# Patient Record
Sex: Female | Born: 1965
Health system: Southern US, Community
[De-identification: ages and names within clinical notes are randomized; demographics above are authoritative.]

## PROBLEM LIST (undated history)

## (undated) DIAGNOSIS — F32A Depression, unspecified: Secondary | ICD-10-CM

## (undated) DIAGNOSIS — E669 Obesity, unspecified: Secondary | ICD-10-CM

## (undated) DIAGNOSIS — I509 Heart failure, unspecified: Secondary | ICD-10-CM

## (undated) DIAGNOSIS — F988 Other specified behavioral and emotional disorders with onset usually occurring in childhood and adolescence: Secondary | ICD-10-CM

## (undated) DIAGNOSIS — R778 Other specified abnormalities of plasma proteins: Secondary | ICD-10-CM

## (undated) DIAGNOSIS — I10 Essential (primary) hypertension: Secondary | ICD-10-CM

## (undated) DIAGNOSIS — J40 Bronchitis, not specified as acute or chronic: Secondary | ICD-10-CM

## (undated) DIAGNOSIS — E119 Type 2 diabetes mellitus without complications: Secondary | ICD-10-CM

## (undated) DIAGNOSIS — R7989 Other specified abnormal findings of blood chemistry: Secondary | ICD-10-CM

## (undated) DIAGNOSIS — F329 Major depressive disorder, single episode, unspecified: Secondary | ICD-10-CM

## (undated) HISTORY — DX: Depression, unspecified: F32.A

## (undated) HISTORY — DX: Other specified behavioral and emotional disorders with onset usually occurring in childhood and adolescence: F98.8

## (undated) HISTORY — DX: Major depressive disorder, single episode, unspecified: F32.9

## (undated) HISTORY — DX: Essential (primary) hypertension: I10

---

## 1997-12-23 ENCOUNTER — Other Ambulatory Visit: Admission: RE | Admit: 1997-12-23 | Discharge: 1997-12-23 | Payer: Self-pay | Admitting: Gynecology

## 1998-02-16 ENCOUNTER — Encounter: Admission: RE | Admit: 1998-02-16 | Discharge: 1998-05-17 | Payer: Self-pay | Admitting: Gynecology

## 1998-05-17 ENCOUNTER — Encounter: Admission: RE | Admit: 1998-05-17 | Discharge: 1998-08-15 | Payer: Self-pay | Admitting: Gynecology

## 1998-05-24 ENCOUNTER — Ambulatory Visit (HOSPITAL_COMMUNITY): Admission: RE | Admit: 1998-05-24 | Discharge: 1998-05-24 | Payer: Self-pay | Admitting: Obstetrics and Gynecology

## 1998-07-11 ENCOUNTER — Inpatient Hospital Stay (HOSPITAL_COMMUNITY): Admission: AD | Admit: 1998-07-11 | Discharge: 1998-07-14 | Payer: Self-pay | Admitting: Gynecology

## 1998-07-14 ENCOUNTER — Encounter (HOSPITAL_COMMUNITY): Admission: RE | Admit: 1998-07-14 | Discharge: 1998-10-12 | Payer: Self-pay | Admitting: *Deleted

## 1998-08-23 ENCOUNTER — Other Ambulatory Visit: Admission: RE | Admit: 1998-08-23 | Discharge: 1998-08-23 | Payer: Self-pay | Admitting: Obstetrics and Gynecology

## 1998-10-13 ENCOUNTER — Encounter (HOSPITAL_COMMUNITY): Admission: RE | Admit: 1998-10-13 | Discharge: 1999-01-11 | Payer: Self-pay | Admitting: *Deleted

## 2000-08-15 ENCOUNTER — Other Ambulatory Visit: Admission: RE | Admit: 2000-08-15 | Discharge: 2000-08-15 | Payer: Self-pay | Admitting: *Deleted

## 2001-03-18 ENCOUNTER — Encounter: Admission: RE | Admit: 2001-03-18 | Discharge: 2001-06-16 | Payer: Self-pay | Admitting: Gynecology

## 2001-06-27 ENCOUNTER — Inpatient Hospital Stay (HOSPITAL_COMMUNITY): Admission: AD | Admit: 2001-06-27 | Discharge: 2001-06-30 | Payer: Self-pay | Admitting: *Deleted

## 2001-06-27 ENCOUNTER — Encounter (INDEPENDENT_AMBULATORY_CARE_PROVIDER_SITE_OTHER): Payer: Self-pay | Admitting: Specialist

## 2001-10-01 ENCOUNTER — Other Ambulatory Visit: Admission: RE | Admit: 2001-10-01 | Discharge: 2001-10-01 | Payer: Self-pay | Admitting: Gynecology

## 2006-05-24 ENCOUNTER — Other Ambulatory Visit: Admission: RE | Admit: 2006-05-24 | Discharge: 2006-05-24 | Payer: Self-pay | Admitting: Gynecology

## 2007-06-13 ENCOUNTER — Other Ambulatory Visit: Admission: RE | Admit: 2007-06-13 | Discharge: 2007-06-13 | Payer: Self-pay | Admitting: Gynecology

## 2009-01-09 ENCOUNTER — Emergency Department (HOSPITAL_COMMUNITY): Admission: EM | Admit: 2009-01-09 | Discharge: 2009-01-09 | Payer: Self-pay | Admitting: Emergency Medicine

## 2009-01-09 IMAGING — CR DG ANKLE COMPLETE 3+V*R*
3 series · 3 of 3 positions shown · non-contrast
Comparison: None available.

CLINICAL DATA: Fall, pain.

RIGHT ANKLE - COMPLETE 3+ VIEW

[t ankle joint ap right]
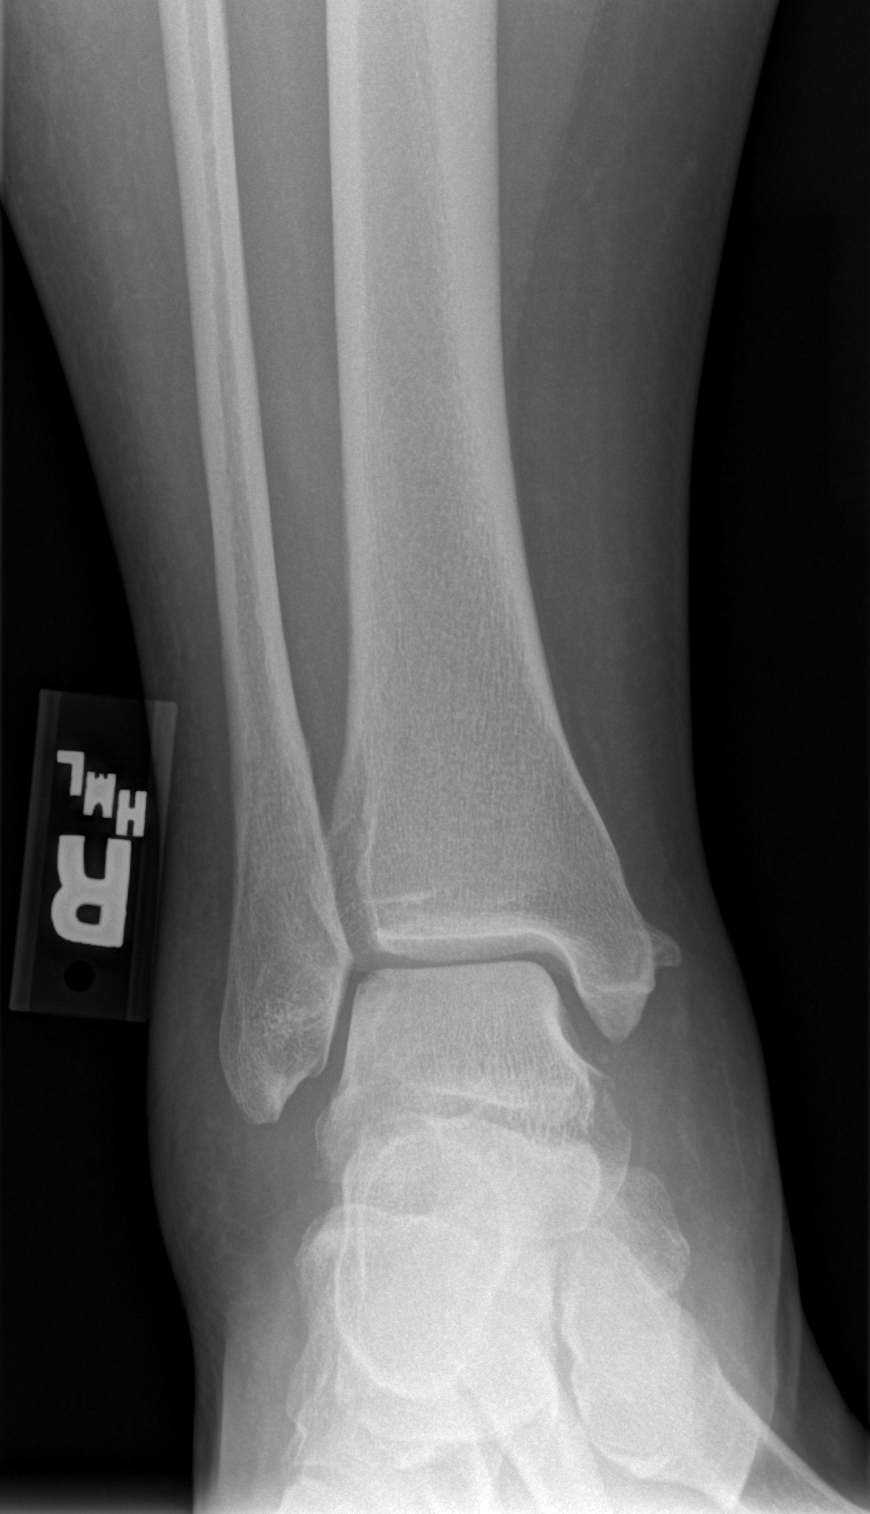

[t ankle joint oblique right]
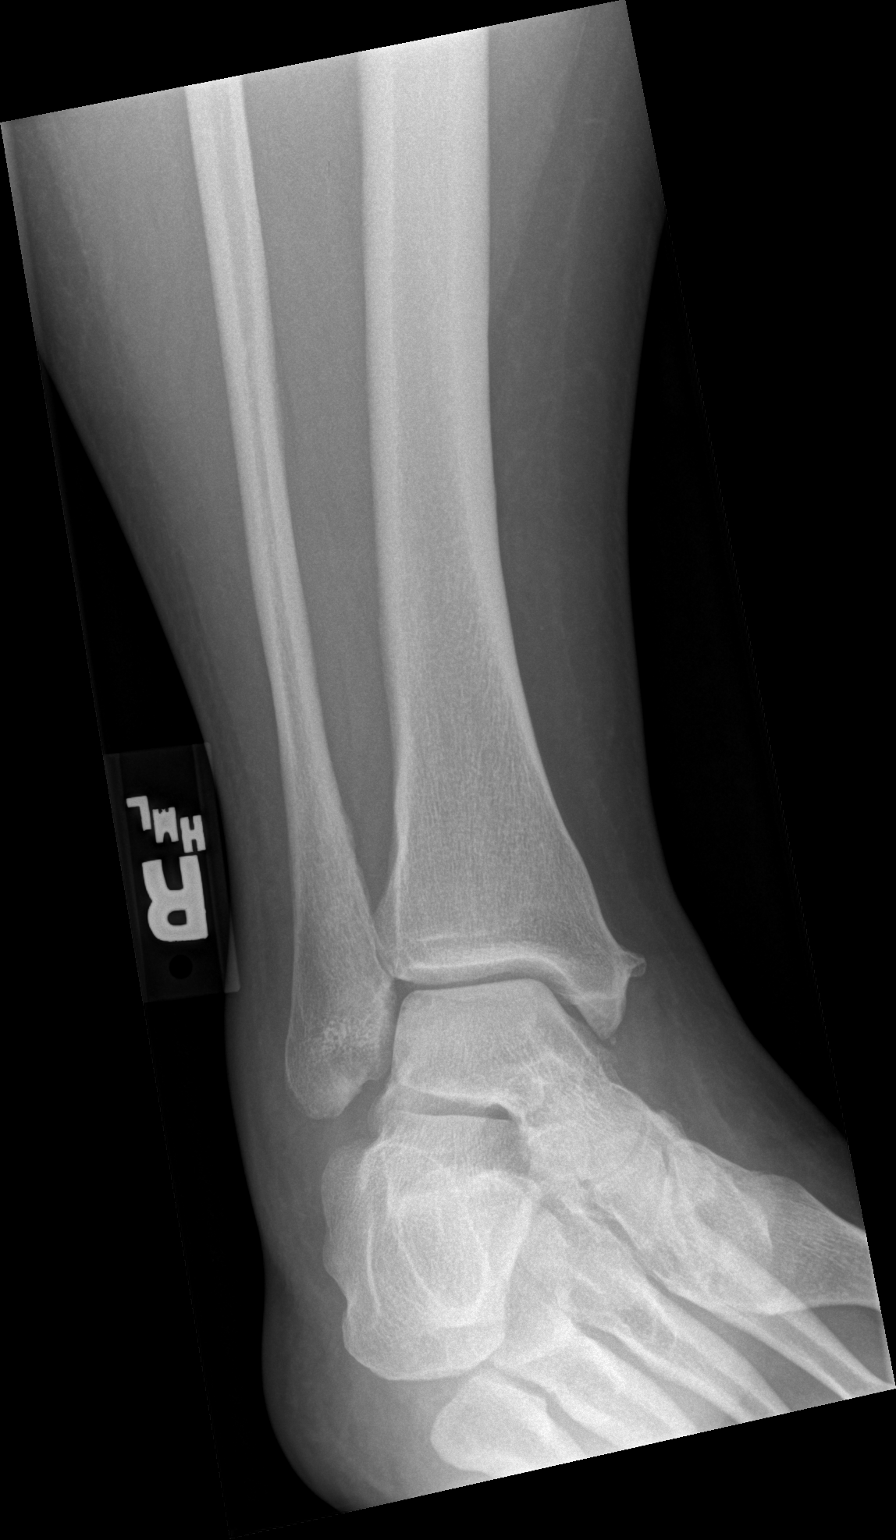

[t ankle joint lat right]
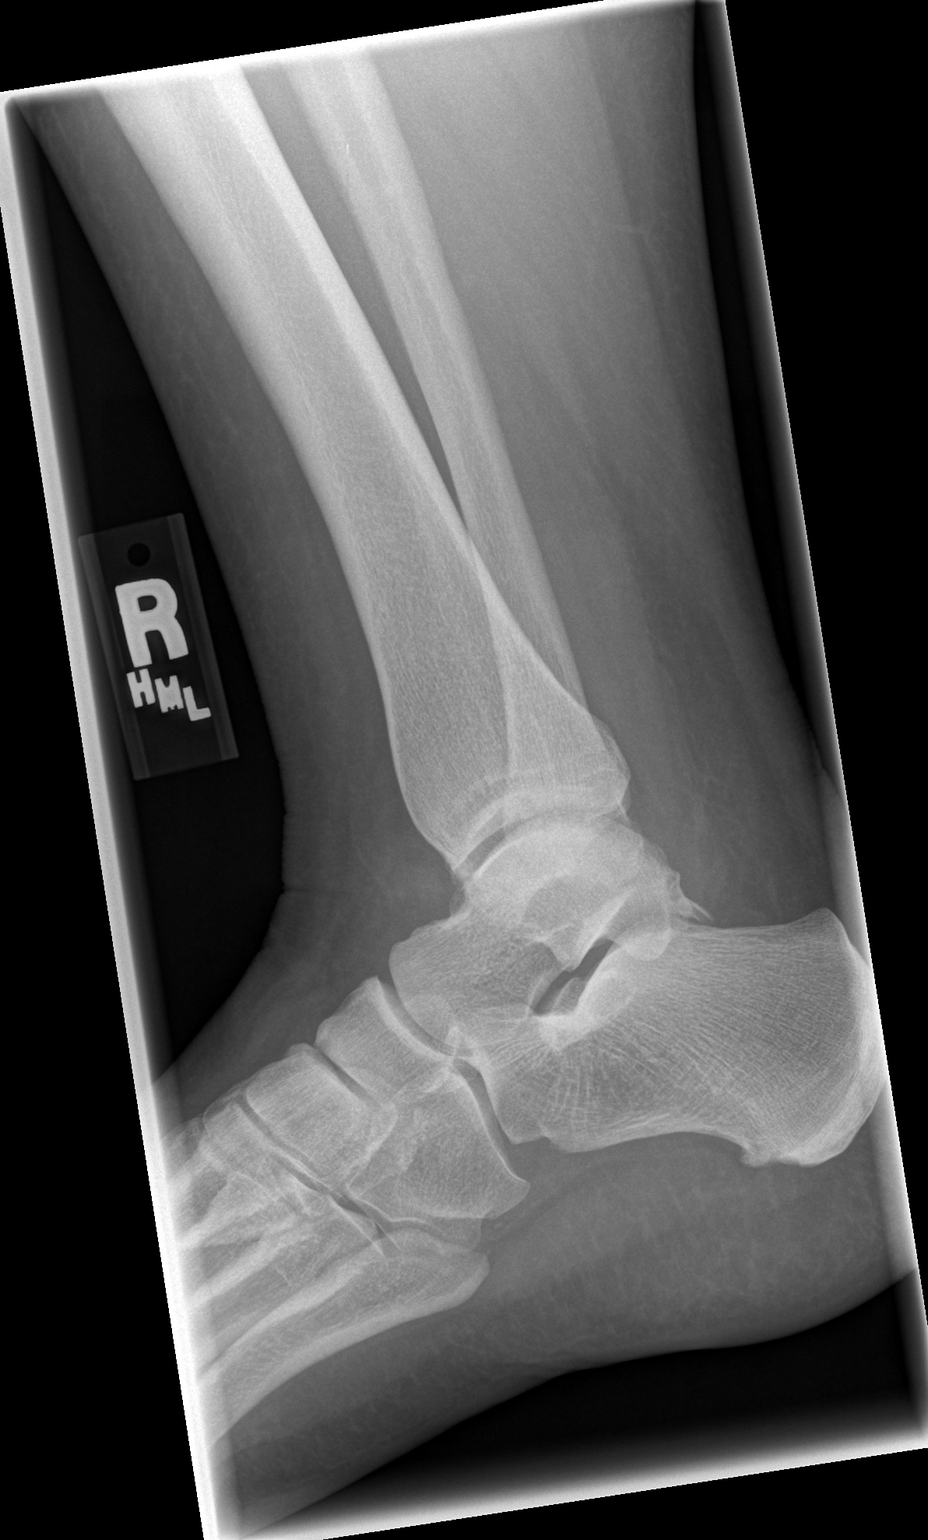

[3 of 3 positions shown; findings below may reference images not displayed]

FINDINGS: There is a focal lucency in the lateral talar dome
consistent with an osteochondral lesion, likely chronic.  Small
exostosis off the medial tibial metaphysis is noted.  No fracture
or dislocation.
IMPRESSION: 1.  Negative for fracture.
2.  Osteochondral lesion in the lateral talar dome is likely
chronic.

## 2009-01-09 IMAGING — CR DG KNEE COMPLETE 4+V*L*
4 series · 4 of 4 positions shown · non-contrast
Comparison: None available.

CLINICAL DATA: Fall, pain.

LEFT KNEE - COMPLETE 4+ VIEW

[t knee ap left]
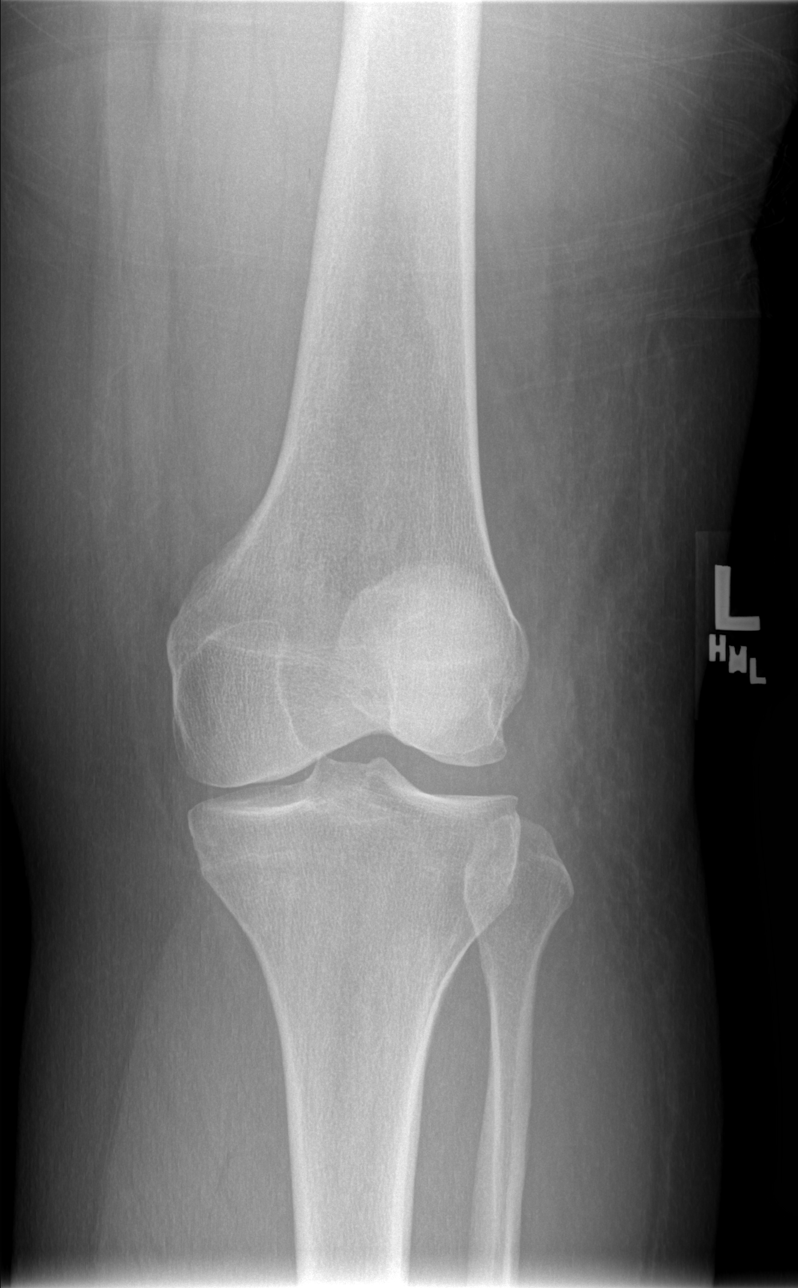

[t knee oblique left (1 of 2)]
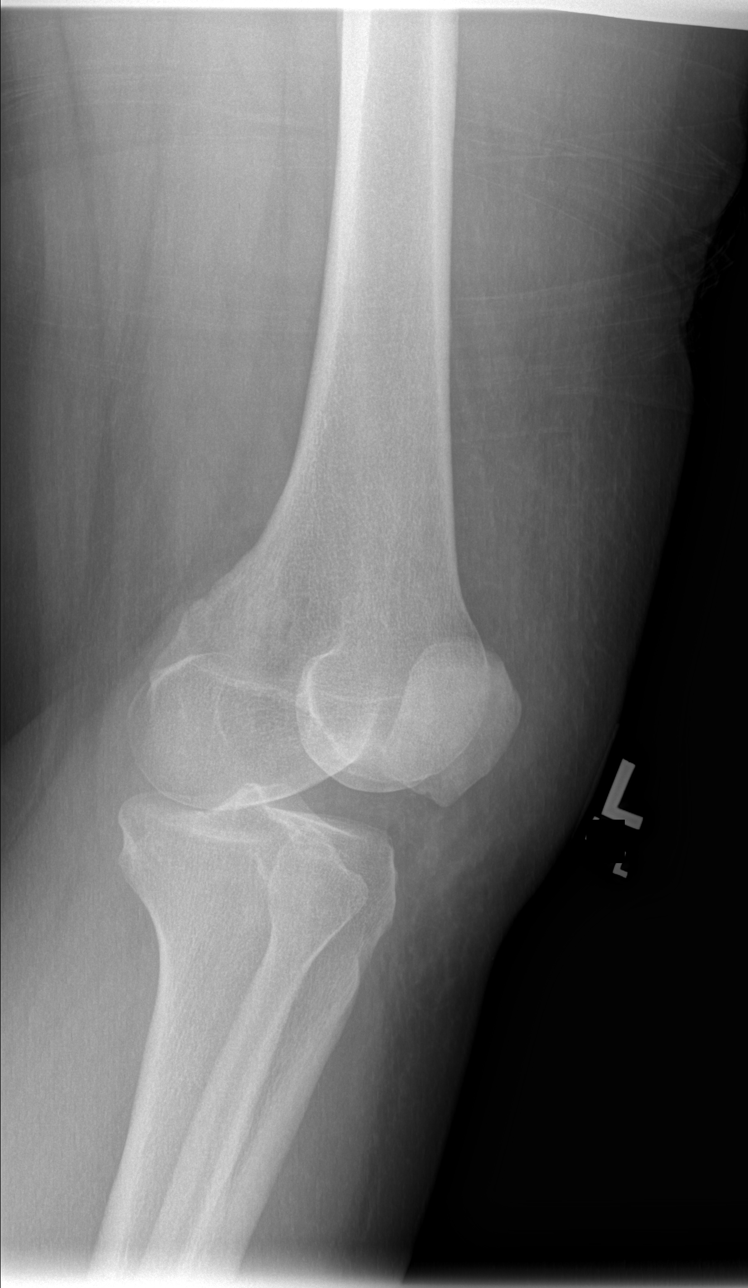

[t knee oblique left (2 of 2)]
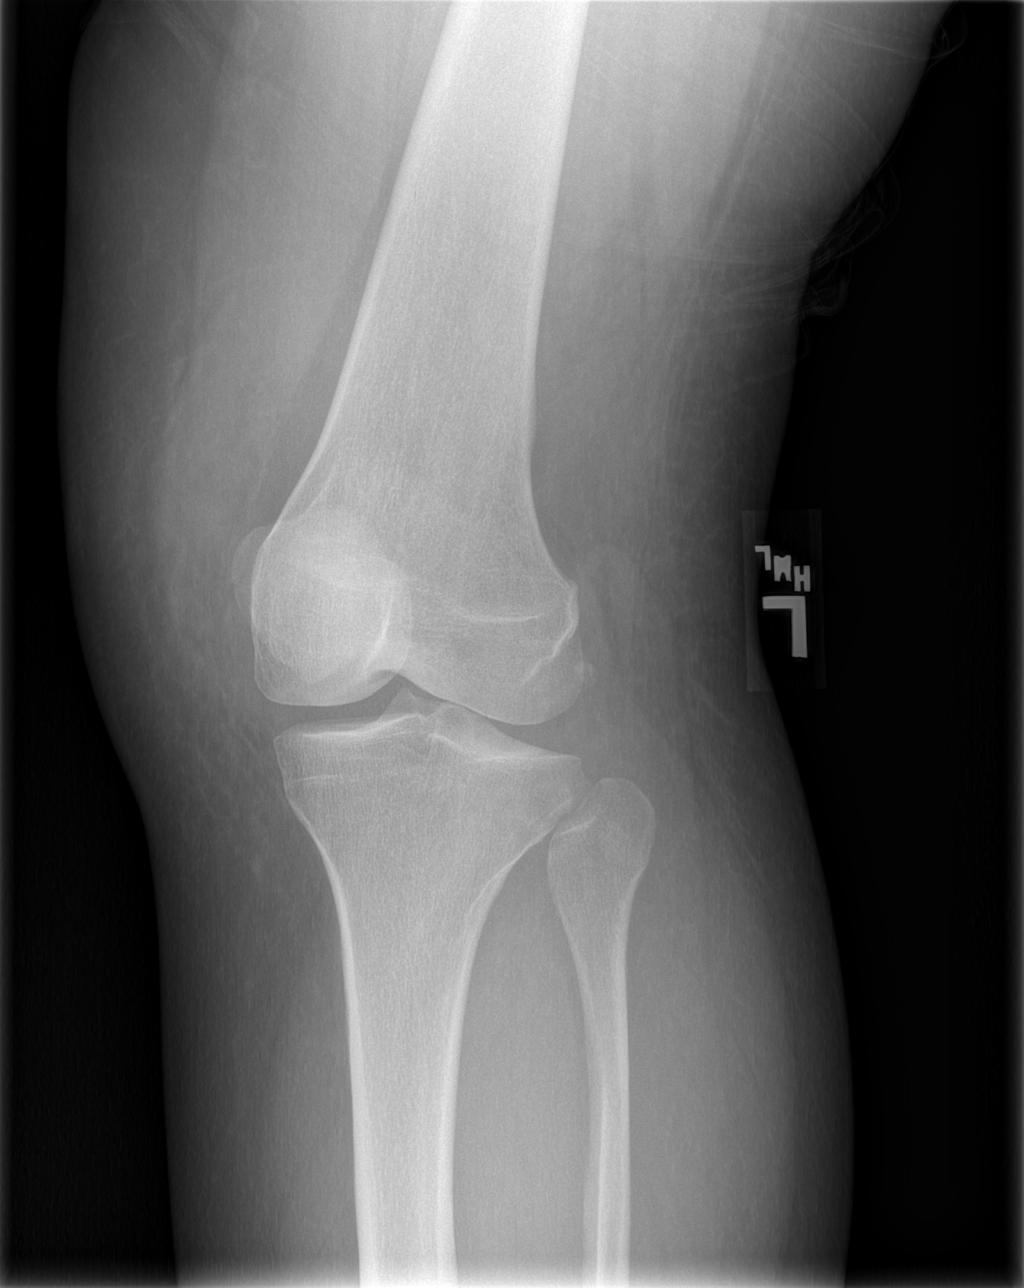

[t knee lat left]
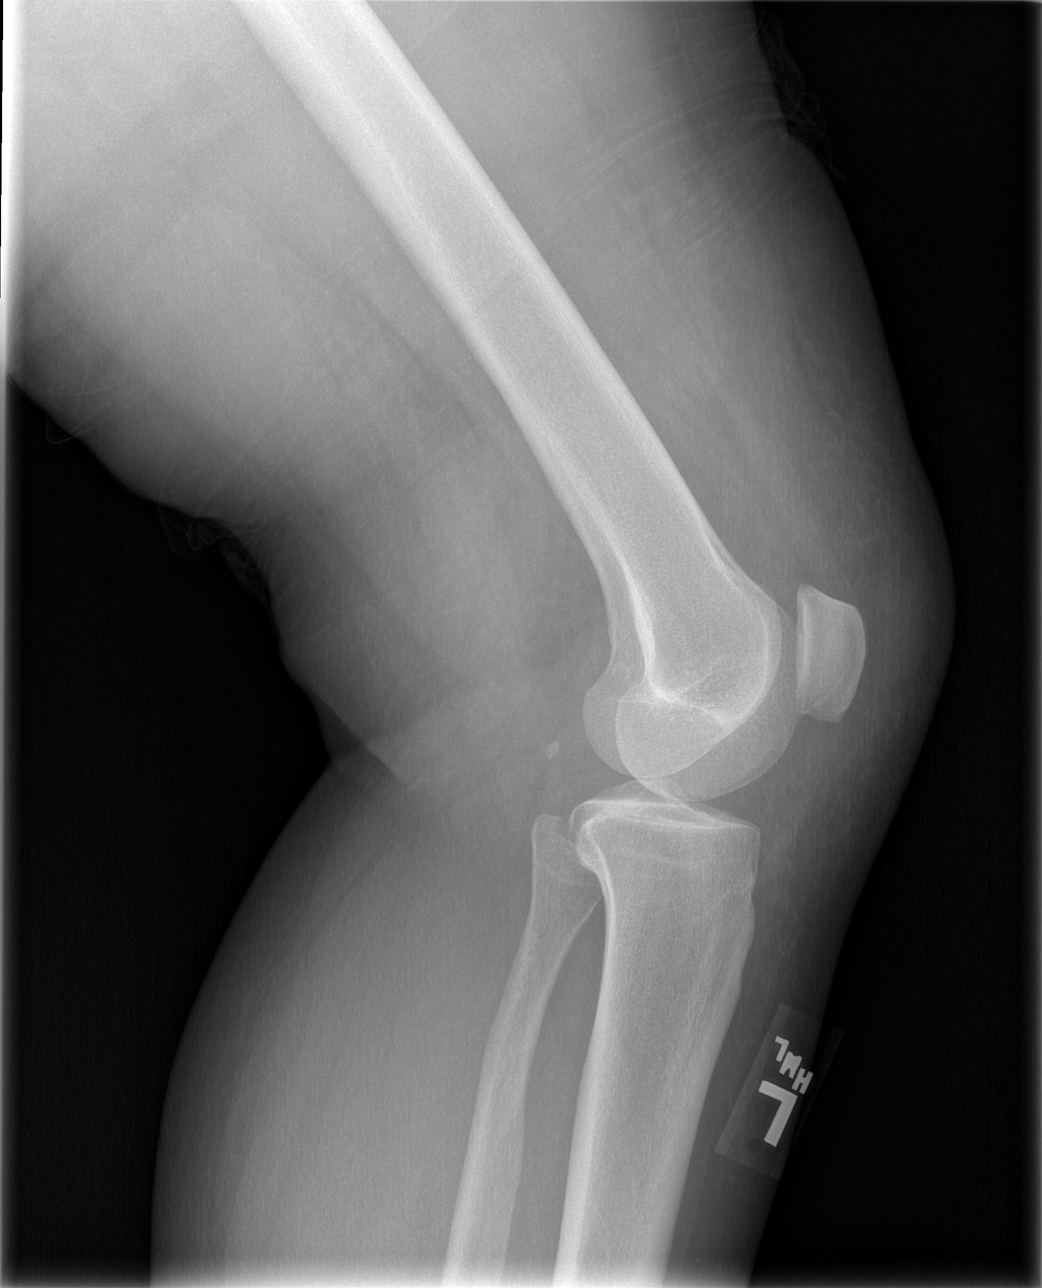

[4 of 4 positions shown; findings below may reference images not displayed]

FINDINGS: There is no fracture, dislocation or joint effusion.
There is some narrowing of the joint space of the medial
compartment compatible with degenerative change.
IMPRESSION: No acute finding.

## 2009-01-09 IMAGING — CT CT HEAD W/O CM
1 of 2 series · 16 of 30 positions shown, 20 images · non-contrast
Comparison: None available.

CLINICAL DATA: Fall, injury

CT HEAD WITHOUT CONTRAST
TECHNIQUE: Contiguous axial images were obtained from the base of
the skull through the vertex without contrast.

[Series 3: recon 2: brain · axial · 0.47mm/px · z∈[+137,+276]mm · 16 of 56 slices shown, 20 images]
[im 3/56  brain]
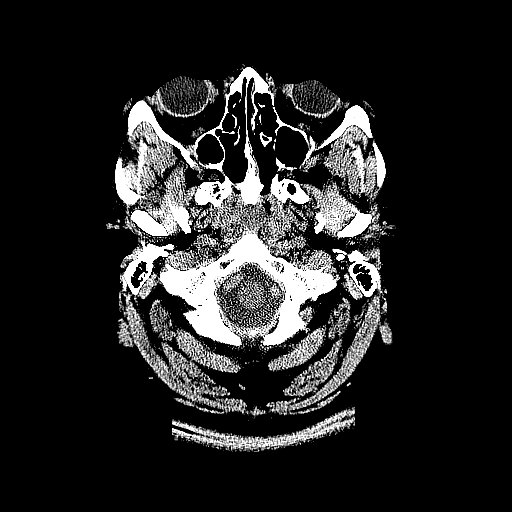
[im 3/56  bone]
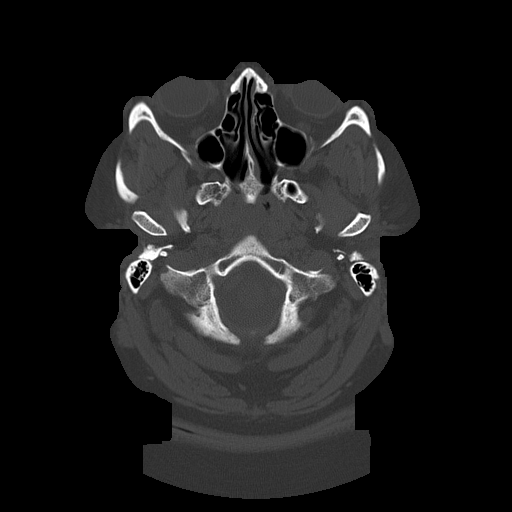
[im 6/56  brain]
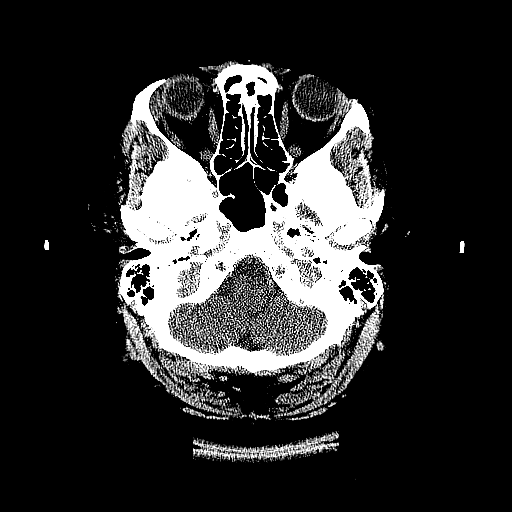
[im 9/56  brain]
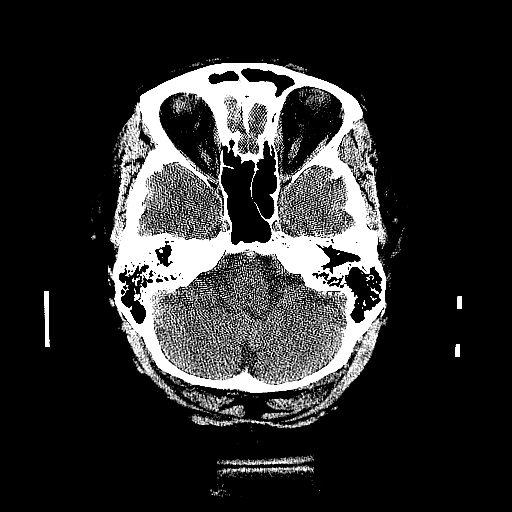
[im 12/56  brain]
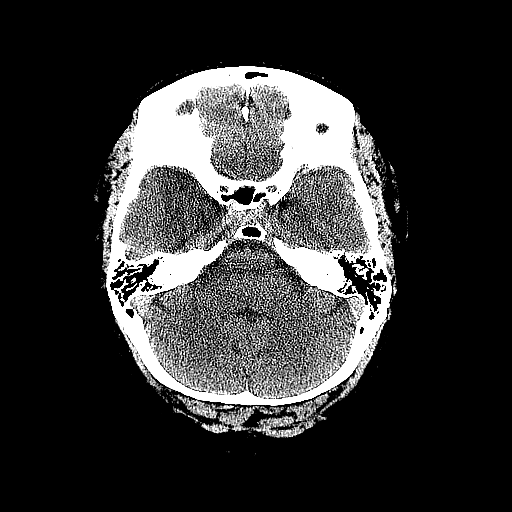
[im 18/56  brain]
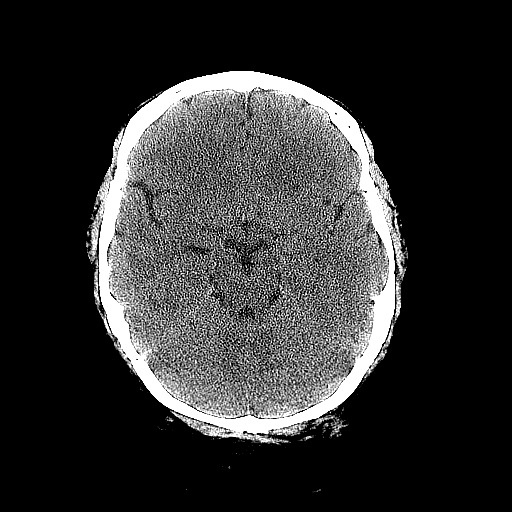
[im 18/56  bone]
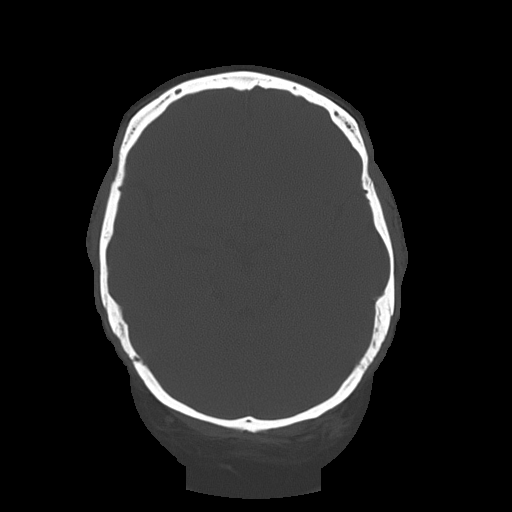
[im 21/56  brain]
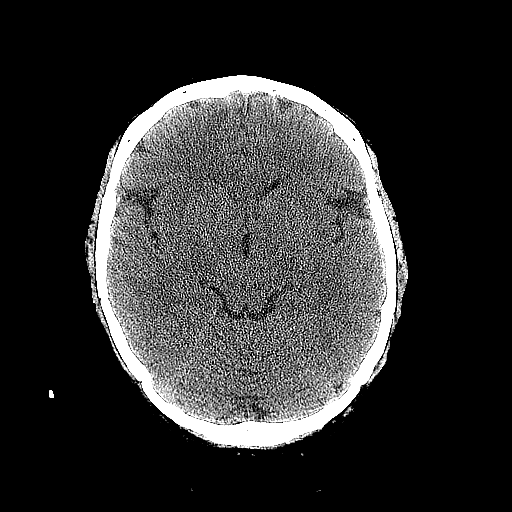
[im 24/56  brain]
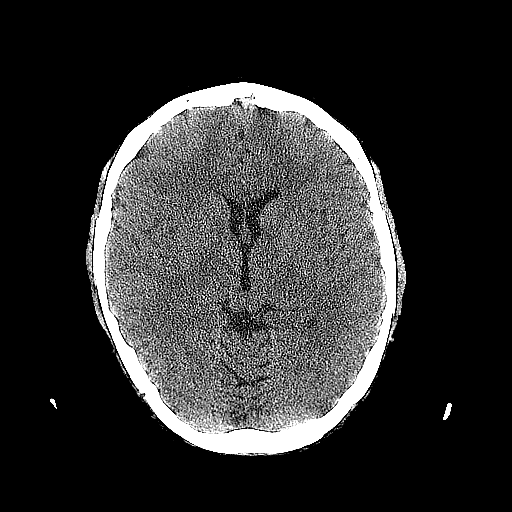
[im 27/56  brain]
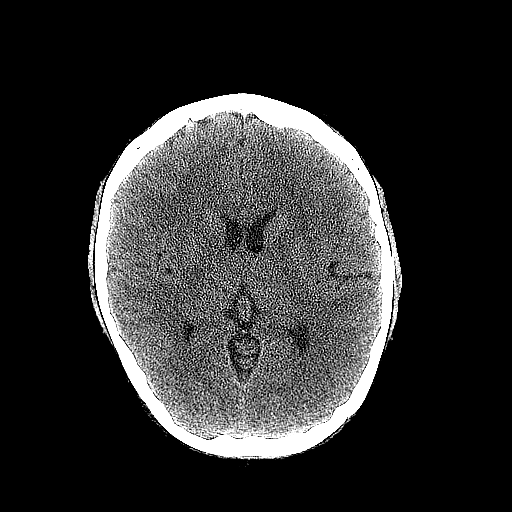
[im 29/56  brain]
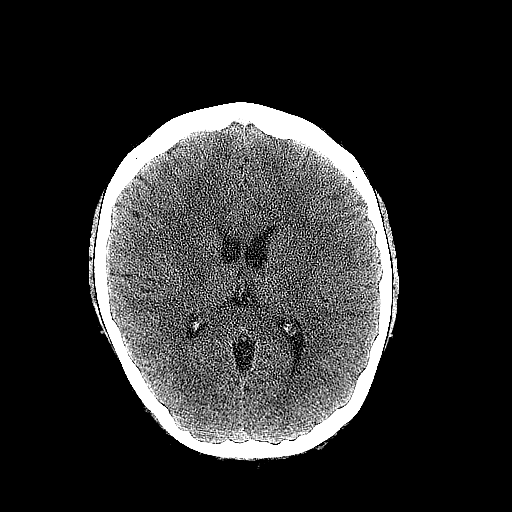
[im 29/56  bone]
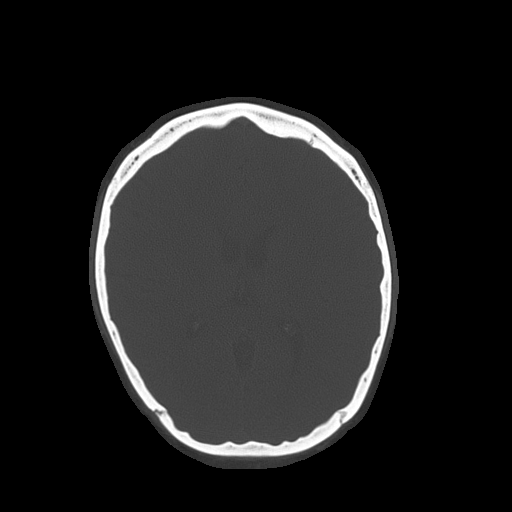
[im 32/56  brain]
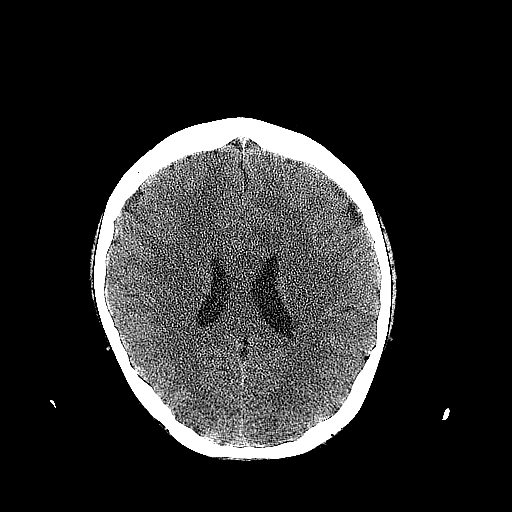
[im 35/56  brain]
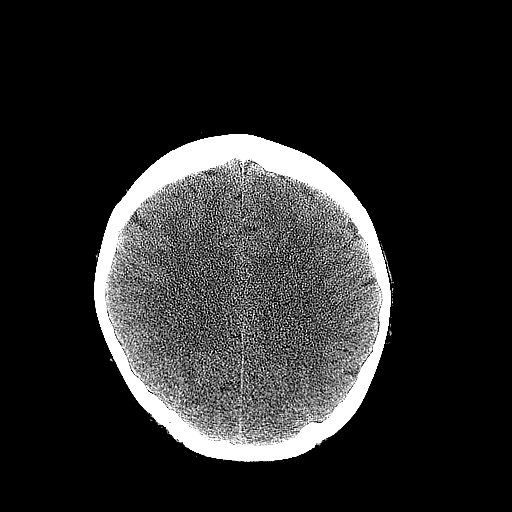
[im 38/56  brain]
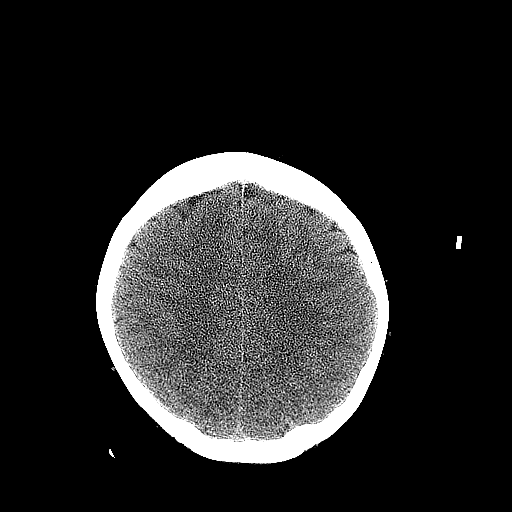
[im 44/56  brain]
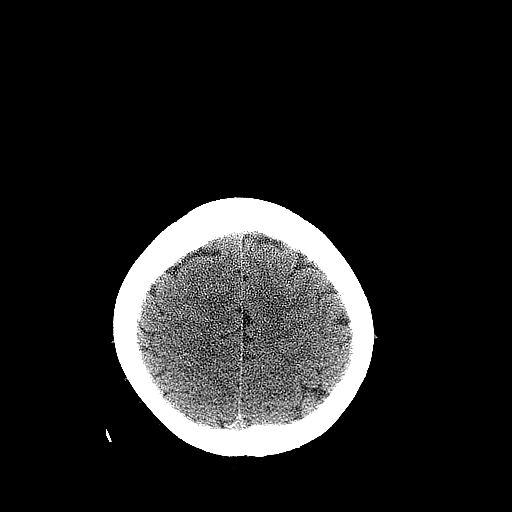
[im 44/56  bone]
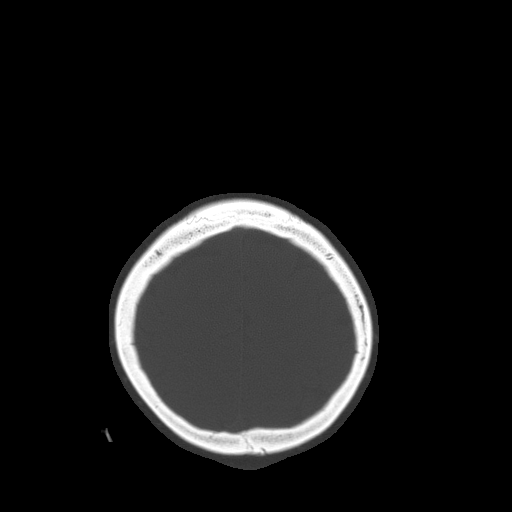
[im 47/56  brain]
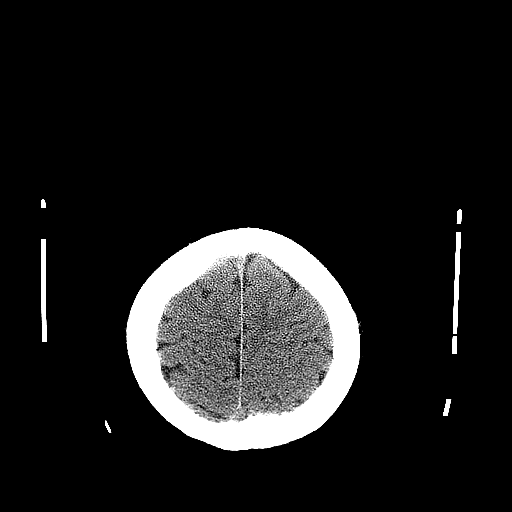
[im 50/56  brain]
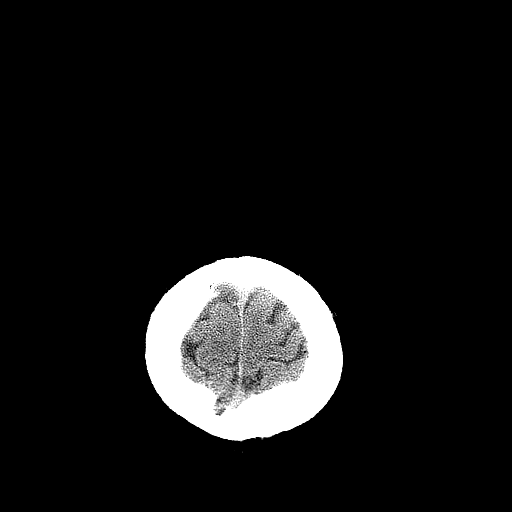
[im 53/56  brain]
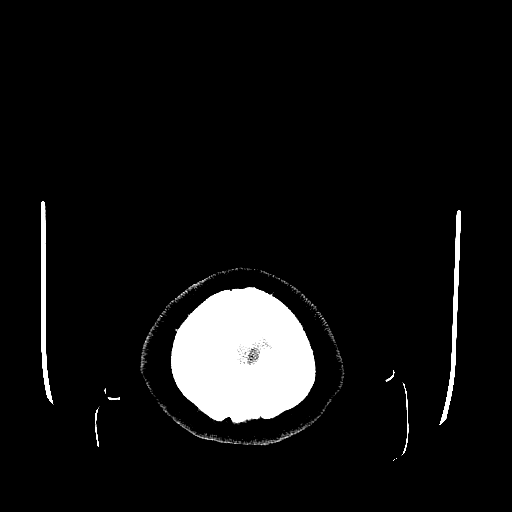

[16 of 30 positions shown; findings below may reference images not displayed]

FINDINGS: There is no evidence of acute intracranial abnormality
including acute infarction, hemorrhage, mass lesion, mass effect,
midline shift or abnormal extra-axial fluid collection.  No
hydrocephalus.  Imaged paranasal sinuses and mastoid air cells are
clear.
IMPRESSION: No acute finding.

## 2010-03-19 ENCOUNTER — Emergency Department (HOSPITAL_COMMUNITY)
Admission: EM | Admit: 2010-03-19 | Discharge: 2010-03-19 | Payer: Self-pay | Source: Home / Self Care | Admitting: Emergency Medicine

## 2010-03-19 IMAGING — CT CT ABD-PELV W/O CM
2 of 4 series · 17 of 46 positions shown, 19 images · non-contrast
Comparison: None.

CLINICAL DATA: Acute left flank pain, hematuria

CT ABDOMEN AND PELVIS WITHOUT CONTRAST
TECHNIQUE: Multidetector CT imaging of the abdomen and pelvis was
performed following the standard protocol without intravenous
contrast.

[Series 2: a/p w/o 5.0 b31f st · axial · non-contrast · 0.95mm/px · z∈[-510,-74]mm · 14 of 95 slices shown, 16 images]
[im 4/95  soft-tissue]
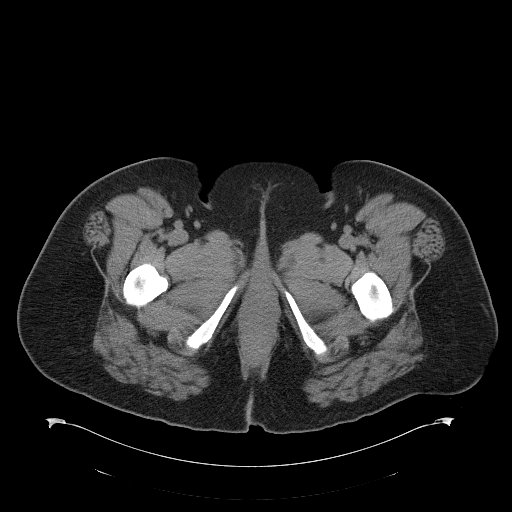
[im 4/95  bone]
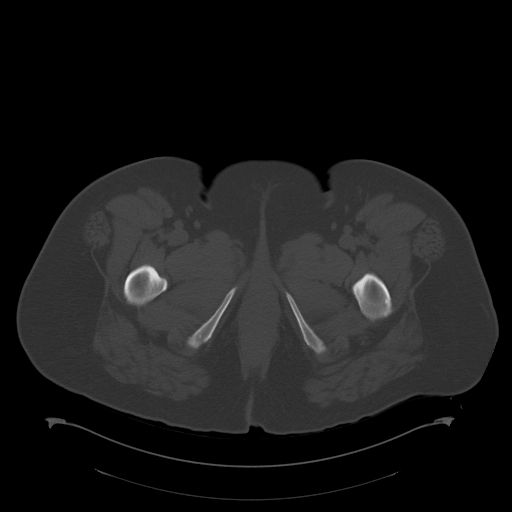
[im 12/95  soft-tissue]
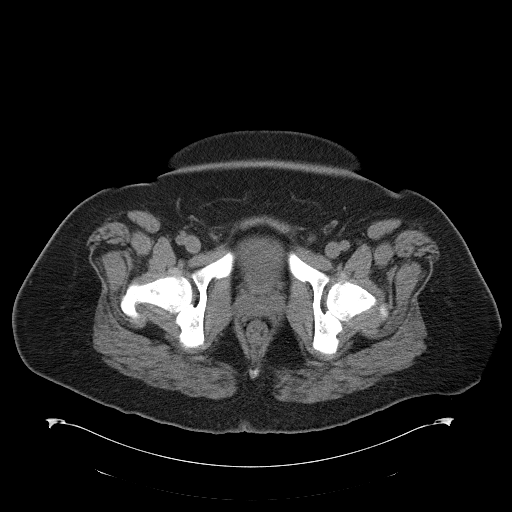
[im 20/95  soft-tissue]
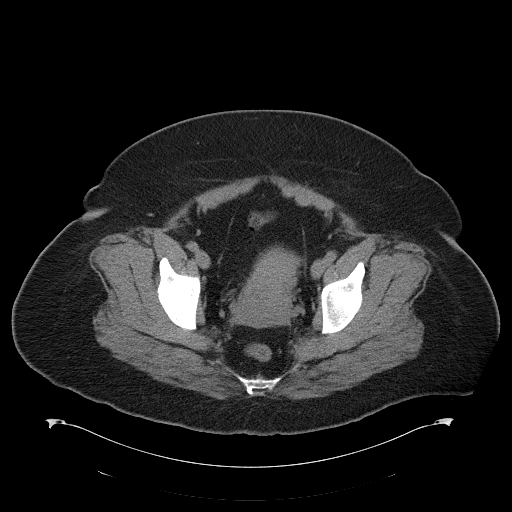
[im 24/95  soft-tissue]
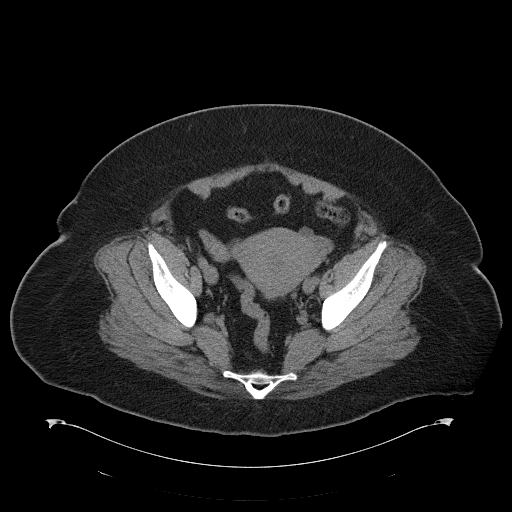
[im 32/95  soft-tissue]
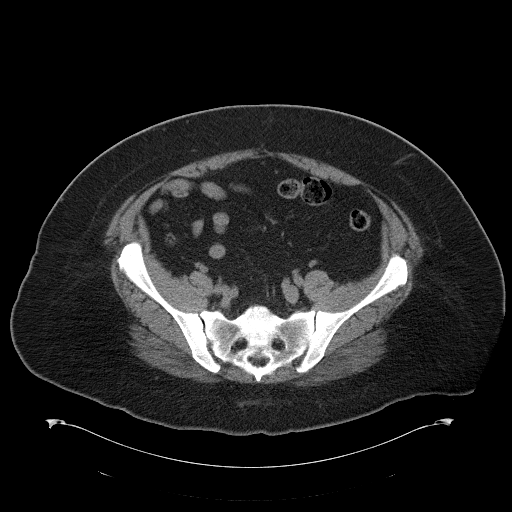
[im 40/95  soft-tissue]
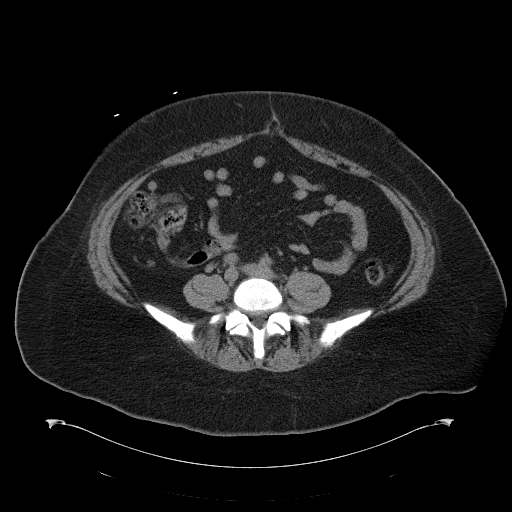
[im 44/95  soft-tissue]
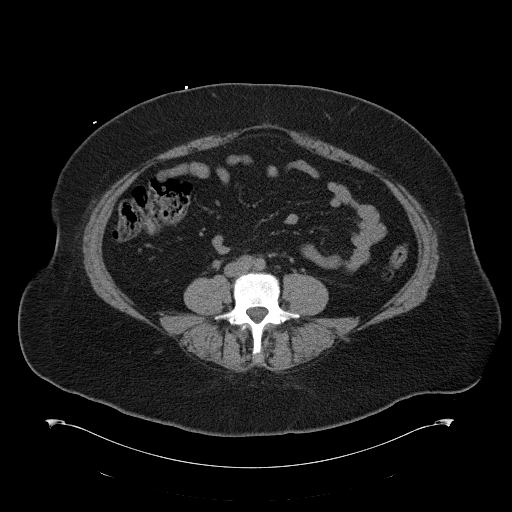
[im 51/95  soft-tissue]
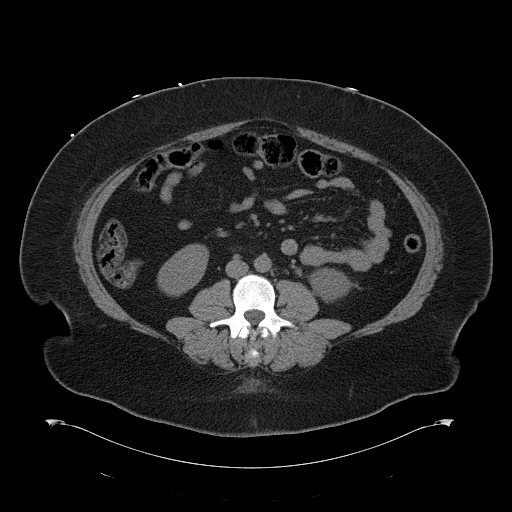
[im 55/95  soft-tissue]
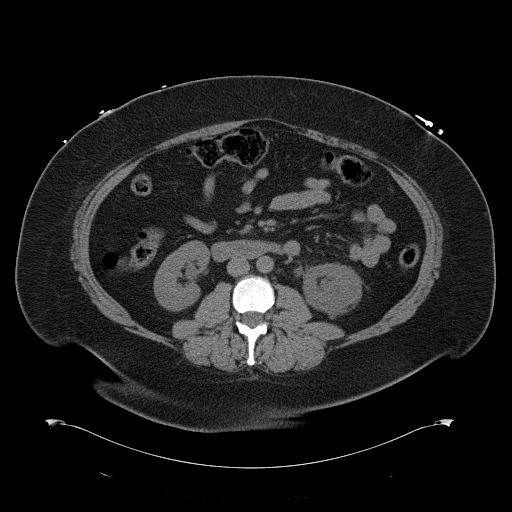
[im 55/95  bone]
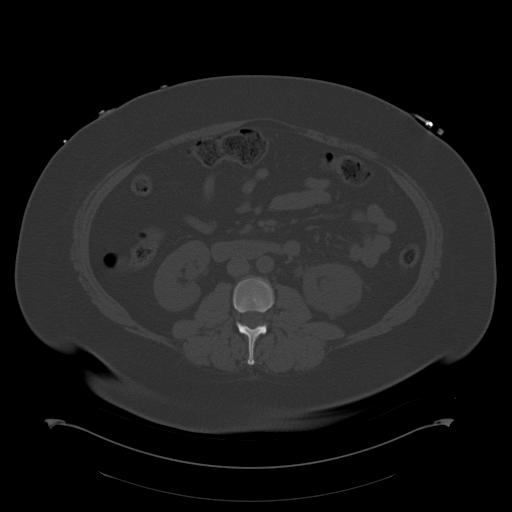
[im 63/95  soft-tissue]
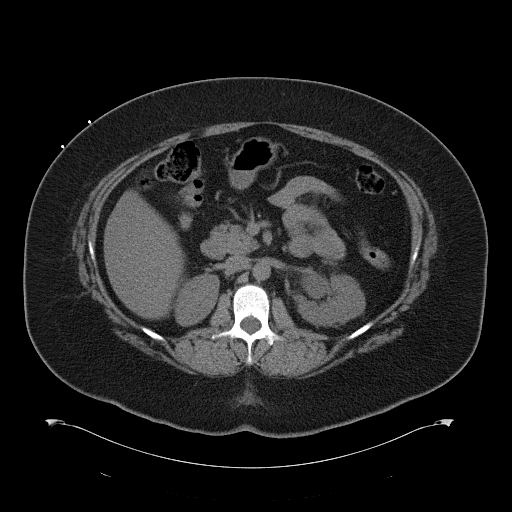
[im 71/95  soft-tissue]
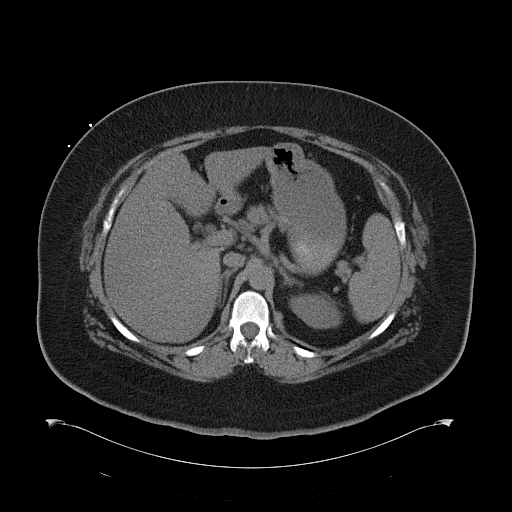
[im 75/95  soft-tissue]
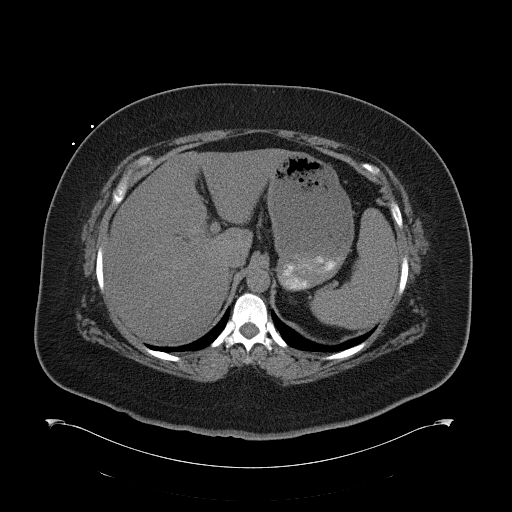
[im 83/95  soft-tissue]
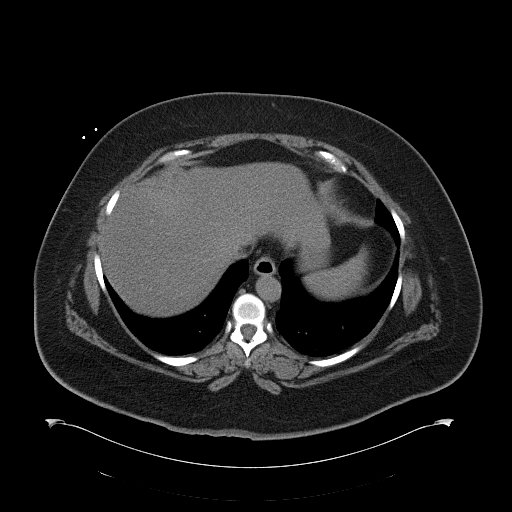
[im 91/95  soft-tissue]
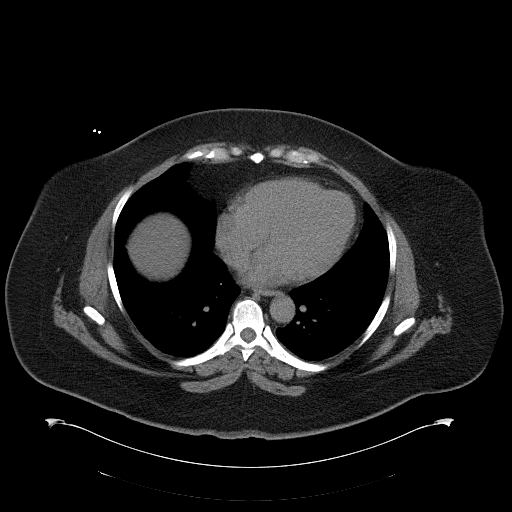

[Series 602: cor · coronal · 0.95mm/px · 3 of 97 slices shown]
[im 33/97  soft-tissue]
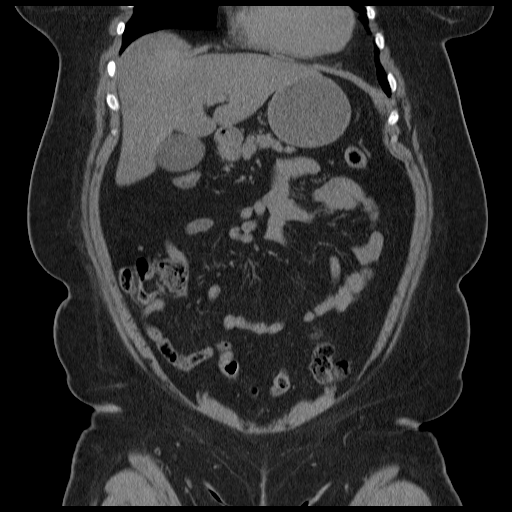
[im 43/97  soft-tissue]
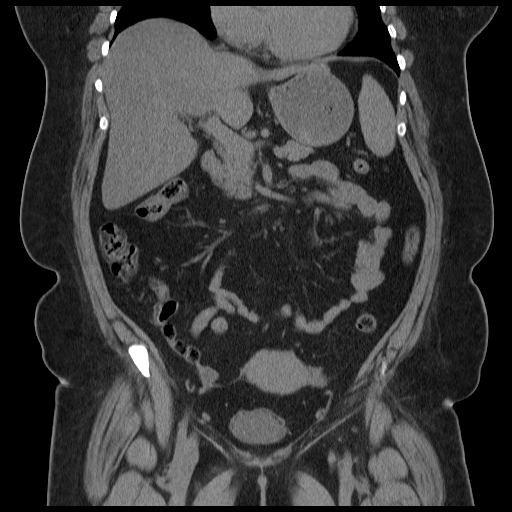
[im 54/97  soft-tissue]
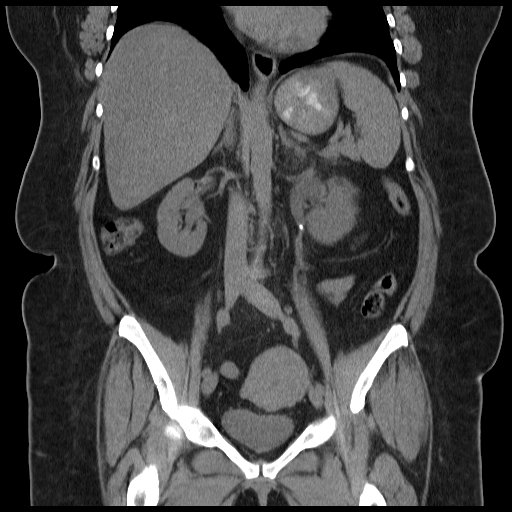

[17 of 46 positions shown; findings below may reference images not displayed]

FINDINGS: The lung bases are clear.  The liver is somewhat
inhomogeneous and low in attenuation consistent with fatty
infiltration.  No focal abnormality is seen.  No calcified
gallstones are noted.  The pancreas is normal in size and the
pancreatic duct is not dilated.  The adrenal glands and spleen are
unremarkable.  The stomach is moderately fluid distended and
unremarkable.  No definite renal calculi are seen.  However, there
is a moderate left hydronephrosis present caused by a 4 mm proximal
left ureteral calculus at the left U P junction.  The more distal
ureters are normal in caliber.

The urinary bladder is unremarkable.  The uterus is normal in size.
Small ovarian follicles are present.  No significant free fluid is
seen within the pelvis.  No bony abnormality is seen.
IMPRESSION: 1.  Moderate left hydronephrosis caused by a 4 mm proximal left
ureteral calculus at the left U P junction.  No other renal calculi
are seen.
2.  Fatty infiltration of the liver.

## 2010-06-27 LAB — COMPREHENSIVE METABOLIC PANEL
ALT: 32 U/L (ref 0–35)
Albumin: 3.8 g/dL (ref 3.5–5.2)
Alkaline Phosphatase: 79 U/L (ref 39–117)
BUN: 13 mg/dL (ref 6–23)
Chloride: 93 mEq/L — ABNORMAL LOW (ref 96–112)
Potassium: 3.3 mEq/L — ABNORMAL LOW (ref 3.5–5.1)
Sodium: 127 mEq/L — ABNORMAL LOW (ref 135–145)
Total Bilirubin: 0.6 mg/dL (ref 0.3–1.2)

## 2010-06-27 LAB — URINE MICROSCOPIC-ADD ON

## 2010-06-27 LAB — URINE CULTURE

## 2010-06-27 LAB — POCT PREGNANCY, URINE: Preg Test, Ur: NEGATIVE

## 2010-06-27 LAB — CBC
HCT: 42.9 % (ref 36.0–46.0)
MCV: 91.1 fL (ref 78.0–100.0)
Platelets: 193 10*3/uL (ref 150–400)
RBC: 4.71 MIL/uL (ref 3.87–5.11)
WBC: 8.6 10*3/uL (ref 4.0–10.5)

## 2010-06-27 LAB — DIFFERENTIAL
Basophils Absolute: 0 10*3/uL (ref 0.0–0.1)
Basophils Relative: 0 % (ref 0–1)
Eosinophils Absolute: 0 10*3/uL (ref 0.0–0.7)
Eosinophils Relative: 0 % (ref 0–5)
Monocytes Absolute: 0.3 10*3/uL (ref 0.1–1.0)
Monocytes Relative: 3 % (ref 3–12)
Neutro Abs: 7.4 10*3/uL (ref 1.7–7.7)

## 2010-06-27 LAB — URINALYSIS, ROUTINE W REFLEX MICROSCOPIC
Glucose, UA: NEGATIVE mg/dL
Specific Gravity, Urine: 1.018 (ref 1.005–1.030)

## 2010-09-01 NOTE — Discharge Summary (Signed)
[facility]  Patient:    Connie Shepard, Connie Shepard Visit Number: [id number] MRN: [id number]          Service Type: OBS Location: 910A [id number] [id number] Attending Physician:  Wetzel Bjornstad Dictated by:   Antony Contras, [facility] Admit Date:  06/27/2001 Discharge Date: 06/30/2001                             Discharge Summary  DISCHARGE DIAGNOSES:          1. Thirty-nine week week intrauterine pregnancy.                               2. A1 diabetes.                               3. Failure to descend.  PROCEDURE PERFORMED:          Primary low cervical transverse cesarean                               section.  HISTORY OF PRESENT ILLNESS:   The patient is a 45 year old, Gravida 2, Para 1-0-0-1, with an LMP of September 27, 2000, [facility] July 04, 2001.  Prenatal risk factors include a history of depression and history of gestational diabetes.  PRENATAL LABS:                Blood type O positive, antibody screen negative. RPR, HBIG, and HIV nonreactive.  Rubella equivocal.  HOSPITAL COURSE AND TREATMENT:                    The patient was admitted on June 27, 2001 for induction of labor secondary to history of gestational diabetes.  Due to failure to progress, primary low cervical transverse cesarean section was performed by Dr. Penni Homans assisted by Dr. Henderson Cloud under epidural anesthesia. The patient was delivered of an Apgar 8 and 9, 8 pound 13 ounces female.  Three vessel cord.  Normal appearing uterus, tubes and ovaries.  Baby noted to be in ROT position on delivery.  Postpartum course was normal.  The patient remained afebrile.  She had no difficulty voiding.  She was able to be discharged in satisfactory condition on her third postoperative day.  LABORATORY DATA:              Hemoglobin 9.9.  DISPOSITION:                  Follow up in six weeks.  Continue prenatal vitamins and iron.  Motrin and Tylox for pain. Dictated by:   Antony Contras, [facility] Attending Physician:   Wetzel Bjornstad DD:  07/16/01 TD:  07/17/01 Job: [id number]

## 2010-09-01 NOTE — Op Note (Signed)
[facility]  Patient:    Connie Shepard, Connie Shepard Visit Number: [id number] MRN: [id number]          Service Type: Attending:  Katy Fitch, M.D. Dictated by:   Katy Fitch, M.D. Proc. Date: 06/27/01                             Operative Report  PREOPERATIVE DIAGNOSES:       1. A 39-week intrauterine pregnancy.                               2. A1 diabetes.                               3. Failure to descend.  POSTOPERATIVE DIAGNOSES:      1. A 39-week intrauterine pregnancy.                               2. A1 diabetes.                               3. Failure to descend.  PROCEDURE:                    Primary low transverse cesarean section.  SURGEON:                      Katy Fitch, M.D.  ASSISTANT:                    Guy Sandifer. Ar[place]ta [place], M.D.  ANESTHESIA:                   Epidural.  ANESTHESIOLOGIST:             Ellison Hughs., M.D.  ESTIMATED BLOOD LOSS:         800.  URINE OUTPUT:                 100 clear.  FLUIDS:                       900 crystalloid.  COMPLICATIONS:                None.  SPECIMENS:                    Cord blood.  FINDINGS:                     Living 8 lb 13 oz female with Apgars of 8 and 9 with three-vessel cord.  Normal-appearing uterus, tubes and ovaries.  The baby was noted to be in an ROT position on delivery.  DESCRIPTION OF PROCEDURE:     The patient was taken to the operating room after consents were discussed with the patient, discussing her failure to descend.  The patient was rebolused in her epidural and noted to be adequate. The patient was prepped and draped in the normal sterile fashion.  A Pfannenstiel incision was made and carried down to the underlying fascia, which was excised and extended transversely using curved Mayo scissors.  The superior fascial border was then grasped with Kocher clamps and the underlying rectus muscles dissected  off both sharply and bluntly.  This was  done similarly in the inferior fascial border.  The rectus muscles were then separated in the midline manually and the abdominal peritoneum was entered bluntly.  A bladder blade was then placed into the pelvis and the vesicouterine peritoneum identified and entered sharply with Metzenbaum scissors and extended transversely with Metzenbaum scissors.  A bladder flap was then created digitally and the bladder blade was reintroduced into the newly created bladder flap.  A low transverse uterine incision was made with scalpel and then extended manually.  Clear fluid was noted.  The infants head was then delivered.  The mouth and nose were suctioned, then the rest of the body was delivered.  The infants cord was clamped and cut and the infant was handed to the waiting NICU attendants.  Cord blood was obtained and the placenta was incised from the uterus.  The uterus was exteriorized and cleared of all clots and debris.  A running 1-0 chromic interlocking stitch was used to close the lower uterine segment.  A second layer was used to make the incision hemostatic.  The posterior cul-de-sac was then irrigated and cleared of all clots and debris.  The uterus was returned to the abdominal cavity and irrigation of the pelvis was then once again noted.  There was one area on the left side of the uterine incision which was bleeding.  A figure-of-eight suture was placed in and noted to be hemostatic after suturing.  The muscle and peritoneum were closed in three interrupted stitches of #1 chromic.  The fascia was then closed using 0 Vicryl in a running continuous stitch.  The subcutaneous tissue was irrigated and several layers were cauterized with the Bovie.  The skin was then closed using staples.  The patient was taken to the recovery room in stable condition. Dictated by:   Katy Fitch, M.D. Attending:  Katy Fitch, M.D. DD:  06/27/01 TD:  06/28/01 Job: [id number]

## 2010-09-01 NOTE — H&P (Signed)
[facility]  Patient:    Connie Shepard, Connie Shepard Visit Number: [id number] MRN: [id number]          Service Type: OBS Location: [facility] Attending Physician:  Tonye Royalty Dictated by:   Katy Fitch, M.D. Adm. Date:  06/27/01                           History and Physical  CHIEF COMPLAINT:              Intrauterine pregnancy at 39 weeks, with gestational diabetes.  HISTORY OF PRESENT ILLNESS:   The patient is a 45 year old G2 P1, at 26 weeks, who will be admitted for induction secondary to gestational diabetes and favorable cervix.  The patient has been diet controlled throughout the pregnancy, has had antenatal testing with reactive stress test and normal amniotic fluid.  The patient has had well controlled sugars, and her cervix noted check in the office on June 24, 2001 was 2 cm, 80% effaced, and -2. The patient has also had depression, which has complicated her pregnancy, for which she has taken Zoloft 50 mg.  PAST MEDICAL HISTORY:         Depression.  PAST SURGICAL HISTORY:        None.  MEDICATIONS:                  1. Zoloft 50 mg one p.o. q.d.                               2. Prenatal vitamins.  ALLERGIES:                    No known drug allergies.  SOCIAL HISTORY:               Without any tobacco, alcohol, or drugs.  FAMILY HISTORY:               Without any mental retardation or epithelial cancers.  PHYSICAL EXAMINATION:  VITAL SIGNS:                  Blood pressure was 114/74.  HEENT:                        Throat clear.  LUNGS:                        Clear to auscultation bilaterally.  HEART:                        Regular rate and rhythm.  ABDOMEN:                      Gravid, nontender.  Estimated fetal weight 7 pounds 12 ounces.  PELVIC:                       Cervix 2, 80%, -2.  ASSESSMENT/PLAN:              This is a 45 year old gravida 2 para 1, at 39 weeks, with A1 diabetes and favorable cervix.  The  patient is group B streptococci positive.  Will admit for Pitocin induction and start on penicillin 5 million unit bolus, then 2.5 IV q.4h.  The patient will be admitted on June 27, 2001 for delivery. Dictated by:  Katy Fitch, M.D. Attending Physician:  Tonye Royalty DD:  06/24/01 TD:  06/24/01 Job: [id number]

## 2015-12-02 ENCOUNTER — Ambulatory Visit (INDEPENDENT_AMBULATORY_CARE_PROVIDER_SITE_OTHER): Payer: BC Managed Care – PPO | Admitting: Physician Assistant

## 2015-12-02 ENCOUNTER — Encounter: Payer: Self-pay | Admitting: Physician Assistant

## 2015-12-02 VITALS — BP 190/120 | HR 78 | Temp 98.6°F | Ht 63.75 in | Wt 270.4 lb

## 2015-12-02 DIAGNOSIS — I10 Essential (primary) hypertension: Secondary | ICD-10-CM | POA: Diagnosis not present

## 2015-12-02 DIAGNOSIS — G4700 Insomnia, unspecified: Secondary | ICD-10-CM | POA: Diagnosis not present

## 2015-12-02 DIAGNOSIS — F32A Depression, unspecified: Secondary | ICD-10-CM | POA: Insufficient documentation

## 2015-12-02 DIAGNOSIS — F900 Attention-deficit hyperactivity disorder, predominantly inattentive type: Secondary | ICD-10-CM | POA: Insufficient documentation

## 2015-12-02 DIAGNOSIS — F5101 Primary insomnia: Secondary | ICD-10-CM | POA: Insufficient documentation

## 2015-12-02 DIAGNOSIS — F329 Major depressive disorder, single episode, unspecified: Secondary | ICD-10-CM

## 2015-12-02 DIAGNOSIS — Z6841 Body Mass Index (BMI) 40.0 and over, adult: Secondary | ICD-10-CM | POA: Insufficient documentation

## 2015-12-02 DIAGNOSIS — IMO0001 Reserved for inherently not codable concepts without codable children: Secondary | ICD-10-CM

## 2015-12-02 MED ORDER — BUPROPION HCL ER (XL) 150 MG PO TB24: ORAL_TABLET | ORAL | 6 refills | Status: DC

## 2015-12-02 MED ORDER — LOSARTAN POTASSIUM-HCTZ 100-25 MG PO TABS: 1.0000 | ORAL_TABLET | Freq: Every day | ORAL | 12 refills | Status: DC

## 2015-12-02 MED ORDER — METOPROLOL TARTRATE 50 MG PO TABS: 50.0000 mg | ORAL_TABLET | Freq: Two times a day (BID) | ORAL | 6 refills | Status: DC

## 2015-12-02 MED ORDER — PAROXETINE HCL 20 MG PO TABS: 20.0000 mg | ORAL_TABLET | Freq: Every day | ORAL | 6 refills | Status: DC

## 2015-12-02 NOTE — Patient Instructions (Signed)
Melatonin 5-10mg  Hypertension Hypertension, commonly called high blood pressure, is when the force of blood pumping through your arteries is too strong. Your arteries are the blood vessels that carry blood from your heart throughout your body. A blood pressure reading consists of a higher number over a lower number, such as 110/72. The higher number (systolic) is the pressure inside your arteries when your heart pumps. The lower number (diastolic) is the pressure inside your arteries when your heart relaxes. Ideally you want your blood pressure below 120/80. Hypertension forces your heart to work harder to pump blood. Your arteries may become narrow or stiff. Having untreated or uncontrolled hypertension can cause heart attack, stroke, kidney disease, and other problems. RISK FACTORS Some risk factors for high blood pressure are controllable. Others are not.  Risk factors you cannot control include:   Race. You may be at higher risk if you are African American.  Age. Risk increases with age.  Gender. Men are at higher risk than women before age 49 years. After age 49, women are at higher risk than men. Risk factors you can control include:  Not getting enough exercise or physical activity.  Being overweight.  Getting too much fat, sugar, calories, or salt in your diet.  Drinking too much alcohol. SIGNS AND SYMPTOMS Hypertension does not usually cause signs or symptoms. Extremely high blood pressure (hypertensive crisis) may cause headache, anxiety, shortness of breath, and nosebleed. DIAGNOSIS To check if you have hypertension, your health care provider will measure your blood pressure while you are seated, with your arm held at the level of your heart. It should be measured at least twice using the same arm. Certain conditions can cause a difference in blood pressure between your right and left arms. A blood pressure reading that is higher than normal on one occasion does not mean that you  need treatment. If it is not clear whether you have high blood pressure, you may be asked to return on a different day to have your blood pressure checked again. Or, you may be asked to monitor your blood pressure at home for 1 or more weeks. TREATMENT Treating high blood pressure includes making lifestyle changes and possibly taking medicine. Living a healthy lifestyle can help lower high blood pressure. You may need to change some of your habits. Lifestyle changes may include:  Following the DASH diet. This diet is high in fruits, vegetables, and whole grains. It is low in salt, red meat, and added sugars.  Keep your sodium intake below 2,300 mg per day.  Getting at least 30-45 minutes of aerobic exercise at least 4 times per week.  Losing weight if necessary.  Not smoking.  Limiting alcoholic beverages.  Learning ways to reduce stress. Your health care provider may prescribe medicine if lifestyle changes are not enough to get your blood pressure under control, and if one of the following is true:  You are 3518-49 years of age and your systolic blood pressure is above 140.  You are 49 years of age or older, and your systolic blood pressure is above 150.  Your diastolic blood pressure is above 90.  You have diabetes, and your systolic blood pressure is over 140 or your diastolic blood pressure is over 90.  You have kidney disease and your blood pressure is above 140/90.  You have heart disease and your blood pressure is above 140/90. Your personal target blood pressure may vary depending on your medical conditions, your age, and other factors. HOME  CARE INSTRUCTIONS  Have your blood pressure rechecked as directed by your health care provider.   Take medicines only as directed by your health care provider. Follow the directions carefully. Blood pressure medicines must be taken as prescribed. The medicine does not work as well when you skip doses. Skipping doses also puts you at  risk for problems.  Do not smoke.   Monitor your blood pressure at home as directed by your health care provider. SEEK MEDICAL CARE IF:   You think you are having a reaction to medicines taken.  You have recurrent headaches or feel dizzy.  You have swelling in your ankles.  You have trouble with your vision. SEEK IMMEDIATE MEDICAL CARE IF:  You develop a severe headache or confusion.  You have unusual weakness, numbness, or feel faint.  You have severe chest or abdominal pain.  You vomit repeatedly.  You have trouble breathing. MAKE SURE YOU:   Understand these instructions.  Will watch your condition.  Will get help right away if you are not doing well or get worse.   This information is not intended to replace advice given to you by your health care provider. Make sure you discuss any questions you have with your health care provider.   Document Released: 04/02/2005 Document Revised: 08/17/2014 Document Reviewed: 01/23/2013 Elsevier Interactive Patient Education [employer]2016 Elsevier Inc.

## 2015-12-02 NOTE — Progress Notes (Signed)
BP (!) 190/120   Pulse 78   Temp 98.6 F (37 C) (Oral)   Ht 5' 3.75" (1.619 m)   Wt 270 lb 6.4 oz (122.7 kg)   BMI 46.78 kg/m    Subjective:    Patient ID: Connie Shepard, female    DOB: 11/26/1965, 50 y.o.   MRN: [id number]  HPI: Connie Shepard is a 50 y.o. female presenting on 12/02/2015 for Medication Refill; Fatigue (Wants to sleep all of the time ); Excessive Sweating (States she is hot all the time ); and No motivation   HPI   Relevant past medical, surgical, family and social history reviewed and updated as indicated. Interim medical history since our last visit reviewed. Allergies and medications reviewed and updated.  Review of Systems  HENT: Negative.   Respiratory: Negative.   Cardiovascular: Negative.   Psychiatric/Behavioral: Positive for decreased concentration, dysphoric mood and sleep disturbance. Negative for agitation, behavioral problems, confusion, hallucinations, self-injury and suicidal ideas. The patient is nervous/anxious. The patient is not hyperactive.   All other systems reviewed and are negative.   Per HPI unless specifically indicated above     Medication List       Accurate as of 12/02/15  5:25 PM. Always use your most recent med list.          amphetamine-dextroamphetamine 20 MG 24 hr capsule Commonly known as:  ADDERALL XR   buPROPion 150 MG 24 hr tablet Commonly known as:  WELLBUTRIN XL 1 tablet QAM, 7 days, then 2 tab QAM   losartan-hydrochlorothiazide 100-25 MG tablet Commonly known as:  HYZAAR Take 1 tablet by mouth daily.   metoprolol 50 MG tablet Commonly known as:  LOPRESSOR Take 1 tablet (50 mg total) by mouth 2 (two) times daily.   PARoxetine 20 MG tablet Commonly known as:  PAXIL Take 1 tablet (20 mg total) by mouth daily.          Objective:    BP (!) 190/120   Pulse 78   Temp 98.6 F (37 C) (Oral)   Ht 5' 3.75" (1.619 m)   Wt 270 lb 6.4 oz (122.7 kg)   BMI 46.78 kg/m   Wt Readings from Last 3  Encounters:  12/02/15 270 lb 6.4 oz (122.7 kg)    Physical Exam  Constitutional: She is oriented to person, place, and time. She appears well-developed. She appears distressed.  HENT:  Head: Normocephalic and atraumatic.  Eyes: Conjunctivae and EOM are normal. Pupils are equal, round, and reactive to light.  Neck: Normal range of motion. Neck supple.  Cardiovascular: Normal rate, regular rhythm, normal heart sounds and intact distal pulses.   Pulmonary/Chest: Effort normal and breath sounds normal.  Abdominal: Soft. Bowel sounds are normal.  Neurological: She is alert and oriented to person, place, and time. She has normal reflexes.  Skin: Skin is warm and dry. No rash noted.  Psychiatric: Her speech is normal and behavior is normal. Judgment and thought content normal. Her mood appears anxious. Cognition and memory are normal.  Tearful when talking about her job loss.  Was fired from job at [employer] of many years.  More of a personal matter and court upheld that the county system had to give her an equal job, as the dismissal was not appropriate.        Assessment & Plan:   1. Essential hypertension Low salt, decrease caffeine, medication change, hold adderall (has not had for 1 month) - metoprolol (LOPRESSOR) 50 MG tablet; Take  1 tablet (50 mg total) by mouth 2 (two) times daily.  Dispense: 60 tablet; Refill: 6 - losartan-hydrochlorothiazide (HYZAAR) 100-25 MG tablet; Take 1 tablet by mouth daily.  Dispense: 30 tablet; Refill: 12 - CMP14+EGFR; Standing - CBC with Differential; Standing - TSH; Standing  2. Depression Medication change and seek counseling at [place] - buPROPion (WELLBUTRIN XL) 150 MG 24 hr tablet; 1 tablet QAM, 7 days, then 2 tab QAM  Dispense: 60 tablet; Refill: 6 - PARoxetine (PAXIL) 20 MG tablet; Take 1 tablet (20 mg total) by mouth daily.  Dispense: 30 tablet; Refill: 6  3. Insomnia Melatonin 5-10mg at bed  4. Body mass index  45.0-49.9, adult (HCC) Diet for now - Lipid panel; Standing - TSH; Standing  5. Attention deficit hyperactivity disorder (ADHD), predominantly inattentive type Hold until BP is controlled - amphetamine-dextroamphetamine (ADDERALL XR) 20 MG 24 hr capsule;   6. Well adult In list for future lab of vitamin D at her lab visit next month. - VITAMIN D 25 Hydroxy (Vit-D Deficiency, Fractures); Standing    Follow up plan: Return in about 3 weeks (around 12/23/2015).  Counseling provided for all of the vaccine components Orders Placed This Encounter  Procedures  . CMP14+EGFR  . CBC with Differential  . Lipid panel  . TSH  . VITAMIN D 25 Hydroxy (Vit-D Deficiency, Fractures)    Terald Sleeper PA-C [facility] [address]  [place], [place] [address] [phone]   12/02/2015, 5:25 PM

## 2015-12-26 ENCOUNTER — Ambulatory Visit: Payer: BC Managed Care – PPO | Admitting: Physician Assistant

## 2015-12-27 ENCOUNTER — Encounter: Payer: Self-pay | Admitting: Physician Assistant

## 2016-02-11 ENCOUNTER — Telehealth: Payer: Self-pay | Admitting: Physician Assistant

## 2016-02-11 ENCOUNTER — Other Ambulatory Visit: Payer: Self-pay | Admitting: *Deleted

## 2016-02-11 MED ORDER — MEDROXYPROGESTERONE ACETATE 10 MG PO TABS: 10.0000 mg | ORAL_TABLET | Freq: Every day | ORAL | 0 refills | Status: DC

## 2016-02-11 NOTE — Telephone Encounter (Signed)
Received phone call from patient stating that she has not had a period in over 1 year.  Patient states that she started having heavy bleeding last night 02/10/2016 that is bleeding through super tampons every hour.  Per Prudy FeelerAngel Jones take 10mg  of provera if bleeding does not calm down she will need to go to the hospital.  Patient verbalized understanding.

## 2016-02-23 ENCOUNTER — Ambulatory Visit: Payer: BC Managed Care – PPO | Admitting: Women's Health

## 2016-03-16 ENCOUNTER — Ambulatory Visit: Payer: BC Managed Care – PPO | Admitting: Women's Health

## 2016-05-23 ENCOUNTER — Ambulatory Visit (INDEPENDENT_AMBULATORY_CARE_PROVIDER_SITE_OTHER): Payer: BC Managed Care – PPO | Admitting: Physician Assistant

## 2016-05-23 ENCOUNTER — Encounter: Payer: Self-pay | Admitting: Physician Assistant

## 2016-05-23 ENCOUNTER — Encounter (INDEPENDENT_AMBULATORY_CARE_PROVIDER_SITE_OTHER): Payer: Self-pay

## 2016-05-23 VITALS — BP 188/112 | HR 70 | Temp 97.6°F | Ht 63.75 in | Wt 272.4 lb

## 2016-05-23 DIAGNOSIS — I10 Essential (primary) hypertension: Secondary | ICD-10-CM | POA: Diagnosis not present

## 2016-05-23 DIAGNOSIS — L219 Seborrheic dermatitis, unspecified: Secondary | ICD-10-CM | POA: Insufficient documentation

## 2016-05-23 MED ORDER — CLOBETASOL PROPIONATE 0.05 % EX SOLN: 1.0000 "application " | Freq: Two times a day (BID) | CUTANEOUS | 6 refills | Status: DC

## 2016-05-23 MED ORDER — HYDRALAZINE HCL 25 MG PO TABS: 25.0000 mg | ORAL_TABLET | Freq: Three times a day (TID) | ORAL | 0 refills | Status: DC

## 2016-05-23 NOTE — Patient Instructions (Addendum)
In a few days you may receive a survey in the mail or online from [employer] regarding your visit with us today. Please take a moment to fill this out. Your feedback is very important to our whole office. It can help us better understand your needs as well as improve your experience and satisfaction. Thank you for taking your time to complete it. We care about you.  Prudy FeelerAngel Laquentin Loudermilk, PA-C   Seborrheic Dermatitis, Adult Seborrheic dermatitis is a skin disease that causes red, scaly patches. It usually occurs on the scalp, and it is often called dandruff. The patches may appear on other parts of the body. Skin patches tend to appear where there are many oil glands in the skin. Areas of the body that are commonly affected include:  Scalp.  Skin folds of the body.  Ears.  Eyebrows.  Neck.  Face.  Armpits.  The bearded area of men's faces. The condition may come and go for no known reason, and it is often long-lasting (chronic). What are the causes? The cause of this condition is not known. What increases the risk? This condition is more likely to develop in people who:  Have certain conditions, such as:  HIV (human immunodeficiency virus).  AIDS (acquired immunodeficiency syndrome).  Parkinson disease.  Mood disorders, such as depression.  Are 7040-50 years old. What are the signs or symptoms? Symptoms of this condition include:  Thick scales on the scalp.  Redness on the face or in the armpits.  Skin that is flaky. The flakes may be white or yellow.  Skin that seems oily or dry but is not helped with moisturizers.  Itching or burning in the affected areas. How is this diagnosed? This condition is diagnosed with a medical history and physical exam. A sample of your skin may be tested (skin biopsy). You may need to see a skin specialist (dermatologist). How is this treated? There is no cure for this condition, but treatment can help to manage the symptoms. You may get  treatment to remove scales, lower the risk of skin infection, and reduce swelling or itching. Treatment may include:  Creams that reduce swelling and irritation (steroids).  Creams that reduce skin yeast.  Medicated shampoo, soaps, moisturizing creams, or ointments.  Medicated moisturizing creams or ointments. Follow these instructions at home:  Apply over-the-counter and prescription medicines only as told by your health care provider.  Use any medicated shampoo, soaps, skin creams, or ointments only as told by your health care provider.  Keep all follow-up visits as told by your health care provider. This is important. Contact a health care provider if:  Your symptoms do not improve with treatment.  Your symptoms get worse.  You have new symptoms. This information is not intended to replace advice given to you by your health care provider. Make sure you discuss any questions you have with your health care provider. Document Released: 04/02/2005 Document Revised: 10/21/2015 Document Reviewed: 07/21/2015 Elsevier Interactive Patient Education  2017 [employer]

## 2016-05-25 NOTE — Progress Notes (Signed)
BP (!) 188/112   Pulse 70   Temp 97.6 F (36.4 C) (Oral)   Ht 5' 3.75" (1.619 m)   Wt 272 lb 6.4 oz (123.6 kg)   BMI 47.12 kg/m    Subjective:    Patient ID: Connie Shepard, female    DOB: 24-Apr-1965, 51 y.o.   MRN: [id number]  HPI: Connie Shepard is a 51 y.o. female presenting on 05/23/2016 for Rash (on head )  Rash on head for several weeks, has been tight and tender.  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   Some known seborrhea on face and nape of neck.  This patient comes in for periodic recheck on medications and conditions. All medications are reviewed today. There are no reports of any problems with the medications. All of the medical conditions are reviewed and updated.  Lab work is reviewed and will be ordered as medically necessary.   Relevant past medical, surgical, family and social history reviewed and updated as indicated. Allergies and medications reviewed and updated.  Past Medical History:  Diagnosis Date  . ADD (attention deficit disorder)   . Depression   . Hypertension     History reviewed. No pertinent surgical history.  Review of Systems  Constitutional: Negative.  Negative for activity change, fatigue and fever.  HENT: Negative.   Eyes: Negative.   Respiratory: Negative.  Negative for cough.   Cardiovascular: Negative.  Negative for chest pain.  Gastrointestinal: Negative.  Negative for abdominal pain.  Endocrine: Negative.   Genitourinary: Negative.  Negative for dysuria.  Musculoskeletal: Negative.   Skin: Negative.   Neurological: Negative.     Allergies as of 05/23/2016   No Known Allergies     Medication List       Accurate as of 05/23/16 11:59 PM. Always use your most recent med list.          amphetamine-dextroamphetamine 20 MG 24 hr capsule Commonly known as:  ADDERALL XR   buPROPion 150 MG 24 hr tablet Commonly known as:  WELLBUTRIN XL 1 tablet QAM, 7 days, then 2 tab QAM   clobetasol 0.05 %  external solution Commonly known as:  TEMOVATE Apply 1 application topically 2 (two) times daily.   hydrALAZINE 25 MG tablet Commonly known as:  APRESOLINE Take 1 tablet (25 mg total) by mouth 3 (three) times daily.   losartan-hydrochlorothiazide 100-25 MG tablet Commonly known as:  HYZAAR Take 1 tablet by mouth daily.   medroxyPROGESTERone 10 MG tablet Commonly known as:  PROVERA Take 1 tablet (10 mg total) by mouth daily.   metoprolol 50 MG tablet Commonly known as:  LOPRESSOR Take 1 tablet (50 mg total) by mouth 2 (two) times daily.   PARoxetine 20 MG tablet Commonly known as:  PAXIL Take 1 tablet (20 mg total) by mouth daily.          Objective:    BP (!) 188/112   Pulse 70   Temp 97.6 F (36.4 C) (Oral)   Ht 5' 3.75" (1.619 m)   Wt 272 lb 6.4 oz (123.6 kg)   BMI 47.12 kg/m   No Known Allergies  Physical Exam  Constitutional: She is oriented to person, place, and time. She appears well-developed and well-nourished.  HENT:  Head: Normocephalic and atraumatic.  Eyes: Conjunctivae and EOM are normal. Pupils are equal, round, and reactive to light.  Cardiovascular: Normal rate, regular rhythm, normal heart sounds and intact distal pulses.   Pulmonary/Chest: Effort normal  and breath sounds normal.  Abdominal: Soft. Bowel sounds are normal.  Neurological: She is alert and oriented to person, place, and time. She has normal reflexes.  Skin: Skin is warm and dry. Rash noted. Rash is macular. There is erythema.     Psychiatric: She has a normal mood and affect. Her behavior is normal. Judgment and thought content normal.  Nursing note and vitals reviewed.       Assessment & Plan:   1. Essential hypertension - hydrALAZINE (APRESOLINE) 25 MG tablet; Take 1 tablet (25 mg total) by mouth 3 (three) times daily.  Dispense: 90 tablet; Refill: 0  2. Seborrheic dermatitis of scalp - clobetasol (TEMOVATE) 0.05 % external solution; Apply 1 application topically 2 (two)  times daily.  Dispense: 100 mL; Refill: 6   Continue all other maintenance medications as listed above.  Follow up plan: Return in about 4 weeks (around 06/20/2016) for recheck BP.  Educational handout given for seborrheic dermatitis  Remus LofflerAngel S. Kaylub Detienne PA-C [facility] [address]  [place], [place] [id number] [phone]   05/25/2016, 5:00 PM

## 2016-07-31 ENCOUNTER — Ambulatory Visit: Payer: BC Managed Care – PPO | Admitting: Physician Assistant

## 2016-08-07 ENCOUNTER — Other Ambulatory Visit: Payer: Self-pay | Admitting: Physician Assistant

## 2016-08-07 DIAGNOSIS — F32A Depression, unspecified: Secondary | ICD-10-CM

## 2016-08-07 DIAGNOSIS — F329 Major depressive disorder, single episode, unspecified: Secondary | ICD-10-CM

## 2016-08-07 DIAGNOSIS — I10 Essential (primary) hypertension: Secondary | ICD-10-CM

## 2016-09-12 ENCOUNTER — Encounter: Payer: Self-pay | Admitting: Physician Assistant

## 2016-09-12 ENCOUNTER — Ambulatory Visit (INDEPENDENT_AMBULATORY_CARE_PROVIDER_SITE_OTHER): Payer: BC Managed Care – PPO | Admitting: Physician Assistant

## 2016-09-12 DIAGNOSIS — F332 Major depressive disorder, recurrent severe without psychotic features: Secondary | ICD-10-CM | POA: Diagnosis not present

## 2016-09-12 DIAGNOSIS — I10 Essential (primary) hypertension: Secondary | ICD-10-CM

## 2016-09-12 DIAGNOSIS — F900 Attention-deficit hyperactivity disorder, predominantly inattentive type: Secondary | ICD-10-CM | POA: Diagnosis not present

## 2016-09-12 MED ORDER — AMPHETAMINE-DEXTROAMPHET ER 20 MG PO CP24: 20.0000 mg | ORAL_CAPSULE | Freq: Every day | ORAL | 0 refills | Status: DC

## 2016-09-12 MED ORDER — BUPROPION HCL ER (XL) 150 MG PO TB24: 300.0000 mg | ORAL_TABLET | Freq: Every day | ORAL | 11 refills | Status: DC

## 2016-09-12 MED ORDER — PAROXETINE HCL 40 MG PO TABS: 20.0000 mg | ORAL_TABLET | Freq: Every day | ORAL | 2 refills | Status: DC

## 2016-09-12 MED ORDER — LOSARTAN POTASSIUM-HCTZ 100-25 MG PO TABS: 1.0000 | ORAL_TABLET | Freq: Every day | ORAL | 12 refills | Status: DC

## 2016-09-12 MED ORDER — PAROXETINE HCL 40 MG PO TABS: 40.0000 mg | ORAL_TABLET | Freq: Every day | ORAL | 5 refills | Status: DC

## 2016-09-12 MED ORDER — METOPROLOL TARTRATE 50 MG PO TABS: 50.0000 mg | ORAL_TABLET | Freq: Two times a day (BID) | ORAL | 11 refills | Status: DC

## 2016-09-12 MED ORDER — HYDRALAZINE HCL 50 MG PO TABS: 50.0000 mg | ORAL_TABLET | Freq: Three times a day (TID) | ORAL | 5 refills | Status: DC

## 2016-09-12 MED ORDER — HYDRALAZINE HCL 25 MG PO TABS: 25.0000 mg | ORAL_TABLET | Freq: Three times a day (TID) | ORAL | 5 refills | Status: DC

## 2016-09-12 NOTE — Patient Instructions (Signed)
DASH Eating Plan DASH stands for "Dietary Approaches to Stop Hypertension." The DASH eating plan is a healthy eating plan that has been shown to reduce high blood pressure (hypertension). It may also reduce your risk for type 2 diabetes, heart disease, and stroke. The DASH eating plan may also help with weight loss. What are tips for following this plan? General guidelines  Avoid eating more than 2,300 mg (milligrams) of salt (sodium) a day. If you have hypertension, you may need to reduce your sodium intake to 1,500 mg a day.  Limit alcohol intake to no more than 1 drink a day for nonpregnant women and 2 drinks a day for men. One drink equals 12 oz of beer, 5 oz of wine, or 1 oz of hard liquor.  Work with your health care provider to maintain a healthy body weight or to lose weight. Ask what an ideal weight is for you.  Get at least 30 minutes of exercise that causes your heart to beat faster (aerobic exercise) most days of the week. Activities may include walking, swimming, or biking.  Work with your health care provider or diet and nutrition specialist (dietitian) to adjust your eating plan to your individual calorie needs. Reading food labels  Check food labels for the amount of sodium per serving. Choose foods with less than 5 percent of the Daily Value of sodium. Generally, foods with less than 300 mg of sodium per serving fit into this eating plan.  To find whole grains, look for the word "whole" as the first word in the ingredient list. Shopping  Buy products labeled as "low-sodium" or "no salt added."  Buy fresh foods. Avoid canned foods and premade or frozen meals. Cooking  Avoid adding salt when cooking. Use salt-free seasonings or herbs instead of table salt or sea salt. Check with your health care provider or pharmacist before using salt substitutes.  Do not fry foods. Cook foods using healthy methods such as baking, boiling, grilling, and broiling instead.  Cook with  heart-healthy oils, such as olive, canola, soybean, or sunflower oil. Meal planning   Eat a balanced diet that includes: ? 5 or more servings of fruits and vegetables each day. At each meal, try to fill half of your plate with fruits and vegetables. ? Up to 6-8 servings of whole grains each day. ? Less than 6 oz of lean meat, poultry, or fish each day. A 3-oz serving of meat is about the same size as a deck of cards. One egg equals 1 oz. ? 2 servings of low-fat dairy each day. ? A serving of nuts, seeds, or beans 5 times each week. ? Heart-healthy fats. Healthy fats called Omega-3 fatty acids are found in foods such as flaxseeds and coldwater fish, like sardines, salmon, and mackerel.  Limit how much you eat of the following: ? Canned or prepackaged foods. ? Food that is high in trans fat, such as fried foods. ? Food that is high in saturated fat, such as fatty meat. ? Sweets, desserts, sugary drinks, and other foods with added sugar. ? Full-fat dairy products.  Do not salt foods before eating.  Try to eat at least 2 vegetarian meals each week.  Eat more home-cooked food and less restaurant, buffet, and fast food.  When eating at a restaurant, ask that your food be prepared with less salt or no salt, if possible. What foods are recommended? The items listed may not be a complete list. Talk with your dietitian about what   dietary choices are best for you. Grains Whole-grain or whole-wheat bread. Whole-grain or whole-wheat pasta. Brown rice. Oatmeal. Quinoa. Bulgur. Whole-grain and low-sodium cereals. Pita bread. Low-fat, low-sodium crackers. Whole-wheat flour tortillas. Vegetables Fresh or frozen vegetables (raw, steamed, roasted, or grilled). Low-sodium or reduced-sodium tomato and vegetable juice. Low-sodium or reduced-sodium tomato sauce and tomato paste. Low-sodium or reduced-sodium canned vegetables. Fruits All fresh, dried, or frozen fruit. Canned fruit in natural juice (without  added sugar). Meat and other protein foods Skinless chicken or turkey. Ground chicken or turkey. Pork with fat trimmed off. Fish and seafood. Egg whites. Dried beans, peas, or lentils. Unsalted nuts, nut butters, and seeds. Unsalted canned beans. Lean cuts of beef with fat trimmed off. Low-sodium, lean deli meat. Dairy Low-fat (1%) or fat-free (skim) milk. Fat-free, low-fat, or reduced-fat cheeses. Nonfat, low-sodium ricotta or cottage cheese. Low-fat or nonfat yogurt. Low-fat, low-sodium cheese. Fats and oils Soft margarine without trans fats. Vegetable oil. Low-fat, reduced-fat, or light mayonnaise and salad dressings (reduced-sodium). Canola, safflower, olive, soybean, and sunflower oils. Avocado. Seasoning and other foods Herbs. Spices. Seasoning mixes without salt. Unsalted popcorn and pretzels. Fat-free sweets. What foods are not recommended? The items listed may not be a complete list. Talk with your dietitian about what dietary choices are best for you. Grains Baked goods made with fat, such as croissants, muffins, or some breads. Dry pasta or rice meal packs. Vegetables Creamed or fried vegetables. Vegetables in a cheese sauce. Regular canned vegetables (not low-sodium or reduced-sodium). Regular canned tomato sauce and paste (not low-sodium or reduced-sodium). Regular tomato and vegetable juice (not low-sodium or reduced-sodium). Pickles. Olives. Fruits Canned fruit in a light or heavy syrup. Fried fruit. Fruit in cream or butter sauce. Meat and other protein foods Fatty cuts of meat. Ribs. Fried meat. Bacon. Sausage. Bologna and other processed lunch meats. Salami. Fatback. Hotdogs. Bratwurst. Salted nuts and seeds. Canned beans with added salt. Canned or smoked fish. Whole eggs or egg yolks. Chicken or turkey with skin. Dairy Whole or 2% milk, cream, and half-and-half. Whole or full-fat cream cheese. Whole-fat or sweetened yogurt. Full-fat cheese. Nondairy creamers. Whipped toppings.  Processed cheese and cheese spreads. Fats and oils Butter. Stick margarine. Lard. Shortening. Ghee. Bacon fat. Tropical oils, such as coconut, palm kernel, or palm oil. Seasoning and other foods Salted popcorn and pretzels. Onion salt, garlic salt, seasoned salt, table salt, and sea salt. Worcestershire sauce. Tartar sauce. Barbecue sauce. Teriyaki sauce. Soy sauce, including reduced-sodium. Steak sauce. Canned and packaged gravies. Fish sauce. Oyster sauce. Cocktail sauce. Horseradish that you find on the shelf. Ketchup. Mustard. Meat flavorings and tenderizers. Bouillon cubes. Hot sauce and Tabasco sauce. Premade or packaged marinades. Premade or packaged taco seasonings. Relishes. Regular salad dressings. Where to find more information:  National Heart, Lung, and Blood Institute: www.nhlbi.nih.gov  American Heart Association: www.heart.org Summary  The DASH eating plan is a healthy eating plan that has been shown to reduce high blood pressure (hypertension). It may also reduce your risk for type 2 diabetes, heart disease, and stroke.  With the DASH eating plan, you should limit salt (sodium) intake to 2,300 mg a day. If you have hypertension, you may need to reduce your sodium intake to 1,500 mg a day.  When on the DASH eating plan, aim to eat more fresh fruits and vegetables, whole grains, lean proteins, low-fat dairy, and heart-healthy fats.  Work with your health care provider or diet and nutrition specialist (dietitian) to adjust your eating plan to your individual   calorie needs. This information is not intended to replace advice given to you by your health care provider. Make sure you discuss any questions you have with your health care provider. Document Released: 03/22/2011 Document Revised: 03/26/2016 Document Reviewed: 03/26/2016 Elsevier Interactive Patient Education  2017 Elsevier Inc.  

## 2016-09-13 NOTE — Progress Notes (Signed)
BP (!) 172/101   Pulse 66   Temp 97.4 F (36.3 C) (Oral)   Ht 5' 3.75" (1.619 m)   Wt 276 lb 6.4 oz (125.4 kg)   BMI 47.82 kg/m    Subjective:    Patient ID: Connie Shepard, female    DOB: [birth year], 51 y.o.   MRN: [id number]  HPI: Connie CapronDenise Meloni is a 51 y.o. female presenting on 09/12/2016 for No chief complaint on file.  This patient comes in for periodic recheck on medications and conditions including hypertension, severe depression episode, ADHD. She has not been taking her vyvanse but would like to get back on it.  She had seen better BP readings and is up for increasing the hydralazine.   All medications are reviewed today. There are no reports of any problems with the medications. All of the medical conditions are reviewed and updated.  Lab work is reviewed and will be ordered as medically necessary. There are no new problems reported with today's visit.   Relevant past medical, surgical, family and social history reviewed and updated as indicated. Allergies and medications reviewed and updated.  Past Medical History:  Diagnosis Date  . ADD (attention deficit disorder)   . Depression   . Hypertension     History reviewed. No pertinent surgical history.  Review of Systems  Constitutional: Negative.  Negative for activity change, fatigue and fever.  HENT: Negative.   Eyes: Negative.   Respiratory: Negative.  Negative for cough.   Cardiovascular: Negative.  Negative for chest pain.  Gastrointestinal: Negative.  Negative for abdominal pain.  Endocrine: Negative.   Genitourinary: Negative.  Negative for dysuria.  Musculoskeletal: Negative.   Skin: Negative.   Neurological: Negative.     Allergies as of 09/12/2016   No Known Allergies     Medication List       Accurate as of 09/12/16 11:59 PM. Always use your most recent med list.          amphetamine-dextroamphetamine 20 MG 24 hr capsule Commonly known as:  ADDERALL XR Take 1 capsule (20 mg total) by mouth  daily.   amphetamine-dextroamphetamine 20 MG 24 hr capsule Commonly known as:  ADDERALL XR Take 1 capsule (20 mg total) by mouth daily.   amphetamine-dextroamphetamine 20 MG 24 hr capsule Commonly known as:  ADDERALL XR Take 1 capsule (20 mg total) by mouth daily.   buPROPion 150 MG 24 hr tablet Commonly known as:  WELLBUTRIN XL Take 2 tablets (300 mg total) by mouth daily.   hydrALAZINE 50 MG tablet Commonly known as:  APRESOLINE Take 1 tablet (50 mg total) by mouth 3 (three) times daily.   losartan-hydrochlorothiazide 100-25 MG tablet Commonly known as:  HYZAAR Take 1 tablet by mouth daily.   metoprolol tartrate 50 MG tablet Commonly known as:  LOPRESSOR Take 1 tablet (50 mg total) by mouth 2 (two) times daily.   PARoxetine 40 MG tablet Commonly known as:  PAXIL Take 1 tablet (40 mg total) by mouth daily.          Objective:    BP (!) 172/101   Pulse 66   Temp 97.4 F (36.3 C) (Oral)   Ht 5' 3.75" (1.619 m)   Wt 276 lb 6.4 oz (125.4 kg)   BMI 47.82 kg/m   No Known Allergies  Physical Exam  Constitutional: She is oriented to person, place, and time. She appears well-developed and well-nourished.  HENT:  Head: Normocephalic and atraumatic.  Right Ear: Tympanic membrane, external  ear and ear canal normal.  Left Ear: Tympanic membrane, external ear and ear canal normal.  Nose: Nose normal. No rhinorrhea.  Mouth/Throat: Oropharynx is clear and moist and mucous membranes are normal. No oropharyngeal exudate or posterior oropharyngeal erythema.  Eyes: Conjunctivae and EOM are normal. Pupils are equal, round, and reactive to light.  Neck: Normal range of motion. Neck supple.  Cardiovascular: Normal rate, regular rhythm, normal heart sounds and intact distal pulses.   Pulmonary/Chest: Effort normal and breath sounds normal.  Abdominal: Soft. Bowel sounds are normal.  Neurological: She is alert and oriented to person, place, and time. She has normal reflexes.    Skin: Skin is warm and dry. No rash noted.  Psychiatric: She has a normal mood and affect. Her behavior is normal. Judgment and thought content normal.  Nursing note and vitals reviewed.       Assessment & Plan:   1. Severe episode of recurrent major depressive disorder, without psychotic features (HCC) - buPROPion (WELLBUTRIN XL) 150 MG 24 hr tablet; Take 2 tablets (300 mg total) by mouth daily.  Dispense: 60 tablet; Refill: 11 - PARoxetine (PAXIL) 40 MG tablet; Take 1 tablet (40 mg total) by mouth daily.  Dispense: 30 tablet; Refill: 5  2. Essential hypertension - metoprolol tartrate (LOPRESSOR) 50 MG tablet; Take 1 tablet (50 mg total) by mouth 2 (two) times daily.  Dispense: 60 tablet; Refill: 11 - losartan-hydrochlorothiazide (HYZAAR) 100-25 MG tablet; Take 1 tablet by mouth daily.  Dispense: 30 tablet; Refill: 12 - hydrALAZINE (APRESOLINE) 50 MG tablet; Take 1 tablet (50 mg total) by mouth 3 (three) times daily.  Dispense: 90 tablet; Refill: 5  3. Attention deficit hyperactivity disorder (ADHD), predominantly inattentive type - amphetamine-dextroamphetamine (ADDERALL XR) 20 MG 24 hr capsule; Take 1 capsule (20 mg total) by mouth daily.  Dispense: 30 capsule; Refill: 0 - amphetamine-dextroamphetamine (ADDERALL XR) 20 MG 24 hr capsule; Take 1 capsule (20 mg total) by mouth daily.  Dispense: 30 capsule; Refill: 0 - amphetamine-dextroamphetamine (ADDERALL XR) 20 MG 24 hr capsule; Take 1 capsule (20 mg total) by mouth daily.  Dispense: 30 capsule; Refill: 0   Current Outpatient Prescriptions:  .  buPROPion (WELLBUTRIN XL) 150 MG 24 hr tablet, Take 2 tablets (300 mg total) by mouth daily., Disp: 60 tablet, Rfl: 11 .  hydrALAZINE (APRESOLINE) 50 MG tablet, Take 1 tablet (50 mg total) by mouth 3 (three) times daily., Disp: 90 tablet, Rfl: 5 .  losartan-hydrochlorothiazide (HYZAAR) 100-25 MG tablet, Take 1 tablet by mouth daily., Disp: 30 tablet, Rfl: 12 .  metoprolol tartrate (LOPRESSOR)  50 MG tablet, Take 1 tablet (50 mg total) by mouth 2 (two) times daily., Disp: 60 tablet, Rfl: 11 .  PARoxetine (PAXIL) 40 MG tablet, Take 1 tablet (40 mg total) by mouth daily., Disp: 30 tablet, Rfl: 5 .  amphetamine-dextroamphetamine (ADDERALL XR) 20 MG 24 hr capsule, Take 1 capsule (20 mg total) by mouth daily., Disp: 30 capsule, Rfl: 0 .  amphetamine-dextroamphetamine (ADDERALL XR) 20 MG 24 hr capsule, Take 1 capsule (20 mg total) by mouth daily., Disp: 30 capsule, Rfl: 0 .  amphetamine-dextroamphetamine (ADDERALL XR) 20 MG 24 hr capsule, Take 1 capsule (20 mg total) by mouth daily., Disp: 30 capsule, Rfl: 0  Continue all other maintenance medications as listed above.  Follow up plan: Return in about 3 months (around 12/13/2016) for recheck.  Educational handout given for DASH diet  Remus Loffler PA-C [facility] [address]  [place], [place] [address] [phone]   09/13/2016, 11:22 PM

## 2018-04-04 ENCOUNTER — Observation Stay (HOSPITAL_COMMUNITY)
Admission: EM | Admit: 2018-04-04 | Discharge: 2018-04-05 | Disposition: A | Discharge status: Home / Self Care | Payer: Medicaid Other | Attending: Internal Medicine | Admitting: Internal Medicine

## 2018-04-04 ENCOUNTER — Encounter (HOSPITAL_COMMUNITY): Payer: Self-pay | Admitting: Emergency Medicine

## 2018-04-04 ENCOUNTER — Emergency Department (HOSPITAL_COMMUNITY): Payer: Medicaid Other

## 2018-04-04 ENCOUNTER — Observation Stay (HOSPITAL_BASED_OUTPATIENT_CLINIC_OR_DEPARTMENT_OTHER): Payer: Medicaid Other

## 2018-04-04 DIAGNOSIS — R7989 Other specified abnormal findings of blood chemistry: Secondary | ICD-10-CM | POA: Diagnosis present

## 2018-04-04 DIAGNOSIS — E669 Obesity, unspecified: Secondary | ICD-10-CM | POA: Diagnosis present

## 2018-04-04 DIAGNOSIS — I248 Other forms of acute ischemic heart disease: Secondary | ICD-10-CM

## 2018-04-04 DIAGNOSIS — R778 Other specified abnormalities of plasma proteins: Secondary | ICD-10-CM | POA: Diagnosis present

## 2018-04-04 DIAGNOSIS — Z79899 Other long term (current) drug therapy: Secondary | ICD-10-CM | POA: Insufficient documentation

## 2018-04-04 DIAGNOSIS — R531 Weakness: Secondary | ICD-10-CM | POA: Diagnosis present

## 2018-04-04 DIAGNOSIS — I160 Hypertensive urgency: Secondary | ICD-10-CM

## 2018-04-04 DIAGNOSIS — I130 Hypertensive heart and chronic kidney disease with heart failure and stage 1 through stage 4 chronic kidney disease, or unspecified chronic kidney disease: Secondary | ICD-10-CM | POA: Diagnosis not present

## 2018-04-04 DIAGNOSIS — I10 Essential (primary) hypertension: Secondary | ICD-10-CM | POA: Diagnosis present

## 2018-04-04 DIAGNOSIS — N289 Disorder of kidney and ureter, unspecified: Secondary | ICD-10-CM

## 2018-04-04 DIAGNOSIS — I509 Heart failure, unspecified: Secondary | ICD-10-CM | POA: Diagnosis not present

## 2018-04-04 DIAGNOSIS — N189 Chronic kidney disease, unspecified: Secondary | ICD-10-CM | POA: Insufficient documentation

## 2018-04-04 DIAGNOSIS — J40 Bronchitis, not specified as acute or chronic: Secondary | ICD-10-CM | POA: Diagnosis present

## 2018-04-04 DIAGNOSIS — I169 Hypertensive crisis, unspecified: Secondary | ICD-10-CM | POA: Diagnosis not present

## 2018-04-04 HISTORY — DX: Other specified abnormalities of plasma proteins: R77.8

## 2018-04-04 HISTORY — DX: Obesity, unspecified: E66.9

## 2018-04-04 HISTORY — DX: Other specified abnormal findings of blood chemistry: R79.89

## 2018-04-04 HISTORY — DX: Bronchitis, not specified as acute or chronic: J40

## 2018-04-04 HISTORY — DX: Heart failure, unspecified: I50.9

## 2018-04-04 LAB — RAPID URINE DRUG SCREEN, HOSP PERFORMED
Amphetamines: NOT DETECTED
Barbiturates: NOT DETECTED
Benzodiazepines: NOT DETECTED
Cocaine: NOT DETECTED
OPIATES: NOT DETECTED
Tetrahydrocannabinol: NOT DETECTED

## 2018-04-04 LAB — CBC WITH DIFFERENTIAL/PLATELET
Abs Immature Granulocytes: 0.03 10*3/uL (ref 0.00–0.07)
BASOS ABS: 0.1 10*3/uL (ref 0.0–0.1)
Basophils Relative: 1 %
EOS PCT: 1 %
Eosinophils Absolute: 0.1 10*3/uL (ref 0.0–0.5)
HCT: 49.2 % — ABNORMAL HIGH (ref 36.0–46.0)
Hemoglobin: 15.8 g/dL — ABNORMAL HIGH (ref 12.0–15.0)
Immature Granulocytes: 0 %
Lymphocytes Relative: 17 %
Lymphs Abs: 1.5 10*3/uL (ref 0.7–4.0)
MCH: 32.1 pg (ref 26.0–34.0)
MCHC: 32.1 g/dL (ref 30.0–36.0)
MCV: 100 fL (ref 80.0–100.0)
Monocytes Absolute: 0.6 10*3/uL (ref 0.1–1.0)
Monocytes Relative: 6 %
NRBC: 0 % (ref 0.0–0.2)
Neutro Abs: 6.8 10*3/uL (ref 1.7–7.7)
Neutrophils Relative %: 75 %
Platelets: 229 10*3/uL (ref 150–400)
RBC: 4.92 MIL/uL (ref 3.87–5.11)
RDW: 15.8 % — ABNORMAL HIGH (ref 11.5–15.5)
WBC: 9.2 10*3/uL (ref 4.0–10.5)

## 2018-04-04 LAB — BASIC METABOLIC PANEL
Anion gap: 12 (ref 5–15)
BUN: 17 mg/dL (ref 6–20)
CO2: 27 mmol/L (ref 22–32)
CREATININE: 1.3 mg/dL — AB (ref 0.44–1.00)
Calcium: 9.3 mg/dL (ref 8.9–10.3)
Chloride: 101 mmol/L (ref 98–111)
GFR calc Af Amer: 55 mL/min — ABNORMAL LOW (ref >60)
GFR calc non Af Amer: 48 mL/min — ABNORMAL LOW (ref >60)
Glucose, Bld: 166 mg/dL — ABNORMAL HIGH (ref 70–99)
Potassium: 4.1 mmol/L (ref 3.5–5.1)
SODIUM: 140 mmol/L (ref 135–145)

## 2018-04-04 LAB — URINALYSIS, ROUTINE W REFLEX MICROSCOPIC
Bilirubin Urine: NEGATIVE
GLUCOSE, UA: NEGATIVE mg/dL
HGB URINE DIPSTICK: NEGATIVE
Ketones, ur: NEGATIVE mg/dL
Leukocytes, UA: NEGATIVE
Nitrite: NEGATIVE
Protein, ur: NEGATIVE mg/dL
Specific Gravity, Urine: 1.004 — ABNORMAL LOW (ref 1.005–1.030)
pH: 7 (ref 5.0–8.0)

## 2018-04-04 LAB — I-STAT TROPONIN, ED: Troponin i, poc: 0.11 ng/mL (ref 0.00–0.08)

## 2018-04-04 LAB — ECHOCARDIOGRAM COMPLETE
Height: 63.5 in
Weight: 4667.2 oz

## 2018-04-04 LAB — BRAIN NATRIURETIC PEPTIDE: B Natriuretic Peptide: 979.4 pg/mL — ABNORMAL HIGH (ref 0.0–100.0)

## 2018-04-04 LAB — TROPONIN I
Troponin I: 0.23 ng/mL (ref <0.03)
Troponin I: 0.24 ng/mL (ref <0.03)
Troponin I: 0.22 ng/mL (ref <0.03)
Troponin I: 0.21 ng/mL (ref <0.03)

## 2018-04-04 LAB — HIV ANTIBODY (ROUTINE TESTING W REFLEX): HIV SCREEN 4TH GENERATION: NONREACTIVE

## 2018-04-04 IMAGING — CR DG CHEST 2V
2 series · 2 of 2 positions shown · non-contrast
Comparison: None.

CLINICAL DATA: Shortness of breath and weakness

EXAM:
CHEST - 2 VIEW

[chest pa]
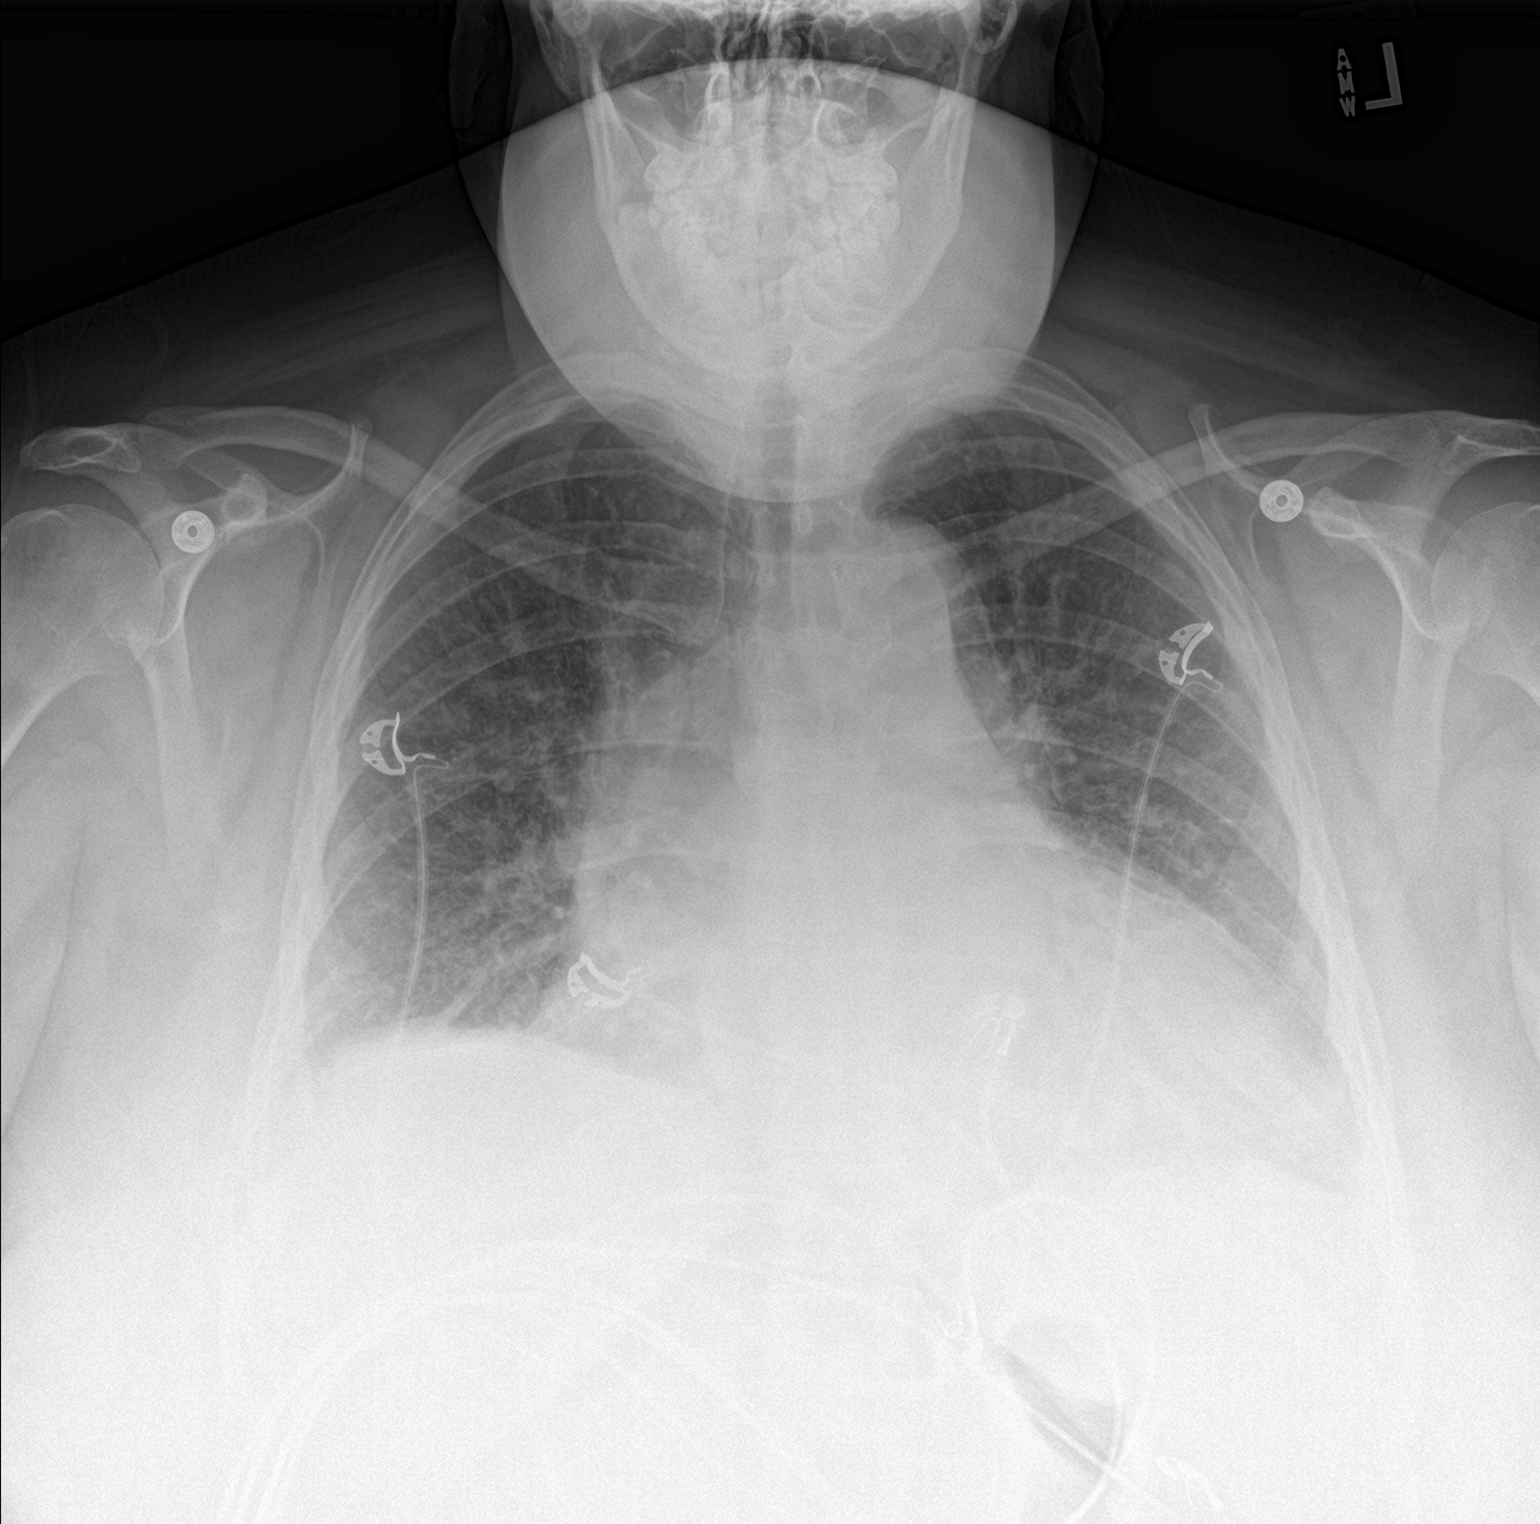

[chest lat]
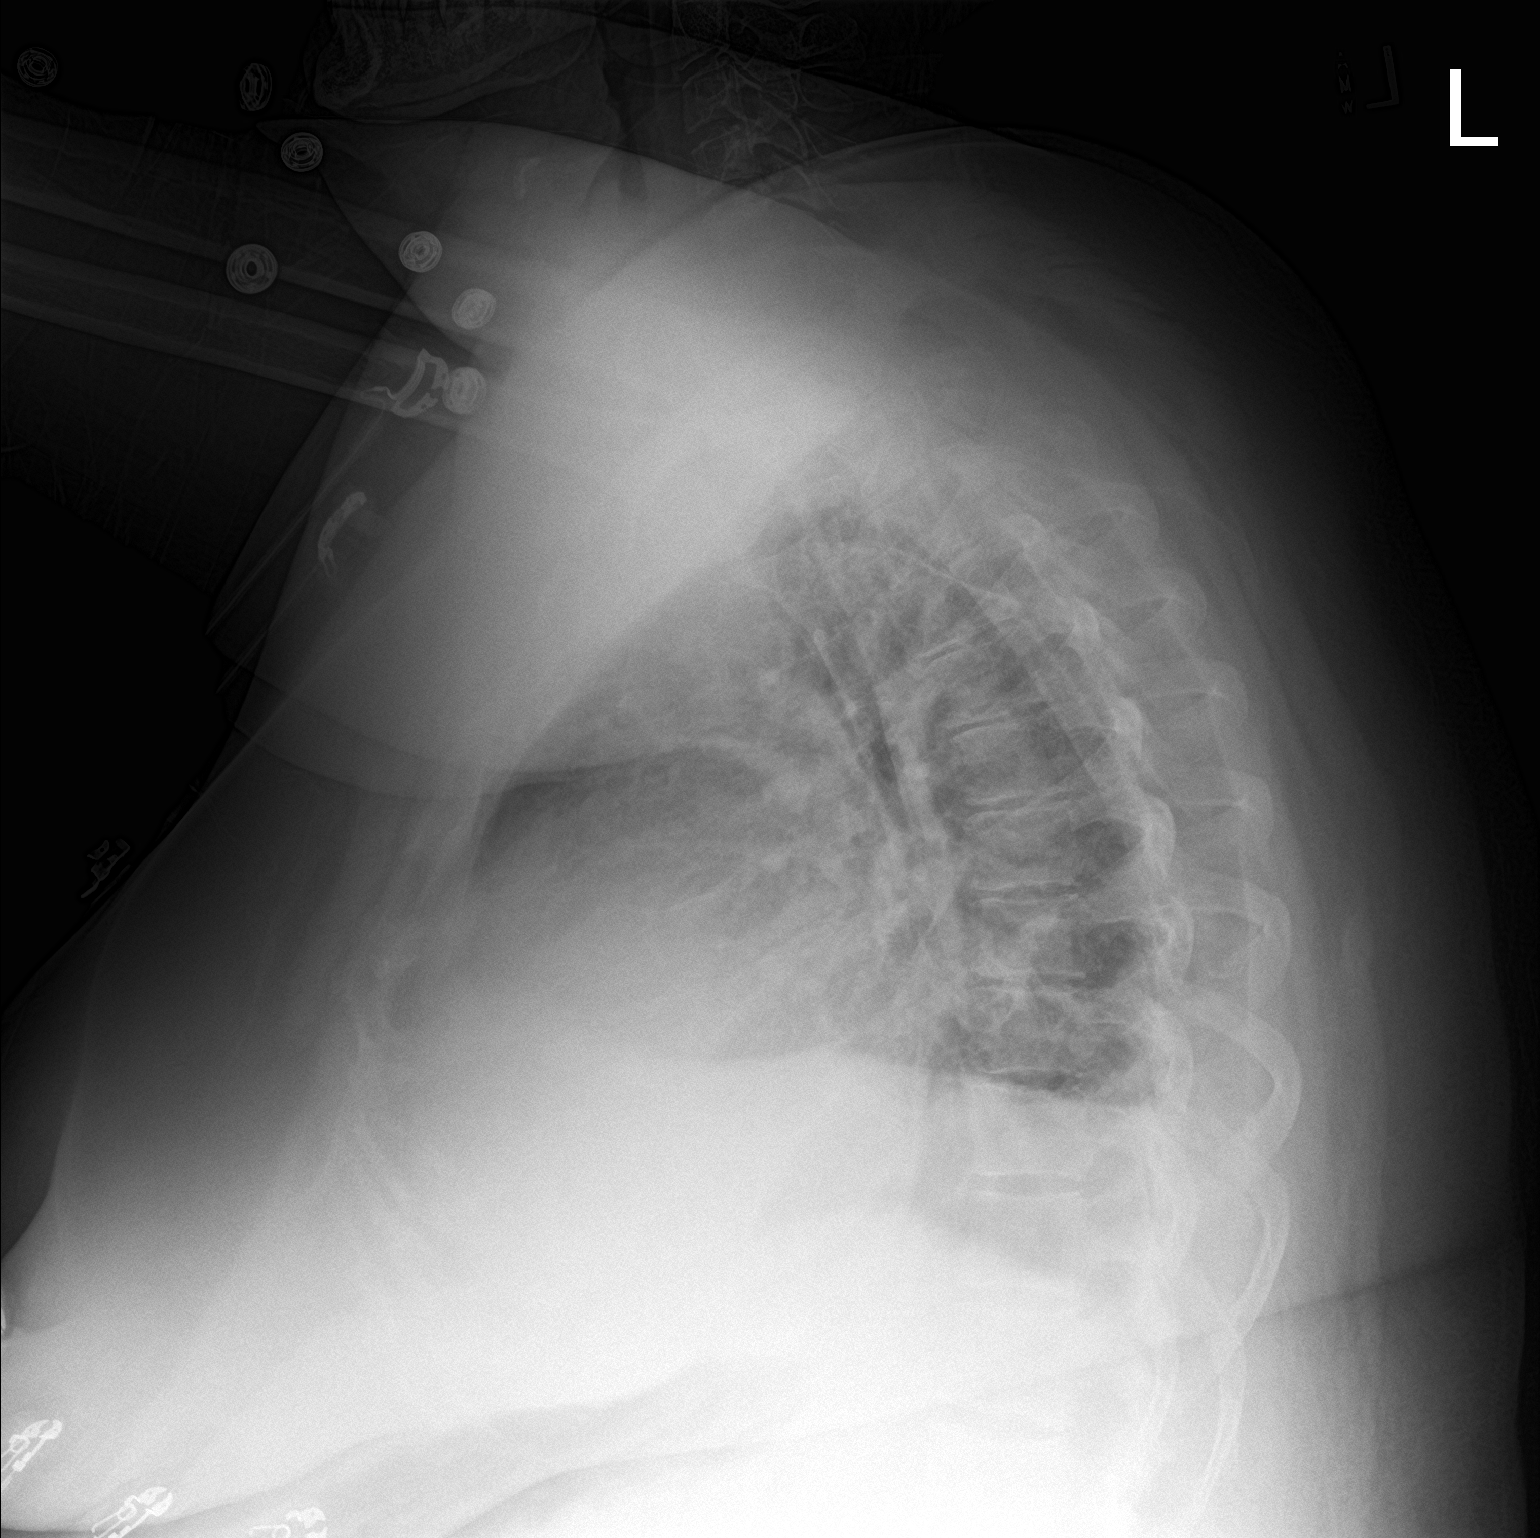

[2 of 2 positions shown; findings below may reference images not displayed]

FINDINGS: The lungs are well inflated. There is moderate cardiomegaly.

There is no focal airspace consolidation or overt pulmonary edema.
There is no pleural effusion or pneumothorax.
IMPRESSION: Moderate cardiomegaly without overt pulmonary edema.

## 2018-04-04 MED ORDER — ALBUTEROL SULFATE (2.5 MG/3ML) 0.083% IN NEBU
2.5000 mg | INHALATION_SOLUTION | RESPIRATORY_TRACT | Status: DC
  Administered 2018-04-04: 2.5 mg via RESPIRATORY_TRACT
  Filled 2018-04-04: qty 3

## 2018-04-04 MED ORDER — FUROSEMIDE 10 MG/ML IJ SOLN
40.0000 mg | Freq: Two times a day (BID) | INTRAMUSCULAR | Status: DC
  Administered 2018-04-04 – 2018-04-05 (×2): 40 mg via INTRAVENOUS
  Filled 2018-04-04 (×2): qty 4

## 2018-04-04 MED ORDER — METOPROLOL TARTRATE 50 MG PO TABS
50.0000 mg | ORAL_TABLET | Freq: Two times a day (BID) | ORAL | Status: DC
  Administered 2018-04-04: 50 mg via ORAL
  Filled 2018-04-04: qty 1

## 2018-04-04 MED ORDER — HYDRALAZINE HCL 50 MG PO TABS
50.0000 mg | ORAL_TABLET | Freq: Three times a day (TID) | ORAL | Status: DC
  Administered 2018-04-04 – 2018-04-05 (×4): 50 mg via ORAL
  Filled 2018-04-04 (×4): qty 1

## 2018-04-04 MED ORDER — SODIUM CHLORIDE 0.9% FLUSH: 3.0000 mL | INTRAVENOUS | Status: DC | PRN

## 2018-04-04 MED ORDER — SPIRONOLACTONE 12.5 MG HALF TABLET
12.5000 mg | ORAL_TABLET | Freq: Every day | ORAL | Status: DC
  Administered 2018-04-04 – 2018-04-05 (×2): 12.5 mg via ORAL
  Filled 2018-04-04 (×2): qty 1

## 2018-04-04 MED ORDER — BENZONATATE 100 MG PO CAPS: 100.0000 mg | ORAL_CAPSULE | Freq: Two times a day (BID) | ORAL | Status: DC | PRN

## 2018-04-04 MED ORDER — ASPIRIN 81 MG PO CHEW
324.0000 mg | CHEWABLE_TABLET | Freq: Once | ORAL | Status: AC
  Administered 2018-04-04: 324 mg via ORAL
  Filled 2018-04-04: qty 4

## 2018-04-04 MED ORDER — METHYLPREDNISOLONE SODIUM SUCC 125 MG IJ SOLR
60.0000 mg | Freq: Two times a day (BID) | INTRAMUSCULAR | Status: AC
  Administered 2018-04-04 – 2018-04-05 (×3): 60 mg via INTRAVENOUS
  Filled 2018-04-04 (×3): qty 2

## 2018-04-04 MED ORDER — FUROSEMIDE 10 MG/ML IJ SOLN
40.0000 mg | Freq: Once | INTRAMUSCULAR | Status: AC
  Administered 2018-04-04: 40 mg via INTRAVENOUS
  Filled 2018-04-04: qty 4

## 2018-04-04 MED ORDER — LABETALOL HCL 5 MG/ML IV SOLN
10.0000 mg | Freq: Once | INTRAVENOUS | Status: AC
  Administered 2018-04-04: 10 mg via INTRAVENOUS
  Filled 2018-04-04: qty 4

## 2018-04-04 MED ORDER — LOSARTAN POTASSIUM 50 MG PO TABS
100.0000 mg | ORAL_TABLET | Freq: Every day | ORAL | Status: DC
  Administered 2018-04-04 – 2018-04-05 (×2): 100 mg via ORAL
  Filled 2018-04-04 (×2): qty 2

## 2018-04-04 MED ORDER — ACETAMINOPHEN 325 MG PO TABS: 650.0000 mg | ORAL_TABLET | ORAL | Status: DC | PRN

## 2018-04-04 MED ORDER — ONDANSETRON HCL 4 MG/2ML IJ SOLN: 4.0000 mg | Freq: Four times a day (QID) | INTRAMUSCULAR | Status: DC | PRN

## 2018-04-04 MED ORDER — CARVEDILOL 6.25 MG PO TABS
6.2500 mg | ORAL_TABLET | Freq: Two times a day (BID) | ORAL | Status: DC
  Administered 2018-04-04 – 2018-04-05 (×2): 6.25 mg via ORAL
  Filled 2018-04-04 (×2): qty 1

## 2018-04-04 MED ORDER — HYDRALAZINE HCL 20 MG/ML IJ SOLN
10.0000 mg | INTRAMUSCULAR | Status: DC | PRN
  Administered 2018-04-04: 10 mg via INTRAVENOUS
  Filled 2018-04-04: qty 1

## 2018-04-04 MED ORDER — ENOXAPARIN SODIUM 40 MG/0.4ML ~~LOC~~ SOLN
40.0000 mg | SUBCUTANEOUS | Status: DC
  Filled 2018-04-04 (×3): qty 0.4

## 2018-04-04 MED ORDER — SODIUM CHLORIDE 0.9% FLUSH
3.0000 mL | Freq: Two times a day (BID) | INTRAVENOUS | Status: DC
  Administered 2018-04-04 – 2018-04-05 (×2): 3 mL via INTRAVENOUS

## 2018-04-04 MED ORDER — NITROGLYCERIN 0.4 MG SL SUBL: 0.4000 mg | SUBLINGUAL_TABLET | SUBLINGUAL | Status: DC | PRN

## 2018-04-04 MED ORDER — SODIUM CHLORIDE 0.9 % IV SOLN: 250.0000 mL | INTRAVENOUS | Status: DC | PRN

## 2018-04-04 NOTE — Care Management Note (Addendum)
Case Management Note  Patient Details  Name: Connie Shepard MRN: [id number] Date of Birth: [birth year]  Subjective/Objective:                    Action/Plan:  Independent patient from home. States that her insurance will be in effect Jan 1. [facility]. [facility] prior to 5. If this is not completed, patient will need a MATCH letter for DC.  Patient verified she has PCP.  Expected Discharge Date:                  Expected Discharge Plan:  Home/Self Care  In-House Referral:     Discharge planning Services  CM Consult, MATCH Program  Post Acute Care Choice:    Choice offered to:     DME Arranged:    DME Agency:     HH Arranged:    HH Agency:     Status of Service:  Completed, signed off  If discussed at [employer] Length of Stay Meetings, dates discussed:    Additional Comments:  Lawerance SabalDebbie Nahmir Zeidman, RN 04/04/2018, 10:57 AM

## 2018-04-04 NOTE — Progress Notes (Signed)
  Echocardiogram 2D Echocardiogram has been performed.  Delcie RochENNINGTON, Martina Brodbeck 04/04/2018, 9:22 AM

## 2018-04-04 NOTE — ED Notes (Signed)
Patient requesting to have her pants cut off stating they are "cutting into me, I cant breathe." this RN offered to help patient take her pants off but patient insisted they be cut.

## 2018-04-04 NOTE — ED Notes (Signed)
Dr. opyd at bedside.  

## 2018-04-04 NOTE — ED Triage Notes (Addendum)
Pt arrives via [facility] for c/o sob and htn since the Monday before thanksgiving but symptoms worsened this week. Pt has been out of her bp meds and lasix x1.5 years due to insurance issues. resp labored at rest. bp 186/160 with ems

## 2018-04-04 NOTE — ED Provider Notes (Signed)
TIME SEEN: 4:18 AM  CHIEF COMPLAINT: Shortness of breath  HPI: Patient is a 52 year old female with history of hypertension, obesity who presents to the emergency department with feeling very weak like she has no energy, feeling short of breath with nonproductive cough for the past week and a half.  No chest pain or chest discomfort.  Reports she has been out of her blood pressure medication and Lasix for the past year and a half due to insurance issues.  She is not sure what her blood pressure or heart rate normally runs.  States she thinks she has bronchitis.  Has had some lower extremity swelling.  Denies history of CHF.  States shortness of breath is worse with exertion and lying flat.  No known history of PE or DVT.  Patient is a very poor historian.  ROS: See HPI Constitutional: no fever  Eyes: no drainage  ENT: no runny nose   Cardiovascular:  no chest pain  Resp: SOB  GI: no vomiting GU: no dysuria Integumentary: no rash  Allergy: no hives  Musculoskeletal: no leg swelling  Neurological: no slurred speech ROS otherwise negative  PAST MEDICAL HISTORY/PAST SURGICAL HISTORY:  Past Medical History:  Diagnosis Date  . ADD (attention deficit disorder)   . Depression   . Hypertension     MEDICATIONS:  Prior to Admission medications   Medication Sig Start Date End Date Taking? Authorizing Provider  amphetamine-dextroamphetamine (ADDERALL XR) 20 MG 24 hr capsule Take 1 capsule (20 mg total) by mouth daily. 09/12/16   Remus LofflerJones, Angel S, PA-C  amphetamine-dextroamphetamine (ADDERALL XR) 20 MG 24 hr capsule Take 1 capsule (20 mg total) by mouth daily. 09/12/16   Remus LofflerJones, Angel S, PA-C  amphetamine-dextroamphetamine (ADDERALL XR) 20 MG 24 hr capsule Take 1 capsule (20 mg total) by mouth daily. 09/12/16   Remus LofflerJones, Angel S, PA-C  buPROPion (WELLBUTRIN XL) 150 MG 24 hr tablet Take 2 tablets (300 mg total) by mouth daily. 09/12/16   Remus LofflerJones, Angel S, PA-C  hydrALAZINE (APRESOLINE) 50 MG tablet Take 1  tablet (50 mg total) by mouth 3 (three) times daily. 09/12/16   Remus LofflerJones, Angel S, PA-C  losartan-hydrochlorothiazide (HYZAAR) 100-25 MG tablet Take 1 tablet by mouth daily. 09/12/16   Remus LofflerJones, Angel S, PA-C  metoprolol tartrate (LOPRESSOR) 50 MG tablet Take 1 tablet (50 mg total) by mouth 2 (two) times daily. 09/12/16   Remus LofflerJones, Angel S, PA-C  PARoxetine (PAXIL) 40 MG tablet Take 1 tablet (40 mg total) by mouth daily. 09/12/16   Remus LofflerJones, Angel S, PA-C    ALLERGIES:  No Known Allergies  SOCIAL HISTORY:  Social History   Tobacco Use  . Smoking status: Never Smoker  . Smokeless tobacco: Never Used  Substance Use Topics  . Alcohol use: No    FAMILY HISTORY: Family History  Problem Relation Age of Onset  . Kidney Stones Mother   . Hypertension Mother   . Heart disease Father     EXAM: BP (!) 181/156   Pulse (!) 118   Temp 97.6 F (36.4 C) (Oral)   Resp (!) 22   SpO2 97%  CONSTITUTIONAL: Alert and oriented and responds appropriately to questions.  Obese HEAD: Normocephalic EYES: Conjunctivae clear, pupils appear equal, EOMI ENT: normal nose; moist mucous membranes NECK: Supple, no meningismus, no nuchal rigidity, no LAD  CARD: Regular and tachycardic; S1 and S2 appreciated; no murmurs, no clicks, no rubs, no gallops RESP: Appears short of breath and is speaking short sentences.  Mildly tachypneic.  Sitting upright in the bed.  Sats in the low 90s on room air.  Diminished aeration at bases bilaterally with bibasilar crackles.  No rhonchi or wheezing. ABD/GI: Normal bowel sounds; non-distended; soft, non-tender, no rebound, no guarding, no peritoneal signs, no hepatosplenomegaly BACK:  The back appears normal and is non-tender to palpation, there is no CVA tenderness EXT: Normal ROM in all joints; non-tender to palpation; nonpitting edema in bilateral lower extremities in her feet and ankles; normal capillary refill; no cyanosis, no calf tenderness or swelling    SKIN: Normal color for age  and race; warm; no rash NEURO: Moves all extremities equally PSYCH: The patient's mood and manner are appropriate. Grooming and personal hygiene are appropriate.  MEDICAL DECISION MAKING: Patient here with likely hypertensive urgency/emergency.  She appears slightly volume overloaded on exam.  Will give aspirin, IV labetalol.  Will obtain cardiac labs, chest x-ray.  She feels like she has bronchitis but has no fever or productive cough.  I feel she will likely need admission to the hospital.  EKG shows no ischemic abnormality.  ED PROGRESS: Patient's labs show mild elevation of her creatinine and troponin which is again likely from her blood pressure being out of control.  Her chest x-ray shows cardiomegaly without overt edema but clinically she appears volume overloaded.  Blood pressure improving with IV labetalol.  Will give second dose as well as aspirin and IV Lasix.  Will admit to medicine.  5:42 AM Discussed patient's case with hospitalist, Dr. Antionette Charpyd.  I have recommended admission and patient (and family if present) agree with this plan. Admitting physician will place admission orders.   I reviewed all nursing notes, vitals, pertinent previous records, EKGs, lab and urine results, imaging (as available).     EKG Interpretation  Date/Time:  Friday April 04 2018 04:12:09 EST Ventricular Rate:  115 PR Interval:    QRS Duration: 84 QT Interval:  334 QTC Calculation: 462 R Axis:   26 Text Interpretation:  Sinus tachycardia Low voltage, precordial leads Borderline T wave abnormalities No old tracing to compare Confirmed by Ward, Baxter HireKristen [phone]([id number]) on 04/04/2018 4:43:50 AM         CRITICAL CARE Performed by: Baxter HireKristen Ward   Total critical care time: 65 minutes  Critical care time was exclusive of separately billable procedures and treating other patients.  Critical care was necessary to treat or prevent imminent or life-threatening deterioration.  Critical care was time spent  personally by me on the following activities: development of treatment plan with patient and/or surrogate as well as nursing, discussions with consultants, evaluation of patient's response to treatment, examination of patient, obtaining history from patient or surrogate, ordering and performing treatments and interventions, ordering and review of laboratory studies, ordering and review of radiographic studies, pulse oximetry and re-evaluation of patient's condition.    Ward, Layla MawKristen N, DO 04/04/18 [phone]

## 2018-04-04 NOTE — Consult Note (Signed)
Cardiology Consultation:   Patient ID: Connie Shepard MRN: [id number]; DOB: Sep 23, 1965  Admit date: 04/04/2018 Date of Consult: 04/04/2018  Primary Care Provider: Remus LofflerJones, Angel S, PA-C Primary Cardiologist: No primary care provider on file.  Primary Electrophysiologist:  None    Patient Profile:   Connie Shepard is a 52 y.o. female with a hx of uncontrolled hypertension who is being seen today for the evaluation of congestive heart failure at the request of Dr. Benjamine MolaVann.  History of Present Illness:   Connie Shepard is a 52 year old woman with no past cardiac history.  She does have a longstanding history of hypertension and has been off of her medicine for quite some time.  She has had limited resources and lost her insurance.  She now presents with about 2 weeks of worsening fatigue, weakness, and shortness of breath.  Over 3 to 4 days she admits to orthopnea and PND as well as lower extremity edema.  She has had no chest pain or pressure.  She received a dose of IV Lasix after arrival here and reports good urine output.  She also reports dramatic improvement in her symptoms over the course of the day.  Currently she denies dyspnea, chest pain, heart palpitations, lightheadedness, or any recent episodes of presyncope.  An echocardiogram performed earlier today shows severe LV dysfunction with an LVEF of 30%.  Past Medical History:  Diagnosis Date  . Acute heart failure (HCC)   . ADD (attention deficit disorder)   . Bronchitis   . Depression   . Elevated troponin   . Hypertension   . Obesity     History reviewed. No pertinent surgical history.   Home Medications:  Prior to Admission medications   Medication Sig Start Date End Date Taking? Authorizing Provider  amphetamine-dextroamphetamine (ADDERALL XR) 20 MG 24 hr capsule Take 1 capsule (20 mg total) by mouth daily. Patient not taking: Reported on 04/04/2018 09/12/16   Remus LofflerJones, Angel S, PA-C  amphetamine-dextroamphetamine (ADDERALL XR) 20  MG 24 hr capsule Take 1 capsule (20 mg total) by mouth daily. Patient not taking: Reported on 04/04/2018 09/12/16   Remus LofflerJones, Angel S, PA-C  amphetamine-dextroamphetamine (ADDERALL XR) 20 MG 24 hr capsule Take 1 capsule (20 mg total) by mouth daily. Patient not taking: Reported on 04/04/2018 09/12/16   Remus LofflerJones, Angel S, PA-C  buPROPion (WELLBUTRIN XL) 150 MG 24 hr tablet Take 2 tablets (300 mg total) by mouth daily. Patient not taking: Reported on 04/04/2018 09/12/16   Remus LofflerJones, Angel S, PA-C  hydrALAZINE (APRESOLINE) 50 MG tablet Take 1 tablet (50 mg total) by mouth 3 (three) times daily. Patient not taking: Reported on 04/04/2018 09/12/16   Remus LofflerJones, Angel S, PA-C  losartan-hydrochlorothiazide (HYZAAR) 100-25 MG tablet Take 1 tablet by mouth daily. Patient not taking: Reported on 04/04/2018 09/12/16   Remus LofflerJones, Angel S, PA-C    Inpatient Medications: Scheduled Meds: . albuterol  2.5 mg Nebulization Q4H  . enoxaparin (LOVENOX) injection  40 mg Subcutaneous Q24H  . furosemide  40 mg Intravenous BID  . hydrALAZINE  50 mg Oral TID  . losartan  100 mg Oral Daily  . methylPREDNISolone (SOLU-MEDROL) injection  60 mg Intravenous Q12H  . metoprolol tartrate  50 mg Oral BID  . sodium chloride flush  3 mL Intravenous Q12H   Continuous Infusions: . sodium chloride     PRN Meds: sodium chloride, acetaminophen, benzonatate, hydrALAZINE, nitroGLYCERIN, ondansetron (ZOFRAN) IV, sodium chloride flush  Allergies:   No Known Allergies  Social History:   Social History  Socioeconomic History  . Marital status: Married    Spouse name: Not on file  . Number of children: Not on file  . Years of education: Not on file  . Highest education level: Not on file  Occupational History  . Not on file  Social Needs  . Financial resource strain: Not on file  . Food insecurity:    Worry: Not on file    Inability: Not on file  . Transportation needs:    Medical: Not on file    Non-medical: Not on file  Tobacco Use    . Smoking status: Never Smoker  . Smokeless tobacco: Never Used  Substance and Sexual Activity  . Alcohol use: No  . Drug use: No  . Sexual activity: Not on file  Lifestyle  . Physical activity:    Days per week: Not on file    Minutes per session: Not on file  . Stress: Not on file  Relationships  . Social connections:    Talks on phone: Not on file    Gets together: Not on file    Attends religious service: Not on file    Active member of club or organization: Not on file    Attends meetings of clubs or organizations: Not on file    Relationship status: Not on file  . Intimate partner violence:    Fear of current or ex partner: Not on file    Emotionally abused: Not on file    Physically abused: Not on file    Forced sexual activity: Not on file  Other Topics Concern  . Not on file  Social History Narrative  . Not on file    Family History:    Family History  Problem Relation Age of Onset  . Kidney Stones Mother   . Hypertension Mother   . Heart disease Father      ROS:  Please see the history of present illness.  All other ROS reviewed and negative.     Physical Exam/Data:   Vitals:   04/04/18 0939 04/04/18 1214 04/04/18 1429 04/04/18 1550  BP: (!) 160/104 (!) 182/113  (!) 171/115  Pulse:  90  94  Resp:  18  18  Temp:  97.6 F (36.4 C)  97.9 F (36.6 C)  TempSrc:  Oral  Oral  SpO2:  98% 98% 95%  Weight:      Height:       No intake or output data in the 24 hours ending 04/04/18 1853 Filed Weights   04/04/18 0707  Weight: 132.3 kg   Body mass index is 50.86 kg/m.  General:  Well nourished, well developed, pleasant obese woman in no acute distress HEENT: normal Lymph: no adenopathy Neck: no JVD Endocrine:  No thryomegaly Vascular: No carotid bruits; FA pulses 2+ bilaterally  Cardiac:  normal S1, S2; RRR; no murmur  Lungs:  clear to auscultation bilaterally, no wheezing, rhonchi or rales  Abd: soft, nontender, no hepatomegaly  Ext: 1+  bilateral pretibial edema Musculoskeletal:  No deformities, BUE and BLE strength normal and equal Skin: warm and dry  Neuro:  CNs 2-12 intact, no focal abnormalities noted Psych:  Normal affect   EKG:  The EKG was personally reviewed and demonstrates: Sinus tachycardia, low voltage, cannot rule out age-indeterminate anteroseptal infarct.  Telemetry:  Telemetry was personally reviewed and demonstrates: Normal sinus rhythm without significant arrhythmia  Relevant CV Studies: 2D echocardiogram: Study Conclusions  - Left ventricle: The cavity size was normal. Wall thickness  was   increased in a pattern of mild LVH. Systolic function was   moderately to severely reduced. The estimated ejection fraction   was in the range of 30% to 35%. Diffuse hypokinesis. Features are   consistent with a pseudonormal left ventricular filling pattern,   with concomitant abnormal relaxation and increased filling   pressure (grade 2 diastolic dysfunction). Doppler parameters are   consistent with high ventricular filling pressure. - Mitral valve: Calcified annulus. - Left atrium: The atrium was mildly dilated. - Pericardium, extracardiac: A small pericardial effusion was   identified.  Impressions:  - Moderate to severe global reduction in LV systolic functon; mild   LVH; moderate diastolic dysfunction; mild LAE; small pericardial   effusion.  Laboratory Data:  Chemistry Recent Labs  Lab 04/04/18 0423  NA 140  K 4.1  CL 101  CO2 27  GLUCOSE 166*  BUN 17  CREATININE 1.30*  CALCIUM 9.3  GFRNONAA 48*  GFRAA 55*  ANIONGAP 12    No results for input(s): PROT, ALBUMIN, AST, ALT, ALKPHOS, BILITOT in the last 168 hours. Hematology Recent Labs  Lab 04/04/18 0423  WBC 9.2  RBC 4.92  HGB 15.8*  HCT 49.2*  MCV 100.0  MCH 32.1  MCHC 32.1  RDW 15.8*  PLT 229   Cardiac Enzymes Recent Labs  Lab 04/04/18 0431 04/04/18 0712 04/04/18 1248  TROPONINI 0.23* 0.24* 0.22*    Recent Labs    Lab 04/04/18 0425  TROPIPOC 0.11*    BNP Recent Labs  Lab 04/04/18 0423  BNP 979.4*    DDimer No results for input(s): DDIMER in the last 168 hours.  Radiology/Studies:  Dg Chest 2 View  Result Date: 04/04/2018 CLINICAL DATA:  Shortness of breath and weakness EXAM: CHEST - 2 VIEW COMPARISON:  None. FINDINGS: The lungs are well inflated. There is moderate cardiomegaly. There is no focal airspace consolidation or overt pulmonary edema. There is no pleural effusion or pneumothorax. IMPRESSION: Moderate cardiomegaly without overt pulmonary edema. Electronically Signed   By: Deatra RobinsonKevin  Herman M.D.   On: 04/04/2018 05:22    Assessment and Plan:   52 year old woman with longstanding uncontrolled hypertension presenting with acute systolic heart failure, newly diagnosed cardiomyopathy with severe LV dysfunction LVEF 30 to 35%.  Lengthy discussion about the potential etiologies of systolic heart failure.  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  She understands the need to control her blood pressure and be compliant with her medications.  She is had good initial improvement with IV furosemide.  She is noted to have a low level flat troponin trend likely consistent with demand ischemia from congestive heart failure.  I talked to her about proceeding with a right and left heart catheterization on Monday to better evaluate the etiology of her cardiomyopathy and perform hemodynamic assessment.  I reviewed the risks, indications, and alternatives with the patient.  She understands that if significant obstructive disease is found she may be a candidate for PCI.  She is not sure she wants to proceed with this as an inpatient.  She is going to think it over tonight and let our team know in the morning.  If she does not, she could come back electively as an outpatient as long as she continues  to improve with medical therapy.  She is currently treated with losartan, hydralazine, metoprolol tartrate.  I am going to change her beta-blocker to  carvedilol.  Also will add Spironolactone.  Agree with continuation of IV furosemide.  We will follow with you.   For questions or updates, please contact [facility] Please consult [email or web address] for contact info under     Signed, Tonny Bollman, MD  04/04/2018 6:53 PM

## 2018-04-04 NOTE — H&P (Signed)
History and Physical    Connie Shepard VWU:[id number] DOB: September 22, 1965 DOA: 04/04/2018  PCP: Connie LofflerJones, Connie S, PA-C   Patient coming from: Home   Chief Complaint: SOB, high BP   HPI: Connie Shepard is a 52 y.o. female with medical history significant for hypertension and depression, now presenting to the emergency department for evaluation of shortness of breath, leg swelling, and fatigue.  Patient reports that she is going without her medications for more than a year now due to financial constraints, began to develop some shortness of breath and fatigue approximately 1 month ago, and then worsened significantly over the past 2 days.  She denies any chest pain, fevers, chills, or significant cough.  She reports that her blood pressure has been very high.  She denies headache, change in vision or hearing, or focal numbness or weakness.  ED Course: Upon arrival to the ED, patient is found to be afebrile, saturating adequately on room air, tachypneic with a rate of 30, tachycardic in the 110s, and hypertensive with blood pressure as high as 200/140.  EKG features a sinus tachycardia with rate 115 and chest x-ray is notable for moderate cardiomegaly with no definite pulmonary edema noted.  Chemistry panel features a creatinine of 1.30 with no prior labs in the last several years.  CBC features a mild polycythemia.  Troponin is slightly elevated at 0.11 and BNP is elevated to 979.  Patient was given 324 mg of aspirin, 10 mg IV labetalol, and 40 mg IV Lasix in the ED.  Blood pressure improved with labetalol and patient will be observed on the telemetry unit for ongoing evaluation and management of severe hypertension with acute CHF.  Review of Systems:  All other systems reviewed and apart from HPI, are negative.  Past Medical History:  Diagnosis Date  . ADD (attention deficit disorder)   . Depression   . Hypertension     History reviewed. No pertinent surgical history.   reports that she has never  smoked. She has never used smokeless tobacco. She reports that she does not drink alcohol or use drugs.  No Known Allergies  Family History  Problem Relation Age of Onset  . Kidney Stones Mother   . Hypertension Mother   . Heart disease Father      Prior to Admission medications   Medication Sig Start Date End Date Taking? Authorizing Provider  amphetamine-dextroamphetamine (ADDERALL XR) 20 MG 24 hr capsule Take 1 capsule (20 mg total) by mouth daily. Patient not taking: Reported on 04/04/2018 09/12/16   Connie LofflerJones, Connie S, PA-C  amphetamine-dextroamphetamine (ADDERALL XR) 20 MG 24 hr capsule Take 1 capsule (20 mg total) by mouth daily. Patient not taking: Reported on 04/04/2018 09/12/16   Connie LofflerJones, Connie S, PA-C  amphetamine-dextroamphetamine (ADDERALL XR) 20 MG 24 hr capsule Take 1 capsule (20 mg total) by mouth daily. Patient not taking: Reported on 04/04/2018 09/12/16   Connie LofflerJones, Connie S, PA-C  buPROPion (WELLBUTRIN XL) 150 MG 24 hr tablet Take 2 tablets (300 mg total) by mouth daily. Patient not taking: Reported on 04/04/2018 09/12/16   Connie LofflerJones, Connie S, PA-C  hydrALAZINE (APRESOLINE) 50 MG tablet Take 1 tablet (50 mg total) by mouth 3 (three) times daily. Patient not taking: Reported on 04/04/2018 09/12/16   Connie LofflerJones, Connie S, PA-C  losartan-hydrochlorothiazide (HYZAAR) 100-25 MG tablet Take 1 tablet by mouth daily. Patient not taking: Reported on 04/04/2018 09/12/16   Connie LofflerJones, Connie S, PA-C    Physical Exam: Vitals:   04/04/18 0430 04/04/18 0500  04/04/18 0515 04/04/18 0530  BP: (!) 200/139 (!) 175/135 (!) 163/132 (!) 168/135  Pulse: (!) 118 94 91   Resp: (!) 21 (!) 25 (!) 29   Temp:      TempSrc:      SpO2: 93% 96% 96%     Constitutional: Tachypneic, speaking short sentences, no pallor, no diaphoresis  Eyes: PERTLA, lids and conjunctivae normal ENMT: Mucous membranes are moist. Posterior pharynx clear of any exudate or lesions.   Neck: normal, supple, no masses, no thyromegaly Respiratory:  Mild tachypnea and dyspnea with speech. Rales bilaterally. No accessory muscle use.  Cardiovascular: S1 & S2 heard, regular rate and rhythm. Pretibial pitting edema bilaterally. Abdomen: No distension, no tenderness, soft. Bowel sounds active.  Musculoskeletal: no clubbing / cyanosis. No joint deformity upper and lower extremities.    Skin: no significant rashes, lesions, ulcers. Warm, dry, well-perfused. Neurologic: No facial asymmetry. Sensation intact. Moving all extremities.  Psychiatric: Alert and oriented x 3. Labile emotions.    Labs on Admission: I have personally reviewed following labs and imaging studies  CBC: Recent Labs  Lab 04/04/18 0423  WBC 9.2  NEUTROABS 6.8  HGB 15.8*  HCT 49.2*  MCV 100.0  PLT 229   Basic Metabolic Panel: Recent Labs  Lab 04/04/18 0423  NA 140  K 4.1  CL 101  CO2 27  GLUCOSE 166*  BUN 17  CREATININE 1.30*  CALCIUM 9.3   GFR: CrCl cannot be calculated (Unknown ideal weight.). Liver Function Tests: No results for input(Shepard): AST, ALT, ALKPHOS, BILITOT, PROT, ALBUMIN in the last 168 hours. No results for input(Shepard): LIPASE, AMYLASE in the last 168 hours. No results for input(Shepard): AMMONIA in the last 168 hours. Coagulation Profile: No results for input(Shepard): INR, PROTIME in the last 168 hours. Cardiac Enzymes: Recent Labs  Lab 04/04/18 0431  TROPONINI 0.23*   BNP (last 3 results) No results for input(Shepard): PROBNP in the last 8760 hours. HbA1C: No results for input(Shepard): HGBA1C in the last 72 hours. CBG: No results for input(Shepard): GLUCAP in the last 168 hours. Lipid Profile: No results for input(Shepard): CHOL, HDL, LDLCALC, TRIG, CHOLHDL, LDLDIRECT in the last 72 hours. Thyroid Function Tests: No results for input(Shepard): TSH, T4TOTAL, FREET4, T3FREE, THYROIDAB in the last 72 hours. Anemia Panel: No results for input(Shepard): VITAMINB12, FOLATE, FERRITIN, TIBC, IRON, RETICCTPCT in the last 72 hours. Urine analysis:    Component Value Date/Time    COLORURINE YELLOW 03/19/2010 1239   APPEARANCEUR TURBID (A) 03/19/2010 1239   LABSPEC 1.018 03/19/2010 1239   PHURINE 8.5 (H) 03/19/2010 1239   GLUCOSEU NEGATIVE 03/19/2010 1239   HGBUR LARGE (A) 03/19/2010 1239   BILIRUBINUR NEGATIVE 03/19/2010 1239   KETONESUR NEGATIVE 03/19/2010 1239   PROTEINUR NEGATIVE 03/19/2010 1239   UROBILINOGEN 0.2 03/19/2010 1239   NITRITE NEGATIVE 03/19/2010 1239   LEUKOCYTESUR NEGATIVE 03/19/2010 1239   Sepsis Labs: @LABRCNTIP (procalcitonin:4,lacticidven:4) )No results found for this or any previous visit (from the past 240 hour(Shepard)).   Radiological Exams on Admission: Dg Chest 2 View  Result Date: 04/04/2018 CLINICAL DATA:  Shortness of breath and weakness EXAM: CHEST - 2 VIEW COMPARISON:  None. FINDINGS: The lungs are well inflated. There is moderate cardiomegaly. There is no focal airspace consolidation or overt pulmonary edema. There is no pleural effusion or pneumothorax. IMPRESSION: Moderate cardiomegaly without overt pulmonary edema. Electronically Signed   By: Deatra RobinsonKevin  Herman M.D.   On: 04/04/2018 05:22    EKG: Independently reviewed. Sinus tachycardia (rate 115).  Assessment/Plan  1. Acute CHF  - Presents with SOB, bilateral leg swelling, and fatigue  - Found to have peripheral edema, BP 979, CXR with CM, rales on exam  - Possibly secondary to severe HTN  - She is being treated with Lasix 40 mg IV in ED  - Continue diuresis with Lasix 40 mg IV q12h, follow daily wt and I/O'Shepard, check echocardiogram    2. Hypertensive urgency  - BP as high as 200/140 in ED, improved with IV labetalol  - Anticipate further improvement with diuresis  - No headache or neuro deficits, no chest pain, mild renal insufficiency noted will check UA for protein - Resume her hydralazine, continue diuresis, use hydralazine IVP'Shepard or NTG as-needed, check UDS   3. Mild renal insufficiency  - SCr is 1.30 on admission; was normal on most recent prior labs from 2011  -  Possibly acute and secondary to severe HTN or hypervolemia  - Treat BP and hypervolemia as above, follow daily chem panel during diuresis    4. Elevated troponin  - POC troponin is 0.11 in ED without chest pain or acute ischemic features on EKG  - Likely reflects demand ischemia in setting of severe HTN and respiratory distress  - She was given ASA 324 mg in ED  - Continue cardiac monitoring, trend troponin, control BP, check echocardiogram    DVT prophylaxis: Lovenox  Code Status: Full  Family Communication: Discussed with patient  Consults called: None Admission status: Observation    Briscoe Deutscher, MD [facility] Pager [phone]  If 7PM-7AM, please contact night-coverage [email or web address] Password [facility]  04/04/2018, 5:48 AM

## 2018-04-04 NOTE — ED Notes (Signed)
ED Provider at bedside. 

## 2018-04-04 NOTE — Progress Notes (Addendum)
Patient admitted after midnight with respiratory distress related to acute heart failure in setting of hypertensive crisis and possible acute on chronic bronchitis. Patient has not had meds for over a year.  Provided with lasix and labatelol x2 and hydralazine TID. BP remains difficult to control. Chart review indicates past meds include losartan, HCTZ, metoprolol and hydralazine. Will add BB and losartan for now being mindful of renal function.    Have added scheduled nebs and solumedrol.  Not hypoxic, no chest pain, no fever. Reports feeling better this am  PE Gen: obese awake alert in no acute distres CV RRR no mgr trace-1+LE edema Resp: mild to moderate increased work of breathing. Using abdominal accessory muscles, frequent tight high pitched cough, difficulty completing sentence.  Abd: obese soft +BS throughout    Connie BraunKaren m. Black, NP  Patient was seen, examined,treatment plan was discussed with the Advance Practice Provider.  I have personally reviewed the clinical findings, labs, EKG, imaging studies and management of this patient in detail. I have also reviewed the orders written for this patient which were under my direction. I agree with the documentation, as recorded by the Advance Practice Provider.   Echo with EF of 30% with diffuse hypokinesis and increased BP.  Also appears to be having URI type symptoms.  Will also treat this.     Connie ArtJessica U Amoura Ransier, DO

## 2018-04-04 NOTE — Progress Notes (Addendum)
[facility] provided with [facility], verbalized understanding of how to use.

## 2018-04-05 DIAGNOSIS — I5021 Acute systolic (congestive) heart failure: Secondary | ICD-10-CM

## 2018-04-05 DIAGNOSIS — I169 Hypertensive crisis, unspecified: Secondary | ICD-10-CM

## 2018-04-05 DIAGNOSIS — E669 Obesity, unspecified: Secondary | ICD-10-CM

## 2018-04-05 DIAGNOSIS — N189 Chronic kidney disease, unspecified: Secondary | ICD-10-CM | POA: Diagnosis not present

## 2018-04-05 DIAGNOSIS — I130 Hypertensive heart and chronic kidney disease with heart failure and stage 1 through stage 4 chronic kidney disease, or unspecified chronic kidney disease: Secondary | ICD-10-CM | POA: Diagnosis not present

## 2018-04-05 DIAGNOSIS — I509 Heart failure, unspecified: Secondary | ICD-10-CM | POA: Diagnosis not present

## 2018-04-05 LAB — BASIC METABOLIC PANEL
Anion gap: 13 (ref 5–15)
BUN: 17 mg/dL (ref 6–20)
CO2: 26 mmol/L (ref 22–32)
CREATININE: 1.29 mg/dL — AB (ref 0.44–1.00)
Calcium: 9.2 mg/dL (ref 8.9–10.3)
Chloride: 102 mmol/L (ref 98–111)
GFR calc Af Amer: 56 mL/min — ABNORMAL LOW (ref >60)
GFR calc non Af Amer: 48 mL/min — ABNORMAL LOW (ref >60)
Glucose, Bld: 197 mg/dL — ABNORMAL HIGH (ref 70–99)
Potassium: 3.7 mmol/L (ref 3.5–5.1)
Sodium: 141 mmol/L (ref 135–145)

## 2018-04-05 MED ORDER — FUROSEMIDE 40 MG PO TABS: 40.0000 mg | ORAL_TABLET | Freq: Every day | ORAL | 0 refills | Status: DC

## 2018-04-05 MED ORDER — SPIRONOLACTONE 25 MG PO TABS: 12.5000 mg | ORAL_TABLET | Freq: Every day | ORAL | 0 refills | Status: DC

## 2018-04-05 MED ORDER — HYDRALAZINE HCL 50 MG PO TABS: 50.0000 mg | ORAL_TABLET | Freq: Three times a day (TID) | ORAL | 0 refills | Status: DC

## 2018-04-05 MED ORDER — CARVEDILOL 6.25 MG PO TABS: 6.2500 mg | ORAL_TABLET | Freq: Two times a day (BID) | ORAL | 0 refills | Status: DC

## 2018-04-05 MED ORDER — BENZONATATE 100 MG PO CAPS: 100.0000 mg | ORAL_CAPSULE | Freq: Two times a day (BID) | ORAL | 0 refills | Status: DC | PRN

## 2018-04-05 MED ORDER — LOSARTAN POTASSIUM 100 MG PO TABS: 100.0000 mg | ORAL_TABLET | Freq: Every day | ORAL | 0 refills | Status: DC

## 2018-04-05 NOTE — Discharge Summary (Signed)
Physician Discharge Summary  Connie Shepard ZOX:[id number] DOB: [birth year] DOA: 04/04/2018  PCP: Remus Loffler, PA-C  Admit date: 04/04/2018 Discharge date: 04/05/2018  Time spent: 45 minutes  Recommendations for Outpatient Follow-up:  1. Cardiology 4-6 weeks for schedule of cath 2. PCP 1 week for evaluation of BP control and DM control. Recommend bmet monitor renal function   Discharge Diagnoses:  Principal Problem:   Hypertensive crisis Active Problems:   Acute CHF (congestive heart failure) (HCC)   Mild renal insufficiency   Bronchitis   Hypertension   Elevated troponin   Obesity   Discharge Condition: stable  Diet recommendation: heart healthy  Filed Weights   04/04/18 0707 04/05/18 0645  Weight: 132.3 kg 129.5 kg    History of present illness:  Patient admitted 12/20 with respiratory distress related to acute heart failure in setting of hypertensive crisis and possible acute on chronic bronchitis. Patient has not had meds for over a year.  Provided with lasix IV and labatelol x2 and hydralazine TID. BP remained elevated. Chart review indicates past meds include losartan, HCTZ, metoprolol and hydralazine.   Hospital Course:   1. Acute CHF  - Presented with SOB, bilateral leg swelling, and fatigue  - Found to have peripheral edema, BNP 979, CXR with CM, rales on exam  - likely related to ongoing uncontrolled BP in setting of non-compliance  - Provided with IV Lasix and diuresed well. Echo reveals ef 30%, mild LVH, diffuse hypokinesis and grade 2 DD.   -evaluated by cards who recommend r and L cath in 4-6 weeks.   2. Hypertensive urgency  - BP as high as 200/140 in ED. Continued to be difficult to control.  - No headache or neuro deficits, no chest pain. - will discharge with po lasix, BB hydralazine and losartan as well as aldactone  3. Mild renal insufficiency  - SCr is 1.30 on admission and 1.29 at discharge.   - Possibly acute and secondary to severe HTN  or hypervolemia  - OP follow up as home meds include lasix and ARB   4. Elevated troponin  - POC troponin is 0.11 in ED without chest pain or acute ischemic features on EKG . Subsequent trops elevated 0.22-0.24. Likely reflects demand ischemia in setting of severe HTN and respiratory distress.  She was given ASA 324 mg in ED. No events on tele. See #1   Procedures: Echo revealing Moderate to severe global reduction in LV systolic functon; mild   LVH; moderate diastolic dysfunction; mild LAE; small pericardial  effusion.  Consultations:  [facility] cards  Discharge Exam: Vitals:   04/05/18 0800 04/05/18 1015  BP: (!) 164/107 (!) 144/87  Pulse: 94   Resp: 19   Temp:    SpO2: 100%     General: obese in no acute distress Cardiovascular: rrr no MGR no LE edema Respiratory: mild increased work of breathing with conversation. BS distant but clear  Discharge Instructions   Discharge Instructions    (HEART FAILURE PATIENTS) Call MD:  Anytime you have any of the following symptoms: 1) 3 pound weight gain in 24 hours or 5 pounds in 1 week 2) shortness of breath, with or without a dry hacking cough 3) swelling in the hands, feet or stomach 4) if you have to sleep on extra pillows at night in order to breathe.   Complete by:  As directed    (HEART FAILURE PATIENTS) Call MD:  Anytime you have any of the following symptoms: 1) 3 pound weight  gain in 24 hours or 5 pounds in 1 week 2) shortness of breath, with or without a dry hacking cough 3) swelling in the hands, feet or stomach 4) if you have to sleep on extra pillows at night in order to breathe.   Complete by:  As directed    Diet - low sodium heart healthy   Complete by:  As directed    Discharge instructions   Complete by:  As directed    Can purchase BP cuff to monitor BP at home Follow up with cardiology in 4-6 weeks to determine if further work up needed in regards to cath Follow up with PCP for antidepressant Outpatient sleep  study   Increase activity slowly   Complete by:  As directed      Allergies as of 04/05/2018   No Known Allergies     Medication List    STOP taking these medications   amphetamine-dextroamphetamine 20 MG 24 hr capsule Commonly known as:  ADDERALL XR   buPROPion 150 MG 24 hr tablet Commonly known as:  WELLBUTRIN XL   losartan-hydrochlorothiazide 100-25 MG tablet Commonly known as:  HYZAAR     TAKE these medications   benzonatate 100 MG capsule Commonly known as:  TESSALON Take 1 capsule (100 mg total) by mouth 2 (two) times daily as needed for cough.   carvedilol 6.25 MG tablet Commonly known as:  COREG Take 1 tablet (6.25 mg total) by mouth 2 (two) times daily with a meal.   furosemide 40 MG tablet Commonly known as:  LASIX Take 1 tablet (40 mg total) by mouth daily.   hydrALAZINE 50 MG tablet Commonly known as:  APRESOLINE Take 1 tablet (50 mg total) by mouth 3 (three) times daily.   losartan 100 MG tablet Commonly known as:  COZAAR Take 1 tablet (100 mg total) by mouth daily. Start taking on:  April 06, 2018   spironolactone 25 MG tablet Commonly known as:  ALDACTONE Take 0.5 tablets (12.5 mg total) by mouth daily. Start taking on:  April 06, 2018      No Known Allergies Follow-up Information    Remus LofflerJones, Angel S, PA-C Follow up in 1 week(s).   Specialties:  Physician Assistant, Family Medicine Why:  bmp and BP check Contact information: [address] [place] [address] [phone]        Tonny Bollmanooper, Michael, MD Follow up in 6 week(s).   Specialty:  Cardiology Contact information: [address] [place] [address] [phone]            The results of significant diagnostics from this hospitalization (including imaging, microbiology, ancillary and laboratory) are listed below for reference.    Significant Diagnostic Studies: Dg Chest 2 View  Result Date: 04/04/2018 CLINICAL DATA:  Shortness of breath and  weakness EXAM: CHEST - 2 VIEW COMPARISON:  None. FINDINGS: The lungs are well inflated. There is moderate cardiomegaly. There is no focal airspace consolidation or overt pulmonary edema. There is no pleural effusion or pneumothorax. IMPRESSION: Moderate cardiomegaly without overt pulmonary edema. Electronically Signed   By: Deatra RobinsonKevin  Herman M.D.   On: 04/04/2018 05:22    Microbiology: No results found for this or any previous visit (from the past 240 hour(s)).   Labs: Basic Metabolic Panel: Recent Labs  Lab 04/04/18 0423 04/05/18 0344  NA 140 141  K 4.1 3.7  CL 101 102  CO2 27 26  GLUCOSE 166* 197*  BUN 17 17  CREATININE 1.30*  1.29*  CALCIUM 9.3 9.2   Liver Function Tests: No results for input(s): AST, ALT, ALKPHOS, BILITOT, PROT, ALBUMIN in the last 168 hours. No results for input(s): LIPASE, AMYLASE in the last 168 hours. No results for input(s): AMMONIA in the last 168 hours. CBC: Recent Labs  Lab 04/04/18 0423  WBC 9.2  NEUTROABS 6.8  HGB 15.8*  HCT 49.2*  MCV 100.0  PLT 229   Cardiac Enzymes: Recent Labs  Lab 04/04/18 0431 04/04/18 0712 04/04/18 1248 04/04/18 1845  TROPONINI 0.23* 0.24* 0.22* 0.21*   BNP: BNP (last 3 results) Recent Labs    04/04/18 0423  BNP 979.4*    ProBNP (last 3 results) No results for input(s): PROBNP in the last 8760 hours.  CBG: No results for input(s): GLUCAP in the last 168 hours.     SignedGwenyth Bender:  Caliana Spires M NP [facility] 04/05/2018, 11:24 AM

## 2018-04-05 NOTE — Discharge Instructions (Signed)
DASH Eating Plan °DASH stands for "Dietary Approaches to Stop Hypertension." The DASH eating plan is a healthy eating plan that has been shown to reduce high blood pressure (hypertension). It may also reduce your risk for type 2 diabetes, heart disease, and stroke. The DASH eating plan may also help with weight loss. °What are tips for following this plan? ° °General guidelines °· Avoid eating more than 2,300 mg (milligrams) of salt (sodium) a day. If you have hypertension, you may need to reduce your sodium intake to 1,500 mg a day. °· Limit alcohol intake to no more than 1 drink a day for nonpregnant women and 2 drinks a day for men. One drink equals 12 oz of beer, 5 oz of wine, or 1½ oz of hard liquor. °· Work with your health care provider to maintain a healthy body weight or to lose weight. Ask what an ideal weight is for you. °· Get at least 30 minutes of exercise that causes your heart to beat faster (aerobic exercise) most days of the week. Activities may include walking, swimming, or biking. °· Work with your health care provider or diet and nutrition specialist (dietitian) to adjust your eating plan to your individual calorie needs. °Reading food labels ° °· Check food labels for the amount of sodium per serving. Choose foods with less than 5 percent of the Daily Value of sodium. Generally, foods with less than 300 mg of sodium per serving fit into this eating plan. °· To find whole grains, look for the word "whole" as the first word in the ingredient list. °Shopping °· Buy products labeled as "low-sodium" or "no salt added." °· Buy fresh foods. Avoid canned foods and premade or frozen meals. °Cooking °· Avoid adding salt when cooking. Use salt-free seasonings or herbs instead of table salt or sea salt. Check with your health care provider or pharmacist before using salt substitutes. °· Do not fry foods. Cook foods using healthy methods such as baking, boiling, grilling, and broiling instead. °· Cook with  heart-healthy oils, such as olive, canola, soybean, or sunflower oil. °Meal planning °· Eat a balanced diet that includes: °? 5 or more servings of fruits and vegetables each day. At each meal, try to fill half of your plate with fruits and vegetables. °? Up to 6-8 servings of whole grains each day. °? Less than 6 oz of lean meat, poultry, or fish each day. A 3-oz serving of meat is about the same size as a deck of cards. One egg equals 1 oz. °? 2 servings of low-fat dairy each day. °? A serving of nuts, seeds, or beans 5 times each week. °? Heart-healthy fats. Healthy fats called Omega-3 fatty acids are found in foods such as flaxseeds and coldwater fish, like sardines, salmon, and mackerel. °· Limit how much you eat of the following: °? Canned or prepackaged foods. °? Food that is high in trans fat, such as fried foods. °? Food that is high in saturated fat, such as fatty meat. °? Sweets, desserts, sugary drinks, and other foods with added sugar. °? Full-fat dairy products. °· Do not salt foods before eating. °· Try to eat at least 2 vegetarian meals each week. °· Eat more home-cooked food and less restaurant, buffet, and fast food. °· When eating at a restaurant, ask that your food be prepared with less salt or no salt, if possible. °What foods are recommended? °The items listed may not be a complete list. Talk with your dietitian about   what dietary choices are best for you. °Grains °Whole-grain or whole-wheat bread. Whole-grain or whole-wheat pasta. Brown rice. Oatmeal. Quinoa. Bulgur. Whole-grain and low-sodium cereals. Pita bread. Low-fat, low-sodium crackers. Whole-wheat flour tortillas. °Vegetables °Fresh or frozen vegetables (raw, steamed, roasted, or grilled). Low-sodium or reduced-sodium tomato and vegetable juice. Low-sodium or reduced-sodium tomato sauce and tomato paste. Low-sodium or reduced-sodium canned vegetables. °Fruits °All fresh, dried, or frozen fruit. Canned fruit in natural juice (without  added sugar). °Meat and other protein foods °Skinless chicken or turkey. Ground chicken or turkey. Pork with fat trimmed off. Fish and seafood. Egg whites. Dried beans, peas, or lentils. Unsalted nuts, nut butters, and seeds. Unsalted canned beans. Lean cuts of beef with fat trimmed off. Low-sodium, lean deli meat. °Dairy °Low-fat (1%) or fat-free (skim) milk. Fat-free, low-fat, or reduced-fat cheeses. Nonfat, low-sodium ricotta or cottage cheese. Low-fat or nonfat yogurt. Low-fat, low-sodium cheese. °Fats and oils °Soft margarine without trans fats. Vegetable oil. Low-fat, reduced-fat, or light mayonnaise and salad dressings (reduced-sodium). Canola, safflower, olive, soybean, and sunflower oils. Avocado. °Seasoning and other foods °Herbs. Spices. Seasoning mixes without salt. Unsalted popcorn and pretzels. Fat-free sweets. °What foods are not recommended? °The items listed may not be a complete list. Talk with your dietitian about what dietary choices are best for you. °Grains °Baked goods made with fat, such as croissants, muffins, or some breads. Dry pasta or rice meal packs. °Vegetables °Creamed or fried vegetables. Vegetables in a cheese sauce. Regular canned vegetables (not low-sodium or reduced-sodium). Regular canned tomato sauce and paste (not low-sodium or reduced-sodium). Regular tomato and vegetable juice (not low-sodium or reduced-sodium). Pickles. Olives. °Fruits °Canned fruit in a light or heavy syrup. Fried fruit. Fruit in cream or butter sauce. °Meat and other protein foods °Fatty cuts of meat. Ribs. Fried meat. Bacon. Sausage. Bologna and other processed lunch meats. Salami. Fatback. Hotdogs. Bratwurst. Salted nuts and seeds. Canned beans with added salt. Canned or smoked fish. Whole eggs or egg yolks. Chicken or turkey with skin. °Dairy °Whole or 2% milk, cream, and half-and-half. Whole or full-fat cream cheese. Whole-fat or sweetened yogurt. Full-fat cheese. Nondairy creamers. Whipped toppings.  Processed cheese and cheese spreads. °Fats and oils °Butter. Stick margarine. Lard. Shortening. Ghee. Bacon fat. Tropical oils, such as coconut, palm kernel, or palm oil. °Seasoning and other foods °Salted popcorn and pretzels. Onion salt, garlic salt, seasoned salt, table salt, and sea salt. Worcestershire sauce. Tartar sauce. Barbecue sauce. Teriyaki sauce. Soy sauce, including reduced-sodium. Steak sauce. Canned and packaged gravies. Fish sauce. Oyster sauce. Cocktail sauce. Horseradish that you find on the shelf. Ketchup. Mustard. Meat flavorings and tenderizers. Bouillon cubes. Hot sauce and Tabasco sauce. Premade or packaged marinades. Premade or packaged taco seasonings. Relishes. Regular salad dressings. °Where to find more information: °· National Heart, Lung, and Blood Institute: www.nhlbi.nih.gov °· American Heart Association: www.heart.org °Summary °· The DASH eating plan is a healthy eating plan that has been shown to reduce high blood pressure (hypertension). It may also reduce your risk for type 2 diabetes, heart disease, and stroke. °· With the DASH eating plan, you should limit salt (sodium) intake to 2,300 mg a day. If you have hypertension, you may need to reduce your sodium intake to 1,500 mg a day. °· When on the DASH eating plan, aim to eat more fresh fruits and vegetables, whole grains, lean proteins, low-fat dairy, and heart-healthy fats. °· Work with your health care provider or diet and nutrition specialist (dietitian) to adjust your eating plan to your   individual calorie needs. This information is not intended to replace advice given to you by your health care provider. Make sure you discuss any questions you have with your health care provider. Document Released: 03/22/2011 Document Revised: 03/26/2016 Document Reviewed: 03/26/2016 Elsevier Interactive Patient Education  2019 [employer]   How to Take Your Blood Pressure You can take your blood pressure at home with a machine.  You may need to check your blood pressure at home:  To check if you have high blood pressure (hypertension).  To check your blood pressure over time.  To make sure your blood pressure medicine is working. Supplies needed: You will need a blood pressure machine, or monitor. You can buy one at a drugstore or online. When choosing one:  Choose one with an arm cuff.  Choose one that wraps around your upper arm. Only one finger should fit between your arm and the cuff.  Do not choose one that measures your blood pressure from your wrist or finger. Your doctor can suggest a monitor. How to prepare Avoid these things for 30 minutes before checking your blood pressure:  Drinking caffeine.  Drinking alcohol.  Eating.  Smoking.  Exercising. Five minutes before checking your blood pressure:  Pee.  Sit in a dining chair. Avoid sitting in a soft couch or armchair.  Be quiet. Do not talk. How to take your blood pressure Follow the instructions that came with your machine. If you have a digital blood pressure monitor, these may be the instructions: 1. Sit up straight. 2. Place your feet on the floor. Do not cross your ankles or legs. 3. Rest your left arm at the level of your heart. You may rest it on a table, desk, or chair. 4. Pull up your shirt sleeve. 5. Wrap the blood pressure cuff around the upper part of your left arm. The cuff should be 1 inch (2.5 cm) above your elbow. It is best to wrap the cuff around bare skin. 6. Fit the cuff snugly around your arm. You should be able to place only one finger between the cuff and your arm. 7. Put the cord inside the groove of your elbow. 8. Press the power button. 9. Sit quietly while the cuff fills with air and loses air. 10. Write down the numbers on the screen. 11. Wait 2-3 minutes and then repeat steps 1-10. What do the numbers mean? Two numbers make up your blood pressure. The first number is called systolic pressure. The second is  called diastolic pressure. An example of a blood pressure reading is "120 over 80" (or 120/80). If you are an adult and do not have a medical condition, use this guide to find out if your blood pressure is normal: Normal  First number: below 120.  Second number: below 80. Elevated  First number: 120-129.  Second number: below 80. Hypertension stage 1  First number: 130-139.  Second number: 80-89. Hypertension stage 2  First number: 140 or above.  Second number: 90 or above. Your blood pressure is above normal even if only the top or bottom number is above normal. Follow these instructions at home:  Check your blood pressure as often as your doctor tells you to.  Take your monitor to your next doctor's appointment. Your doctor will: ? Make sure you are using it correctly. ? Make sure it is working right.  Make sure you understand what your blood pressure numbers should be.  Tell your doctor if your medicines are causing  side effects. Contact a doctor if:  Your blood pressure keeps being high. Get help right away if:  Your first blood pressure number is higher than 180.  Your second blood pressure number is higher than 120. This information is not intended to replace advice given to you by your health care provider. Make sure you discuss any questions you have with your health care provider. Document Released: 03/15/2008 Document Revised: 02/29/2016 Document Reviewed: 09/09/2015 Elsevier Interactive Patient Education  2019 [employer].   Hypertension Hypertension, commonly called high blood pressure, is when the force of blood pumping through the arteries is too strong. The arteries are the blood vessels that carry blood from the heart throughout the body. Hypertension forces the heart to work harder to pump blood and may cause arteries to become narrow or stiff. Having untreated or uncontrolled hypertension can cause heart attacks, strokes, kidney disease, and other  problems. A blood pressure reading consists of a higher number over a lower number. Ideally, your blood pressure should be below 120/80. The first ("top") number is called the systolic pressure. It is a measure of the pressure in your arteries as your heart beats. The second ("bottom") number is called the diastolic pressure. It is a measure of the pressure in your arteries as the heart relaxes. What are the causes? The cause of this condition is not known. What increases the risk? Some risk factors for high blood pressure are under your control. Others are not. Factors you can change  Smoking.  Having type 2 diabetes mellitus, high cholesterol, or both.  Not getting enough exercise or physical activity.  Being overweight.  Having too much fat, sugar, calories, or salt (sodium) in your diet.  Drinking too much alcohol. Factors that are difficult or impossible to change  Having chronic kidney disease.  Having a family history of high blood pressure.  Age. Risk increases with age.  Race. You may be at higher risk if you are African-American.  Gender. Men are at higher risk than women before age 85. After age 88, women are at higher risk than men.  Having obstructive sleep apnea.  Stress. What are the signs or symptoms? Extremely high blood pressure (hypertensive crisis) may cause:  Headache.  Anxiety.  Shortness of breath.  Nosebleed.  Nausea and vomiting.  Severe chest pain.  Jerky movements you cannot control (seizures). How is this diagnosed? This condition is diagnosed by measuring your blood pressure while you are seated, with your arm resting on a surface. The cuff of the blood pressure monitor will be placed directly against the skin of your upper arm at the level of your heart. It should be measured at least twice using the same arm. Certain conditions can cause a difference in blood pressure between your right and left arms. Certain factors can cause blood  pressure readings to be lower or higher than normal (elevated) for a short period of time:  When your blood pressure is higher when you are in a health care provider's office than when you are at home, this is called white coat hypertension. Most people with this condition do not need medicines.  When your blood pressure is higher at home than when you are in a health care provider's office, this is called masked hypertension. Most people with this condition may need medicines to control blood pressure. If you have a high blood pressure reading during one visit or you have normal blood pressure with other risk factors:  You may be  asked to return on a different day to have your blood pressure checked again.  You may be asked to monitor your blood pressure at home for 1 week or longer. If you are diagnosed with hypertension, you may have other blood or imaging tests to help your health care provider understand your overall risk for other conditions. How is this treated? This condition is treated by making healthy lifestyle changes, such as eating healthy foods, exercising more, and reducing your alcohol intake. Your health care provider may prescribe medicine if lifestyle changes are not enough to get your blood pressure under control, and if:  Your systolic blood pressure is above 130.  Your diastolic blood pressure is above 80. Your personal target blood pressure may vary depending on your medical conditions, your age, and other factors. Follow these instructions at home: Eating and drinking   Eat a diet that is high in fiber and potassium, and low in sodium, added sugar, and fat. An example eating plan is called the DASH (Dietary Approaches to Stop Hypertension) diet. To eat this way: ? Eat plenty of fresh fruits and vegetables. Try to fill half of your plate at each meal with fruits and vegetables. ? Eat whole grains, such as whole wheat pasta, brown rice, or whole grain bread. Fill about  one quarter of your plate with whole grains. ? Eat or drink low-fat dairy products, such as skim milk or low-fat yogurt. ? Avoid fatty cuts of meat, processed or cured meats, and poultry with skin. Fill about one quarter of your plate with lean proteins, such as fish, chicken without skin, beans, eggs, and tofu. ? Avoid premade and processed foods. These tend to be higher in sodium, added sugar, and fat.  Reduce your daily sodium intake. Most people with hypertension should eat less than 1,500 mg of sodium a day.  Limit alcohol intake to no more than 1 drink a day for nonpregnant women and 2 drinks a day for men. One drink equals 12 oz of beer, 5 oz of wine, or 1 oz of hard liquor. Lifestyle   Work with your health care provider to maintain a healthy body weight or to lose weight. Ask what an ideal weight is for you.  Get at least 30 minutes of exercise that causes your heart to beat faster (aerobic exercise) most days of the week. Activities may include walking, swimming, or biking.  Include exercise to strengthen your muscles (resistance exercise), such as pilates or lifting weights, as part of your weekly exercise routine. Try to do these types of exercises for 30 minutes at least 3 days a week.  Do not use any products that contain nicotine or tobacco, such as cigarettes and e-cigarettes. If you need help quitting, ask your health care provider.  Monitor your blood pressure at home as told by your health care provider.  Keep all follow-up visits as told by your health care provider. This is important. Medicines  Take over-the-counter and prescription medicines only as told by your health care provider. Follow directions carefully. Blood pressure medicines must be taken as prescribed.  Do not skip doses of blood pressure medicine. Doing this puts you at risk for problems and can make the medicine less effective.  Ask your health care provider about side effects or reactions to  medicines that you should watch for. Contact a health care provider if:  You think you are having a reaction to a medicine you are taking.  You have headaches that keep  coming back (recurring).  You feel dizzy.  You have swelling in your ankles.  You have trouble with your vision. Get help right away if:  You develop a severe headache or confusion.  You have unusual weakness or numbness.  You feel faint.  You have severe pain in your chest or abdomen.  You vomit repeatedly.  You have trouble breathing. Summary  Hypertension is when the force of blood pumping through your arteries is too strong. If this condition is not controlled, it may put you at risk for serious complications.  Your personal target blood pressure may vary depending on your medical conditions, your age, and other factors. For most people, a normal blood pressure is less than 120/80.  Hypertension is treated with lifestyle changes, medicines, or a combination of both. Lifestyle changes include weight loss, eating a healthy, low-sodium diet, exercising more, and limiting alcohol. This information is not intended to replace advice given to you by your health care provider. Make sure you discuss any questions you have with your health care provider. Document Released: 04/02/2005 Document Revised: 02/29/2016 Document Reviewed: 02/29/2016 Elsevier Interactive Patient Education  2019 [employer]

## 2018-04-05 NOTE — Progress Notes (Signed)
Progress Note  Patient Name: Connie CapronDenise Shepard Date of Encounter: 04/05/2018  Primary Cardiologist: Excell Seltzerooper  Subjective   Discussed timing of heart cath she prefers to defer. 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 carvedilol  6.25 mg Oral BID WC  . enoxaparin (LOVENOX) injection  40 mg Subcutaneous Q24H  . furosemide  40 mg Intravenous BID  . hydrALAZINE  50 mg Oral TID  . losartan  100 mg Oral Daily  . methylPREDNISolone (SOLU-MEDROL) injection  60 mg Intravenous Q12H  . sodium chloride flush  3 mL Intravenous Q12H  . spironolactone  12.5 mg Oral Daily   Continuous Infusions: . sodium chloride     PRN Meds: sodium chloride, acetaminophen, benzonatate, hydrALAZINE, nitroGLYCERIN, ondansetron (ZOFRAN) IV, sodium chloride flush   Vital Signs    Vitals:   04/04/18 2019 04/05/18 0645 04/05/18 0800 04/05/18 1015  BP: 138/77 (!) 156/104 (!) 164/107 (!) 144/87  Pulse: 93 93 94   Resp: 18 18 19    Temp: 97.9 F (36.6 C) 98 F (36.7 C)    TempSrc: Oral Oral    SpO2: 93% 94% 100%   Weight:  129.5 kg    Height:        Intake/Output Summary (Last 24 hours) at 04/05/2018 1038 Last data filed at 04/05/2018 0855 Gross per 24 hour  Intake 484 ml  Output -  Net 484 ml   Filed Weights   04/04/18 0707 04/05/18 0645  Weight: 132.3 kg 129.5 kg    Telemetry    NSR 04/05/2018  - Personally Reviewed  ECG    SR anterolateral T wave changes  - Personally Reviewed  Physical Exam  Obese white female  GEN: No acute distress.   Neck: No JVD Cardiac: RRR, no murmurs, rubs, S4 gallop  Respiratory: Clear to auscultation bilaterally. GI: Soft, nontender, non-distended  MS: No edema; No deformity. Neuro:  Nonfocal  Psych: Normal affect   Labs     Chemistry Recent Labs  Lab 04/04/18 0423 04/05/18 0344  NA 140 141  K 4.1 3.7  CL 101 102  CO2 27 26  GLUCOSE 166* 197*  BUN 17 17  CREATININE 1.30* 1.29*  CALCIUM 9.3 9.2  GFRNONAA 48* 48*  GFRAA 55* 56*  ANIONGAP 12 13     Hematology Recent Labs  Lab 04/04/18 0423  WBC 9.2  RBC 4.92  HGB 15.8*  HCT 49.2*  MCV 100.0  MCH 32.1  MCHC 32.1  RDW 15.8*  PLT 229    Cardiac Enzymes Recent Labs  Lab 04/04/18 0431 04/04/18 0712 04/04/18 1248 04/04/18 1845  TROPONINI 0.23* 0.24* 0.22* 0.21*    Recent Labs  Lab 04/04/18 0425  TROPIPOC 0.11*     BNP Recent Labs  Lab 04/04/18 0423  BNP 979.4*     DDimer No results for input(s): DDIMER in the last 168 hours.   Radiology    Dg Chest 2 View  Result Date: 04/04/2018 CLINICAL DATA:  Shortness of breath and weakness EXAM: CHEST - 2 VIEW COMPARISON:  None. FINDINGS: The lungs are well inflated. There is moderate cardiomegaly. There is no focal airspace consolidation or overt pulmonary edema. There is no pleural effusion or pneumothorax. IMPRESSION: Moderate cardiomegaly without overt pulmonary  edema. Electronically Signed   By: Deatra RobinsonKevin  Herman M.D.   On: 04/04/2018 05:22    Cardiac Studies   EF 30-35% grade 2 diastolic no significant RWMA or valve disease   Patient Profile     52 y.o. female admitted with poorly controlled HTN and DM. 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 Continue current meds  HTN:  Improved on Rx   [facility] will sign off.   Medication Recommendations:  See current orders  Other recommendations (labs, testing, etc):  Right and left cath in 4-6 weeks  Follow up as an outpatient:  Dr Excell Seltzerooper   For questions  or updates, please contact [facility] Please consult [email or web address] for contact info under        Signed, Charlton HawsPeter Charlee Whitebread, MD  04/05/2018, 10:38 AM

## 2018-04-05 NOTE — Plan of Care (Signed)
  Problem: Education: Goal: Knowledge of General Education information will improve Description Including pain rating scale, medication(s)/side effects and non-pharmacologic comfort measures Outcome: Progressing   

## 2018-04-07 ENCOUNTER — Telehealth: Payer: Self-pay | Admitting: Physician Assistant

## 2018-04-07 NOTE — Telephone Encounter (Signed)
New message   [facility] Tereso NewcomerScott Weaver 04/22/2018 at 11:45 am.

## 2018-04-08 NOTE — Telephone Encounter (Signed)
Left message to call back  

## 2018-04-10 NOTE — Telephone Encounter (Signed)
TOC lpmtcb 12/26 2nd attempt

## 2018-04-11 NOTE — Telephone Encounter (Signed)
[facility].  Patient understands to follow up with provider Tereso NewcomerScott Weaver [facility] Patient understands discharge instructions? yes Patient understands medications and regiment? yes Patient understands to bring all medications to this visit? yes

## 2018-04-22 ENCOUNTER — Ambulatory Visit: Payer: Self-pay | Admitting: Physician Assistant

## 2018-04-30 ENCOUNTER — Encounter: Payer: Self-pay | Admitting: Physician Assistant

## 2018-04-30 ENCOUNTER — Ambulatory Visit (INDEPENDENT_AMBULATORY_CARE_PROVIDER_SITE_OTHER): Payer: 59 | Admitting: Physician Assistant

## 2018-04-30 VITALS — BP 146/89 | HR 94 | Temp 97.5°F | Ht 63.0 in | Wt 265.0 lb

## 2018-04-30 DIAGNOSIS — F32A Depression, unspecified: Secondary | ICD-10-CM

## 2018-04-30 DIAGNOSIS — F329 Major depressive disorder, single episode, unspecified: Secondary | ICD-10-CM

## 2018-04-30 DIAGNOSIS — I10 Essential (primary) hypertension: Secondary | ICD-10-CM

## 2018-04-30 MED ORDER — ESCITALOPRAM OXALATE 10 MG PO TABS: 10.0000 mg | ORAL_TABLET | Freq: Every day | ORAL | 2 refills | Status: DC

## 2018-04-30 MED ORDER — CARVEDILOL 6.25 MG PO TABS: 6.2500 mg | ORAL_TABLET | Freq: Two times a day (BID) | ORAL | 0 refills | Status: DC

## 2018-04-30 MED ORDER — HYDRALAZINE HCL 50 MG PO TABS: 25.0000 mg | ORAL_TABLET | Freq: Three times a day (TID) | ORAL | 0 refills | Status: DC

## 2018-04-30 MED ORDER — LOSARTAN POTASSIUM 100 MG PO TABS: 100.0000 mg | ORAL_TABLET | Freq: Every day | ORAL | 0 refills | Status: DC

## 2018-04-30 MED ORDER — FUROSEMIDE 40 MG PO TABS: 40.0000 mg | ORAL_TABLET | Freq: Every day | ORAL | 0 refills | Status: DC

## 2018-04-30 MED ORDER — SPIRONOLACTONE 25 MG PO TABS: 25.0000 mg | ORAL_TABLET | Freq: Every day | ORAL | 2 refills | Status: DC

## 2018-05-01 ENCOUNTER — Telehealth: Payer: Self-pay | Admitting: Physician Assistant

## 2018-05-01 LAB — CMP14+EGFR
ALT: 30 IU/L (ref 0–32)
AST: 21 IU/L (ref 0–40)
Albumin/Globulin Ratio: 1.2 (ref 1.2–2.2)
Albumin: 3.8 g/dL (ref 3.5–5.5)
Alkaline Phosphatase: 95 IU/L (ref 39–117)
BUN/Creatinine Ratio: 13 (ref 9–23)
BUN: 13 mg/dL (ref 6–24)
Bilirubin Total: 0.4 mg/dL (ref 0.0–1.2)
CO2: 22 mmol/L (ref 20–29)
Calcium: 9.6 mg/dL (ref 8.7–10.2)
Chloride: 102 mmol/L (ref 96–106)
Creatinine, Ser: 0.97 mg/dL (ref 0.57–1.00)
GFR calc non Af Amer: 68 mL/min/{1.73_m2} (ref >59)
GFR, EST AFRICAN AMERICAN: 78 mL/min/{1.73_m2} (ref >59)
Globulin, Total: 3.1 g/dL (ref 1.5–4.5)
Glucose: 123 mg/dL — ABNORMAL HIGH (ref 65–99)
Potassium: 3.9 mmol/L (ref 3.5–5.2)
Sodium: 142 mmol/L (ref 134–144)
Total Protein: 6.9 g/dL (ref 6.0–8.5)

## 2018-05-01 NOTE — Telephone Encounter (Signed)
done

## 2018-05-02 ENCOUNTER — Other Ambulatory Visit: Payer: Self-pay | Admitting: Physician Assistant

## 2018-05-02 NOTE — Progress Notes (Signed)
BP (!) 146/89   Pulse 94   Temp (!) 97.5 F (36.4 C) (Oral)   Ht 5' 3"  (1.6 m)   Wt 265 lb (120.2 kg)   BMI 46.94 kg/m    Subjective:    Patient ID: Connie Shepard, female    DOB: [birth year], 53 y.o.   MRN: [id number]  HPI: Connie Shepard is a 53 y.o. female presenting on 04/30/2018 for Hospitalization Follow-up  This patient comes in for a hospital follow-up.  She was admitted for a hypertensive crisis.  Due to insurance issues she had not really been taking her regular medication over the past 2 years.  So therefore blood pressure gotten quite out of control.  She has had longstanding blood pressure issues.  While she was there she did have thyroid injection fraction at 45%.  At this time her blood pressure is immensely better.  She is feeling much better.  Even on recheck here in the office I found her final blood pressure at the office visit to be 138/88.  She started out at 156/96 but was somewhat anxious about coming in for the visit.  She reports that overall she is feeling much better.  She still having some fatigue with walking.  But she hopes for that to continue to be built up.  I have encouraged her to do some slow walking for 10 to 15 minutes at a time on a flat surface is like her yard her driveway.  She does have cardiology appointments in the next couple weeks.   She is having a little bit of trouble getting her middle hydralazine and and has a significant dry mouth.  We made one little bit of an adjustment in that she with lower her hydralazine to 25 mg 3 times a day and adjust Aldactone.  And we will see her back in 4 weeks.  She will also come back in about 1 week for recheck on her labs.  The orders placed.  Past Medical History:  Diagnosis Date  . Acute heart failure ([facility])   . ADD (attention deficit disorder)   . Bronchitis   . Depression   . Elevated troponin   . Hypertension   . Obesity    Relevant past medical, surgical, family and social history reviewed and  updated as indicated. Interim medical history since our last visit reviewed. Allergies and medications reviewed and updated. DATA REVIEWED: CHART IN EPIC  Family History reviewed for pertinent findings.  Review of Systems  Constitutional: Positive for fatigue. Negative for activity change and fever.  HENT: Negative.   Eyes: Negative.   Respiratory: Positive for shortness of breath. Negative for cough and wheezing.   Cardiovascular: Negative.  Negative for chest pain.  Gastrointestinal: Negative.  Negative for abdominal pain.  Endocrine: Negative.   Genitourinary: Negative.  Negative for dysuria.  Musculoskeletal: Negative.   Skin: Negative.   Neurological: Negative.     Allergies as of 04/30/2018   No Known Allergies     Medication List       Accurate as of April 30, 2018 11:59 PM. Always use your most recent med list.        benzonatate 100 MG capsule Commonly known as:  TESSALON Take 1 capsule (100 mg total) by mouth 2 (two) times daily as needed for cough.   carvedilol 6.25 MG tablet Commonly known as:  COREG Take 1 tablet (6.25 mg total) by mouth 2 (two) times daily with a meal.   escitalopram  10 MG tablet Commonly known as:  LEXAPRO Take 1 tablet (10 mg total) by mouth daily.   furosemide 40 MG tablet Commonly known as:  LASIX Take 1 tablet (40 mg total) by mouth daily.   hydrALAZINE 50 MG tablet Commonly known as:  APRESOLINE Take 0.5 tablets (25 mg total) by mouth 3 (three) times daily.   losartan 100 MG tablet Commonly known as:  COZAAR Take 1 tablet (100 mg total) by mouth daily.   spironolactone 25 MG tablet Commonly known as:  ALDACTONE Take 1 tablet (25 mg total) by mouth daily.          Objective:    BP (!) 146/89   Pulse 94   Temp (!) 97.5 F (36.4 C) (Oral)   Ht 5' 3"  (1.6 m)   Wt 265 lb (120.2 kg)   BMI 46.94 kg/m   No Known Allergies  Wt Readings from Last 3 Encounters:  04/30/18 265 lb (120.2 kg)  04/05/18 285 lb 6.4 oz  (129.5 kg)  09/12/16 276 lb 6.4 oz (125.4 kg)    Physical Exam Constitutional:      Appearance: She is well-developed.  HENT:     Head: Normocephalic and atraumatic.  Eyes:     Conjunctiva/sclera: Conjunctivae normal.     Pupils: Pupils are equal, round, and reactive to light.  Cardiovascular:     Rate and Rhythm: Normal rate and regular rhythm.     Heart sounds: Normal heart sounds.  Pulmonary:     Effort: Pulmonary effort is normal.     Breath sounds: Normal breath sounds.  Abdominal:     General: Bowel sounds are normal.     Palpations: Abdomen is soft.  Skin:    General: Skin is warm and dry.     Findings: No rash.  Neurological:     Mental Status: She is alert and oriented to person, place, and time.     Deep Tendon Reflexes: Reflexes are normal and symmetric.  Psychiatric:        Behavior: Behavior normal.        Thought Content: Thought content normal.        Judgment: Judgment normal.     Results for orders placed or performed in visit on 04/30/18  CMP14+EGFR  Result Value Ref Range   Glucose 123 (H) 65 - 99 mg/dL   BUN 13 6 - 24 mg/dL   Creatinine, Ser 0.97 0.57 - 1.00 mg/dL   GFR calc non Af Amer 68 >59 mL/min/1.73   GFR calc Af Amer 78 >59 mL/min/1.73   BUN/Creatinine Ratio 13 9 - 23   Sodium 142 134 - 144 mmol/L   Potassium 3.9 3.5 - 5.2 mmol/L   Chloride 102 96 - 106 mmol/L   CO2 22 20 - 29 mmol/L   Calcium 9.6 8.7 - 10.2 mg/dL   Total Protein 6.9 6.0 - 8.5 g/dL   Albumin 3.8 3.5 - 5.5 g/dL   Globulin, Total 3.1 1.5 - 4.5 g/dL   Albumin/Globulin Ratio 1.2 1.2 - 2.2   Bilirubin Total 0.4 0.0 - 1.2 mg/dL   Alkaline Phosphatase 95 39 - 117 IU/L   AST 21 0 - 40 IU/L   ALT 30 0 - 32 IU/L      Assessment & Plan:   1. Essential hypertension - hydrALAZINE (APRESOLINE) 50 MG tablet; Take 0.5 tablets (25 mg total) by mouth 3 (three) times daily.  Dispense: 45 tablet; Refill: 0 - CMP14+EGFR  2. Depression, unspecified depression  type Start Lexapro 10  mg 1 daily Recheck 4 weeks.  Continue all other maintenance medications as listed above.  Follow up plan: Return in about 4 weeks (around 05/28/2018).  Educational handout given for [place] PA-C [facility] [address]  [place],  [address] [phone]   05/02/2018, 4:31 PM

## 2018-05-08 ENCOUNTER — Other Ambulatory Visit: Payer: Self-pay | Admitting: Physician Assistant

## 2018-05-08 DIAGNOSIS — I10 Essential (primary) hypertension: Secondary | ICD-10-CM

## 2018-05-08 MED ORDER — HYDRALAZINE HCL 50 MG PO TABS: 25.0000 mg | ORAL_TABLET | Freq: Three times a day (TID) | ORAL | 2 refills | Status: DC

## 2018-05-08 NOTE — Telephone Encounter (Signed)
Rx sent for patient.

## 2018-05-09 ENCOUNTER — Other Ambulatory Visit: Payer: Self-pay | Admitting: *Deleted

## 2018-05-09 DIAGNOSIS — I10 Essential (primary) hypertension: Secondary | ICD-10-CM

## 2018-05-09 MED ORDER — HYDRALAZINE HCL 50 MG PO TABS: 25.0000 mg | ORAL_TABLET | Freq: Three times a day (TID) | ORAL | 2 refills | Status: DC

## 2018-06-02 ENCOUNTER — Other Ambulatory Visit: Payer: Self-pay | Admitting: Physician Assistant

## 2018-06-03 ENCOUNTER — Telehealth: Payer: Self-pay | Admitting: Physician Assistant

## 2018-06-03 NOTE — Telephone Encounter (Signed)
Appointment given.

## 2018-06-03 NOTE — Telephone Encounter (Signed)
Pt wants to see Connie Shepard for cough/cold and rf meds. [facility] doesn't have anything till next week.Pt doesn't want to end back up in the hospital. Please call her re: appt.

## 2018-06-04 ENCOUNTER — Ambulatory Visit (INDEPENDENT_AMBULATORY_CARE_PROVIDER_SITE_OTHER): Payer: 59 | Admitting: Physician Assistant

## 2018-06-04 ENCOUNTER — Encounter: Payer: Self-pay | Admitting: Physician Assistant

## 2018-06-04 VITALS — BP 135/73 | HR 82 | Temp 97.7°F | Ht 63.0 in | Wt 264.0 lb

## 2018-06-04 DIAGNOSIS — J208 Acute bronchitis due to other specified organisms: Secondary | ICD-10-CM

## 2018-06-04 DIAGNOSIS — F329 Major depressive disorder, single episode, unspecified: Secondary | ICD-10-CM | POA: Diagnosis not present

## 2018-06-04 DIAGNOSIS — F32A Depression, unspecified: Secondary | ICD-10-CM

## 2018-06-04 DIAGNOSIS — I10 Essential (primary) hypertension: Secondary | ICD-10-CM | POA: Diagnosis not present

## 2018-06-04 DIAGNOSIS — I5021 Acute systolic (congestive) heart failure: Secondary | ICD-10-CM

## 2018-06-04 LAB — BAYER DCA HB A1C WAIVED: HB A1C (BAYER DCA - WAIVED): 7.6 % — ABNORMAL HIGH (ref <7.0)

## 2018-06-04 MED ORDER — PREDNISONE 10 MG (21) PO TBPK: ORAL_TABLET | ORAL | 0 refills | Status: DC

## 2018-06-04 MED ORDER — SPIRONOLACTONE 25 MG PO TABS: 25.0000 mg | ORAL_TABLET | Freq: Every day | ORAL | 2 refills | Status: DC

## 2018-06-04 MED ORDER — HYDRALAZINE HCL 50 MG PO TABS: 25.0000 mg | ORAL_TABLET | Freq: Three times a day (TID) | ORAL | 2 refills | Status: DC

## 2018-06-04 MED ORDER — HYDROCODONE-HOMATROPINE 5-1.5 MG/5ML PO SYRP: 5.0000 mL | ORAL_SOLUTION | Freq: Four times a day (QID) | ORAL | 0 refills | Status: DC | PRN

## 2018-06-04 MED ORDER — CARVEDILOL 6.25 MG PO TABS: 6.2500 mg | ORAL_TABLET | Freq: Two times a day (BID) | ORAL | 5 refills | Status: DC

## 2018-06-04 MED ORDER — AZITHROMYCIN 250 MG PO TABS: ORAL_TABLET | ORAL | 0 refills | Status: DC

## 2018-06-04 MED ORDER — FUROSEMIDE 40 MG PO TABS: 40.0000 mg | ORAL_TABLET | Freq: Every day | ORAL | 2 refills | Status: DC

## 2018-06-04 MED ORDER — LOSARTAN POTASSIUM 100 MG PO TABS: 100.0000 mg | ORAL_TABLET | Freq: Every day | ORAL | 2 refills | Status: DC

## 2018-06-04 MED ORDER — ESCITALOPRAM OXALATE 20 MG PO TABS: 10.0000 mg | ORAL_TABLET | Freq: Every day | ORAL | 5 refills | Status: DC

## 2018-06-05 LAB — CMP14+EGFR
ALK PHOS: 91 IU/L (ref 39–117)
ALT: 29 IU/L (ref 0–32)
AST: 19 IU/L (ref 0–40)
Albumin/Globulin Ratio: 1.3 (ref 1.2–2.2)
Albumin: 4 g/dL (ref 3.8–4.9)
BUN/Creatinine Ratio: 16 (ref 9–23)
BUN: 16 mg/dL (ref 6–24)
Bilirubin Total: 0.3 mg/dL (ref 0.0–1.2)
CO2: 22 mmol/L (ref 20–29)
Calcium: 9.2 mg/dL (ref 8.7–10.2)
Chloride: 97 mmol/L (ref 96–106)
Creatinine, Ser: 1.01 mg/dL — ABNORMAL HIGH (ref 0.57–1.00)
GFR calc Af Amer: 74 mL/min/{1.73_m2} (ref >59)
GFR calc non Af Amer: 65 mL/min/{1.73_m2} (ref >59)
Globulin, Total: 3 g/dL (ref 1.5–4.5)
Glucose: 232 mg/dL — ABNORMAL HIGH (ref 65–99)
Potassium: 3.9 mmol/L (ref 3.5–5.2)
Sodium: 142 mmol/L (ref 134–144)
Total Protein: 7 g/dL (ref 6.0–8.5)

## 2018-06-05 LAB — CBC WITH DIFFERENTIAL/PLATELET
Basophils Absolute: 0 10*3/uL (ref 0.0–0.2)
Basos: 1 %
EOS (ABSOLUTE): 0.2 10*3/uL (ref 0.0–0.4)
Eos: 4 %
Hematocrit: 46.1 % (ref 34.0–46.6)
Hemoglobin: 16.2 g/dL — ABNORMAL HIGH (ref 11.1–15.9)
Immature Grans (Abs): 0 10*3/uL (ref 0.0–0.1)
Immature Granulocytes: 0 %
Lymphocytes Absolute: 1 10*3/uL (ref 0.7–3.1)
Lymphs: 24 %
MCH: 31.3 pg (ref 26.6–33.0)
MCHC: 35.1 g/dL (ref 31.5–35.7)
MCV: 89 fL (ref 79–97)
MONOS ABS: 0.3 10*3/uL (ref 0.1–0.9)
Monocytes: 8 %
Neutrophils Absolute: 2.4 10*3/uL (ref 1.4–7.0)
Neutrophils: 63 %
Platelets: 171 10*3/uL (ref 150–450)
RBC: 5.17 x10E6/uL (ref 3.77–5.28)
RDW: 13.2 % (ref 11.7–15.4)
WBC: 3.9 10*3/uL (ref 3.4–10.8)

## 2018-06-05 LAB — LIPID PANEL
Chol/HDL Ratio: 3.8 ratio (ref 0.0–4.4)
Cholesterol, Total: 176 mg/dL (ref 100–199)
HDL: 46 mg/dL (ref >39)
LDL Calculated: 101 mg/dL — ABNORMAL HIGH (ref 0–99)
Triglycerides: 143 mg/dL (ref 0–149)
VLDL Cholesterol Cal: 29 mg/dL (ref 5–40)

## 2018-06-05 LAB — TSH: TSH: 3.16 u[IU]/mL (ref 0.450–4.500)

## 2018-06-05 MED ORDER — METFORMIN HCL 500 MG PO TABS: 500.0000 mg | ORAL_TABLET | Freq: Every day | ORAL | 3 refills | Status: DC

## 2018-06-05 NOTE — Progress Notes (Signed)
BP 135/73   Pulse 82   Temp 97.7 F (36.5 C) (Oral)   Ht 5' 3"  (1.6 m)   Wt 264 lb (119.7 kg)   BMI 46.77 kg/m    Subjective:    Patient ID: Connie Shepard, female    DOB: 13-Jul-1965, 53 y.o.   MRN: [id number]  HPI: Connie Shepard is a 53 y.o. female presenting on 06/04/2018 for Cough; Nasal Congestion; and Medication Refill  This patient comes in for periodic recheck on medications and conditions including hypertension, CHF, depression.  She is feeling much better from her hospitalization.  She is tolerating her meds well. She has had much better blood pressure readings.    This patient has had many days of sore throat and postnasal drainage, headache at times and sinus pressure. There is copious drainage at times. Denies any fever at this time. There has been a history of sinus infections in the past.  There is cough at night. It has become more prevalent in recent days.    All medications are reviewed today. There are no reports of any problems with the medications. All of the medical conditions are reviewed and updated.  Lab work is reviewed and will be ordered as medically necessary. There are no new problems reported with today's visit.   Past Medical History:  Diagnosis Date  . Acute heart failure ([facility])   . ADD (attention deficit disorder)   . Bronchitis   . Depression   . Elevated troponin   . Hypertension   . Obesity    Relevant past medical, surgical, family and social history reviewed and updated as indicated. Interim medical history since our last visit reviewed. Allergies and medications reviewed and updated. DATA REVIEWED: CHART IN EPIC  Family History reviewed for pertinent findings.  Review of Systems  Constitutional: Positive for chills and fatigue. Negative for activity change, appetite change and fever.  HENT: Positive for congestion, postnasal drip and sore throat.   Eyes: Negative.   Respiratory: Positive for cough. Negative for wheezing.     Cardiovascular: Negative.  Negative for chest pain, palpitations and leg swelling.  Gastrointestinal: Negative.   Genitourinary: Negative.   Musculoskeletal: Negative.   Skin: Negative.   Neurological: Positive for headaches.    Allergies as of 06/04/2018   No Known Allergies     Medication List       Accurate as of June 04, 2018 11:59 PM. Always use your most recent med list.        azithromycin 250 MG tablet Commonly known as:  ZITHROMAX Z-PAK Take as directed   benzonatate 100 MG capsule Commonly known as:  TESSALON Take 1 capsule (100 mg total) by mouth 2 (two) times daily as needed for cough.   carvedilol 6.25 MG tablet Commonly known as:  COREG Take 1 tablet (6.25 mg total) by mouth 2 (two) times daily with a meal.   escitalopram 20 MG tablet Commonly known as:  LEXAPRO Take 0.5 tablets (10 mg total) by mouth daily.   furosemide 40 MG tablet Commonly known as:  LASIX Take 1 tablet (40 mg total) by mouth daily.   hydrALAZINE 50 MG tablet Commonly known as:  APRESOLINE Take 0.5 tablets (25 mg total) by mouth 3 (three) times daily.   HYDROcodone-homatropine 5-1.5 MG/5ML syrup Commonly known as:  HYCODAN Take 5-10 mLs by mouth every 6 (six) hours as needed.   losartan 100 MG tablet Commonly known as:  COZAAR Take 1 tablet (100 mg total) by mouth  daily.   metFORMIN 500 MG tablet Commonly known as:  GLUCOPHAGE Take 1 tablet (500 mg total) by mouth daily with breakfast.   predniSONE 10 MG (21) Tbpk tablet Commonly known as:  STERAPRED UNI-PAK 21 TAB As directed x 6 days   spironolactone 25 MG tablet Commonly known as:  ALDACTONE Take 1 tablet (25 mg total) by mouth daily.          Objective:    BP 135/73   Pulse 82   Temp 97.7 F (36.5 C) (Oral)   Ht 5' 3"  (1.6 m)   Wt 264 lb (119.7 kg)   BMI 46.77 kg/m   No Known Allergies  Wt Readings from Last 3 Encounters:  06/04/18 264 lb (119.7 kg)  04/30/18 265 lb (120.2 kg)  04/05/18 285 lb  6.4 oz (129.5 kg)    Physical Exam Constitutional:      Appearance: She is well-developed.  HENT:     Head: Normocephalic and atraumatic.     Right Ear: Drainage and tenderness present.     Left Ear: Drainage and tenderness present.     Nose: Mucosal edema and rhinorrhea present.     Right Sinus: No maxillary sinus tenderness or frontal sinus tenderness.     Left Sinus: No maxillary sinus tenderness or frontal sinus tenderness.     Mouth/Throat:     Pharynx: Oropharyngeal exudate and posterior oropharyngeal erythema present.  Eyes:     Conjunctiva/sclera: Conjunctivae normal.     Pupils: Pupils are equal, round, and reactive to light.  Neck:     Musculoskeletal: Normal range of motion and neck supple.  Cardiovascular:     Rate and Rhythm: Normal rate and regular rhythm.     Heart sounds: Normal heart sounds.  Pulmonary:     Effort: Pulmonary effort is normal.     Breath sounds: Examination of the right-upper field reveals wheezing. Examination of the left-upper field reveals wheezing. Wheezing present.  Abdominal:     General: Bowel sounds are normal.     Palpations: Abdomen is soft.  Skin:    General: Skin is warm and dry.     Findings: No rash.  Neurological:     Mental Status: She is alert and oriented to person, place, and time.     Deep Tendon Reflexes: Reflexes are normal and symmetric.  Psychiatric:        Behavior: Behavior normal.        Thought Content: Thought content normal.        Judgment: Judgment normal.     Results for orders placed or performed in visit on 06/04/18  CBC with Differential/Platelet  Result Value Ref Range   WBC 3.9 3.4 - 10.8 x10E3/uL   RBC 5.17 3.77 - 5.28 x10E6/uL   Hemoglobin 16.2 (H) 11.1 - 15.9 g/dL   Hematocrit 46.1 34.0 - 46.6 %   MCV 89 79 - 97 fL   MCH 31.3 26.6 - 33.0 pg   MCHC 35.1 31.5 - 35.7 g/dL   RDW 13.2 11.7 - 15.4 %   Platelets 171 150 - 450 x10E3/uL   Neutrophils 63 Not Estab. %   Lymphs 24 Not Estab. %    Monocytes 8 Not Estab. %   Eos 4 Not Estab. %   Basos 1 Not Estab. %   Neutrophils Absolute 2.4 1.4 - 7.0 x10E3/uL   Lymphocytes Absolute 1.0 0.7 - 3.1 x10E3/uL   Monocytes Absolute 0.3 0.1 - 0.9 x10E3/uL   EOS (ABSOLUTE) 0.2  0.0 - 0.4 x10E3/uL   Basophils Absolute 0.0 0.0 - 0.2 x10E3/uL   Immature Granulocytes 0 Not Estab. %   Immature Grans (Abs) 0.0 0.0 - 0.1 x10E3/uL  CMP14+EGFR  Result Value Ref Range   Glucose 232 (H) 65 - 99 mg/dL   BUN 16 6 - 24 mg/dL   Creatinine, Ser 1.01 (H) 0.57 - 1.00 mg/dL   GFR calc non Af Amer 65 >59 mL/min/1.73   GFR calc Af Amer 74 >59 mL/min/1.73   BUN/Creatinine Ratio 16 9 - 23   Sodium 142 134 - 144 mmol/L   Potassium 3.9 3.5 - 5.2 mmol/L   Chloride 97 96 - 106 mmol/L   CO2 22 20 - 29 mmol/L   Calcium 9.2 8.7 - 10.2 mg/dL   Total Protein 7.0 6.0 - 8.5 g/dL   Albumin 4.0 3.8 - 4.9 g/dL   Globulin, Total 3.0 1.5 - 4.5 g/dL   Albumin/Globulin Ratio 1.3 1.2 - 2.2   Bilirubin Total 0.3 0.0 - 1.2 mg/dL   Alkaline Phosphatase 91 39 - 117 IU/L   AST 19 0 - 40 IU/L   ALT 29 0 - 32 IU/L  TSH  Result Value Ref Range   TSH 3.160 0.450 - 4.500 uIU/mL  Bayer DCA Hb A1c Waived  Result Value Ref Range   HB A1C (BAYER DCA - WAIVED) 7.6 (H) <7.0 %  Lipid panel  Result Value Ref Range   Cholesterol, Total 176 100 - 199 mg/dL   Triglycerides 143 0 - 149 mg/dL   HDL 46 >39 mg/dL   VLDL Cholesterol Cal 29 5 - 40 mg/dL   LDL Calculated 101 (H) 0 - 99 mg/dL   Chol/HDL Ratio 3.8 0.0 - 4.4 ratio      Assessment & Plan:   1. Essential hypertension - carvedilol (COREG) 6.25 MG tablet; Take 1 tablet (6.25 mg total) by mouth 2 (two) times daily with a meal.  Dispense: 60 tablet; Refill: 5 - furosemide (LASIX) 40 MG tablet; Take 1 tablet (40 mg total) by mouth daily.  Dispense: 30 tablet; Refill: 2 - losartan (COZAAR) 100 MG tablet; Take 1 tablet (100 mg total) by mouth daily.  Dispense: 30 tablet; Refill: 2 - spironolactone (ALDACTONE) 25 MG tablet; Take 1  tablet (25 mg total) by mouth daily.  Dispense: 30 tablet; Refill: 2 - hydrALAZINE (APRESOLINE) 50 MG tablet; Take 0.5 tablets (25 mg total) by mouth 3 (three) times daily.  Dispense: 45 tablet; Refill: 2 - CBC with Differential/Platelet - CMP14+EGFR - TSH - Bayer DCA Hb A1c Waived - Lipid panel  2. Acute systolic congestive heart failure ([facility]) - CBC with Differential/Platelet - CMP14+EGFR - TSH - Bayer DCA Hb A1c Waived - Lipid panel  3. Depression, unspecified depression type - escitalopram (LEXAPRO) 20 MG tablet; Take 0.5 tablets (10 mg total) by mouth daily.  Dispense: 30 tablet; Refill: 5  4. Acute bronchitis due to other specified organisms - azithromycin (ZITHROMAX Z-PAK) 250 MG tablet; Take as directed  Dispense: 6 each; Refill: 0 - HYDROcodone-homatropine (HYCODAN) 5-1.5 MG/5ML syrup; Take 5-10 mLs by mouth every 6 (six) hours as needed.  Dispense: 240 mL; Refill: 0 - predniSONE (STERAPRED UNI-PAK 21 TAB) 10 MG (21) TBPK tablet; As directed x 6 days  Dispense: 21 tablet; Refill: 0   Continue all other maintenance medications as listed above.  Follow up plan: Return in about 3 months (around 09/02/2018) for recheck.  Educational handout given for [place]  Terald Sleeper PA-C [facility] [address]  [place], [place] [address] [phone]   06/05/2018, 7:54 PM

## 2018-07-18 ENCOUNTER — Other Ambulatory Visit: Payer: Self-pay | Admitting: Physician Assistant

## 2018-07-18 ENCOUNTER — Telehealth: Payer: Self-pay | Admitting: Physician Assistant

## 2018-07-18 DIAGNOSIS — F32A Depression, unspecified: Secondary | ICD-10-CM

## 2018-07-18 DIAGNOSIS — F329 Major depressive disorder, single episode, unspecified: Secondary | ICD-10-CM

## 2018-07-18 MED ORDER — ESCITALOPRAM OXALATE 20 MG PO TABS: 20.0000 mg | ORAL_TABLET | Freq: Every day | ORAL | 5 refills | Status: DC

## 2018-07-18 NOTE — Telephone Encounter (Signed)
Pt states she thought she was supposed to be taking 20mg  lexapro but her rx says take 1/2 tablet which would make her dose only 10mg . Please advise.

## 2018-07-18 NOTE — Telephone Encounter (Signed)
PT is needing to speak to nurse to clarify directions on escitalopram (LEXAPRO) 20 MG tablet

## 2018-07-18 NOTE — Telephone Encounter (Signed)
I sent a corrected prescription to her pharmacy.  It will be for the 20 mg tablets to be taken 1 daily

## 2018-07-18 NOTE — Telephone Encounter (Signed)
Pt aware.

## 2018-08-06 ENCOUNTER — Other Ambulatory Visit: Payer: Self-pay | Admitting: Physician Assistant

## 2018-08-06 DIAGNOSIS — I10 Essential (primary) hypertension: Secondary | ICD-10-CM

## 2018-09-03 ENCOUNTER — Ambulatory Visit (INDEPENDENT_AMBULATORY_CARE_PROVIDER_SITE_OTHER): Payer: 59 | Admitting: Physician Assistant

## 2018-09-03 ENCOUNTER — Other Ambulatory Visit: Payer: Self-pay

## 2018-09-03 ENCOUNTER — Encounter: Payer: Self-pay | Admitting: Physician Assistant

## 2018-09-03 VITALS — BP 156/96 | HR 80 | Temp 98.0°F | Ht 63.0 in | Wt 273.0 lb

## 2018-09-03 DIAGNOSIS — F5101 Primary insomnia: Secondary | ICD-10-CM | POA: Diagnosis not present

## 2018-09-03 DIAGNOSIS — F332 Major depressive disorder, recurrent severe without psychotic features: Secondary | ICD-10-CM | POA: Diagnosis not present

## 2018-09-03 DIAGNOSIS — R739 Hyperglycemia, unspecified: Secondary | ICD-10-CM

## 2018-09-03 DIAGNOSIS — I10 Essential (primary) hypertension: Secondary | ICD-10-CM | POA: Diagnosis not present

## 2018-09-03 LAB — BAYER DCA HB A1C WAIVED: HB A1C (BAYER DCA - WAIVED): 5.8 % (ref <7.0)

## 2018-09-03 MED ORDER — SPIRONOLACTONE 25 MG PO TABS: 25.0000 mg | ORAL_TABLET | Freq: Every day | ORAL | 3 refills | Status: DC

## 2018-09-03 MED ORDER — LOSARTAN POTASSIUM 100 MG PO TABS: ORAL_TABLET | ORAL | 3 refills | Status: DC

## 2018-09-03 MED ORDER — HYDROXYZINE HCL 25 MG PO TABS: 25.0000 mg | ORAL_TABLET | Freq: Three times a day (TID) | ORAL | 2 refills | Status: DC | PRN

## 2018-09-03 MED ORDER — METFORMIN HCL 500 MG PO TABS: 500.0000 mg | ORAL_TABLET | Freq: Every day | ORAL | 3 refills | Status: DC

## 2018-09-03 MED ORDER — BUPROPION HCL ER (XL) 150 MG PO TB24: 150.0000 mg | ORAL_TABLET | Freq: Every day | ORAL | 5 refills | Status: DC

## 2018-09-03 MED ORDER — FUROSEMIDE 40 MG PO TABS: ORAL_TABLET | ORAL | 3 refills | Status: DC

## 2018-09-03 MED ORDER — HYDRALAZINE HCL 50 MG PO TABS: ORAL_TABLET | ORAL | 5 refills | Status: DC

## 2018-09-03 NOTE — Progress Notes (Signed)
BP (!) 156/96   Pulse 80   Temp 98 F (36.7 C) (Oral)   Ht 5' 3"  (1.6 m)   Wt 273 lb (123.8 kg)   BMI 48.36 kg/m    Subjective:    Patient ID: Connie Shepard, female    DOB: Jul 12, 1965, 53 y.o.   MRN: [id number]  HPI: Connie Shepard is a 53 y.o. female presenting on 09/03/2018 for Hypertension (3 month )  Comes in for a 20-monthrecheck on her chronic medical conditions.  They do include hypertension, depression, hyper glycemia.  She states that she has been feeling very good overall she has not been checking her sugars but she denies any hypoglycemic events.  She does have her children at home.  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  She denies any recent illnesses.  She denies any chest pain, shortness of breath, headache.  She does have significant insomnia.  This is been a lifelong issue.  She also has had increased depression symptoms and lack of motivation.  Past Medical History:  Diagnosis Date  . Acute heart failure ([place]   . ADD (attention deficit disorder)   . Bronchitis   . Depression   . Elevated troponin   . Hypertension   . Obesity    Relevant past medical, surgical, family and social history reviewed and updated as indicated. Interim medical history since our last visit reviewed. Allergies and medications reviewed and updated. DATA REVIEWED: CHART IN EPIC  Family History reviewed for pertinent findings.  Review of Systems  Constitutional: Negative.   HENT: Negative.   Eyes: Negative.   Respiratory: Negative.   Gastrointestinal: Negative.   Genitourinary: Negative.   Psychiatric/Behavioral: Positive for decreased concentration and dysphoric mood. The patient is nervous/anxious.     Allergies as of 09/03/2018   No Known Allergies     Medication List       Accurate as of Sep 03, 2018  3:35 PM. If you have any questions, ask your nurse or doctor.        STOP taking these medications   azithromycin 250 MG tablet  Commonly known as:  Zithromax Z-Pak Stopped by:  ATerald Sleeper PA-C   benzonatate 100 MG capsule Commonly known as:  TESSALON Stopped by:  ATerald Sleeper PA-C   HYDROcodone-homatropine 5-1.5 MG/5ML syrup Commonly known as:  HYCODAN Stopped by:  ATerald Sleeper PA-C   predniSONE 10 MG (21) Tbpk tablet Commonly known as:  STERAPRED UNI-PAK 21 TAB Stopped by:  ATerald Sleeper PA-C     TAKE these medications   buPROPion 150 MG 24 hr tablet Commonly known as:  Wellbutrin XL Take 1 tablet (150 mg total) by mouth daily. Started by:  ATerald Sleeper PA-C   carvedilol 6.25 MG tablet Commonly known as:  COREG Take 1 tablet (6.25 mg total) by mouth 2 (two) times daily with a meal.   escitalopram 20 MG tablet Commonly known as:  LEXAPRO Take 1 tablet (20 mg total) by mouth daily.   furosemide 40 MG tablet Commonly known as:  LASIX TAKE ONE (1) TABLET EACH DAY   hydrALAZINE 50 MG tablet Commonly known as:  APRESOLINE TAKE 1/2 TABLET 3 TIMES DAILY   hydrOXYzine 25 MG tablet Commonly known as:  ATARAX/VISTARIL Take 1 tablet (25 mg total) by mouth 3 (three) times daily as needed for anxiety. Started by:  ATerald Sleeper PA-C   losartan 100 MG tablet Commonly known  as:  COZAAR TAKE ONE (1) TABLET EACH DAY   metFORMIN 500 MG tablet Commonly known as:  GLUCOPHAGE Take 1 tablet (500 mg total) by mouth daily with breakfast.   spironolactone 25 MG tablet Commonly known as:  ALDACTONE Take 1 tablet (25 mg total) by mouth daily.          Objective:    BP (!) 156/96   Pulse 80   Temp 98 F (36.7 C) (Oral)   Ht 5' 3"  (1.6 m)   Wt 273 lb (123.8 kg)   BMI 48.36 kg/m   No Known Allergies  Wt Readings from Last 3 Encounters:  09/03/18 273 lb (123.8 kg)  06/04/18 264 lb (119.7 kg)  04/30/18 265 lb (120.2 kg)    Physical Exam Constitutional:      Appearance: She is well-developed.  HENT:     Head: Normocephalic and atraumatic.  Eyes:     Conjunctiva/sclera:  Conjunctivae normal.     Pupils: Pupils are equal, round, and reactive to light.  Cardiovascular:     Rate and Rhythm: Normal rate and regular rhythm.     Heart sounds: Normal heart sounds.  Pulmonary:     Effort: Pulmonary effort is normal.     Breath sounds: Normal breath sounds.  Abdominal:     General: Bowel sounds are normal.     Palpations: Abdomen is soft.  Skin:    General: Skin is warm and dry.     Findings: No rash.  Neurological:     Mental Status: She is alert and oriented to person, place, and time.     Deep Tendon Reflexes: Reflexes are normal and symmetric.  Psychiatric:        Behavior: Behavior normal.        Thought Content: Thought content normal.        Judgment: Judgment normal.     Results for orders placed or performed in visit on 06/04/18  CBC with Differential/Platelet  Result Value Ref Range   WBC 3.9 3.4 - 10.8 x10E3/uL   RBC 5.17 3.77 - 5.28 x10E6/uL   Hemoglobin 16.2 (H) 11.1 - 15.9 g/dL   Hematocrit 46.1 34.0 - 46.6 %   MCV 89 79 - 97 fL   MCH 31.3 26.6 - 33.0 pg   MCHC 35.1 31.5 - 35.7 g/dL   RDW 13.2 11.7 - 15.4 %   Platelets 171 150 - 450 x10E3/uL   Neutrophils 63 Not Estab. %   Lymphs 24 Not Estab. %   Monocytes 8 Not Estab. %   Eos 4 Not Estab. %   Basos 1 Not Estab. %   Neutrophils Absolute 2.4 1.4 - 7.0 x10E3/uL   Lymphocytes Absolute 1.0 0.7 - 3.1 x10E3/uL   Monocytes Absolute 0.3 0.1 - 0.9 x10E3/uL   EOS (ABSOLUTE) 0.2 0.0 - 0.4 x10E3/uL   Basophils Absolute 0.0 0.0 - 0.2 x10E3/uL   Immature Granulocytes 0 Not Estab. %   Immature Grans (Abs) 0.0 0.0 - 0.1 x10E3/uL  CMP14+EGFR  Result Value Ref Range   Glucose 232 (H) 65 - 99 mg/dL   BUN 16 6 - 24 mg/dL   Creatinine, Ser 1.01 (H) 0.57 - 1.00 mg/dL   GFR calc non Af Amer 65 >59 mL/min/1.73   GFR calc Af Amer 74 >59 mL/min/1.73   BUN/Creatinine Ratio 16 9 - 23   Sodium 142 134 - 144 mmol/L   Potassium 3.9 3.5 - 5.2 mmol/L   Chloride 97 96 - 106 mmol/L  CO2 22 20 - 29  mmol/L   Calcium 9.2 8.7 - 10.2 mg/dL   Total Protein 7.0 6.0 - 8.5 g/dL   Albumin 4.0 3.8 - 4.9 g/dL   Globulin, Total 3.0 1.5 - 4.5 g/dL   Albumin/Globulin Ratio 1.3 1.2 - 2.2   Bilirubin Total 0.3 0.0 - 1.2 mg/dL   Alkaline Phosphatase 91 39 - 117 IU/L   AST 19 0 - 40 IU/L   ALT 29 0 - 32 IU/L  TSH  Result Value Ref Range   TSH 3.160 0.450 - 4.500 uIU/mL  Bayer DCA Hb A1c Waived  Result Value Ref Range   HB A1C (BAYER DCA - WAIVED) 7.6 (H) <7.0 %  Lipid panel  Result Value Ref Range   Cholesterol, Total 176 100 - 199 mg/dL   Triglycerides 143 0 - 149 mg/dL   HDL 46 >39 mg/dL   VLDL Cholesterol Cal 29 5 - 40 mg/dL   LDL Calculated 101 (H) 0 - 99 mg/dL   Chol/HDL Ratio 3.8 0.0 - 4.4 ratio      Assessment & Plan:   1. Essential hypertension - furosemide (LASIX) 40 MG tablet; TAKE ONE (1) TABLET EACH DAY  Dispense: 90 tablet; Refill: 3 - hydrALAZINE (APRESOLINE) 50 MG tablet; TAKE 1/2 TABLET 3 TIMES DAILY  Dispense: 90 tablet; Refill: 5 - losartan (COZAAR) 100 MG tablet; TAKE ONE (1) TABLET EACH DAY  Dispense: 90 tablet; Refill: 3 - spironolactone (ALDACTONE) 25 MG tablet; Take 1 tablet (25 mg total) by mouth daily.  Dispense: 90 tablet; Refill: 3 - CBC with Differential/Platelet - CMP14+EGFR - Lipid panel  2. Severe episode of recurrent major depressive disorder, without psychotic features ([facility]) - CBC with Differential/Platelet - CMP14+EGFR - Lipid panel - buPROPion (WELLBUTRIN XL) 150 MG 24 hr tablet; Take 1 tablet (150 mg total) by mouth daily.  Dispense: 30 tablet; Refill: 5  3. Hyperglycemia - spironolactone (ALDACTONE) 25 MG tablet; Take 1 tablet (25 mg total) by mouth daily.  Dispense: 90 tablet; Refill: 3 - Bayer DCA Hb A1c Waived  4. Primary insomnia  - hydrOXYzine (ATARAX/VISTARIL) 25 MG tablet; Take 1 tablet (25 mg total) by mouth 3 (three) times daily as needed for anxiety.  Dispense: 90 tablet; Refill: 2 Consider Belsomra if this does not help  Continue  all other maintenance medications as listed above.  Follow up plan: Return in about 3 months (around 12/04/2018).  Educational handout given for [place] PA-C [facility] [address]  [place], [place] [address] [phone]   09/03/2018, 3:35 PM

## 2018-09-04 LAB — LIPID PANEL
Chol/HDL Ratio: 4.3 ratio (ref 0.0–4.4)
Cholesterol, Total: 206 mg/dL — ABNORMAL HIGH (ref 100–199)
HDL: 48 mg/dL (ref >39)
LDL Calculated: 118 mg/dL — ABNORMAL HIGH (ref 0–99)
Triglycerides: 198 mg/dL — ABNORMAL HIGH (ref 0–149)
VLDL Cholesterol Cal: 40 mg/dL (ref 5–40)

## 2018-09-04 LAB — CBC WITH DIFFERENTIAL/PLATELET
Basophils Absolute: 0.1 10*3/uL (ref 0.0–0.2)
Basos: 1 %
EOS (ABSOLUTE): 0.2 10*3/uL (ref 0.0–0.4)
Eos: 3 %
Hematocrit: 42.7 % (ref 34.0–46.6)
Hemoglobin: 15.3 g/dL (ref 11.1–15.9)
Immature Grans (Abs): 0 10*3/uL (ref 0.0–0.1)
Immature Granulocytes: 0 %
Lymphocytes Absolute: 1.5 10*3/uL (ref 0.7–3.1)
Lymphs: 25 %
MCH: 33.3 pg — ABNORMAL HIGH (ref 26.6–33.0)
MCHC: 35.8 g/dL — ABNORMAL HIGH (ref 31.5–35.7)
MCV: 93 fL (ref 79–97)
Monocytes Absolute: 0.4 10*3/uL (ref 0.1–0.9)
Monocytes: 7 %
Neutrophils Absolute: 3.8 10*3/uL (ref 1.4–7.0)
Neutrophils: 64 %
Platelets: 206 10*3/uL (ref 150–450)
RBC: 4.6 x10E6/uL (ref 3.77–5.28)
RDW: 13.4 % (ref 11.7–15.4)
WBC: 6.1 10*3/uL (ref 3.4–10.8)

## 2018-09-04 LAB — CMP14+EGFR
ALT: 32 IU/L (ref 0–32)
AST: 17 IU/L (ref 0–40)
Albumin/Globulin Ratio: 1.5 (ref 1.2–2.2)
Albumin: 4.3 g/dL (ref 3.8–4.9)
Alkaline Phosphatase: 82 IU/L (ref 39–117)
BUN/Creatinine Ratio: 15 (ref 9–23)
BUN: 15 mg/dL (ref 6–24)
Bilirubin Total: 0.4 mg/dL (ref 0.0–1.2)
CO2: 25 mmol/L (ref 20–29)
Calcium: 9.2 mg/dL (ref 8.7–10.2)
Chloride: 100 mmol/L (ref 96–106)
Creatinine, Ser: 0.97 mg/dL (ref 0.57–1.00)
GFR calc Af Amer: 78 mL/min/{1.73_m2} (ref >59)
GFR calc non Af Amer: 68 mL/min/{1.73_m2} (ref >59)
Globulin, Total: 2.9 g/dL (ref 1.5–4.5)
Glucose: 139 mg/dL — ABNORMAL HIGH (ref 65–99)
Potassium: 4 mmol/L (ref 3.5–5.2)
Sodium: 139 mmol/L (ref 134–144)
Total Protein: 7.2 g/dL (ref 6.0–8.5)

## 2018-12-17 ENCOUNTER — Ambulatory Visit (INDEPENDENT_AMBULATORY_CARE_PROVIDER_SITE_OTHER): Payer: 59 | Admitting: Physician Assistant

## 2018-12-17 VITALS — BP 120/70

## 2018-12-17 DIAGNOSIS — F332 Major depressive disorder, recurrent severe without psychotic features: Secondary | ICD-10-CM

## 2018-12-17 DIAGNOSIS — R739 Hyperglycemia, unspecified: Secondary | ICD-10-CM

## 2018-12-17 DIAGNOSIS — Z0000 Encounter for general adult medical examination without abnormal findings: Secondary | ICD-10-CM

## 2018-12-17 DIAGNOSIS — F329 Major depressive disorder, single episode, unspecified: Secondary | ICD-10-CM | POA: Diagnosis not present

## 2018-12-17 DIAGNOSIS — I10 Essential (primary) hypertension: Secondary | ICD-10-CM

## 2018-12-17 DIAGNOSIS — F5101 Primary insomnia: Secondary | ICD-10-CM

## 2018-12-17 DIAGNOSIS — F32A Depression, unspecified: Secondary | ICD-10-CM

## 2018-12-17 MED ORDER — ESCITALOPRAM OXALATE 20 MG PO TABS: 20.0000 mg | ORAL_TABLET | Freq: Every day | ORAL | 5 refills | Status: DC

## 2018-12-17 MED ORDER — BUPROPION HCL ER (XL) 150 MG PO TB24: 150.0000 mg | ORAL_TABLET | Freq: Every day | ORAL | 5 refills | Status: DC

## 2018-12-17 MED ORDER — SPIRONOLACTONE 25 MG PO TABS: 25.0000 mg | ORAL_TABLET | Freq: Every day | ORAL | 3 refills | Status: DC

## 2018-12-17 MED ORDER — FUROSEMIDE 40 MG PO TABS: ORAL_TABLET | ORAL | 3 refills | Status: DC

## 2018-12-17 MED ORDER — ZOLPIDEM TARTRATE ER 12.5 MG PO TBCR: 12.5000 mg | EXTENDED_RELEASE_TABLET | Freq: Every evening | ORAL | 5 refills | Status: DC | PRN

## 2018-12-17 MED ORDER — METFORMIN HCL 500 MG PO TABS: 500.0000 mg | ORAL_TABLET | Freq: Every day | ORAL | 3 refills | Status: DC

## 2018-12-17 MED ORDER — LOSARTAN POTASSIUM 100 MG PO TABS: ORAL_TABLET | ORAL | 3 refills | Status: DC

## 2018-12-17 MED ORDER — CARVEDILOL 6.25 MG PO TABS: 6.2500 mg | ORAL_TABLET | Freq: Two times a day (BID) | ORAL | 5 refills | Status: DC

## 2018-12-22 ENCOUNTER — Encounter: Payer: Self-pay | Admitting: Physician Assistant

## 2018-12-22 DIAGNOSIS — R739 Hyperglycemia, unspecified: Secondary | ICD-10-CM | POA: Insufficient documentation

## 2018-12-22 NOTE — Progress Notes (Signed)
Telephone visit  Subjective: CC: Recheck on chronic medical conditions PCP: Terald Sleeper, PA-C [facility] is a 53 y.o. female calls for telephone consult today. Patient provides verbal consent for consult held via phone.  Patient is identified with 2 separate identifiers.  At this time the entire area is on COVID-19 social distancing and stay home orders are in place.  Patient is of higher risk and therefore we are performing this by a virtual method.  Location of patient: home Location of provider: [facility] Others present for call: No  1.  This patient is having a periodic recheck on her chronic medical conditions which do include hypertension, primary insomnia, depression, insomnia.  The patient reports that recent blood pressures have been 120/70.  On previous check her hemoglobin A1c had come down to 5.8.  We will plan for her to come in for labs again in the near future.  She reports that she is doing very well overall and not having any complaints.  She denies any chest pain, shortness of breath, no nausea vomiting or diarrhea.  We attempted hydroxyzine for sleep and it left her feeling quite bad.  We have put this on her sensitivity list.  At this time multiple things have been tried so we will try Ambien for her to use for sleep.   ROS: Per HPI  Allergies  Allergen Reactions  . Hydroxyzine     Bad dreams   Past Medical History:  Diagnosis Date  . Acute heart failure ([place])   . ADD (attention deficit disorder)   . Bronchitis   . Depression   . Elevated troponin   . Hypertension   . Obesity     Current Outpatient Medications:  .  buPROPion (WELLBUTRIN XL) 150 MG 24 hr tablet, Take 1 tablet (150 mg total) by mouth daily., Disp: 30 tablet, Rfl: 5 .  carvedilol (COREG) 6.25 MG tablet, Take 1 tablet (6.25 mg total) by mouth 2 (two) times daily with a meal., Disp: 60 tablet, Rfl: 5 .  escitalopram (LEXAPRO) 20 MG tablet, Take 1 tablet (20 mg total) by mouth daily.,  Disp: 30 tablet, Rfl: 5 .  furosemide (LASIX) 40 MG tablet, TAKE ONE (1) TABLET EACH DAY, Disp: 90 tablet, Rfl: 3 .  hydrALAZINE (APRESOLINE) 50 MG tablet, TAKE 1/2 TABLET 3 TIMES DAILY, Disp: 90 tablet, Rfl: 5 .  losartan (COZAAR) 100 MG tablet, TAKE ONE (1) TABLET EACH DAY, Disp: 90 tablet, Rfl: 3 .  metFORMIN (GLUCOPHAGE) 500 MG tablet, Take 1 tablet (500 mg total) by mouth daily with breakfast., Disp: 90 tablet, Rfl: 3 .  spironolactone (ALDACTONE) 25 MG tablet, Take 1 tablet (25 mg total) by mouth daily., Disp: 90 tablet, Rfl: 3 .  zolpidem (AMBIEN CR) 12.5 MG CR tablet, Take 1 tablet (12.5 mg total) by mouth at bedtime as needed for sleep., Disp: 30 tablet, Rfl: 5  Assessment/ Plan: 54 y.o. female   1. Essential hypertension - carvedilol (COREG) 6.25 MG tablet; Take 1 tablet (6.25 mg total) by mouth 2 (two) times daily with a meal.  Dispense: 60 tablet; Refill: 5 - furosemide (LASIX) 40 MG tablet; TAKE ONE (1) TABLET EACH DAY  Dispense: 90 tablet; Refill: 3 - losartan (COZAAR) 100 MG tablet; TAKE ONE (1) TABLET EACH DAY  Dispense: 90 tablet; Refill: 3 - spironolactone (ALDACTONE) 25 MG tablet; Take 1 tablet (25 mg total) by mouth daily.  Dispense: 90 tablet; Refill: 3 - CBC with Differential/Platelet; Future - CMP14+EGFR; Future -  Lipid panel; Future - TSH; Future - Bayer DCA Hb A1c Waived; Future  2. Depression, unspecified depression type - escitalopram (LEXAPRO) 20 MG tablet; Take 1 tablet (20 mg total) by mouth daily.  Dispense: 30 tablet; Refill: 5  3. Severe episode of recurrent major depressive disorder, without psychotic features ([place]) - buPROPion (WELLBUTRIN XL) 150 MG 24 hr tablet; Take 1 tablet (150 mg total) by mouth daily.  Dispense: 30 tablet; Refill: 5  4. Primary insomnia - zolpidem (AMBIEN CR) 12.5 MG CR tablet; Take 1 tablet (12.5 mg total) by mouth at bedtime as needed for sleep.  Dispense: 30 tablet; Refill: 5  5. Hyperglycemia - metFORMIN (GLUCOPHAGE) 500 MG  tablet; Take 1 tablet (500 mg total) by mouth daily with breakfast.  Dispense: 90 tablet; Refill: 3 - spironolactone (ALDACTONE) 25 MG tablet; Take 1 tablet (25 mg total) by mouth daily.  Dispense: 90 tablet; Refill: 3 - CBC with Differential/Platelet; Future - CMP14+EGFR; Future - Lipid panel; Future - TSH; Future - Bayer DCA Hb A1c Waived; Future  6. Well adult exam - CBC with Differential/Platelet; Future - CMP14+EGFR; Future - Lipid panel; Future - TSH; Future - Bayer DCA Hb A1c Waived; Future   No follow-ups on file.  Continue all other maintenance medications as listed above.  Start time: 2:23 PM End time: 2:36 PM  Meds ordered this encounter  Medications  . carvedilol (COREG) 6.25 MG tablet    Sig: Take 1 tablet (6.25 mg total) by mouth 2 (two) times daily with a meal.    Dispense:  60 tablet    Refill:  5    Order Specific Question:   Supervising Provider    Answer:   Janora Norlander [[id number]]  . escitalopram (LEXAPRO) 20 MG tablet    Sig: Take 1 tablet (20 mg total) by mouth daily.    Dispense:  30 tablet    Refill:  5    Order Specific Question:   Supervising Provider    Answer:   Janora Norlander [[id number]]  . furosemide (LASIX) 40 MG tablet    Sig: TAKE ONE (1) TABLET EACH DAY    Dispense:  90 tablet    Refill:  3    Order Specific Question:   Supervising Provider    Answer:   Janora Norlander [[id number]]  . buPROPion (WELLBUTRIN XL) 150 MG 24 hr tablet    Sig: Take 1 tablet (150 mg total) by mouth daily.    Dispense:  30 tablet    Refill:  5    Order Specific Question:   Supervising Provider    Answer:   Janora Norlander [[id number]]  . losartan (COZAAR) 100 MG tablet    Sig: TAKE ONE (1) TABLET EACH DAY    Dispense:  90 tablet    Refill:  3    Order Specific Question:   Supervising Provider    Answer:   Janora Norlander [[id number]]  . metFORMIN (GLUCOPHAGE) 500 MG tablet    Sig: Take 1 tablet (500 mg total) by mouth daily with breakfast.     Dispense:  90 tablet    Refill:  3    Order Specific Question:   Supervising Provider    Answer:   Janora Norlander [[id number]]  . spironolactone (ALDACTONE) 25 MG tablet    Sig: Take 1 tablet (25 mg total) by mouth daily.    Dispense:  90 tablet    Refill:  3    Order Specific Question:  Supervising Provider    Answer:   Janora Norlander [[id number]]  . zolpidem (AMBIEN CR) 12.5 MG CR tablet    Sig: Take 1 tablet (12.5 mg total) by mouth at bedtime as needed for sleep.    Dispense:  30 tablet    Refill:  5    Order Specific Question:   Supervising Provider    Answer:   Janora Norlander [[id number]]    Particia Nearing PA-C [place] [phone]

## 2019-05-01 ENCOUNTER — Telehealth: Payer: Self-pay | Admitting: Physician Assistant

## 2019-05-01 ENCOUNTER — Other Ambulatory Visit: Payer: Self-pay | Admitting: Physician Assistant

## 2019-05-01 MED ORDER — ZOLPIDEM TARTRATE 10 MG PO TABS: 10.0000 mg | ORAL_TABLET | Freq: Every evening | ORAL | 2 refills | Status: DC | PRN

## 2019-05-01 NOTE — Telephone Encounter (Signed)
sent 

## 2019-05-01 NOTE — Telephone Encounter (Signed)
Patient called stating that she was trying to pick up her Rx for Ambien, 12.5 mg.. Was told by pharmacist that it would be $79 and advised patient to contact Lawanna Kobus to see if something could be done to lower the price. Pt says in the past she would take 10mg  of Ambien and her insurance would pay for it. Patient is ok with taking 10mg .

## 2019-05-08 NOTE — Telephone Encounter (Signed)
Multiple attempts made to contact patient.  This encounter will now be closed  

## 2019-07-02 ENCOUNTER — Other Ambulatory Visit: Payer: Self-pay | Admitting: Physician Assistant

## 2019-08-04 ENCOUNTER — Other Ambulatory Visit: Payer: Self-pay | Admitting: *Deleted

## 2020-03-08 ENCOUNTER — Other Ambulatory Visit: Payer: Self-pay

## 2020-03-08 ENCOUNTER — Emergency Department (HOSPITAL_COMMUNITY): Payer: 59

## 2020-03-08 ENCOUNTER — Encounter (HOSPITAL_COMMUNITY): Payer: Self-pay | Admitting: Emergency Medicine

## 2020-03-08 ENCOUNTER — Emergency Department (HOSPITAL_COMMUNITY)
Admission: EM | Admit: 2020-03-08 | Discharge: 2020-03-08 | Disposition: A | Discharge status: Home / Self Care | Payer: 59 | Source: Home / Self Care | Attending: Emergency Medicine | Admitting: Emergency Medicine

## 2020-03-08 DIAGNOSIS — R4182 Altered mental status, unspecified: Secondary | ICD-10-CM | POA: Insufficient documentation

## 2020-03-08 DIAGNOSIS — I5023 Acute on chronic systolic (congestive) heart failure: Secondary | ICD-10-CM | POA: Insufficient documentation

## 2020-03-08 DIAGNOSIS — Z7984 Long term (current) use of oral hypoglycemic drugs: Secondary | ICD-10-CM | POA: Insufficient documentation

## 2020-03-08 DIAGNOSIS — I110 Hypertensive heart disease with heart failure: Secondary | ICD-10-CM | POA: Diagnosis not present

## 2020-03-08 DIAGNOSIS — R609 Edema, unspecified: Secondary | ICD-10-CM | POA: Insufficient documentation

## 2020-03-08 DIAGNOSIS — Z79899 Other long term (current) drug therapy: Secondary | ICD-10-CM | POA: Insufficient documentation

## 2020-03-08 LAB — BLOOD GAS, VENOUS
Acid-Base Excess: 4.8 mmol/L — ABNORMAL HIGH (ref 0.0–2.0)
Bicarbonate: 26.9 mmol/L (ref 20.0–28.0)
FIO2: 21
O2 Saturation: 42.8 %
Patient temperature: 37
pCO2, Ven: 48 mmHg (ref 44.0–60.0)
pH, Ven: 7.403 (ref 7.250–7.430)
pO2, Ven: <31 mmHg — CL (ref 32.0–45.0)

## 2020-03-08 LAB — COMPREHENSIVE METABOLIC PANEL
ALT: 11 U/L (ref 0–44)
AST: 16 U/L (ref 15–41)
Albumin: 3.2 g/dL — ABNORMAL LOW (ref 3.5–5.0)
Alkaline Phosphatase: 69 U/L (ref 38–126)
Anion gap: 10 (ref 5–15)
BUN: 10 mg/dL (ref 6–20)
CO2: 26 mmol/L (ref 22–32)
Calcium: 9.1 mg/dL (ref 8.9–10.3)
Chloride: 102 mmol/L (ref 98–111)
Creatinine, Ser: 1.04 mg/dL — ABNORMAL HIGH (ref 0.44–1.00)
GFR, Estimated: >60 mL/min (ref >60)
Glucose, Bld: 214 mg/dL — ABNORMAL HIGH (ref 70–99)
Potassium: 3.3 mmol/L — ABNORMAL LOW (ref 3.5–5.1)
Sodium: 138 mmol/L (ref 135–145)
Total Bilirubin: 1.5 mg/dL — ABNORMAL HIGH (ref 0.3–1.2)
Total Protein: 6.6 g/dL (ref 6.5–8.1)

## 2020-03-08 LAB — CBC WITH DIFFERENTIAL/PLATELET
Abs Immature Granulocytes: 0.09 10*3/uL — ABNORMAL HIGH (ref 0.00–0.07)
Basophils Absolute: 0.1 10*3/uL (ref 0.0–0.1)
Basophils Relative: 1 %
Eosinophils Absolute: 0.1 10*3/uL (ref 0.0–0.5)
Eosinophils Relative: 1 %
HCT: 41.6 % (ref 36.0–46.0)
Hemoglobin: 12.8 g/dL (ref 12.0–15.0)
Immature Granulocytes: 1 %
Lymphocytes Relative: 11 %
Lymphs Abs: 0.9 10*3/uL (ref 0.7–4.0)
MCH: 31.2 pg (ref 26.0–34.0)
MCHC: 30.8 g/dL (ref 30.0–36.0)
MCV: 101.5 fL — ABNORMAL HIGH (ref 80.0–100.0)
Monocytes Absolute: 0.4 10*3/uL (ref 0.1–1.0)
Monocytes Relative: 5 %
Neutro Abs: 7.1 10*3/uL (ref 1.7–7.7)
Neutrophils Relative %: 81 %
Platelets: 236 10*3/uL (ref 150–400)
RBC: 4.1 MIL/uL (ref 3.87–5.11)
RDW: 15.5 % (ref 11.5–15.5)
WBC: 8.7 10*3/uL (ref 4.0–10.5)
nRBC: 0 % (ref 0.0–0.2)

## 2020-03-08 LAB — AMMONIA: Ammonia: 29 umol/L (ref 9–35)

## 2020-03-08 LAB — PROTIME-INR
INR: 1.1 (ref 0.8–1.2)
Prothrombin Time: 14.1 seconds (ref 11.4–15.2)

## 2020-03-08 LAB — BRAIN NATRIURETIC PEPTIDE: B Natriuretic Peptide: 699 pg/mL — ABNORMAL HIGH (ref 0.0–100.0)

## 2020-03-08 IMAGING — DX DG CHEST 1V PORT
1 series · 1 of 1 positions shown · non-contrast
Comparison: Portable exam [YD] hours compared to [DATE]

CLINICAL DATA: Shortness of breath for several weeks, chronic edema

EXAM:
PORTABLE CHEST 1 VIEW

[chest ap]
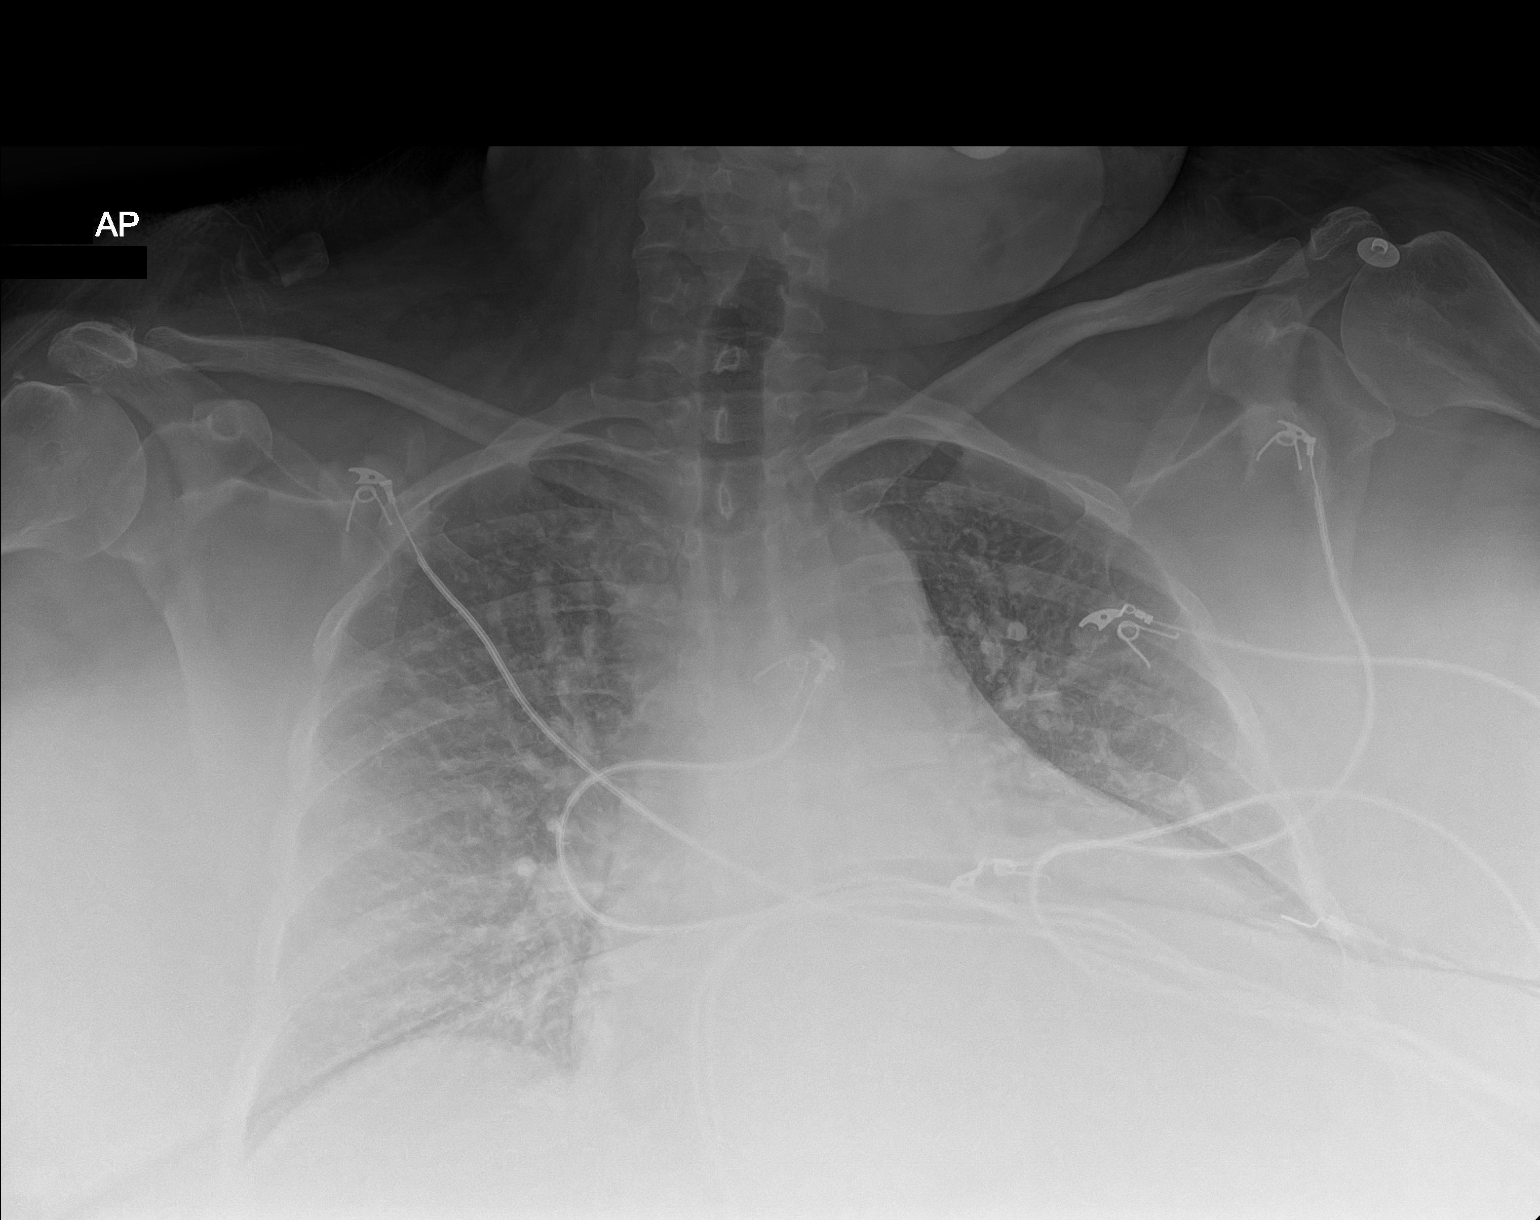

[1 of 1 positions shown; findings below may reference images not displayed]

FINDINGS: Enlargement of cardiac silhouette with pulmonary vascular
congestion.

Interstitial infiltrate likely pulmonary edema and CHF.

No gross pleural effusion or pneumothorax.

Osseous structures unremarkable.
IMPRESSION: Enlargement of cardiac silhouette with pulmonary vascular congestion
and probable mild pulmonary edema.

## 2020-03-08 MED ORDER — FUROSEMIDE 10 MG/ML IJ SOLN
40.0000 mg | Freq: Once | INTRAMUSCULAR | Status: AC
  Administered 2020-03-08: 40 mg via INTRAVENOUS
  Filled 2020-03-08: qty 4

## 2020-03-08 MED ORDER — FUROSEMIDE 40 MG PO TABS: 80.0000 mg | ORAL_TABLET | Freq: Every day | ORAL | 0 refills | Status: DC

## 2020-03-08 NOTE — Discharge Instructions (Signed)
I suggested that you come into the hospital to get more of this fluid taken off.  Please follow-up with your doctor that you have scheduled tomorrow and discuss with your visit here today.  I am going to increase your Lasix to 80 mg every day for the next week.  This can really mess with your potassium and your kidney function.  Typically we want your lab work rechecked within a week.  Please return for worsening difficulty breathing or if you would like to come into the hospital.

## 2020-03-08 NOTE — ED Triage Notes (Signed)
Pt arrived [facility]. Pt brought for AMS. Family states it started several weeks ago. Edema noted, chronic per EMS.

## 2020-03-08 NOTE — ED Provider Notes (Signed)
[facility] EMERGENCY DEPARTMENT Provider Note   CSN: [id number] Arrival date & time: 03/08/20  1655     History Chief Complaint  Patient presents with  . Altered Mental Status    Connie CapronDenise Marton is a 54 y.o. female.  54 yo F per the EMS report is here for altered mental status of the patient states she is here for difficulty breathing.  She says is been going on for about 3 weeks.  Having some worsening edema.  She thinks that she has had swelling for about that same amount of time.  She denies history of issue with her heart kidneys or liver.  She denies cough congestion or fever denies abdominal pain nausea vomiting or diarrhea.  She doesn't think that she has any fluid pills at home.  Denies any chest pain or pressure.  The history is provided by the patient.  Altered Mental Status Associated symptoms: no fever, no headaches, no nausea, no palpitations and no vomiting   Illness Severity:  Moderate Onset quality:  Gradual Duration:  3 weeks Timing:  Constant Progression:  Worsening Chronicity:  New Associated symptoms: shortness of breath   Associated symptoms: no chest pain, no congestion, no cough, no fever, no headaches, no myalgias, no nausea, no rhinorrhea, no vomiting and no wheezing        Past Medical History:  Diagnosis Date  . Acute heart failure (HCC)   . ADD (attention deficit disorder)   . Bronchitis   . Depression   . Elevated troponin   . Hypertension   . Obesity     Patient Active Problem List   Diagnosis Date Noted  . Hyperglycemia 12/22/2018  . Hypertensive crisis 04/04/2018  . Acute CHF (congestive heart failure) (HCC) 04/04/2018  . Mild renal insufficiency 04/04/2018  . Elevated troponin 04/04/2018  . Bronchitis   . Obesity   . Seborrheic dermatitis of scalp 05/23/2016  . Hypertension 12/02/2015  . Depression 12/02/2015  . Primary insomnia 12/02/2015  . Body mass index 45.0-49.9, adult (HCC) 12/02/2015  . Attention deficit hyperactivity  disorder (ADHD), predominantly inattentive type 12/02/2015    History reviewed. No pertinent surgical history.   OB History   No obstetric history on file.     Family History  Problem Relation Age of Onset  . Kidney Stones Mother   . Hypertension Mother   . Heart disease Father     Social History   Tobacco Use  . Smoking status: Never Smoker  . Smokeless tobacco: Never Used  Vaping Use  . Vaping Use: Never used  Substance Use Topics  . Alcohol use: No  . Drug use: No    Home Medications Prior to Admission medications   Medication Sig Start Date End Date Taking? Authorizing Provider  buPROPion (WELLBUTRIN XL) 150 MG 24 hr tablet Take 1 tablet (150 mg total) by mouth daily. 12/17/18   Remus LofflerJones, Angel S, PA-C  carvedilol (COREG) 6.25 MG tablet Take 1 tablet (6.25 mg total) by mouth 2 (two) times daily with a meal. 12/17/18   Remus LofflerJones, Angel S, PA-C  escitalopram (LEXAPRO) 20 MG tablet Take 1 tablet (20 mg total) by mouth daily. 12/17/18   Remus LofflerJones, Angel S, PA-C  furosemide (LASIX) 40 MG tablet TAKE ONE (1) TABLET EACH DAY 12/17/18   Remus LofflerJones, Angel S, PA-C  furosemide (LASIX) 40 MG tablet Take 2 tablets (80 mg total) by mouth daily for 7 days. 03/08/20 03/15/20  Melene PlanFloyd, Edithe Dobbin, DO  hydrALAZINE (APRESOLINE) 50 MG tablet TAKE 1/2 TABLET  3 TIMES DAILY 09/03/18   Remus Loffler, PA-C  losartan (COZAAR) 100 MG tablet TAKE ONE (1) TABLET EACH DAY 12/17/18   Remus Loffler, PA-C  metFORMIN (GLUCOPHAGE) 500 MG tablet Take 1 tablet (500 mg total) by mouth daily with breakfast. 12/17/18   Remus Loffler, PA-C  spironolactone (ALDACTONE) 25 MG tablet Take 1 tablet (25 mg total) by mouth daily. 12/17/18   Remus Loffler, PA-C  zolpidem (AMBIEN) 10 MG tablet Take 1 tablet (10 mg total) by mouth at bedtime as needed for sleep. 05/01/19 05/31/19  Remus Loffler, PA-C    Allergies    Hydroxyzine  Review of Systems   Review of Systems  Constitutional: Negative for chills and fever.  HENT: Negative for congestion and  rhinorrhea.   Eyes: Negative for redness and visual disturbance.  Respiratory: Positive for shortness of breath. Negative for cough and wheezing.   Cardiovascular: Positive for leg swelling. Negative for chest pain and palpitations.  Gastrointestinal: Negative for nausea and vomiting.  Genitourinary: Negative for dysuria and urgency.  Musculoskeletal: Negative for arthralgias and myalgias.  Skin: Negative for pallor and wound.  Neurological: Negative for dizziness and headaches.    Physical Exam Updated Vital Signs BP (!) 196/120   Pulse (!) 104   Temp 98 F (36.7 C) (Oral)   Resp (!) 24   Ht 5\' 4"  (1.626 m)   SpO2 94%   BMI 46.86 kg/m   Physical Exam Vitals and nursing note reviewed.  Constitutional:      General: She is not in acute distress.    Appearance: She is well-developed. She is not diaphoretic.  HENT:     Head: Normocephalic and atraumatic.  Eyes:     Pupils: Pupils are equal, round, and reactive to light.  Cardiovascular:     Rate and Rhythm: Normal rate and regular rhythm.     Heart sounds: No murmur heard.  No friction rub. No gallop.   Pulmonary:     Effort: Pulmonary effort is normal.     Breath sounds: No wheezing or rales.  Abdominal:     General: There is no distension.     Palpations: Abdomen is soft.     Tenderness: There is no abdominal tenderness.     Comments: edema  Musculoskeletal:        General: No tenderness.     Cervical back: Normal range of motion and neck supple.     Right lower leg: Edema present.     Left lower leg: Edema present.     Comments: anasarca  Skin:    General: Skin is warm and dry.  Neurological:     Mental Status: She is alert and oriented to person, place, and time.  Psychiatric:        Behavior: Behavior normal.     ED Results / Procedures / Treatments   Labs (all labs ordered are listed, but only abnormal results are displayed) Labs Reviewed  CBC WITH DIFFERENTIAL/PLATELET - Abnormal; Notable for the  following components:      Result Value   MCV 101.5 (*)    Abs Immature Granulocytes 0.09 (*)    All other components within normal limits  COMPREHENSIVE METABOLIC PANEL - Abnormal; Notable for the following components:   Potassium 3.3 (*)    Glucose, Bld 214 (*)    Creatinine, Ser 1.04 (*)    Albumin 3.2 (*)    Total Bilirubin 1.5 (*)    All other components within  normal limits  BRAIN NATRIURETIC PEPTIDE - Abnormal; Notable for the following components:   B Natriuretic Peptide 699.0 (*)    All other components within normal limits  BLOOD GAS, VENOUS - Abnormal; Notable for the following components:   pO2, Ven <31.0 (*)    Acid-Base Excess 4.8 (*)    All other components within normal limits  RESP PANEL BY RT-PCR (FLU A&B, COVID) ARPGX2  PROTIME-INR  AMMONIA    EKG EKG Interpretation  Date/Time:  Tuesday March 08 2020 17:10:04 EST Ventricular Rate:  105 PR Interval:    QRS Duration: 87 QT Interval:  392 QTC Calculation: 519 R Axis:   58 Text Interpretation: Sinus tachycardia Anterior infarct, old Prolonged QT interval No significant change since last tracing Confirmed by Melene Plan [phone]) on 03/08/2020 5:13:55 PM   Radiology DG Chest Port 1 View  Result Date: 03/08/2020 CLINICAL DATA:  Shortness of breath for several weeks, chronic edema EXAM: PORTABLE CHEST 1 VIEW COMPARISON:  Portable exam 1756 hours compared to 04/04/2018 FINDINGS: Enlargement of cardiac silhouette with pulmonary vascular congestion. Interstitial infiltrate likely pulmonary edema and CHF. No gross pleural effusion or pneumothorax. Osseous structures unremarkable. IMPRESSION: Enlargement of cardiac silhouette with pulmonary vascular congestion and probable mild pulmonary edema. Electronically Signed   By: Ulyses Southward M.D.   On: 03/08/2020 18:26    Procedures Procedures (including critical care time)  Medications Ordered in ED Medications  furosemide (LASIX) injection 40 mg (40 mg Intravenous  Given 03/08/20 1914)    ED Course  I have reviewed the triage vital signs and the nursing notes.  Pertinent labs & imaging results that were available during my care of the patient were reviewed by me and considered in my medical decision making (see chart for details).    MDM Rules/Calculators/A&P                          54 yo F with a chief complaints of shortness of breath and edema. Going on for the past three weeks.  Was actually triaged here by EMS as altered mental status.  Not obviously confused for me.  She denied history of heart failure though looks like she had an admission a couple years ago for heart failure.  Will obtain a laboratory evaluation chest x-ray reassess.  Work-up consistent with fluid overload likely secondary to heart failure.  No LFT elevation INR is normal renal function is at baseline.  No significant electrolyte abnormality.  Chest x-ray with pulmonary edema as viewed by me.  BNP elevated though below her last check.  I discussed the results with the patient and the family.  Family is concerned that maybe she had a stroke, reportedly she has been having some difficulty with her speech though does not have any difficulty currently.  No one-sided weakness.  I discussed with the patient about coming into the [facility] for continued fluid removal however the patient is declining and would like to follow-up as an outpatient.  Has an appointment with one of her doctors tomorrow.  7:18 PM:  I have discussed the diagnosis/risks/treatment options with the patient and family and believe the pt to be eligible for discharge home to follow-up with PCP, Cards. We also discussed returning to the ED immediately if new or worsening sx occur. We discussed the sx which are most concerning (e.g., sudden worsening pain, fever, inability to tolerate by mouth) that necessitate immediate return. Medications administered to the patient during their  visit and any new prescriptions provided to  the patient are listed below.  Medications given during this visit Medications  furosemide (LASIX) injection 40 mg (40 mg Intravenous Given 03/08/20 1914)     The patient appears reasonably screen and/or stabilized for discharge and I doubt any other medical condition or other [facility] requiring further screening, evaluation, or treatment in the ED at this time prior to discharge.   Final Clinical Impression(s) / ED Diagnoses Final diagnoses:  Acute on chronic systolic congestive heart failure (HCC)    Rx / DC Orders ED Discharge Orders         Ordered    furosemide (LASIX) 40 MG tablet  Daily        03/08/20 1915           Melene Plan, DO 03/08/20 1918

## 2020-03-08 NOTE — ED Notes (Signed)
Date and time results received: 03/08/20 6:50 PM  (use smartphrase ".now" to insert current time)  Test: PO2 Critical Value: less than 31  Name of Provider Notified: Dr. Adela Lank  Orders Received? Or Actions Taken?: N/a

## 2020-03-09 ENCOUNTER — Encounter (HOSPITAL_COMMUNITY): Payer: Self-pay

## 2020-03-09 ENCOUNTER — Emergency Department (HOSPITAL_COMMUNITY): Payer: 59

## 2020-03-09 ENCOUNTER — Inpatient Hospital Stay (HOSPITAL_COMMUNITY)
Admission: EM | Admit: 2020-03-09 | Discharge: 2020-03-14 | DRG: 291 | Disposition: A | Discharge status: Home / Self Care | Payer: 59 | Attending: Internal Medicine | Admitting: Internal Medicine

## 2020-03-09 ENCOUNTER — Inpatient Hospital Stay (HOSPITAL_COMMUNITY): Payer: 59

## 2020-03-09 ENCOUNTER — Other Ambulatory Visit: Payer: Self-pay

## 2020-03-09 DIAGNOSIS — Z6841 Body Mass Index (BMI) 40.0 and over, adult: Secondary | ICD-10-CM

## 2020-03-09 DIAGNOSIS — Z9114 Patient's other noncompliance with medication regimen: Secondary | ICD-10-CM

## 2020-03-09 DIAGNOSIS — F418 Other specified anxiety disorders: Secondary | ICD-10-CM | POA: Diagnosis not present

## 2020-03-09 DIAGNOSIS — F419 Anxiety disorder, unspecified: Secondary | ICD-10-CM | POA: Diagnosis present

## 2020-03-09 DIAGNOSIS — F32A Depression, unspecified: Secondary | ICD-10-CM | POA: Diagnosis present

## 2020-03-09 DIAGNOSIS — Z20822 Contact with and (suspected) exposure to covid-19: Secondary | ICD-10-CM | POA: Diagnosis present

## 2020-03-09 DIAGNOSIS — Z888 Allergy status to other drugs, medicaments and biological substances status: Secondary | ICD-10-CM | POA: Diagnosis not present

## 2020-03-09 DIAGNOSIS — R5381 Other malaise: Secondary | ICD-10-CM | POA: Diagnosis not present

## 2020-03-09 DIAGNOSIS — I169 Hypertensive crisis, unspecified: Secondary | ICD-10-CM | POA: Diagnosis present

## 2020-03-09 DIAGNOSIS — Z8249 Family history of ischemic heart disease and other diseases of the circulatory system: Secondary | ICD-10-CM

## 2020-03-09 DIAGNOSIS — F900 Attention-deficit hyperactivity disorder, predominantly inattentive type: Secondary | ICD-10-CM | POA: Diagnosis present

## 2020-03-09 DIAGNOSIS — R601 Generalized edema: Secondary | ICD-10-CM | POA: Diagnosis not present

## 2020-03-09 DIAGNOSIS — E039 Hypothyroidism, unspecified: Secondary | ICD-10-CM | POA: Diagnosis present

## 2020-03-09 DIAGNOSIS — I110 Hypertensive heart disease with heart failure: Secondary | ICD-10-CM | POA: Diagnosis present

## 2020-03-09 DIAGNOSIS — F988 Other specified behavioral and emotional disorders with onset usually occurring in childhood and adolescence: Secondary | ICD-10-CM | POA: Diagnosis present

## 2020-03-09 DIAGNOSIS — E559 Vitamin D deficiency, unspecified: Secondary | ICD-10-CM

## 2020-03-09 DIAGNOSIS — E119 Type 2 diabetes mellitus without complications: Secondary | ICD-10-CM

## 2020-03-09 DIAGNOSIS — Z79899 Other long term (current) drug therapy: Secondary | ICD-10-CM

## 2020-03-09 DIAGNOSIS — I5021 Acute systolic (congestive) heart failure: Secondary | ICD-10-CM | POA: Diagnosis not present

## 2020-03-09 DIAGNOSIS — I5023 Acute on chronic systolic (congestive) heart failure: Secondary | ICD-10-CM | POA: Diagnosis present

## 2020-03-09 DIAGNOSIS — Z7984 Long term (current) use of oral hypoglycemic drugs: Secondary | ICD-10-CM | POA: Diagnosis not present

## 2020-03-09 DIAGNOSIS — I10 Essential (primary) hypertension: Secondary | ICD-10-CM | POA: Diagnosis not present

## 2020-03-09 DIAGNOSIS — R4182 Altered mental status, unspecified: Secondary | ICD-10-CM | POA: Diagnosis present

## 2020-03-09 DIAGNOSIS — E1169 Type 2 diabetes mellitus with other specified complication: Secondary | ICD-10-CM | POA: Diagnosis not present

## 2020-03-09 DIAGNOSIS — Z79890 Hormone replacement therapy (postmenopausal): Secondary | ICD-10-CM

## 2020-03-09 DIAGNOSIS — E876 Hypokalemia: Secondary | ICD-10-CM | POA: Diagnosis present

## 2020-03-09 LAB — CBC WITH DIFFERENTIAL/PLATELET
Abs Immature Granulocytes: 0.03 10*3/uL (ref 0.00–0.07)
Basophils Absolute: 0.1 10*3/uL (ref 0.0–0.1)
Basophils Relative: 1 %
Eosinophils Absolute: 0.2 10*3/uL (ref 0.0–0.5)
Eosinophils Relative: 2 %
HCT: 41.7 % (ref 36.0–46.0)
Hemoglobin: 13 g/dL (ref 12.0–15.0)
Immature Granulocytes: 0 %
Lymphocytes Relative: 15 %
Lymphs Abs: 1.2 10*3/uL (ref 0.7–4.0)
MCH: 31.8 pg (ref 26.0–34.0)
MCHC: 31.2 g/dL (ref 30.0–36.0)
MCV: 102 fL — ABNORMAL HIGH (ref 80.0–100.0)
Monocytes Absolute: 0.6 10*3/uL (ref 0.1–1.0)
Monocytes Relative: 8 %
Neutro Abs: 5.7 10*3/uL (ref 1.7–7.7)
Neutrophils Relative %: 74 %
Platelets: 228 10*3/uL (ref 150–400)
RBC: 4.09 MIL/uL (ref 3.87–5.11)
RDW: 15.9 % — ABNORMAL HIGH (ref 11.5–15.5)
WBC: 7.9 10*3/uL (ref 4.0–10.5)
nRBC: 0 % (ref 0.0–0.2)

## 2020-03-09 LAB — COMPREHENSIVE METABOLIC PANEL
ALT: 9 U/L (ref 0–44)
AST: 15 U/L (ref 15–41)
Albumin: 3.4 g/dL — ABNORMAL LOW (ref 3.5–5.0)
Alkaline Phosphatase: 67 U/L (ref 38–126)
Anion gap: 12 (ref 5–15)
BUN: 10 mg/dL (ref 6–20)
CO2: 26 mmol/L (ref 22–32)
Calcium: 9.2 mg/dL (ref 8.9–10.3)
Chloride: 103 mmol/L (ref 98–111)
Creatinine, Ser: 1 mg/dL (ref 0.44–1.00)
GFR, Estimated: >60 mL/min (ref >60)
Glucose, Bld: 186 mg/dL — ABNORMAL HIGH (ref 70–99)
Potassium: 3.2 mmol/L — ABNORMAL LOW (ref 3.5–5.1)
Sodium: 141 mmol/L (ref 135–145)
Total Bilirubin: 1.2 mg/dL (ref 0.3–1.2)
Total Protein: 6.9 g/dL (ref 6.5–8.1)

## 2020-03-09 LAB — BLOOD GAS, ARTERIAL
Acid-Base Excess: 6.8 mmol/L — ABNORMAL HIGH (ref 0.0–2.0)
Bicarbonate: 30.4 mmol/L — ABNORMAL HIGH (ref 20.0–28.0)
FIO2: 21
O2 Saturation: 96.2 %
Patient temperature: 36.8
pCO2 arterial: 42 mmHg (ref 32.0–48.0)
pH, Arterial: 7.475 — ABNORMAL HIGH (ref 7.350–7.450)
pO2, Arterial: 82.8 mmHg — ABNORMAL LOW (ref 83.0–108.0)

## 2020-03-09 LAB — HEMOGLOBIN A1C
Hgb A1c MFr Bld: 8.2 % — ABNORMAL HIGH (ref 4.8–5.6)
Mean Plasma Glucose: 188.64 mg/dL

## 2020-03-09 LAB — RESP PANEL BY RT-PCR (RSV, FLU A&B, COVID)  RVPGX2
Influenza A by PCR: NEGATIVE
Influenza B by PCR: NEGATIVE
Resp Syncytial Virus by PCR: NEGATIVE
SARS Coronavirus 2 by RT PCR: NEGATIVE

## 2020-03-09 LAB — GLUCOSE, CAPILLARY: Glucose-Capillary: 146 mg/dL — ABNORMAL HIGH (ref 70–99)

## 2020-03-09 LAB — ECHOCARDIOGRAM COMPLETE
Area-P 1/2: 3.46 cm2
Height: 64 in
S' Lateral: 4.04 cm
Weight: 4560 oz

## 2020-03-09 LAB — MRSA PCR SCREENING: MRSA by PCR: NEGATIVE

## 2020-03-09 LAB — TSH: TSH: 7.818 u[IU]/mL — ABNORMAL HIGH (ref 0.350–4.500)

## 2020-03-09 LAB — HIV ANTIBODY (ROUTINE TESTING W REFLEX): HIV Screen 4th Generation wRfx: NONREACTIVE

## 2020-03-09 LAB — MAGNESIUM: Magnesium: 1.9 mg/dL (ref 1.7–2.4)

## 2020-03-09 LAB — CBG MONITORING, ED: Glucose-Capillary: 151 mg/dL — ABNORMAL HIGH (ref 70–99)

## 2020-03-09 IMAGING — DX DG CHEST 1V PORT
1 series · 1 of 1 positions shown · non-contrast
Comparison: [DATE]

CLINICAL DATA: Weakness

EXAM:
PORTABLE CHEST 1 VIEW

[chest ap]
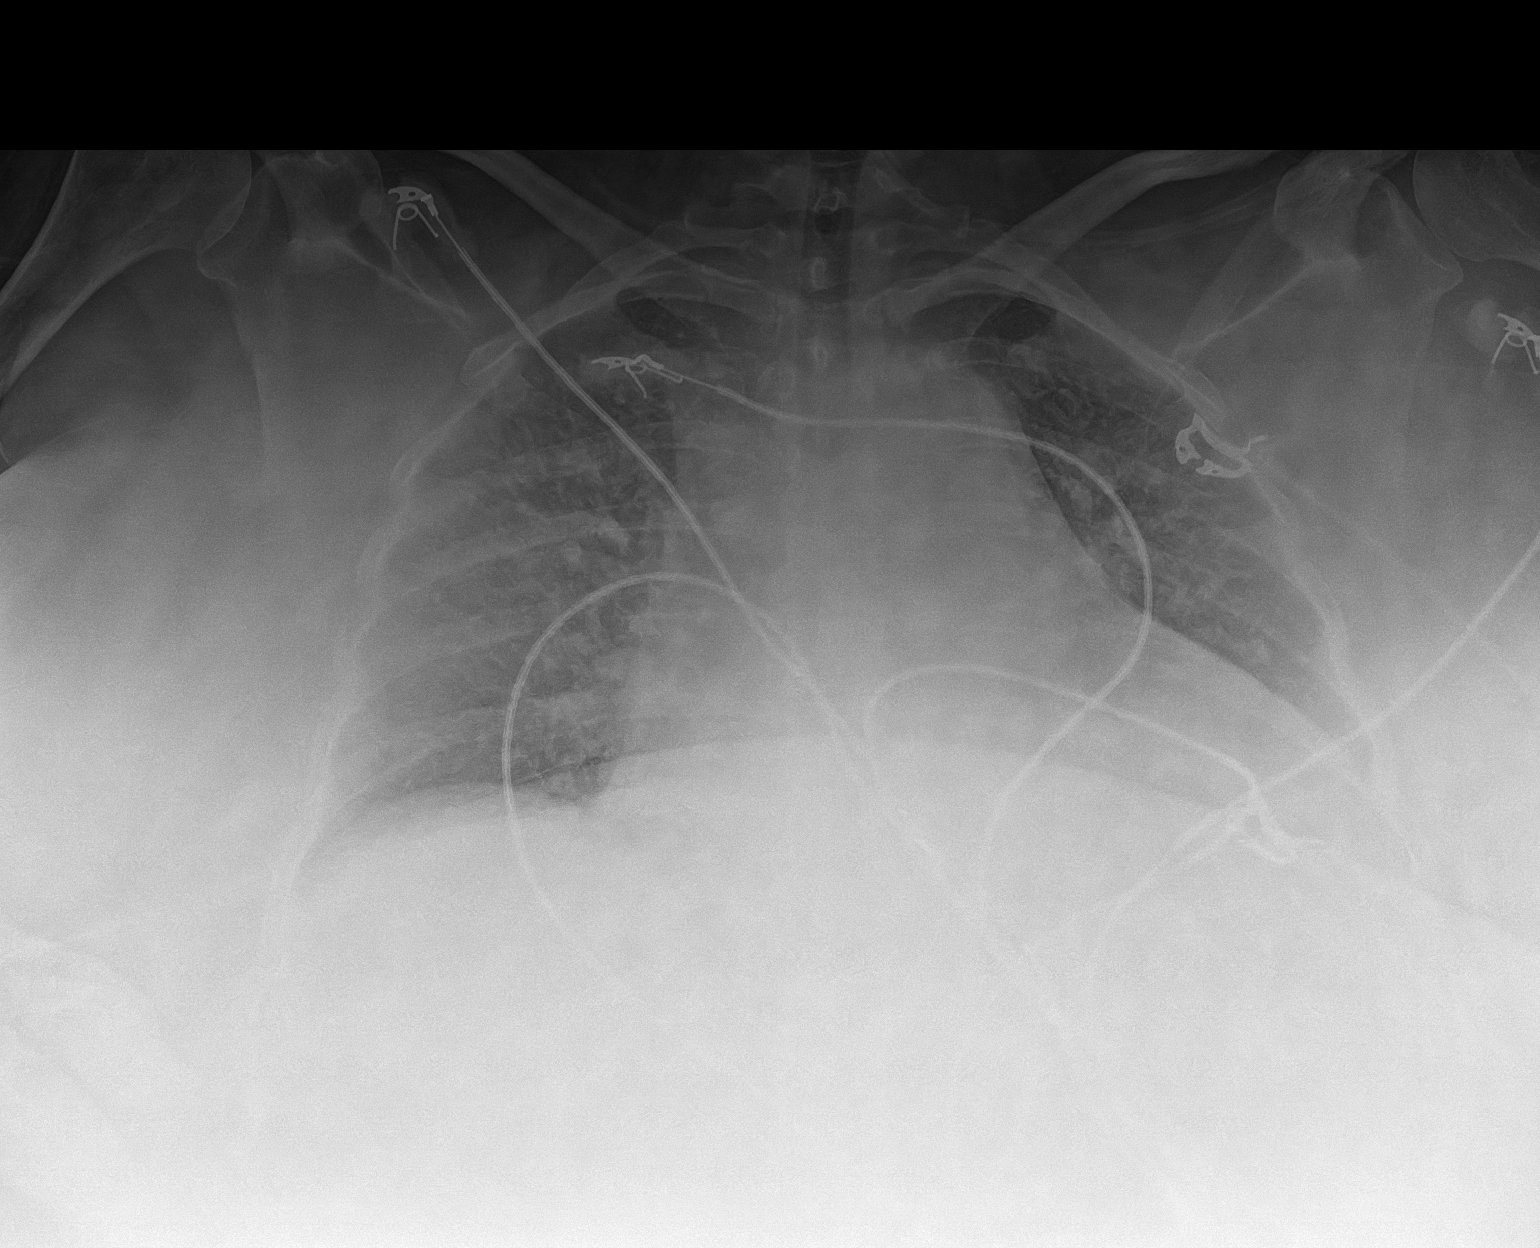

[1 of 1 positions shown; findings below may reference images not displayed]

FINDINGS: Suboptimal evaluation due to portable technique, body habitus, and
low lung volumes. Similar lung aeration with probable pulmonary
vascular congestion and possible mild superimposed edema. Similar
cardiomegaly.
IMPRESSION: Similar lung aeration with probable pulmonary vascular congestion
and possible mild superimposed edema. Cardiomegaly.

## 2020-03-09 MED ORDER — LABETALOL HCL 5 MG/ML IV SOLN
20.0000 mg | INTRAVENOUS | Status: DC | PRN
  Administered 2020-03-09 (×2): 20 mg via INTRAVENOUS
  Filled 2020-03-09 (×2): qty 4

## 2020-03-09 MED ORDER — ONDANSETRON HCL 4 MG/2ML IJ SOLN: 4.0000 mg | Freq: Four times a day (QID) | INTRAMUSCULAR | Status: DC | PRN

## 2020-03-09 MED ORDER — FUROSEMIDE 10 MG/ML IJ SOLN
40.0000 mg | Freq: Once | INTRAMUSCULAR | Status: AC
  Administered 2020-03-09: 40 mg via INTRAVENOUS
  Filled 2020-03-09: qty 4

## 2020-03-09 MED ORDER — ISOSORB DINITRATE-HYDRALAZINE 20-37.5 MG PO TABS
1.0000 | ORAL_TABLET | Freq: Two times a day (BID) | ORAL | Status: DC
  Administered 2020-03-09 – 2020-03-14 (×10): 1 via ORAL
  Filled 2020-03-09 (×18): qty 1

## 2020-03-09 MED ORDER — POTASSIUM CHLORIDE CRYS ER 20 MEQ PO TBCR
30.0000 meq | EXTENDED_RELEASE_TABLET | Freq: Two times a day (BID) | ORAL | Status: DC
  Administered 2020-03-09 – 2020-03-10 (×3): 30 meq via ORAL
  Filled 2020-03-09 (×4): qty 1

## 2020-03-09 MED ORDER — SODIUM CHLORIDE 0.9 % IV SOLN: 250.0000 mL | INTRAVENOUS | Status: DC | PRN

## 2020-03-09 MED ORDER — SODIUM CHLORIDE 0.9% FLUSH
3.0000 mL | Freq: Two times a day (BID) | INTRAVENOUS | Status: DC
  Administered 2020-03-09 – 2020-03-14 (×8): 3 mL via INTRAVENOUS

## 2020-03-09 MED ORDER — FUROSEMIDE 10 MG/ML IJ SOLN
60.0000 mg | Freq: Two times a day (BID) | INTRAMUSCULAR | Status: DC
  Administered 2020-03-09 – 2020-03-14 (×10): 60 mg via INTRAVENOUS
  Filled 2020-03-09 (×10): qty 6

## 2020-03-09 MED ORDER — METOLAZONE 5 MG PO TABS
2.5000 mg | ORAL_TABLET | Freq: Every day | ORAL | Status: DC
  Administered 2020-03-10 – 2020-03-12 (×3): 2.5 mg via ORAL
  Filled 2020-03-09 (×6): qty 1

## 2020-03-09 MED ORDER — INSULIN ASPART 100 UNIT/ML ~~LOC~~ SOLN
0.0000 [IU] | Freq: Three times a day (TID) | SUBCUTANEOUS | Status: DC
  Administered 2020-03-09: 3 [IU] via SUBCUTANEOUS
  Administered 2020-03-10: 2 [IU] via SUBCUTANEOUS
  Administered 2020-03-10 (×2): 3 [IU] via SUBCUTANEOUS
  Administered 2020-03-11 (×3): 5 [IU] via SUBCUTANEOUS
  Administered 2020-03-12 – 2020-03-13 (×4): 3 [IU] via SUBCUTANEOUS
  Administered 2020-03-13: 5 [IU] via SUBCUTANEOUS
  Administered 2020-03-13: 3 [IU] via SUBCUTANEOUS
  Administered 2020-03-14: 5 [IU] via SUBCUTANEOUS
  Administered 2020-03-14: 3 [IU] via SUBCUTANEOUS
  Filled 2020-03-09: qty 1

## 2020-03-09 MED ORDER — ESCITALOPRAM OXALATE 10 MG PO TABS
20.0000 mg | ORAL_TABLET | Freq: Every day | ORAL | Status: DC
  Administered 2020-03-10 – 2020-03-14 (×5): 20 mg via ORAL
  Filled 2020-03-09 (×5): qty 2

## 2020-03-09 MED ORDER — HYDRALAZINE HCL 20 MG/ML IJ SOLN
5.0000 mg | Freq: Once | INTRAMUSCULAR | Status: AC
  Administered 2020-03-09: 5 mg via INTRAVENOUS
  Filled 2020-03-09: qty 1

## 2020-03-09 MED ORDER — LOSARTAN POTASSIUM 50 MG PO TABS
50.0000 mg | ORAL_TABLET | Freq: Every day | ORAL | Status: DC
  Administered 2020-03-09 – 2020-03-14 (×6): 50 mg via ORAL
  Filled 2020-03-09 (×6): qty 1

## 2020-03-09 MED ORDER — PERFLUTREN LIPID MICROSPHERE
1.0000 mL | INTRAVENOUS | Status: AC | PRN
  Administered 2020-03-09: 2 mL via INTRAVENOUS
  Filled 2020-03-09: qty 10

## 2020-03-09 MED ORDER — CHLORHEXIDINE GLUCONATE CLOTH 2 % EX PADS
6.0000 | MEDICATED_PAD | Freq: Every day | CUTANEOUS | Status: DC
  Administered 2020-03-10: 6 via TOPICAL

## 2020-03-09 MED ORDER — NITROGLYCERIN 2 % TD OINT
1.0000 [in_us] | TOPICAL_OINTMENT | Freq: Once | TRANSDERMAL | Status: AC
  Administered 2020-03-09: 1 [in_us] via TOPICAL

## 2020-03-09 MED ORDER — INSULIN DETEMIR 100 UNIT/ML ~~LOC~~ SOLN
5.0000 [IU] | Freq: Every day | SUBCUTANEOUS | Status: DC
  Administered 2020-03-09 – 2020-03-10 (×2): 5 [IU] via SUBCUTANEOUS
  Filled 2020-03-09 (×5): qty 0.05

## 2020-03-09 MED ORDER — BISOPROLOL FUMARATE 10 MG PO TABS: 10.0000 mg | ORAL_TABLET | Freq: Every day | ORAL | Status: DC

## 2020-03-09 MED ORDER — ACETAMINOPHEN 325 MG PO TABS: 650.0000 mg | ORAL_TABLET | ORAL | Status: DC | PRN

## 2020-03-09 MED ORDER — CARVEDILOL 3.125 MG PO TABS
6.2500 mg | ORAL_TABLET | Freq: Two times a day (BID) | ORAL | Status: DC
  Administered 2020-03-09 – 2020-03-14 (×10): 6.25 mg via ORAL
  Filled 2020-03-09 (×11): qty 2

## 2020-03-09 MED ORDER — BUPROPION HCL ER (XL) 150 MG PO TB24
150.0000 mg | ORAL_TABLET | Freq: Every day | ORAL | Status: DC
  Administered 2020-03-10 – 2020-03-14 (×5): 150 mg via ORAL
  Filled 2020-03-09 (×5): qty 1

## 2020-03-09 MED ORDER — NITROGLYCERIN 0.4 MG SL SUBL
SUBLINGUAL_TABLET | SUBLINGUAL | Status: AC
  Filled 2020-03-09: qty 1

## 2020-03-09 MED ORDER — SODIUM CHLORIDE 0.9% FLUSH: 3.0000 mL | INTRAVENOUS | Status: DC | PRN

## 2020-03-09 MED ORDER — HEPARIN SODIUM (PORCINE) 5000 UNIT/ML IJ SOLN
5000.0000 [IU] | Freq: Three times a day (TID) | INTRAMUSCULAR | Status: DC
  Administered 2020-03-09 – 2020-03-14 (×14): 5000 [IU] via SUBCUTANEOUS
  Filled 2020-03-09 (×15): qty 1

## 2020-03-09 NOTE — ED Notes (Signed)
Daughter, Elicia Lui,( lives out of town) called to check on her Mom.  [phone].

## 2020-03-09 NOTE — ED Notes (Signed)
Echo at bedside at this time

## 2020-03-09 NOTE — ED Triage Notes (Signed)
PT arrived via ems.  Reports sob x 4 months ago.  Was given lasix 3 months ago for fluid buildup but hasn't followed up with a doctor.  Ran out of lasix a few days ago.  EMS reports pt came to ed last night for sob and left ama.  Reports hypertensive,sinuss tach on monitor.   Pt has swelling in feet, legs, and abd.  BP 202/156, 95% on room air.  EMS administered o2 at 3 liters.

## 2020-03-09 NOTE — ED Notes (Signed)
Dr. Gwenlyn Perking paged and made aware of patient's most recent blood pressure and mental status. This RN notes that patient is harder to arouse than on initial assessment and hard to maintain awakness. Currently awaiting a page back at this time.

## 2020-03-09 NOTE — ED Provider Notes (Signed)
[facility] EMERGENCY DEPARTMENT Provider Note   CSN: [id number] Arrival date & time: 03/09/20  1010     History Chief Complaint  Patient presents with  . Shortness of Breath    Connie Shepard is a 54 y.o. female.  Patient complains of shortness of breath.  Patient was seen yesterday evening with anasarca and shortness of breath and it was a recommendation of her physician in the emergency department to be admitted.  The patient decided to go home instead but she returned today with worsening symptoms  The history is provided by the patient. No language interpreter was used.  Shortness of Breath Severity:  Moderate Onset quality:  Gradual Timing:  Constant Progression:  Worsening Chronicity:  Recurrent Context: activity   Relieved by:  Nothing Worsened by:  Nothing Ineffective treatments:  None tried Associated symptoms: no abdominal pain, no chest pain, no cough, no headaches and no rash        Past Medical History:  Diagnosis Date  . Acute heart failure (HCC)   . ADD (attention deficit disorder)   . Bronchitis   . Depression   . Elevated troponin   . Hypertension   . Obesity     Patient Active Problem List   Diagnosis Date Noted  . Acute on chronic systolic CHF (congestive heart failure) (HCC) 03/09/2020  . Hyperglycemia 12/22/2018  . Hypertensive crisis 04/04/2018  . Acute CHF (congestive heart failure) (HCC) 04/04/2018  . Mild renal insufficiency 04/04/2018  . Elevated troponin 04/04/2018  . Bronchitis   . Obesity   . Seborrheic dermatitis of scalp 05/23/2016  . Hypertension 12/02/2015  . Depression 12/02/2015  . Primary insomnia 12/02/2015  . Body mass index 45.0-49.9, adult (HCC) 12/02/2015  . Attention deficit hyperactivity disorder (ADHD), predominantly inattentive type 12/02/2015    History reviewed. No pertinent surgical history.   OB History   No obstetric history on file.     Family History  Problem Relation Age of Onset  . Kidney  Stones Mother   . Hypertension Mother   . Heart disease Father     Social History   Tobacco Use  . Smoking status: Never Smoker  . Smokeless tobacco: Never Used  Vaping Use  . Vaping Use: Never used  Substance Use Topics  . Alcohol use: No  . Drug use: No    Home Medications Prior to Admission medications   Medication Sig Start Date End Date Taking? Authorizing Provider  buPROPion (WELLBUTRIN XL) 150 MG 24 hr tablet Take 1 tablet (150 mg total) by mouth daily. 12/17/18   Remus Loffler, PA-C  carvedilol (COREG) 6.25 MG tablet Take 1 tablet (6.25 mg total) by mouth 2 (two) times daily with a meal. 12/17/18   Remus Loffler, PA-C  escitalopram (LEXAPRO) 20 MG tablet Take 1 tablet (20 mg total) by mouth daily. 12/17/18   Remus Loffler, PA-C  furosemide (LASIX) 40 MG tablet TAKE ONE (1) TABLET EACH DAY 12/17/18   Remus Loffler, PA-C  furosemide (LASIX) 40 MG tablet Take 2 tablets (80 mg total) by mouth daily for 7 days. 03/08/20 03/15/20  Melene Plan, DO  hydrALAZINE (APRESOLINE) 50 MG tablet TAKE 1/2 TABLET 3 TIMES DAILY 09/03/18   Remus Loffler, PA-C  losartan (COZAAR) 100 MG tablet TAKE ONE (1) TABLET EACH DAY 12/17/18   Remus Loffler, PA-C  metFORMIN (GLUCOPHAGE) 500 MG tablet Take 1 tablet (500 mg total) by mouth daily with breakfast. 12/17/18   Remus Loffler, PA-C  spironolactone (ALDACTONE) 25 MG tablet Take 1 tablet (25 mg total) by mouth daily. 12/17/18   Remus Loffler, PA-C  zolpidem (AMBIEN) 10 MG tablet Take 1 tablet (10 mg total) by mouth at bedtime as needed for sleep. 05/01/19 05/31/19  Remus Loffler, PA-C    Allergies    Hydroxyzine  Review of Systems   Review of Systems  Constitutional: Negative for appetite change and fatigue.  HENT: Negative for congestion, ear discharge and sinus pressure.   Eyes: Negative for discharge.  Respiratory: Positive for shortness of breath. Negative for cough.   Cardiovascular: Negative for chest pain.  Gastrointestinal: Negative for  abdominal pain and diarrhea.  Genitourinary: Negative for frequency and hematuria.  Musculoskeletal: Negative for back pain.  Skin: Negative for rash.  Neurological: Negative for seizures and headaches.  Psychiatric/Behavioral: Negative for hallucinations.    Physical Exam Updated Vital Signs BP (!) 148/93 (BP Location: Left Arm)   Pulse (!) 104   Temp 98.2 F (36.8 C) (Oral)   Resp 20   Ht 5\' 4"  (1.626 m)   Wt 129.3 kg   SpO2 93%   BMI 48.92 kg/m   Physical Exam Vitals and nursing note reviewed.  Constitutional:      Appearance: She is well-developed.  HENT:     Head: Normocephalic.     Nose: Nose normal.  Eyes:     General: No scleral icterus.    Conjunctiva/sclera: Conjunctivae normal.  Neck:     Thyroid: No thyromegaly.  Cardiovascular:     Rate and Rhythm: Normal rate and regular rhythm.     Heart sounds: No murmur heard.  No friction rub. No gallop.   Pulmonary:     Breath sounds: No stridor. No wheezing or rales.     Comments: Short of breath Chest:     Chest wall: No tenderness.  Abdominal:     General: There is no distension.     Tenderness: There is no abdominal tenderness. There is no rebound.     Comments: Patient has edema up to her umbilicus  Musculoskeletal:     Cervical back: Neck supple.     Comments: 3+ edema all the way up her legs.  Lymphadenopathy:     Cervical: No cervical adenopathy.  Skin:    Findings: No erythema or rash.  Neurological:     Mental Status: She is alert and oriented to person, place, and time.     Motor: No abnormal muscle tone.     Coordination: Coordination normal.  Psychiatric:        Behavior: Behavior normal.     ED Results / Procedures / Treatments   Labs (all labs ordered are listed, but only abnormal results are displayed) Labs Reviewed  RESP PANEL BY RT-PCR (RSV, FLU A&B, COVID)  [id number]    EKG EKG Interpretation  Date/Time:  Wednesday March 09 2020 10:27:34 EST Ventricular Rate:  108 PR  Interval:    QRS Duration: 81 QT Interval:  376 QTC Calculation: 504 R Axis:   6 Text Interpretation: Sinus tachycardia Ventricular premature complex Aberrant conduction of SV complex(es) Low voltage, precordial leads Borderline T abnormalities, diffuse leads Borderline prolonged QT interval Confirmed by [phone]) on 03/09/2020 11:17:37 AM   Radiology DG Chest Port 1 View  Result Date: 03/09/2020 CLINICAL DATA:  Weakness EXAM: PORTABLE CHEST 1 VIEW COMPARISON:  03/08/2020 FINDINGS: Suboptimal evaluation due to portable technique, body habitus, and low lung volumes. Similar lung aeration with probable pulmonary vascular  congestion and possible mild superimposed edema. Similar cardiomegaly. IMPRESSION: Similar lung aeration with probable pulmonary vascular congestion and possible mild superimposed edema. Cardiomegaly. Electronically Signed   By: Guadlupe Spanish M.D.   On: 03/09/2020 10:50   DG Chest Port 1 View  Result Date: 03/08/2020 CLINICAL DATA:  Shortness of breath for several weeks, chronic edema EXAM: PORTABLE CHEST 1 VIEW COMPARISON:  Portable exam 1756 hours compared to 04/04/2018 FINDINGS: Enlargement of cardiac silhouette with pulmonary vascular congestion. Interstitial infiltrate likely pulmonary edema and CHF. No gross pleural effusion or pneumothorax. Osseous structures unremarkable. IMPRESSION: Enlargement of cardiac silhouette with pulmonary vascular congestion and probable mild pulmonary edema. Electronically Signed   By: Ulyses Southward M.D.   On: 03/08/2020 18:26    Procedures Procedures (including critical care time)  Medications Ordered in ED Medications  heparin injection 5,000 Units (has no administration in time range)  furosemide (LASIX) injection 40 mg (40 mg Intravenous Given 03/09/20 1104)  nitroGLYCERIN (NITROGLYN) 2 % ointment 1 inch (1 inch Topical Given 03/09/20 1044)  furosemide (LASIX) injection 40 mg (40 mg Intravenous Given 03/09/20 1105)   hydrALAZINE (APRESOLINE) injection 5 mg (5 mg Intravenous Given 03/09/20 1122)    ED Course  I have reviewed the triage vital signs and the nursing notes.  Pertinent labs & imaging results that were available during my care of the patient were reviewed by me and considered in my medical decision making (see chart for details). CRITICAL CARE Performed by: Bethann Berkshire Total critical care time: Critical care time was exclusive of separately billable procedures and treating other patients. Critical care was necessary to treat or prevent imminent or life-threatening deterioration. Critical care was time spent personally by me on the following activities: development of treatment plan with patient and/or surrogate as well as nursing, discussions with consultants, evaluation of patient's response to treatment, examination of patient, obtaining history from patient or surrogate, ordering and performing treatments and interventions, ordering and review of laboratory studies, ordering and review of radiographic studies, pulse oximetry and re-evaluation of patient's condition.    MDM Rules/Calculators/A&P                          Patient with significantly poorly controlled blood pressure.  Along with anasarca from congestive heart failure.  Patient was given Apresoline and nitroglycerin for blood pressure along with Lasix for her heart failure she will be admitted by medicine for diuresis Final Clinical Impression(s) / ED Diagnoses Final diagnoses:  Anasarca    Rx / DC Orders ED Discharge Orders    None       Bethann Berkshire, MD 03/09/20 1213

## 2020-03-09 NOTE — Progress Notes (Signed)
*  PRELIMINARY RESULTS* Echocardiogram 2D Echocardiogram with definity has been performed.  Connie Shepard 03/09/2020, 3:56 PM

## 2020-03-09 NOTE — ED Notes (Signed)
Pt removed from 2L [place] at this time. Per Dr. Estell Harpin, okay to place on oxygen if SpO2 declines below 90%. This RN in agreement and verbalized understanding at this time.

## 2020-03-09 NOTE — ED Notes (Signed)
Entered room and introduced self to patient. On arrival to room, patient appears sleepy, slow to answer questions, yet arousable.  Pt appears short of breath, tachypenic while resting. Pt on 2L [place] at the time of assessment. Bed is locked in the lowest position, side rails x2, call bell within reach. Light dimmed and curtain open for visualization. Educated on call light use and hourly rounding. Cardiac monitor in place at this time, will continue to monitor.

## 2020-03-09 NOTE — Progress Notes (Signed)
CPAP is PRN order.  Patient is currently on 2L with a sat of 99%.

## 2020-03-09 NOTE — TOC Initial Note (Signed)
Transition of Care [facility]) - Initial/Assessment Note    Patient Details  Name: Connie Shepard MRN: [id number] Date of Birth: 1965-05-03  [facility]) CM/SW Contact:    Villa Herb, LCSWA Phone Number: 03/09/2020, 6:25 PM  Clinical Narrative:                 Pt admitted for acute on chronic systolic CHF TOC received CHF consult. CSW spoke with pts father Audelia Acton [phone] to complete pt assessment. Pt lives with her parents. Pts father states that pt is normally able to complete ADLs however when pt got sick a few weeks ago she could not do anything. Pts mother and sister have been assisting pt with ADLs. Pt does drive, however has not since being sick. Pt has not had HH services. Pt uses a wheelchair at home since getting sick per pts father.   CSW completed CHF screen with pts father Audelia Acton. Pt does not weigh daily. CSW provided education on importance of daily weights and when to reach out to cardiologist. Pt is not followed by a cardiologist currently. Pt does not follow a heart healthy diet. CSW educated pts father on importance of a heart healthy diet for pt. Pt does take all medications as prescribed. TOC to follow for possible d/c needs.   Expected Discharge Plan: Home/Self Care Barriers to Discharge: Continued Medical Work up   Patient Goals and CMS Choice Patient states their goals for this hospitalization and ongoing recovery are:: Return home   Choice offered to / list presented to : NA  Expected Discharge Plan and Services Expected Discharge Plan: Home/Self Care In-house Referral: Clinical Social Work Discharge Planning Services: CM Consult Post Acute Care Choice: NA Living arrangements for the past 2 months: Single Family Home                                      Prior Living Arrangements/Services Living arrangements for the past 2 months: Single Family Home Lives with:: Parents Patient language and need for interpreter reviewed:: Yes Do you  feel safe going back to the place where you live?: Yes        Care giver support system in place?: Yes (comment) Mitzi Davenport (Mother) [phone]) Current home services: DME Furniture conservator/restorer) Criminal Activity/Legal Involvement Pertinent to Current Situation/Hospitalization: No - Comment as needed  Activities of Daily Living Home Assistive Devices/Equipment: None ADL Screening (condition at time of admission) Patient's cognitive ability adequate to safely complete daily activities?: No Is the patient deaf or have difficulty hearing?: No Does the patient have difficulty seeing, even when wearing glasses/contacts?: No Does the patient have difficulty concentrating, remembering, or making decisions?: Yes Patient able to express need for assistance with ADLs?: No Does the patient have difficulty dressing or bathing?: Yes Independently performs ADLs?: No Communication: Independent Dressing (OT): Dependent Is this a change from baseline?: Change from baseline, expected to last <3days Grooming: Dependent Is this a change from baseline?: Change from baseline, expected to last <3 days Feeding: Dependent Is this a change from baseline?: Change from baseline, expected to last <3 days Bathing: Dependent Is this a change from baseline?: Change from baseline, expected to last <3 days Toileting: Dependent Is this a change from baseline?: Change from baseline, expected to last <3 days In/Out Bed: Dependent Is this a change from baseline?: Change from baseline, expected to last <3 days Walks in Home: Dependent Is this  a change from baseline?: Change from baseline, expected to last <3 days Does the patient have difficulty walking or climbing stairs?: Yes Weakness of Legs: Both Weakness of Arms/Hands: None  Permission Sought/Granted                  Emotional Assessment         Alcohol / Substance Use: Not Applicable Psych Involvement: No (comment)  Admission diagnosis:  Acute on  chronic systolic CHF (congestive heart failure) (HCC) [I50.23] Acute on chronic systolic HF (heart failure) (HCC) [I50.23] Patient Active Problem List   Diagnosis Date Noted  . Acute on chronic systolic CHF (congestive heart failure) (HCC) 03/09/2020  . Acute on chronic systolic HF (heart failure) (HCC) 03/09/2020  . Hyperglycemia 12/22/2018  . Hypertensive crisis 04/04/2018  . Acute CHF (congestive heart failure) (HCC) 04/04/2018  . Mild renal insufficiency 04/04/2018  . Elevated troponin 04/04/2018  . Bronchitis   . Obesity   . Seborrheic dermatitis of scalp 05/23/2016  . Hypertension 12/02/2015  . Depression 12/02/2015  . Primary insomnia 12/02/2015  . Body mass index 45.0-49.9, adult (HCC) 12/02/2015  . Attention deficit hyperactivity disorder (ADHD), predominantly inattentive type 12/02/2015   PCP:  Remus Loffler, PA-C Pharmacy:   [facility] - [place], [place] - [address] [place] [address] Phone: [phone] Fax: [phone]  [facility] - [place], [place] - [address] [place] [address] Phone: [phone] Fax: [phone]  [id number] - [place], [place] - [address] [place] [address] Phone: [phone] Fax: [phone]     Social Determinants of Health (SDOH) Interventions    Readmission Risk Interventions No flowsheet data found.

## 2020-03-09 NOTE — H&P (Signed)
History and Physical    Connie Shepard WUJ:[id number] DOB: [birth year] DOA: 03/09/2020  PCP: Remus Loffler, PA-C   Patient coming from: Home  I have personally briefly reviewed patient's old medical records in [facility]  Chief Complaint: Shortness of breath/fluid overload.  HPI: Connie Shepard is a 54 y.o. female with medical history significant of morbid obesity, depression/anxiety, systolic heart failure (last echo with ejection fraction 30 to 35%), hypertension, medication noncompliance and type 2 diabetes; presented to the emergency department with ongoing shortness of breath and fluid overload.  Patient expressed that her symptoms have been present for the last 3-4 weeks and worsening.  She reports lack of medication compliance, follow-up with outpatient providers and expressed ongoing diet indiscretion.  Patient denies fever, chest pain, nausea, vomiting, dysuria, hematuria, melena, hematochezia, focal neurologic deficits or any other complaints.   Of note, she does not use oxygen supplementation chronically.  ED Course: Massive fluid overload; chest x-ray with vascular congestion and pulmonary edema.  Patient using 2 L nasal cannula supplementation.  Accepted to be admitted for further evaluation and management of acute on chronic systolic heart failure.  IV Lasix initiated in the ED.  Review of Systems: As per HPI otherwise all other systems reviewed and are negative.   Past Medical History:  Diagnosis Date  . Acute heart failure (HCC)   . ADD (attention deficit disorder)   . Bronchitis   . Depression   . Elevated troponin   . Hypertension   . Obesity     History reviewed. No pertinent surgical history.  Social History  reports that she has never smoked. She has never used smokeless tobacco. She reports that she does not drink alcohol and does not use drugs.  Allergies  Allergen Reactions  . Hydroxyzine     Bad dreams    Family History  Problem Relation Age of  Onset  . Kidney Stones Mother   . Hypertension Mother   . Heart disease Father     Prior to Admission medications   Medication Sig Start Date End Date Taking? Authorizing Provider  buPROPion (WELLBUTRIN XL) 150 MG 24 hr tablet Take 1 tablet (150 mg total) by mouth daily. 12/17/18  Yes Remus Loffler, PA-C  carvedilol (COREG) 6.25 MG tablet Take 1 tablet (6.25 mg total) by mouth 2 (two) times daily with a meal. 12/17/18  Yes Remus Loffler, PA-C  escitalopram (LEXAPRO) 20 MG tablet Take 1 tablet (20 mg total) by mouth daily. 12/17/18  Yes Remus Loffler, PA-C  furosemide (LASIX) 40 MG tablet Take 2 tablets (80 mg total) by mouth daily for 7 days. 03/08/20 03/15/20 Yes Melene Plan, DO  hydrALAZINE (APRESOLINE) 50 MG tablet TAKE 1/2 TABLET 3 TIMES DAILY 09/03/18  Yes Remus Loffler, PA-C  losartan (COZAAR) 100 MG tablet TAKE ONE (1) TABLET EACH DAY 12/17/18  Yes Remus Loffler, PA-C  metFORMIN (GLUCOPHAGE) 500 MG tablet Take 1 tablet (500 mg total) by mouth daily with breakfast. 12/17/18  Yes Remus Loffler, PA-C  spironolactone (ALDACTONE) 25 MG tablet Take 1 tablet (25 mg total) by mouth daily. 12/17/18  Yes Remus Loffler, PA-C  furosemide (LASIX) 40 MG tablet TAKE ONE (1) TABLET EACH DAY Patient not taking: Reported on 03/09/2020 12/17/18   Remus Loffler, PA-C  zolpidem (AMBIEN) 10 MG tablet Take 1 tablet (10 mg total) by mouth at bedtime as needed for sleep. 05/01/19 05/31/19  Remus Loffler, PA-C    Physical Exam: Vitals:  03/09/20 1745 03/09/20 1800 03/09/20 1815 03/09/20 1830  BP:  (!) 184/118  (!) 196/118  Pulse:    89  Resp: (!) 22 20 20 19   Temp:      TempSrc:      SpO2:    96%  Weight:      Height:        Constitutional: Expressing difficulty breathing and having orthopnea.  No chest pain, no nausea, no vomiting.  Patient having difficulty keeping herself awake and being aroused. Vitals:   03/09/20 1745 03/09/20 1800 03/09/20 1815 03/09/20 1830  BP:  (!) 184/118  (!) 196/118  Pulse:     89  Resp: (!) 22 20 20 19   Temp:      TempSrc:      SpO2:    96%  Weight:      Height:       Eyes: PERRL, lids and conjunctivae normal; no icterus ENMT: Mucous membranes are moist. Posterior pharynx clear of any exudate or lesions. Neck: normal, supple, no masses, no thyromegaly Respiratory: clear to auscultation bilaterally, no wheezing, no crackles. Normal respiratory effort. No accessory muscle use.  Cardiovascular: Regular rate and rhythm, no murmurs / rubs / gallops. Abdomen: Obese, with appreciated increase abdominal girth; no tenderness, no masses palpated. Bowel sounds positive.  Musculoskeletal: no clubbing / cyanosis.  3+ edema bilaterally extending forward to her thighs. Normal muscle tone.  Skin: no rashes, lesions, ulcers. No induration Neurologic: CN 2-12 grossly intact.  Having slight difficulty to be aroused and profoundly sleepy. Psychiatric: No agitation, no suicidal ideation or hallucinations.    Labs on Admission: I have personally reviewed following labs and imaging studies  CBC: Recent Labs  Lab 03/08/20 1735 03/09/20 1600  WBC 8.7 7.9  NEUTROABS 7.1 5.7  HGB 12.8 13.0  HCT 41.6 41.7  MCV 101.5* 102.0*  PLT 236 228    Basic Metabolic Panel: Recent Labs  Lab 03/08/20 1735 03/09/20 1600  NA 138 141  K 3.3* 3.2*  CL 102 103  CO2 26 26  GLUCOSE 214* 186*  BUN 10 10  CREATININE 1.04* 1.00  CALCIUM 9.1 9.2  MG  --  1.9    GFR: Estimated Creatinine Clearance: 87.8 mL/min (by C-G formula based on SCr of 1 mg/dL).  Liver Function Tests: Recent Labs  Lab 03/08/20 1735 03/09/20 1600  AST 16 15  ALT 11 9  ALKPHOS 69 67  BILITOT 1.5* 1.2  PROT 6.6 6.9  ALBUMIN 3.2* 3.4*    Urine analysis:    Component Value Date/Time   COLORURINE STRAW (A) 04/04/2018 0743   APPEARANCEUR CLEAR 04/04/2018 0743   LABSPEC 1.004 (L) 04/04/2018 0743   PHURINE 7.0 04/04/2018 0743   GLUCOSEU NEGATIVE 04/04/2018 0743   HGBUR NEGATIVE 04/04/2018 0743    BILIRUBINUR NEGATIVE 04/04/2018 0743   KETONESUR NEGATIVE 04/04/2018 0743   PROTEINUR NEGATIVE 04/04/2018 0743   UROBILINOGEN 0.2 03/19/2010 1239   NITRITE NEGATIVE 04/04/2018 0743   LEUKOCYTESUR NEGATIVE 04/04/2018 0743    Radiological Exams on Admission: DG Chest Port 1 View  Result Date: 03/09/2020 CLINICAL DATA:  Weakness EXAM: PORTABLE CHEST 1 VIEW COMPARISON:  03/08/2020 FINDINGS: Suboptimal evaluation due to portable technique, body habitus, and low lung volumes. Similar lung aeration with probable pulmonary vascular congestion and possible mild superimposed edema. Similar cardiomegaly. IMPRESSION: Similar lung aeration with probable pulmonary vascular congestion and possible mild superimposed edema. Cardiomegaly. Electronically Signed   By: Guadlupe SpanishPraneil  Patel M.D.   On: 03/09/2020 10:50  DG Chest Port 1 View  Result Date: 03/08/2020 CLINICAL DATA:  Shortness of breath for several weeks, chronic edema EXAM: PORTABLE CHEST 1 VIEW COMPARISON:  Portable exam 1756 hours compared to 04/04/2018 FINDINGS: Enlargement of cardiac silhouette with pulmonary vascular congestion. Interstitial infiltrate likely pulmonary edema and CHF. No gross pleural effusion or pneumothorax. Osseous structures unremarkable. IMPRESSION: Enlargement of cardiac silhouette with pulmonary vascular congestion and probable mild pulmonary edema. Electronically Signed   By: Ulyses Southward M.D.   On: 03/08/2020 18:26   ECHOCARDIOGRAM COMPLETE  Result Date: 03/09/2020    ECHOCARDIOGRAM REPORT   Patient Name:   PANZY BUBECK Date of Exam: 03/09/2020 Medical Rec #:  [id number]    Height:       64.0 in Accession #:    [id number]   Weight:       285.0 lb Date of Birth:  07-05-1965   BSA:          2.274 m Patient Age:    53 years     BP:           195/144 mmHg Patient Gender: F            HR:           97 bpm. [facility] Procedure: 2D Echo Indications:    CHF-Acute Systolic 428.21 / I50.21  History:        Patient has  prior history of Echocardiogram examinations, most                 recent 04/04/2018. CHF; Risk Factors:Hypertension and                 Non-Smoker. Elevated Troponin.  Sonographer:    Jeryl Columbia RDCS (AE) Referring Phys: [id number] Jaunita Mikels IMPRESSIONS  1. Very limited images.  2. Left ventricular ejection fraction, by estimation, is approximately 35%. The left ventricle has moderately decreased function. Left ventricular endocardial border not optimally defined to evaluate regional wall motion even with Definity contrast. There is mild left ventricular hypertrophy. Left ventricular diastolic parameters are indeterminate.  3. Right ventricular systolic function is severely reduced. The right ventricular size is normal.  4. The mitral valve is grossly normal. Trivial mitral valve regurgitation.  5. The aortic valve is tricuspid. There is mild calcification of the aortic valve. Aortic valve regurgitation is not visualized.  6. Unable to estimate CVP. FINDINGS  Left Ventricle: Left ventricular ejection fraction, by estimation, is 35%. The left ventricle has moderately decreased function. Left ventricular endocardial border not optimally defined to evaluate regional wall motion. Definity contrast agent was given IV to delineate the left ventricular endocardial borders. The left ventricular internal cavity size was normal in size. There is mild left ventricular hypertrophy. Left ventricular diastolic parameters are indeterminate. Right Ventricle: The right ventricular size is normal. No increase in right ventricular wall thickness. Right ventricular systolic function is severely reduced. Left Atrium: Left atrial size was normal in size. Right Atrium: Right atrial size was normal in size. Pericardium: There is no evidence of pericardial effusion. Mitral Valve: The mitral valve is grossly normal. Mild mitral annular calcification. Trivial mitral valve regurgitation. Tricuspid Valve: The tricuspid valve is grossly  normal. Tricuspid valve regurgitation is mild. Aortic Valve: The aortic valve is tricuspid. There is mild calcification of the aortic valve. Aortic valve regurgitation is not visualized. Pulmonic Valve: The pulmonic valve was grossly normal. Pulmonic valve regurgitation is trivial. Aorta: The aortic root is normal in size and structure.  Venous: Unable to estimate CVP. The inferior vena cava was not well visualized. IAS/Shunts: The interatrial septum was not well visualized.  LEFT VENTRICLE PLAX 2D LVIDd:         4.89 cm  Diastology LVIDs:         4.04 cm  LV e' medial:    4.56 cm/s LV PW:         1.73 cm  LV E/e' medial:  16.6 LV IVS:        1.16 cm  LV e' lateral:   6.31 cm/s LVOT diam:     1.90 cm  LV E/e' lateral: 12.0 LVOT Area:     2.84 cm  RIGHT VENTRICLE RV S prime:     7.10 cm/s TAPSE (M-mode): 1.7 cm LEFT ATRIUM           Index       RIGHT ATRIUM           Index LA diam:      4.80 cm 2.11 cm/m  RA Area:     15.70 cm LA Vol (A4C): 71.2 ml 31.31 ml/m RA Volume:   47.20 ml  20.75 ml/m   AORTA Ao Root diam: 3.00 cm MITRAL VALVE               TRICUSPID VALVE MV Area (PHT): 3.46 cm    TR Peak grad:   38.4 mmHg MV Decel Time: 219 msec    TR Vmax:        310.00 cm/s MV E velocity: 75.80 cm/s                            SHUNTS                            Systemic Diam: 1.90 cm Nona Dell MD Electronically signed by Nona Dell MD Signature Date/Time: 03/09/2020/4:09:39 PM    Final     EKG: Independently reviewed.  No acute ischemic changes appreciated.  Sinus rhythm and normal rate.  Assessment/Plan 1-Acute on chronic systolic CHF (congestive heart failure) (HCC) -Patient massively fluid overload, with anasarca, orthopnea and ongoing shortness of breath/limited activity. -Will be admitted to the hospital for IV diuresis -Follow electrolytes and renal function closely -Check daily weights and strict I's and O's -Repeat 2D echo -EKG and lack of chest pain reassuring. -Will provide oxygen  supplementation and wean off as tolerated. -Given patient's body habitus and presence of vascular congestion/pulmonary edema on chest x-ray at nighttime will use CPAP to assist facilitating loss expansion and control of orthopnea symptoms. -Last echo with ejection fraction of 30 to 35% -Continue the use of beta-blocker and ARBs.  2-hypertensive crisis -Patient will be started on BiDil -Continue ARB and beta-blocker -Will use as needed labetalol -Continue IV diuresis -If needed will initiate nitroglycerin drip.  3-anxiety -Continue Lexapro and Wellbutrin -No suicidal ideation or hallucination.  4-morbid obesity -Body mass index is 48.92 kg/m. -Low calorie diet, portion control and increase physical activity discussed with patient -Patient will require outpatient follow-up of bariatric clinic.  5-type 2 diabetes -Sliding scale insulin and Levemir has been started -Will check A1c  6-hypokalemia -In the setting of diuretic management -Will follow electrolytes trend and provide repletion as needed -Checking magnesium level.   DVT prophylaxis: Heparin Code Status:   Full code Family Communication:  No family at bedside Disposition Plan:   Patient is from:  Home  Anticipated DC to:  To be determined  Anticipated DC date:  To be determined  Anticipated DC barriers: Stabilization of patient's respiratory and volume status  Consults called:  None Admission status:  Inpatient, length of stay more than 2 midnights; stepdown bed.  Severity of Illness: Moderate to severe illness; will be admitted to stepdown bed for acute diuresis, control of blood pressure, repeat 2D echo and close monitoring of her respiratory status.  Patient is at this point having difficulty to be arouse with severe massive fluid overload.    Vassie Loll MD [facility]  How to contact the [facility] Attending or Consulting provider 7A - 7P or covering provider during after hours 7P -7A, for this patient?    1. Check the care team in [facility] and look for a) attending/consulting [facility] provider listed and b) the [facility] team listed 2. Log into [email or web address] and use 's universal password to access. If you do not have the password, please contact the hospital operator. 3. Locate the [facility] provider you are looking for under [facility] and page to a number that you can be directly reached. 4. If you still have difficulty reaching the provider, please page the [facility] (Director on Call) for the Hospitalists listed on amion for assistance.  03/09/2020, 7:05 PM

## 2020-03-09 NOTE — ED Notes (Signed)
This RN provided an update in patient and plan of care to mother. All questions and concerns voiced addressed at this time.

## 2020-03-10 DIAGNOSIS — I169 Hypertensive crisis, unspecified: Secondary | ICD-10-CM | POA: Diagnosis not present

## 2020-03-10 DIAGNOSIS — I5023 Acute on chronic systolic (congestive) heart failure: Secondary | ICD-10-CM | POA: Diagnosis not present

## 2020-03-10 DIAGNOSIS — R601 Generalized edema: Secondary | ICD-10-CM | POA: Diagnosis not present

## 2020-03-10 LAB — BASIC METABOLIC PANEL
Anion gap: 10 (ref 5–15)
BUN: 10 mg/dL (ref 6–20)
CO2: 32 mmol/L (ref 22–32)
Calcium: 9.1 mg/dL (ref 8.9–10.3)
Chloride: 102 mmol/L (ref 98–111)
Creatinine, Ser: 1.04 mg/dL — ABNORMAL HIGH (ref 0.44–1.00)
GFR, Estimated: >60 mL/min (ref >60)
Glucose, Bld: 149 mg/dL — ABNORMAL HIGH (ref 70–99)
Potassium: 3.6 mmol/L (ref 3.5–5.1)
Sodium: 144 mmol/L (ref 135–145)

## 2020-03-10 LAB — GLUCOSE, CAPILLARY
Glucose-Capillary: 140 mg/dL — ABNORMAL HIGH (ref 70–99)
Glucose-Capillary: 172 mg/dL — ABNORMAL HIGH (ref 70–99)
Glucose-Capillary: 176 mg/dL — ABNORMAL HIGH (ref 70–99)
Glucose-Capillary: 178 mg/dL — ABNORMAL HIGH (ref 70–99)

## 2020-03-10 NOTE — Progress Notes (Signed)
Patient not using CPAP at this time. No unit in room. 

## 2020-03-10 NOTE — Progress Notes (Signed)
PROGRESS NOTE    Connie Shepard  IHK:[id number] DOB: Sep 22, 1965 DOA: 03/09/2020 PCP: Remus Loffler, PA-C   Chief Complaint  Patient presents with  . Shortness of Breath    Brief Narrative:  Connie Shepard is a 54 y.o. female with medical history significant of morbid obesity, depression/anxiety, systolic heart failure (last echo with ejection fraction 30 to 35%), hypertension, medication noncompliance and type 2 diabetes; presented to the emergency department with ongoing shortness of breath and fluid overload.  Patient expressed that her symptoms have been present for the last 3-4 weeks and worsening.  She reports lack of medication compliance, follow-up with outpatient providers and expressed ongoing diet indiscretion.  Patient denies fever, chest pain, nausea, vomiting, dysuria, hematuria, melena, hematochezia, focal neurologic deficits or any other complaints.   Of note, she does not use oxygen supplementation chronically.  ED Course: Massive fluid overload; chest x-ray with vascular congestion and pulmonary edema.  Patient using 2 L nasal cannula supplementation.  Accepted to be admitted for further evaluation and management of acute on chronic systolic heart failure.  IV Lasix initiated in the ED.  Assessment & Plan: 1-acute on chronic systolic HF -continue daily weights and strict I's and O's -continue to follow low sodium diet -continue IV diuresis and metolazone as ordered -will also continue b-blocker and ARB. -follow electrolytes and renal function closely  2-hypertensive crisis -improved and much better controlled currently -continue to follow VS -continue current antihypertensive regimen   3-type 2 diabetes -will continue SSI and Levemir -A1C 8.2  4-hypokalemia -follow electrolytes and further replete as needed  5-depression and anxiety -continue Lexapro and Wellbutrin -no SI or hallucinations   6-morbid obesity -Body mass index is 55.59 kg/m. -low calorie  diet, portion control and increase physical activity discussed with patient. -patient will benefit of outpatient bariatric clinic referall/evaluation.     DVT prophylaxis: heparin Code Status: Full code. Family Communication: no family at bedside; patient will update her family on her own. Disposition:   Status is: Inpatient  Dispo: The patient is from: home               Anticipated d/c is to: home               Anticipated d/c date is: 1-2 days              Patient currently not medically stable for discharge, still with significant fluid overload, complaining of SOB with exertion and expressing orthopnea. Will continue IV diuresis.        Consultants:   None    Procedures:  See below for x-ray reports 2-D echo: limited study due to body habitus; estimated EF 35%.   Antimicrobials:  None    Subjective: Afebrile, still SOB with minimal exertion, expressing orthopnea and with signs of fluid overload.  Objective: Vitals:   03/10/20 0800 03/10/20 0900 03/10/20 1000 03/10/20 1100  BP: (!) 177/114 (!) 156/94 (!) 149/70 (!) 144/83  Pulse: 78 78 92 89  Resp: 13 (!) Temp:      TempSrc:      SpO2: 97% 99% 93% 95%  Weight:      Height:        Intake/Output Summary (Last 24 hours) at 03/10/2020 1131 Last data filed at 03/10/2020 0710 Gross per 24 hour  Intake 240 ml  Output 2000 ml  Net -1760 ml   Filed Weights   03/09/20 1016 03/09/20 2024 03/10/20 0400  Weight: 129.3 kg (!) 146.9  kg (!) 146.9 kg    Examination:  General exam: afebrile, no CP, no palpitations. Still complaining of SOB and orthopnea. No nausea, no vomiting. Positive fluid overload signs. Respiratory system: no wheezing, no using accessory muscles, positive fine crackles bilaterally. Cardiovascular system: S1 & S2 heard, RRR. No JVD, murmurs, rubs, gallops or clicks. 3 ++ edema bilaterally  Gastrointestinal system: Abdomen is obese, increased abd girth appreciated, nontender. No  organomegaly or masses felt. Normal bowel sounds heard. Central nervous system: Alert and oriented. No focal neurological deficits. Extremities: no cyanosis, no clubbing. Skin: No petechiae. Psychiatry: Judgement and insight appear normal. Mood & affect appropriate.    Data Reviewed: I have personally reviewed following labs and imaging studies  CBC: Recent Labs  Lab 03/08/20 1735 03/09/20 1600  WBC 8.7 7.9  NEUTROABS 7.1 5.7  HGB 12.8 13.0  HCT 41.6 41.7  MCV 101.5* 102.0*  PLT 236 228    Basic Metabolic Panel: Recent Labs  Lab 03/08/20 1735 03/09/20 1600 03/10/20 0426  NA 138 141 144  K 3.3* 3.2* 3.6  CL 102 103 102  CO2 26 26 32  GLUCOSE 214* 186* 149*  BUN 10 10 10   CREATININE 1.04* 1.00 1.04*  CALCIUM 9.1 9.2 9.1  MG  --  1.9  --     GFR: Estimated Creatinine Clearance: 91.5 mL/min (A) (by C-G formula based on SCr of 1.04 mg/dL (H)).  Liver Function Tests: Recent Labs  Lab 03/08/20 1735 03/09/20 1600  AST 16 15  ALT 11 9  ALKPHOS 69 67  BILITOT 1.5* 1.2  PROT 6.6 6.9  ALBUMIN 3.2* 3.4*    CBG: Recent Labs  Lab 03/09/20 1705 03/09/20 2141 03/10/20 0906  GLUCAP 151* 146* 140*     Recent Results (from the past 240 hour(s))  Resp panel by RT-PCR (RSV, Flu A&B, Covid) Nasopharyngeal Swab     Status: None   Collection Time: 03/09/20 12:07 PM   Specimen: Nasopharyngeal Swab; Nasopharyngeal(NP) swabs in vial transport medium  Result Value Ref Range Status   SARS Coronavirus 2 by RT PCR NEGATIVE NEGATIVE Final    Comment: (NOTE) SARS-CoV-2 target nucleic acids are NOT DETECTED.  The SARS-CoV-2 RNA is generally detectable in upper respiratory specimens during the acute phase of infection. The lowest concentration of SARS-CoV-2 viral copies this assay can detect is 138 copies/mL. A negative result does not preclude SARS-Cov-2 infection and should not be used as the sole basis for treatment or other patient management decisions. A negative  result may occur with  improper specimen collection/handling, submission of specimen other than nasopharyngeal swab, presence of viral mutation(s) within the areas targeted by this assay, and inadequate number of viral copies(<138 copies/mL). A negative result must be combined with clinical observations, patient history, and epidemiological information. The expected result is Negative.  Fact Sheet for Patients:  BloggerCourse.comhttps://www.fda.gov/media/152166/download  Fact Sheet for Healthcare Providers:  SeriousBroker.ithttps://www.fda.gov/media/152162/download  This test is no t yet approved or cleared by the [place] FDA and  has been authorized for detection and/or diagnosis of SARS-CoV-2 by FDA under an Emergency Use Authorization (EUA). This EUA will remain  in effect (meaning this test can be used) for the duration of the COVID-19 declaration under Section 564(b)(1) of the Act, 21 U.S.C.section 360bbb-3(b)(1), unless the authorization is terminated  or revoked sooner.       Influenza A by PCR NEGATIVE NEGATIVE Final   Influenza B by PCR NEGATIVE NEGATIVE Final    Comment: (NOTE) The Xpert Xpress SARS-CoV-2/FLU/RSV  plus assay is intended as an aid in the diagnosis of influenza from Nasopharyngeal swab specimens and should not be used as a sole basis for treatment. Nasal washings and aspirates are unacceptable for Xpert Xpress SARS-CoV-2/FLU/RSV testing.  Fact Sheet for Patients: BloggerCourse.com  Fact Sheet for Healthcare Providers: SeriousBroker.it  This test is not yet approved or cleared by the [place] FDA and has been authorized for detection and/or diagnosis of SARS-CoV-2 by FDA under an Emergency Use Authorization (EUA). This EUA will remain in effect (meaning this test can be used) for the duration of the COVID-19 declaration under Section 564(b)(1) of the Act, 21 U.S.C. section 360bbb-3(b)(1), unless the authorization is  terminated or revoked.     Resp Syncytial Virus by PCR NEGATIVE NEGATIVE Final    Comment: (NOTE) Fact Sheet for Patients: BloggerCourse.com  Fact Sheet for Healthcare Providers: SeriousBroker.it  This test is not yet approved or cleared by the [place] FDA and has been authorized for detection and/or diagnosis of SARS-CoV-2 by FDA under an Emergency Use Authorization (EUA). This EUA will remain in effect (meaning this test can be used) for the duration of the COVID-19 declaration under Section 564(b)(1) of the Act, 21 U.S.C. section 360bbb-3(b)(1), unless the authorization is terminated or revoked.  Performed at [facility], [address], [place], [place] [address]   MRSA PCR Screening     Status: None   Collection Time: 03/09/20  7:53 PM   Specimen: Nasal Mucosa; Nasopharyngeal  Result Value Ref Range Status   MRSA by PCR NEGATIVE NEGATIVE Final    Comment:        The GeneXpert MRSA Assay (FDA approved for NASAL specimens only), is one component of a comprehensive MRSA colonization surveillance program. It is not intended to diagnose MRSA infection nor to guide or monitor treatment for MRSA infections. Performed at [facility], [address] [place], [place] [address]      Radiology Studies: [facility] Chest [facility] 1 View  Result Date: 03/09/2020 CLINICAL DATA:  Weakness EXAM: PORTABLE CHEST 1 VIEW COMPARISON:  03/08/2020 FINDINGS: Suboptimal evaluation due to portable technique, body habitus, and low lung volumes. Similar lung aeration with probable pulmonary vascular congestion and possible mild superimposed edema. Similar cardiomegaly. IMPRESSION: Similar lung aeration with probable pulmonary vascular congestion and possible mild superimposed edema. Cardiomegaly. Electronically Signed   By: Guadlupe Spanish M.D.   On: 03/09/2020 10:50   DG Chest Port 1 View  Result Date: 03/08/2020 CLINICAL DATA:  Shortness of breath  for several weeks, chronic edema EXAM: PORTABLE CHEST 1 VIEW COMPARISON:  Portable exam 1756 hours compared to 04/04/2018 FINDINGS: Enlargement of cardiac silhouette with pulmonary vascular congestion. Interstitial infiltrate likely pulmonary edema and CHF. No gross pleural effusion or pneumothorax. Osseous structures unremarkable. IMPRESSION: Enlargement of cardiac silhouette with pulmonary vascular congestion and probable mild pulmonary edema. Electronically Signed   By: Ulyses Southward M.D.   On: 03/08/2020 18:26   ECHOCARDIOGRAM COMPLETE  Result Date: 03/09/2020    ECHOCARDIOGRAM REPORT   Patient Name:   CHRISTEEN LAI Date of Exam: 03/09/2020 Medical Rec #:  [id number]    Height:       64.0 in Accession #:    [id number]   Weight:       285.0 lb Date of Birth:  11/04/1965   BSA:          2.274 m Patient Age:    53 years     BP:  195/144 mmHg Patient Gender: F            HR:           97 bpm. [facility] Procedure: 2D Echo Indications:    CHF-Acute Systolic 428.21 / I50.21  History:        Patient has prior history of Echocardiogram examinations, most                 recent 04/04/2018. CHF; Risk Factors:Hypertension and                 Non-Smoker. Elevated Troponin.  Sonographer:    Jeryl Columbia RDCS (AE) Referring Phys: [id number] Jais Demir IMPRESSIONS  1. Very limited images.  2. Left ventricular ejection fraction, by estimation, is approximately 35%. The left ventricle has moderately decreased function. Left ventricular endocardial border not optimally defined to evaluate regional wall motion even with Definity contrast. There is mild left ventricular hypertrophy. Left ventricular diastolic parameters are indeterminate.  3. Right ventricular systolic function is severely reduced. The right ventricular size is normal.  4. The mitral valve is grossly normal. Trivial mitral valve regurgitation.  5. The aortic valve is tricuspid. There is mild calcification of the aortic valve. Aortic valve  regurgitation is not visualized.  6. Unable to estimate CVP. FINDINGS  Left Ventricle: Left ventricular ejection fraction, by estimation, is 35%. The left ventricle has moderately decreased function. Left ventricular endocardial border not optimally defined to evaluate regional wall motion. Definity contrast agent was given IV to delineate the left ventricular endocardial borders. The left ventricular internal cavity size was normal in size. There is mild left ventricular hypertrophy. Left ventricular diastolic parameters are indeterminate. Right Ventricle: The right ventricular size is normal. No increase in right ventricular wall thickness. Right ventricular systolic function is severely reduced. Left Atrium: Left atrial size was normal in size. Right Atrium: Right atrial size was normal in size. Pericardium: There is no evidence of pericardial effusion. Mitral Valve: The mitral valve is grossly normal. Mild mitral annular calcification. Trivial mitral valve regurgitation. Tricuspid Valve: The tricuspid valve is grossly normal. Tricuspid valve regurgitation is mild. Aortic Valve: The aortic valve is tricuspid. There is mild calcification of the aortic valve. Aortic valve regurgitation is not visualized. Pulmonic Valve: The pulmonic valve was grossly normal. Pulmonic valve regurgitation is trivial. Aorta: The aortic root is normal in size and structure. Venous: Unable to estimate CVP. The inferior vena cava was not well visualized. IAS/Shunts: The interatrial septum was not well visualized.  LEFT VENTRICLE PLAX 2D LVIDd:         4.89 cm  Diastology LVIDs:         4.04 cm  LV e' medial:    4.56 cm/s LV PW:         1.73 cm  LV E/e' medial:  16.6 LV IVS:        1.16 cm  LV e' lateral:   6.31 cm/s LVOT diam:     1.90 cm  LV E/e' lateral: 12.0 LVOT Area:     2.84 cm  RIGHT VENTRICLE RV S prime:     7.10 cm/s TAPSE (M-mode): 1.7 cm LEFT ATRIUM           Index       RIGHT ATRIUM           Index LA diam:      4.80 cm 2.11  cm/m  RA Area:     15.70 cm LA Vol (  A4C): 71.2 ml 31.31 ml/m RA Volume:   47.20 ml  20.75 ml/m   AORTA Ao Root diam: 3.00 cm MITRAL VALVE               TRICUSPID VALVE MV Area (PHT): 3.46 cm    TR Peak grad:   38.4 mmHg MV Decel Time: 219 msec    TR Vmax:        310.00 cm/s MV E velocity: 75.80 cm/s                            SHUNTS                            Systemic Diam: 1.90 cm Nona Dell MD Electronically signed by Nona Dell MD Signature Date/Time: 03/09/2020/4:09:39 PM    Final     Scheduled Meds: . buPROPion  150 mg Oral Daily  . carvedilol  6.25 mg Oral BID WC  . Chlorhexidine Gluconate Cloth  6 each Topical Daily  . escitalopram  20 mg Oral Daily  . furosemide  60 mg Intravenous Q12H  . heparin injection (subcutaneous)  5,000 Units Subcutaneous Q8H  . insulin aspart  0-15 Units Subcutaneous TID WC  . insulin detemir  5 Units Subcutaneous QHS  . isosorbide-hydrALAZINE  1 tablet Oral BID  . losartan  50 mg Oral Daily  . metolazone  2.5 mg Oral Daily  . potassium chloride  30 mEq Oral BID  . sodium chloride flush  3 mL Intravenous Q12H   Continuous Infusions: . sodium chloride       LOS: 1 day    Time spent: 30 minutes    Vassie Loll, MD [facility]   To contact the attending provider between 7A-7P or the covering provider during after hours 7P-7A, please log into the web site [email or web address] and access using universal [place] password for that web site. If you do not have the password, please call the hospital operator.  03/10/2020, 11:31 AM

## 2020-03-10 NOTE — Progress Notes (Signed)
Report given to 300 unit.  Patient take to room via wheelchair.

## 2020-03-11 ENCOUNTER — Other Ambulatory Visit: Payer: Self-pay

## 2020-03-11 ENCOUNTER — Inpatient Hospital Stay (HOSPITAL_COMMUNITY): Payer: 59

## 2020-03-11 DIAGNOSIS — R601 Generalized edema: Secondary | ICD-10-CM | POA: Diagnosis not present

## 2020-03-11 DIAGNOSIS — I169 Hypertensive crisis, unspecified: Secondary | ICD-10-CM | POA: Diagnosis not present

## 2020-03-11 DIAGNOSIS — I5023 Acute on chronic systolic (congestive) heart failure: Secondary | ICD-10-CM | POA: Diagnosis not present

## 2020-03-11 LAB — BASIC METABOLIC PANEL
Anion gap: 12 (ref 5–15)
BUN: 14 mg/dL (ref 6–20)
CO2: 32 mmol/L (ref 22–32)
Calcium: 9.2 mg/dL (ref 8.9–10.3)
Chloride: 93 mmol/L — ABNORMAL LOW (ref 98–111)
Creatinine, Ser: 1.24 mg/dL — ABNORMAL HIGH (ref 0.44–1.00)
GFR, Estimated: 52 mL/min — ABNORMAL LOW (ref >60)
Glucose, Bld: 209 mg/dL — ABNORMAL HIGH (ref 70–99)
Potassium: 2.6 mmol/L — CL (ref 3.5–5.1)
Sodium: 137 mmol/L (ref 135–145)

## 2020-03-11 LAB — GLUCOSE, CAPILLARY
Glucose-Capillary: 201 mg/dL — ABNORMAL HIGH (ref 70–99)
Glucose-Capillary: 242 mg/dL — ABNORMAL HIGH (ref 70–99)
Glucose-Capillary: 215 mg/dL — ABNORMAL HIGH (ref 70–99)
Glucose-Capillary: 219 mg/dL — ABNORMAL HIGH (ref 70–99)
Glucose-Capillary: 190 mg/dL — ABNORMAL HIGH (ref 70–99)

## 2020-03-11 IMAGING — CT CT HEAD W/O CM
3 series · 16 of 47 positions shown, 19 images · non-contrast
Comparison: CT head [DATE]

CLINICAL DATA: Neuro deficit, stroke suspected. Weakness
enlargement of fluid to legs in abdomen.

EXAM:
CT HEAD WITHOUT CONTRAST
TECHNIQUE: Contiguous axial images were obtained from the base of the skull
through the vertex without intravenous contrast.

[Series 2: head w o · axial · 0.42mm/px · z∈[+26,+161]mm · 10 of 33 slices shown, 13 images]
[im 3/33  brain]
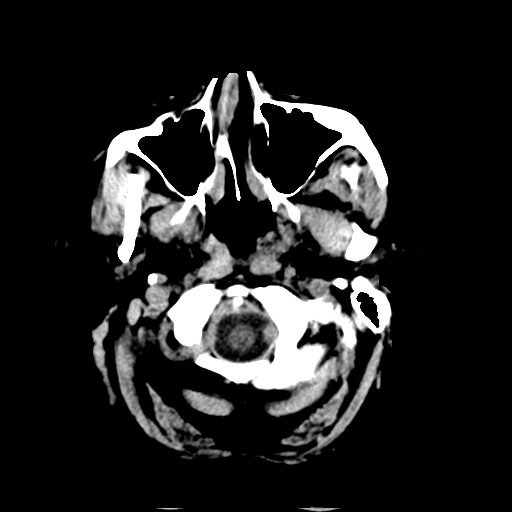
[im 3/33  bone]
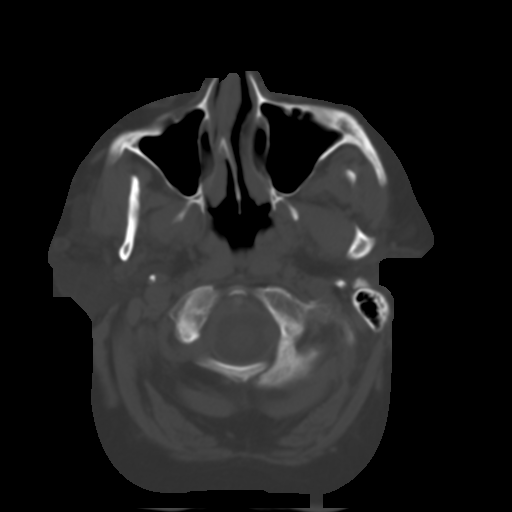
[im 6/33  brain]
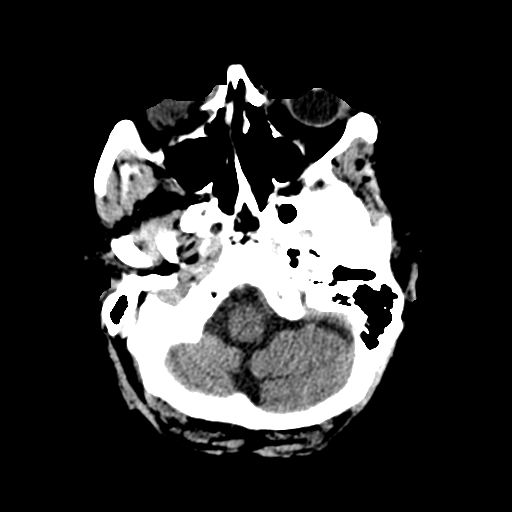
[im 9/33  brain]
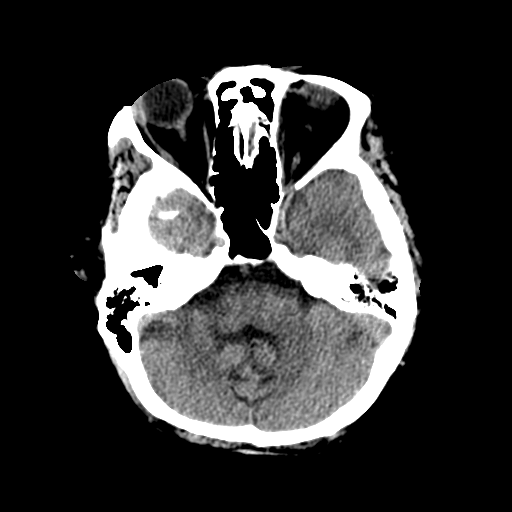
[im 12/33  brain]
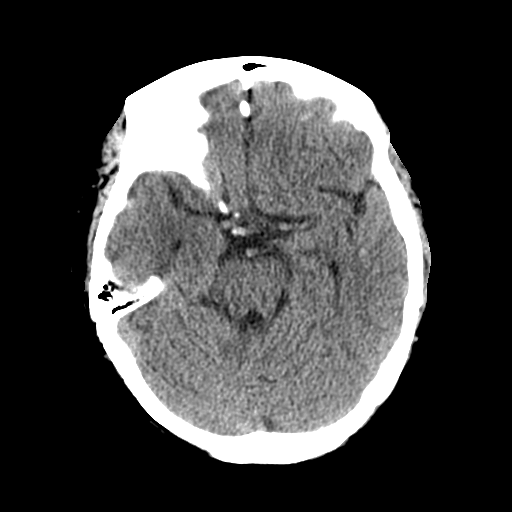
[im 15/33  brain]
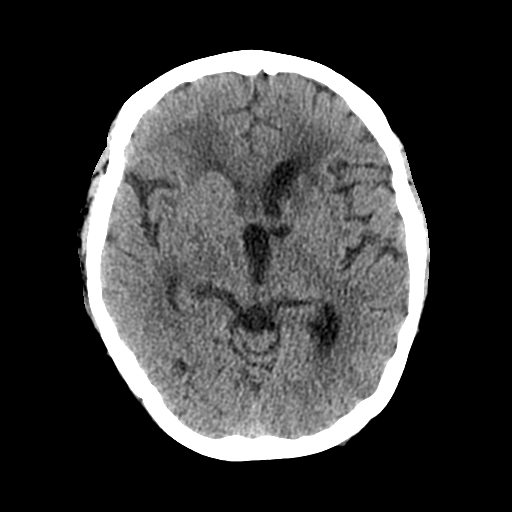
[im 15/33  bone]
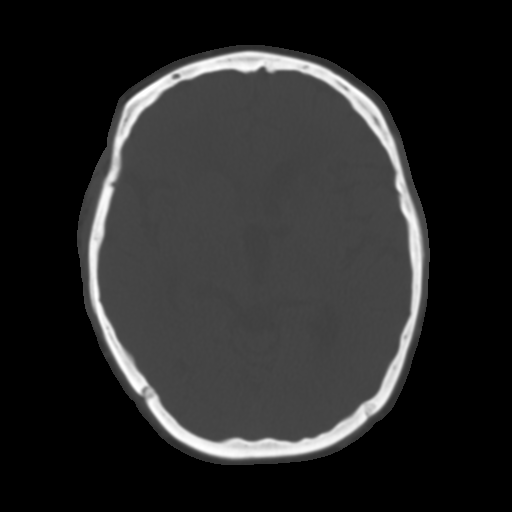
[im 18/33  brain]
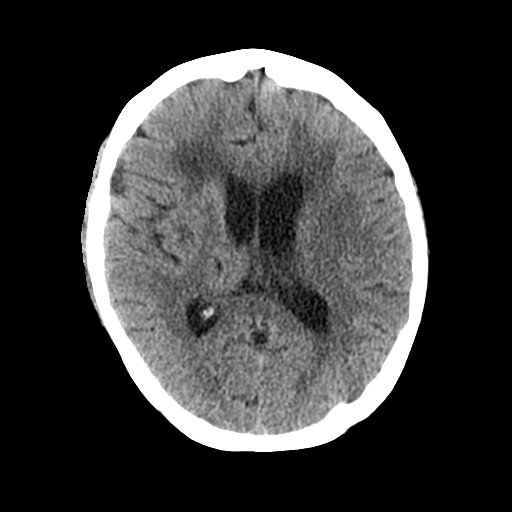
[im 21/33  brain]
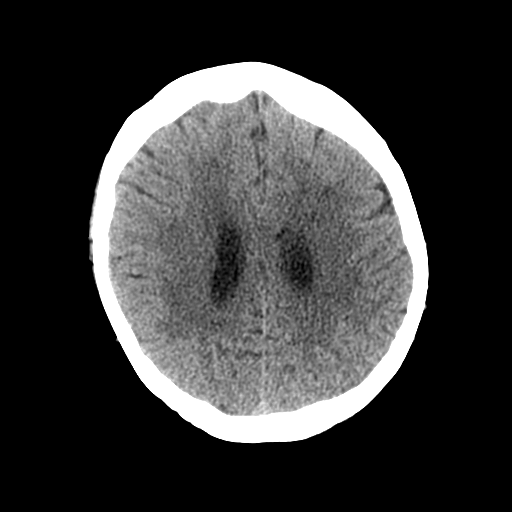
[im 25/33  brain]
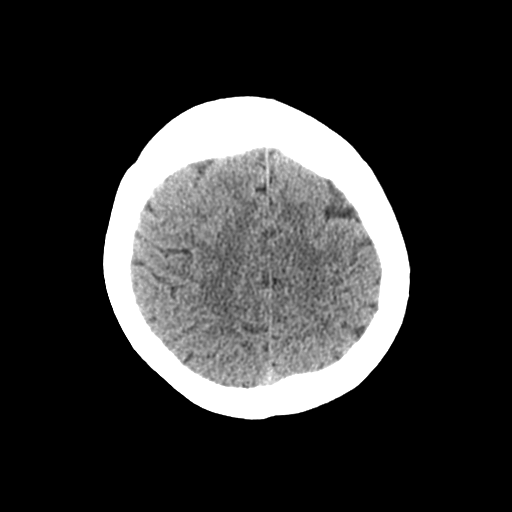
[im 27/33  brain]
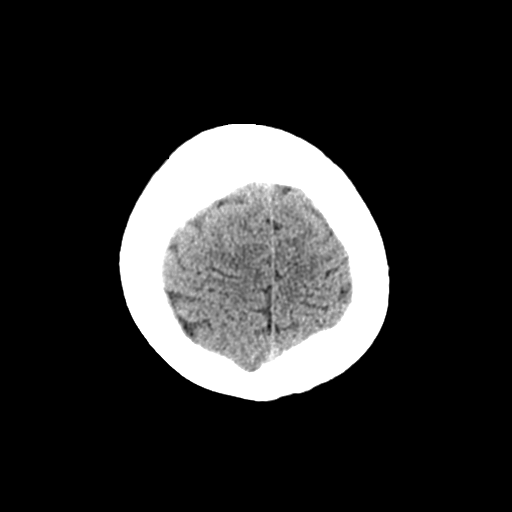
[im 27/33  bone]
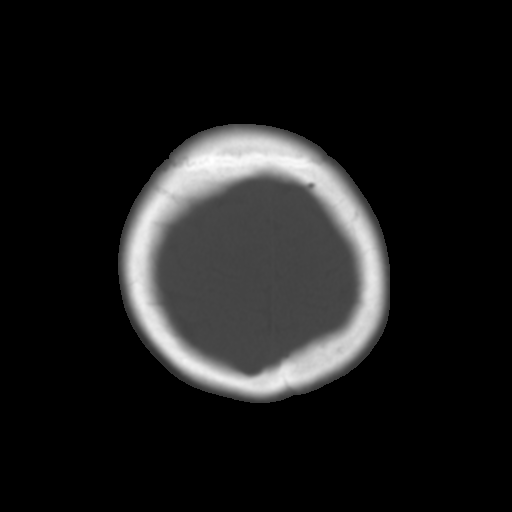
[im 30/33  brain]
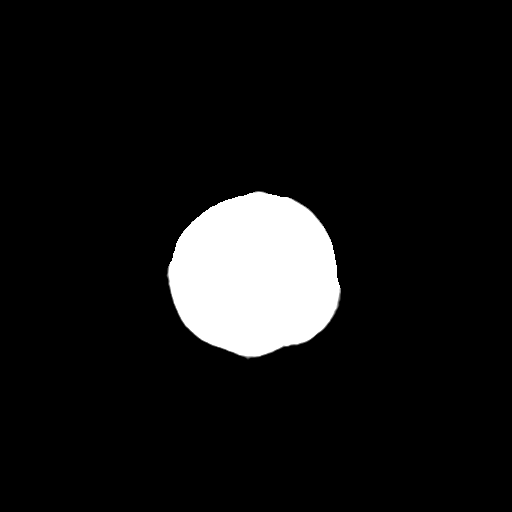

[Series 4: coronal soft · coronal · 0.33mm/px · 3 of 64 slices shown]
[im 22/64  brain]
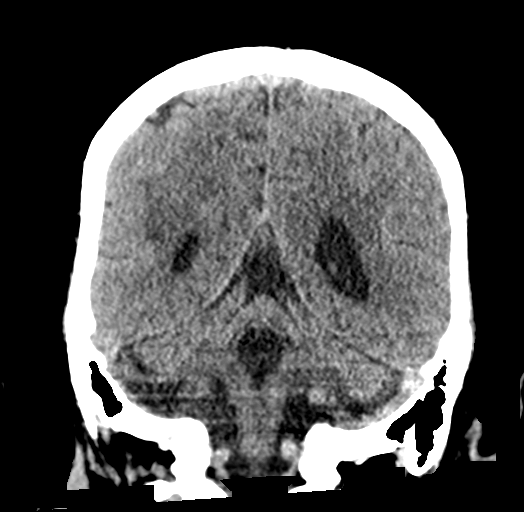
[im 29/64  brain]
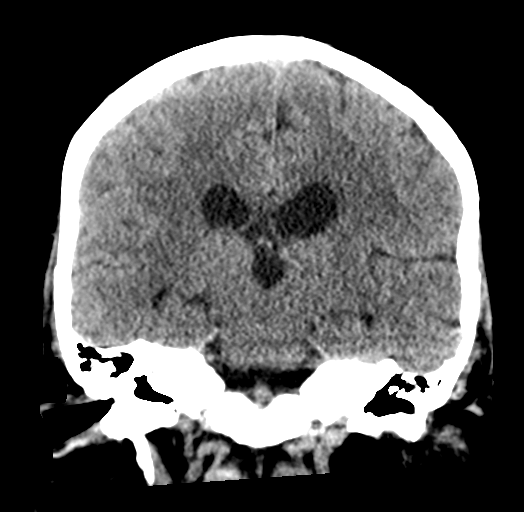
[im 36/64  brain]
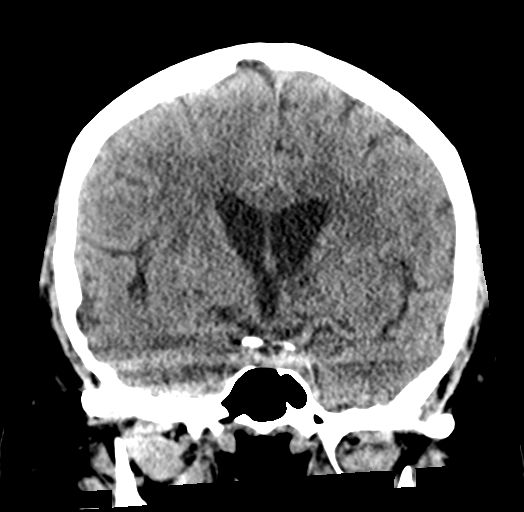

[Series 5: sagittal soft · sagittal · 0.34mm/px · 3 of 62 slices shown]
[im 21/62  brain]
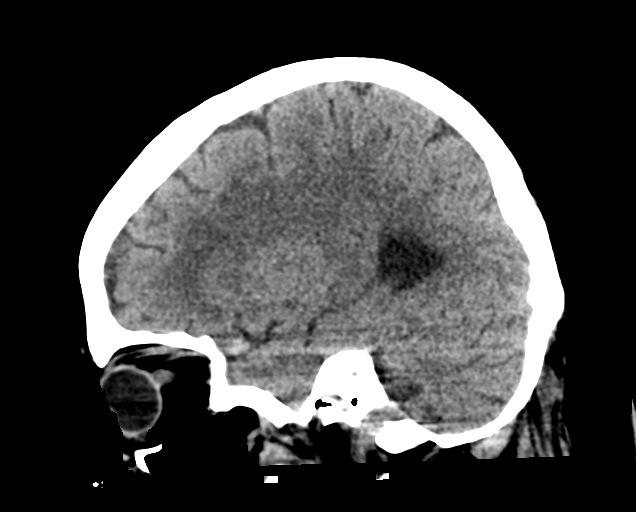
[im 31/62  brain]
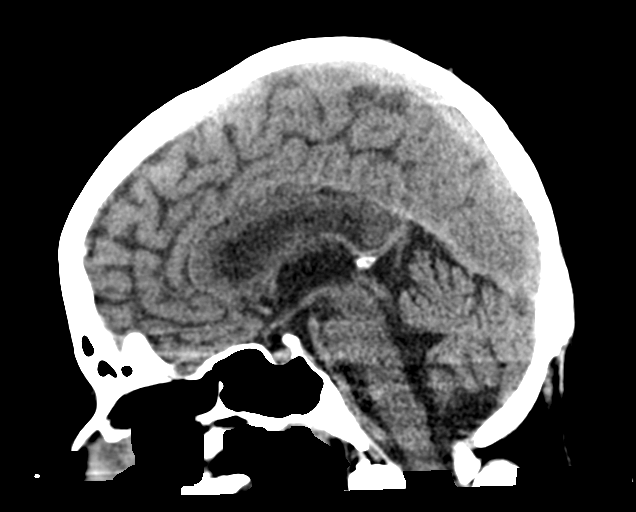
[im 41/62  brain]
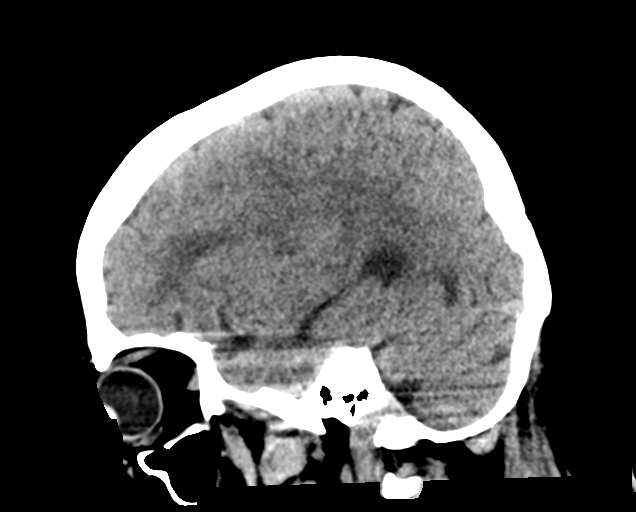

[16 of 47 positions shown; findings below may reference images not displayed]

FINDINGS: Brain:

Patchy and confluent areas of decreased attenuation are noted
throughout the deep and periventricular white matter of the cerebral
hemispheres bilaterally, compatible with chronic microvascular
ischemic disease. Prior left caudate lacunar infarction.

No evidence of large-territorial acute infarction. No parenchymal
hemorrhage. No mass lesion. No extra-axial collection.

No mass effect or midline shift. No hydrocephalus. Basilar cisterns
are patent.

Vascular: No hyperdense vessel.

Skull: No acute fracture or focal lesion.

Sinuses/Orbits: Paranasal sinuses and mastoid air cells are clear.
The orbits are unremarkable.

Other: None.
IMPRESSION: No acute intracranial abnormality.

## 2020-03-11 MED ORDER — LEVOTHYROXINE SODIUM 50 MCG PO TABS
50.0000 ug | ORAL_TABLET | Freq: Every day | ORAL | Status: DC
  Administered 2020-03-12 – 2020-03-14 (×3): 50 ug via ORAL
  Filled 2020-03-11 (×3): qty 1

## 2020-03-11 MED ORDER — INSULIN DETEMIR 100 UNIT/ML ~~LOC~~ SOLN
7.0000 [IU] | Freq: Every day | SUBCUTANEOUS | Status: DC
  Administered 2020-03-11 – 2020-03-13 (×3): 7 [IU] via SUBCUTANEOUS
  Filled 2020-03-11 (×4): qty 0.07

## 2020-03-11 MED ORDER — POTASSIUM CHLORIDE CRYS ER 20 MEQ PO TBCR
40.0000 meq | EXTENDED_RELEASE_TABLET | Freq: Once | ORAL | Status: AC
  Administered 2020-03-11: 40 meq via ORAL
  Filled 2020-03-11: qty 2

## 2020-03-11 MED ORDER — POTASSIUM CHLORIDE CRYS ER 20 MEQ PO TBCR
40.0000 meq | EXTENDED_RELEASE_TABLET | Freq: Two times a day (BID) | ORAL | Status: DC
  Administered 2020-03-11 (×2): 40 meq via ORAL
  Filled 2020-03-11 (×2): qty 2

## 2020-03-11 NOTE — Progress Notes (Signed)
Mother said that patient is not herself in that she is mean to her and other family members and seems weaker than normal and was concerned that patient may have had a stroke. No stroke symptoms noted but this information passed on to Dr. Gwenlyn [facility] and he spoke with patient and mother and ordered PT and CT scan.

## 2020-03-11 NOTE — Progress Notes (Signed)
PROGRESS NOTE    Connie Shepard  VWU:[id number] DOB: [birth year] DOA: 03/09/2020 PCP: Remus LofflerJones, Angel S, PA-C   Chief Complaint  Patient presents with  . Shortness of Breath    Brief Narrative:  Connie Shepard is a 54 y.o. female with medical history significant of morbid obesity, depression/anxiety, systolic heart failure (last echo with ejection fraction 30 to 35%), hypertension, medication noncompliance and type 2 diabetes; presented to the emergency department with ongoing shortness of breath and fluid overload.  Patient expressed that her symptoms have been present for the last 3-4 weeks and worsening.  She reports lack of medication compliance, follow-up with outpatient providers and expressed ongoing diet indiscretion.  Patient denies fever, chest pain, nausea, vomiting, dysuria, hematuria, melena, hematochezia, focal neurologic deficits or any other complaints.   Of note, she does not use oxygen supplementation chronically.  ED Course: Massive fluid overload; chest x-ray with vascular congestion and pulmonary edema.  Patient using 2 L nasal cannula supplementation.  Accepted to be admitted for further evaluation and management of acute on chronic systolic heart failure.  IV Lasix initiated in the ED.  Assessment & Plan: 1-acute on chronic systolic HF -continue daily weights and strict I's and O's -continue to follow low sodium diet -continue IV diuresis and metolazone as ordered -will also continue b-blocker and ARB. -follow electrolytes and renal function closely  2-hypertensive crisis -improved and much better controlled currently -continue to follow VS -continue current antihypertensive regimen   3-type 2 diabetes -will continue SSI and adjusted dose of Levemir -A1C 8.2 demonstrating poor diabetes management.  4-hypokalemia -follow electrolytes and further replete as needed  5-depression and anxiety -continue Lexapro and Wellbutrin -no SI or hallucinations   6-morbid  obesity -Body mass index is 53.9 kg/m. -low calorie diet, portion control and increase physical activity discussed with patient. -patient will benefit of outpatient bariatric clinic referall/evaluation.  7-poor balance/mood changes -CT head without contrast will be ordered to rule out any abnormalities. -Will check B12 and vitamin D  8-hypothyroidism -Synthroid 50 mcg daily started -Repeat thyroid panel in 3 months.  9-hypokalemia -In the setting of acute diuresis -Continue electrolyte repletion and supplementation -Follow trend -Continue telemetry monitoring.  DVT prophylaxis: heparin Code Status: Full code. Family Communication: no family at bedside; patient will update her family on her own. Disposition:   Status is: Inpatient  Dispo: The patient is from: home               Anticipated d/c is to: home               Anticipated d/c date is: 1-2 days              Patient currently not medically stable for discharge, still with significant fluid overload, complaining of SOB with exertion and expressing orthopnea. Will continue IV diuresis.        Consultants:   None    Procedures:  See below for x-ray reports 2-D echo: limited study due to body habitus; estimated EF 35%.   Antimicrobials:  None    Subjective: No fever, no chest pain, no nausea or vomiting.  Reports breathing continued to improve.  Still feeling weak, tired, deconditioned and off balance.  Per mother at bedside they have been also changes in patient's mentation and mood.  Objective: Vitals:   03/11/20 0019 03/11/20 0447 03/11/20 0632 03/11/20 1400  BP: (!) 166/119 (!) 197/119 (!) 200/128 (!) 148/89  Pulse: 86 91 90 86  Resp: 20 20 20  20  Temp: 99 F (37.2 C) 97.7 F (36.5 C) 98.2 F (36.8 C) 98.1 F (36.7 C)  TempSrc: Oral  Oral Oral  SpO2: 95% 96% 98% 95%  Weight:   (!) 142.4 kg   Height:        Intake/Output Summary (Last 24 hours) at 03/11/2020 1703 Last data filed at  03/11/2020 1402 Gross per 24 hour  Intake 1080 ml  Output 2200 ml  Net -1120 ml   Filed Weights   03/09/20 2024 03/10/20 0400 03/11/20 0277  Weight: (!) 146.9 kg (!) 146.9 kg (!) 142.4 kg    Examination: General exam: Alert, awake, oriented x 3; no chest pain, no nausea, no vomiting.  Patient reports breathing is much better and is not requiring any oxygen supplementation currently.  Still with crackles on examination and swelling in her lower extremities up to her thighs, with positive increased abdominal girth. Respiratory system: Positive fine crackles; no wheezing, no using accessory muscles, no tachypnea. Cardiovascular system:RRR. No murmurs, rubs, gallops.  Unable to properly assess for JVD with body habitus. Gastrointestinal system: Abdomen is obese, increased abdominal girth appreciated on examination; no guarding, positive bowel sounds.   Central nervous system: Alert and oriented. No focal neurological deficits. Extremities: No cyanosis or clubbing; 2-3+ edema bilaterally appreciated. Skin: No petechiae. Psychiatry: Judgement and insight appear normal. Mood & affect appropriate.    Data Reviewed: I have personally reviewed following labs and imaging studies  CBC: Recent Labs  Lab 03/08/20 1735 03/09/20 1600  WBC 8.7 7.9  NEUTROABS 7.1 5.7  HGB 12.8 13.0  HCT 41.6 41.7  MCV 101.5* 102.0*  PLT 236 228    Basic Metabolic Panel: Recent Labs  Lab 03/08/20 1735 03/09/20 1600 03/10/20 0426 03/11/20 0604  NA 138 141 144 137  K 3.3* 3.2* 3.6 2.6*  CL 102 103 102 93*  CO2 26 26 32 32  GLUCOSE 214* 186* 149* 209*  BUN 10 10 10 14   CREATININE 1.04* 1.00 1.04* 1.24*  CALCIUM 9.1 9.2 9.1 9.2  MG  --  1.9  --   --     GFR: Estimated Creatinine Clearance: 75.2 mL/min (A) (by C-G formula based on SCr of 1.24 mg/dL (H)).  Liver Function Tests: Recent Labs  Lab 03/08/20 1735 03/09/20 1600  AST 16 15  ALT 11 9  ALKPHOS 69 67  BILITOT 1.5* 1.2  PROT 6.6 6.9    ALBUMIN 3.2* 3.4*    CBG: Recent Labs  Lab 03/10/20 1152 03/10/20 1710 03/10/20 2030 03/11/20 0738 03/11/20 1131  GLUCAP 172* 176* 178* 201* 242*     Recent Results (from the past 240 hour(s))  Resp panel by RT-PCR (RSV, Flu A&B, Covid) Nasopharyngeal Swab     Status: None   Collection Time: 03/09/20 12:07 PM   Specimen: Nasopharyngeal Swab; Nasopharyngeal(NP) swabs in vial transport medium  Result Value Ref Range Status   SARS Coronavirus 2 by RT PCR NEGATIVE NEGATIVE Final    Comment: (NOTE) SARS-CoV-2 target nucleic acids are NOT DETECTED.  The SARS-CoV-2 RNA is generally detectable in upper respiratory specimens during the acute phase of infection. The lowest concentration of SARS-CoV-2 viral copies this assay can detect is 138 copies/mL. A negative result does not preclude SARS-Cov-2 infection and should not be used as the sole basis for treatment or other patient management decisions. A negative result may occur with  improper specimen collection/handling, submission of specimen other than nasopharyngeal swab, presence of viral mutation(s) within the areas targeted by this assay,  and inadequate number of viral copies(<138 copies/mL). A negative result must be combined with clinical observations, patient history, and epidemiological information. The expected result is Negative.  Fact Sheet for Patients:  BloggerCourse.com  Fact Sheet for Healthcare Providers:  SeriousBroker.it  This test is no t yet approved or cleared by the [place] FDA and  has been authorized for detection and/or diagnosis of SARS-CoV-2 by FDA under an Emergency Use Authorization (EUA). This EUA will remain  in effect (meaning this test can be used) for the duration of the COVID-19 declaration under Section 564(b)(1) of the Act, 21 U.S.C.section 360bbb-3(b)(1), unless the authorization is terminated  or revoked sooner.        Influenza A by PCR NEGATIVE NEGATIVE Final   Influenza B by PCR NEGATIVE NEGATIVE Final    Comment: (NOTE) The Xpert Xpress SARS-CoV-2/FLU/RSV plus assay is intended as an aid in the diagnosis of influenza from Nasopharyngeal swab specimens and should not be used as a sole basis for treatment. Nasal washings and aspirates are unacceptable for Xpert Xpress SARS-CoV-2/FLU/RSV testing.  Fact Sheet for Patients: BloggerCourse.com  Fact Sheet for Healthcare Providers: SeriousBroker.it  This test is not yet approved or cleared by the [place] FDA and has been authorized for detection and/or diagnosis of SARS-CoV-2 by FDA under an Emergency Use Authorization (EUA). This EUA will remain in effect (meaning this test can be used) for the duration of the COVID-19 declaration under Section 564(b)(1) of the Act, 21 U.S.C. section 360bbb-3(b)(1), unless the authorization is terminated or revoked.     Resp Syncytial Virus by PCR NEGATIVE NEGATIVE Final    Comment: (NOTE) Fact Sheet for Patients: BloggerCourse.com  Fact Sheet for Healthcare Providers: SeriousBroker.it  This test is not yet approved or cleared by the [place] FDA and has been authorized for detection and/or diagnosis of SARS-CoV-2 by FDA under an Emergency Use Authorization (EUA). This EUA will remain in effect (meaning this test can be used) for the duration of the COVID-19 declaration under Section 564(b)(1) of the Act, 21 U.S.C. section 360bbb-3(b)(1), unless the authorization is terminated or revoked.  Performed at [facility], [address] [place], [place] [address]   MRSA PCR Screening     Status: None   Collection Time: 03/09/20  7:53 PM   Specimen: Nasal Mucosa; Nasopharyngeal  Result Value Ref Range Status   MRSA by PCR NEGATIVE NEGATIVE Final    Comment:        The GeneXpert MRSA Assay (FDA approved  for NASAL specimens only), is one component of a comprehensive MRSA colonization surveillance program. It is not intended to diagnose MRSA infection nor to guide or monitor treatment for MRSA infections. Performed at [facility], [address], [place], [place] [address]      Radiology Studies: No results found.  Scheduled Meds: . buPROPion  150 mg Oral Daily  . carvedilol  6.25 mg Oral BID WC  . Chlorhexidine Gluconate Cloth  6 each Topical Daily  . escitalopram  20 mg Oral Daily  . furosemide  60 mg Intravenous Q12H  . heparin injection (subcutaneous)  5,000 Units Subcutaneous Q8H  . insulin aspart  0-15 Units Subcutaneous TID WC  . insulin detemir  7 Units Subcutaneous QHS  . isosorbide-hydrALAZINE  1 tablet Oral BID  . [START ON 03/12/2020] levothyroxine  50 mcg Oral Q0600  . losartan  50 mg Oral Daily  . metolazone  2.5 mg Oral Daily  . potassium chloride  40 mEq Oral BID  .  sodium chloride flush  3 mL Intravenous Q12H   Continuous Infusions: . sodium chloride       LOS: 2 days    Time spent: 30 minutes    Vassie Loll, MD [facility]   To contact the attending provider between 7A-7P or the covering provider during after hours 7P-7A, please log into the web site [email or web address] and access using universal [place] password for that web site. If you do not have the password, please call the hospital operator.  03/11/2020, 5:03 PM

## 2020-03-12 DIAGNOSIS — I5023 Acute on chronic systolic (congestive) heart failure: Secondary | ICD-10-CM | POA: Diagnosis not present

## 2020-03-12 DIAGNOSIS — I169 Hypertensive crisis, unspecified: Secondary | ICD-10-CM | POA: Diagnosis not present

## 2020-03-12 DIAGNOSIS — R601 Generalized edema: Secondary | ICD-10-CM | POA: Diagnosis not present

## 2020-03-12 LAB — BASIC METABOLIC PANEL
Anion gap: 12 (ref 5–15)
BUN: 14 mg/dL (ref 6–20)
CO2: 34 mmol/L — ABNORMAL HIGH (ref 22–32)
Calcium: 9.9 mg/dL (ref 8.9–10.3)
Chloride: 91 mmol/L — ABNORMAL LOW (ref 98–111)
Creatinine, Ser: 1.23 mg/dL — ABNORMAL HIGH (ref 0.44–1.00)
GFR, Estimated: 53 mL/min — ABNORMAL LOW (ref >60)
Glucose, Bld: 198 mg/dL — ABNORMAL HIGH (ref 70–99)
Potassium: 2.7 mmol/L — CL (ref 3.5–5.1)
Sodium: 137 mmol/L (ref 135–145)

## 2020-03-12 LAB — GLUCOSE, CAPILLARY
Glucose-Capillary: 178 mg/dL — ABNORMAL HIGH (ref 70–99)
Glucose-Capillary: 200 mg/dL — ABNORMAL HIGH (ref 70–99)
Glucose-Capillary: 191 mg/dL — ABNORMAL HIGH (ref 70–99)

## 2020-03-12 LAB — VITAMIN B12: Vitamin B-12: 266 pg/mL (ref 180–914)

## 2020-03-12 LAB — VITAMIN D 25 HYDROXY (VIT D DEFICIENCY, FRACTURES): Vit D, 25-Hydroxy: 16.31 ng/mL — ABNORMAL LOW (ref 30–100)

## 2020-03-12 MED ORDER — SALINE SPRAY 0.65 % NA SOLN
1.0000 | NASAL | Status: DC | PRN
  Administered 2020-03-12: 1 via NASAL
  Filled 2020-03-12: qty 44

## 2020-03-12 MED ORDER — POTASSIUM CHLORIDE CRYS ER 20 MEQ PO TBCR
40.0000 meq | EXTENDED_RELEASE_TABLET | Freq: Three times a day (TID) | ORAL | Status: DC
  Administered 2020-03-12 – 2020-03-14 (×7): 40 meq via ORAL
  Filled 2020-03-12 (×8): qty 2

## 2020-03-12 MED ORDER — MAGNESIUM SULFATE 2 GM/50ML IV SOLN
2.0000 g | Freq: Once | INTRAVENOUS | Status: AC
  Administered 2020-03-12: 2 g via INTRAVENOUS
  Filled 2020-03-12: qty 50

## 2020-03-12 MED ORDER — POTASSIUM CHLORIDE 10 MEQ/100ML IV SOLN
10.0000 meq | INTRAVENOUS | Status: AC
  Administered 2020-03-12 (×2): 10 meq via INTRAVENOUS
  Filled 2020-03-12 (×2): qty 100

## 2020-03-12 NOTE — Progress Notes (Signed)
PROGRESS NOTE    Connie Shepard  QMG:[id number] DOB: November 08, 1965 DOA: 03/09/2020 PCP: Remus Loffler, PA-C   Chief Complaint  Patient presents with  . Shortness of Breath    Brief Narrative:  Connie Shepard is a 54 y.o. female with medical history significant of morbid obesity, depression/anxiety, systolic heart failure (last echo with ejection fraction 30 to 35%), hypertension, medication noncompliance and type 2 diabetes; presented to the emergency department with ongoing shortness of breath and fluid overload.  Patient expressed that her symptoms have been present for the last 3-4 weeks and worsening.  She reports lack of medication compliance, follow-up with outpatient providers and expressed ongoing diet indiscretion.  Patient denies fever, chest pain, nausea, vomiting, dysuria, hematuria, melena, hematochezia, focal neurologic deficits or any other complaints.   Of note, she does not use oxygen supplementation chronically.  ED Course: Massive fluid overload; chest x-ray with vascular congestion and pulmonary edema.  Patient using 2 L nasal cannula supplementation.  Accepted to be admitted for further evaluation and management of acute on chronic systolic heart failure.  IV Lasix initiated in the ED.  Assessment & Plan: 1-acute on chronic systolic HF -continue daily weights and strict I's and O's -continue to follow low sodium diet -continue IV diuresis and metolazone as ordered -will also continue b-blocker and ARB. -follow electrolytes and renal function closely  2-hypertensive crisis -improved and much better controlled currently -continue to follow VS -continue current antihypertensive regimen   3-type 2 diabetes -will continue SSI and adjusted dose of Levemir -A1C 8.2 demonstrating poor diabetes management.  4-hypokalemia -follow electrolytes and further replete as needed  5-depression and anxiety -continue Lexapro and Wellbutrin -no SI or hallucinations   6-morbid  obesity -Body mass index is 53.92 kg/m. -low calorie diet, portion control and increase physical activity discussed with patient. -patient will benefit of outpatient bariatric clinic referall/evaluation.  7-poor balance/mood changes -CT head without contrast will be ordered to rule out any abnormalities. -Will check B12 and vitamin D - physical therapy evaluation requested.  8-hypothyroidism -Continue Synthroid 50 mcg daily -Repeat thyroid panel in 3 months.  9-hypokalemia -In the setting of acute diuresis -K 2.3 -Will continue electrolyte repletion and supplementation -Follow trend -Continue telemetry monitoring.  DVT prophylaxis: heparin Code Status: Full code. Family Communication: Mother at bedside. Disposition:   Status is: Inpatient  Dispo: The patient is from: home               Anticipated d/c is to: home               Anticipated d/c date is: 1-2 days              Patient currently not medically stable for discharge, still with significant fluid overload, complaining of SOB with exertion and expressing orthopnea. Will continue IV diuresis and continue electrolyte repletion..      Consultants:   None    Procedures:  See below for x-ray reports 2-D echo: limited study due to body habitus; estimated EF 35%.   Antimicrobials:  None    Subjective: Still having shortness of breath with activity and demonstrating significant signs of fluid overload on examination.  Denies chest pain, no nausea, no vomiting.  Patient reports feeling weak and tired.  Objective: Vitals:   03/12/20 0301 03/12/20 0548 03/12/20 0919 03/12/20 1341  BP:  (!) 168/102 (!) 175/89 (!) 141/90  Pulse:  84 84 85  Resp:  18  20  Temp:  97.8 F (36.6 C)  98.7 F (37.1 C)  TempSrc:    Oral  SpO2:  97% 97% 94%  Weight: (!) 142.5 kg     Height:        Intake/Output Summary (Last 24 hours) at 03/12/2020 1837 Last data filed at 03/12/2020 1651 Gross per 24 hour  Intake 154.22 ml    Output 2500 ml  Net -2345.78 ml   Filed Weights   03/10/20 0400 03/11/20 9509 03/12/20 0301  Weight: (!) 146.9 kg (!) 142.4 kg (!) 142.5 kg    Examination: General exam: Alert, awake, oriented x 3; still with significant fluid overload on examination; no requiring oxygen supplementation and expressing feeling better and breathing easier.  Patient reports some nasal congestion and feeling tired and weak. Respiratory system: No wheezing, no using accessory muscles, no requiring oxygen supplementation.  No tachypnea.  On exam.  Positive fine crackles present. Cardiovascular system: Unable to properly assess for JVD due to body habitus; no rubs, no gallops, no murmur. Gastrointestinal system: Abdomen is obese, nondistended, soft and nontender. No organomegaly or masses felt. Normal bowel sounds heard. Central nervous system: Alert and oriented. No focal neurological deficits. Extremities: No cyanosis or clubbing; 2+ edema appreciated bilaterally. Skin: No petechiae. Psychiatry: Judgement and insight appear normal. Mood & affect appropriate.    Data Reviewed: I have personally reviewed following labs and imaging studies  CBC: Recent Labs  Lab 03/08/20 1735 03/09/20 1600  WBC 8.7 7.9  NEUTROABS 7.1 5.7  HGB 12.8 13.0  HCT 41.6 41.7  MCV 101.5* 102.0*  PLT 236 228    Basic Metabolic Panel: Recent Labs  Lab 03/08/20 1735 03/09/20 1600 03/10/20 0426 03/11/20 0604 03/12/20 0642  NA 138 141 144 137 137  K 3.3* 3.2* 3.6 2.6* 2.7*  CL 102 103 102 93* 91*  CO2 26 26 32 32 34*  GLUCOSE 214* 186* 149* 209* 198*  BUN 10 10 10 14 14   CREATININE 1.04* 1.00 1.04* 1.24* 1.23*  CALCIUM 9.1 9.2 9.1 9.2 9.9  MG  --  1.9  --   --   --     GFR: Estimated Creatinine Clearance: 75.8 mL/min (A) (by C-G formula based on SCr of 1.23 mg/dL (H)).  Liver Function Tests: Recent Labs  Lab 03/08/20 1735 03/09/20 1600  AST 16 15  ALT 11 9  ALKPHOS 69 67  BILITOT 1.5* 1.2  PROT 6.6 6.9   ALBUMIN 3.2* 3.4*    CBG: Recent Labs  Lab 03/11/20 1708 03/11/20 2033 03/12/20 0735 03/12/20 1115 03/12/20 1606  GLUCAP 219* 190* 178* 200* 191*     Recent Results (from the past 240 hour(s))  Resp panel by RT-PCR (RSV, Flu A&B, Covid) Nasopharyngeal Swab     Status: None   Collection Time: 03/09/20 12:07 PM   Specimen: Nasopharyngeal Swab; Nasopharyngeal(NP) swabs in vial transport medium  Result Value Ref Range Status   SARS Coronavirus 2 by RT PCR NEGATIVE NEGATIVE Final    Comment: (NOTE) SARS-CoV-2 target nucleic acids are NOT DETECTED.  The SARS-CoV-2 RNA is generally detectable in upper respiratory specimens during the acute phase of infection. The lowest concentration of SARS-CoV-2 viral copies this assay can detect is 138 copies/mL. A negative result does not preclude SARS-Cov-2 infection and should not be used as the sole basis for treatment or other patient management decisions. A negative result may occur with  improper specimen collection/handling, submission of specimen other than nasopharyngeal swab, presence of viral mutation(s) within the areas targeted by this assay, and inadequate  number of viral copies(<138 copies/mL). A negative result must be combined with clinical observations, patient history, and epidemiological information. The expected result is Negative.  Fact Sheet for Patients:  BloggerCourse.com  Fact Sheet for Healthcare Providers:  SeriousBroker.it  This test is no t yet approved or cleared by the [place] FDA and  has been authorized for detection and/or diagnosis of SARS-CoV-2 by FDA under an Emergency Use Authorization (EUA). This EUA will remain  in effect (meaning this test can be used) for the duration of the COVID-19 declaration under Section 564(b)(1) of the Act, 21 U.S.C.section 360bbb-3(b)(1), unless the authorization is terminated  or revoked sooner.        Influenza A by PCR NEGATIVE NEGATIVE Final   Influenza B by PCR NEGATIVE NEGATIVE Final    Comment: (NOTE) The Xpert Xpress SARS-CoV-2/FLU/RSV plus assay is intended as an aid in the diagnosis of influenza from Nasopharyngeal swab specimens and should not be used as a sole basis for treatment. Nasal washings and aspirates are unacceptable for Xpert Xpress SARS-CoV-2/FLU/RSV testing.  Fact Sheet for Patients: BloggerCourse.com  Fact Sheet for Healthcare Providers: SeriousBroker.it  This test is not yet approved or cleared by the [place] FDA and has been authorized for detection and/or diagnosis of SARS-CoV-2 by FDA under an Emergency Use Authorization (EUA). This EUA will remain in effect (meaning this test can be used) for the duration of the COVID-19 declaration under Section 564(b)(1) of the Act, 21 U.S.C. section 360bbb-3(b)(1), unless the authorization is terminated or revoked.     Resp Syncytial Virus by PCR NEGATIVE NEGATIVE Final    Comment: (NOTE) Fact Sheet for Patients: BloggerCourse.com  Fact Sheet for Healthcare Providers: SeriousBroker.it  This test is not yet approved or cleared by the [place] FDA and has been authorized for detection and/or diagnosis of SARS-CoV-2 by FDA under an Emergency Use Authorization (EUA). This EUA will remain in effect (meaning this test can be used) for the duration of the COVID-19 declaration under Section 564(b)(1) of the Act, 21 U.S.C. section 360bbb-3(b)(1), unless the authorization is terminated or revoked.  Performed at [facility], [address] [place], [place] [address]   MRSA PCR Screening     Status: None   Collection Time: 03/09/20  7:53 PM   Specimen: Nasal Mucosa; Nasopharyngeal  Result Value Ref Range Status   MRSA by PCR NEGATIVE NEGATIVE Final    Comment:        The GeneXpert MRSA Assay (FDA approved  for NASAL specimens only), is one component of a comprehensive MRSA colonization surveillance program. It is not intended to diagnose MRSA infection nor to guide or monitor treatment for MRSA infections. Performed at [facility], [address], [place], [place] [address]      Radiology Studies: CT HEAD WO CONTRAST  Result Date: 03/11/2020 CLINICAL DATA:  Neuro deficit, stroke suspected. Weakness enlargement of fluid to legs in abdomen. EXAM: CT HEAD WITHOUT CONTRAST TECHNIQUE: Contiguous axial images were obtained from the base of the skull through the vertex without intravenous contrast. COMPARISON:  CT head 01/09/2009 FINDINGS: Brain: Patchy and confluent areas of decreased attenuation are noted throughout the deep and periventricular white matter of the cerebral hemispheres bilaterally, compatible with chronic microvascular ischemic disease. Prior left caudate lacunar infarction. No evidence of large-territorial acute infarction. No parenchymal hemorrhage. No mass lesion. No extra-axial collection. No mass effect or midline shift. No hydrocephalus. Basilar cisterns are patent. Vascular: No hyperdense vessel. Skull: No acute fracture or focal lesion.  Sinuses/Orbits: Paranasal sinuses and mastoid air cells are clear. The orbits are unremarkable. Other: None. IMPRESSION: No acute intracranial abnormality. Electronically Signed   By: Tish FredericksonMorgane  Naveau M.D.   On: 03/11/2020 21:12    Scheduled Meds: . buPROPion  150 mg Oral Daily  . carvedilol  6.25 mg Oral BID WC  . escitalopram  20 mg Oral Daily  . furosemide  60 mg Intravenous Q12H  . heparin injection (subcutaneous)  5,000 Units Subcutaneous Q8H  . insulin aspart  0-15 Units Subcutaneous TID WC  . insulin detemir  7 Units Subcutaneous QHS  . isosorbide-hydrALAZINE  1 tablet Oral BID  . levothyroxine  50 mcg Oral QAC breakfast  . losartan  50 mg Oral Daily  . metolazone  2.5 mg Oral Daily  . potassium chloride  40 mEq Oral TID  .  sodium chloride flush  3 mL Intravenous Q12H   Continuous Infusions: . sodium chloride       LOS: 3 days    Time spent: 30 minutes    Vassie Lollarlos Jarid Sasso, MD [facility]   To contact the attending provider between 7A-7P or the covering provider during after hours 7P-7A, please log into the web site [email or web address] and access using universal [place] password for that web site. If you do not have the password, please call the hospital operator.  03/12/2020, 6:37 PM

## 2020-03-12 NOTE — Plan of Care (Signed)
  Problem: Education: Goal: Knowledge of General Education information will improve Description Including pain rating scale, medication(s)/side effects and non-pharmacologic comfort measures Outcome: Progressing   Problem: Health Behavior/Discharge Planning: Goal: Ability to manage health-related needs will improve Outcome: Progressing   

## 2020-03-13 DIAGNOSIS — I5023 Acute on chronic systolic (congestive) heart failure: Secondary | ICD-10-CM | POA: Diagnosis not present

## 2020-03-13 DIAGNOSIS — R601 Generalized edema: Secondary | ICD-10-CM | POA: Diagnosis not present

## 2020-03-13 DIAGNOSIS — I169 Hypertensive crisis, unspecified: Secondary | ICD-10-CM | POA: Diagnosis not present

## 2020-03-13 LAB — BASIC METABOLIC PANEL
Anion gap: 13 (ref 5–15)
BUN: 18 mg/dL (ref 6–20)
CO2: 35 mmol/L — ABNORMAL HIGH (ref 22–32)
Calcium: 10.3 mg/dL (ref 8.9–10.3)
Chloride: 88 mmol/L — ABNORMAL LOW (ref 98–111)
Creatinine, Ser: 1.2 mg/dL — ABNORMAL HIGH (ref 0.44–1.00)
GFR, Estimated: 54 mL/min — ABNORMAL LOW (ref >60)
Glucose, Bld: 206 mg/dL — ABNORMAL HIGH (ref 70–99)
Potassium: 2.9 mmol/L — ABNORMAL LOW (ref 3.5–5.1)
Sodium: 136 mmol/L (ref 135–145)

## 2020-03-13 LAB — GLUCOSE, CAPILLARY
Glucose-Capillary: 166 mg/dL — ABNORMAL HIGH (ref 70–99)
Glucose-Capillary: 158 mg/dL — ABNORMAL HIGH (ref 70–99)
Glucose-Capillary: 208 mg/dL — ABNORMAL HIGH (ref 70–99)

## 2020-03-13 MED ORDER — VITAMIN D (ERGOCALCIFEROL) 1.25 MG (50000 UNIT) PO CAPS
50000.0000 [IU] | ORAL_CAPSULE | ORAL | Status: DC
  Administered 2020-03-13: 50000 [IU] via ORAL
  Filled 2020-03-13 (×2): qty 1

## 2020-03-13 MED ORDER — POTASSIUM CHLORIDE 10 MEQ/100ML IV SOLN
10.0000 meq | INTRAVENOUS | Status: AC
  Administered 2020-03-13 (×2): 10 meq via INTRAVENOUS
  Filled 2020-03-13 (×2): qty 100

## 2020-03-13 NOTE — Evaluation (Deleted)
Physical Therapy Evaluation Patient Details Name: Connie Shepard MRN: [id number] DOB: 1965-05-23 Today's Date: 03/13/2020   History of Present Illness  Windie Marasco is a 54 y.o. female with medical history significant of morbid obesity, depression/anxiety, systolic heart failure (last echo with ejection fraction 30 to 35%), hypertension, medication noncompliance and type 2 diabetes; presented to the emergency department with ongoing shortness of breath and fluid overload.  Patient expressed that her symptoms have been present for the last 3-4 weeks and worsening.  She reports lack of medication compliance, follow-up with outpatient providers and expressed ongoing diet indiscretion.  Patient denies fever, chest pain, nausea, vomiting, dysuria, hematuria, melena, hematochezia, focal neurologic deficits or any other complaints.   Clinical Impression    Patient sitting up in bed at start of session and eager to participate in therapy. Performed all bed mobilities independently. Upon standing up patient reported woozy symptoms and weakness. Patient also reported she had just had medication and she normally doesn't take anything. Symptoms decreased with sitting and trailed standing again, able to ambulate with supervision and verbal cues to not push walker out in front of her but woozy symptoms returned.  Patient will continue to benefit from skilled physical therapy in hospital and recommended venue below to increase strength, balance, endurance for safe ADLs and gait. Patient would also benefit from reccommended equipment at home to improve safe transition home.   Follow Up Recommendations Home health PT;Supervision for mobility/OOB;Supervision - Intermittent    Equipment Recommendations  3in1 (PT)    Recommendations for Other Services       Precautions / Restrictions        Mobility  Bed Mobility Overal bed mobility: Independent                  Transfers Overall transfer level:  Independent                  Ambulation/Gait Ambulation/Gait assistance: Supervision Gait Distance (Feet): 8 Feet Assistive device: Rolling walker (2 wheeled)   Gait velocity: decreased   General Gait Details: patient sleepy and woozy with standing and walking, pushes walker out in front of her  Stairs            Wheelchair Mobility    Modified Rankin (Stroke Patients Only)       Balance Overall balance assessment: Modified Independent                                           Pertinent Vitals/Pain Pain Assessment: 0-10 Pain Score: 5  Pain Location: right hip Pain Descriptors / Indicators: Aching;Sore    Home Living Family/patient expects to be discharged to:: Private residence Living Arrangements: Children;Other relatives Available Help at Discharge: Family Type of Home: House Home Access: Level entry     Home Layout: Able to live on main level with bedroom/bathroom;Two level Home Equipment: Walker - 2 wheels      Prior Function Level of Independence: Independent         Comments: doesn't use RW at baseline     Hand Dominance        Extremity/Trunk Assessment        Lower Extremity Assessment Lower Extremity Assessment: Generalized weakness       Communication   Communication: No difficulties  Cognition Arousal/Alertness: Suspect due to medications Behavior During Therapy: [facility] for tasks assessed/performed  General Comments: sleepy, just had medications      General Comments General comments (skin integrity, edema, etc.): good sitting and standing balance without UE support    Exercises General Exercises - Lower Extremity Long Arc Quad: AROM;Both;15 reps;Seated Hip Flexion/Marching: AROM;Both;10 reps;Seated   Assessment/Plan    PT Assessment Patient needs continued PT services  PT Problem List Decreased strength;Decreased activity tolerance;Decreased  balance;Decreased mobility;Pain       PT Treatment Interventions Gait training;Therapeutic activities;Therapeutic exercise;Manual techniques;Patient/family education;Neuromuscular re-education;Balance training    PT Goals (Current goals can be found in the Care Plan section)  Acute Rehab PT Goals Patient Stated Goal: to go home PT Goal Formulation: With patient Time For Goal Achievement: 03/27/20 Potential to Achieve Goals: Good    Frequency Min 3X/week   Barriers to discharge   needs BSC    Co-evaluation               AM-PAC PT "6 Clicks" Mobility  Outcome Measure Help needed turning from your back to your side while in a flat bed without using bedrails?: None Help needed moving from lying on your back to sitting on the side of a flat bed without using bedrails?: None Help needed moving to and from a bed to a chair (including a wheelchair)?: None Help needed standing up from a chair using your arms (e.g., wheelchair or bedside chair)?: None Help needed to walk in hospital room?: A Little Help needed climbing 3-5 steps with a railing? : A Little 6 Click Score: 22    End of Session Equipment Utilized During Treatment: Gait belt Activity Tolerance: Patient limited by lethargy Patient left: in bed;with call bell/phone within reach Nurse Communication: Mobility status PT Visit Diagnosis: Unsteadiness on feet (R26.81);Muscle weakness (generalized) (M62.81);History of falling (Z91.81);Pain Pain - Right/Left: Right Pain - part of body: Hip    Time: 1022-1044 PT Time Calculation (min) (ACUTE ONLY): 22 min   Charges:   PT Evaluation $PT Eval Low Complexity: 1 Low         11:03 AM, 03/13/20 Tereasa Coop, DPT Physical Therapy with [facility]  [phone] office

## 2020-03-13 NOTE — Plan of Care (Signed)
  Problem: Education: Goal: Knowledge of General Education information will improve Description Including pain rating scale, medication(s)/side effects and non-pharmacologic comfort measures Outcome: Progressing   

## 2020-03-13 NOTE — Evaluation (Signed)
Physical Therapy Evaluation Patient Details Name: Connie Shepard MRN: [id number] DOB: [birth year] Today's Date: 03/13/2020   History of Present Illness  Detta Mellin is a 54 y.o. female with medical history significant of morbid obesity, depression/anxiety, systolic heart failure (last echo with ejection fraction 30 to 35%), hypertension, medication noncompliance and type 2 diabetes; presented to the emergency department with ongoing shortness of breath and fluid overload.  Patient expressed that her symptoms have been present for the last 3-4 weeks and worsening.  She reports lack of medication compliance, follow-up with outpatient providers and expressed ongoing diet indiscretion.  Patient denies fever, chest pain, nausea, vomiting, dysuria, hematuria, melena, hematochezia, focal neurologic deficits or any other complaints.  Of note, she does not use oxygen supplementation chronically.    Clinical Impression    Patient sitting up in recliner at start of therapy with 2LO2 on. Took O2 off and monitored vitals throughout session. Patient modified independent with all transfers and ambulation. Typically, patient reports not using RW at home but using walls/furniture when she walks. Educated patient in use of RW for safety. SPO2 ranged from 84-94% during ambulation and exercises while on room air. Verbal cues throughput session for deep nasal breathing which improved O2 levels.  Weakness in legs reported but no other symptoms. Put O2 back on patient end of session as O2 was hovering around 91%. Nursing notified of mobility status, oxygen levels and wet linens.  Patient will continue to benefit from skilled physical therapy in hospital and recommended venue below to increase strength, balance, endurance for safe ADLs and gait.   Follow Up Recommendations Home health PT;Supervision - Intermittent    Equipment Recommendations       Recommendations for Other Services       Precautions / Restrictions  Precautions Precautions: Fall Restrictions Weight Bearing Restrictions: No      Mobility  Bed Mobility               General bed mobility comments: patient sitting in chair at start of session, reports no difficulties getting in/out of bed, did not want to get back in bed for assessment    Transfers Overall transfer level: Modified independent Equipment used: Rolling walker (2 wheeled)                Ambulation/Gait Ambulation/Gait assistance: Supervision;Modified independent (Device/Increase time) Gait Distance (Feet): 40 Feet Assistive device: Rolling walker (2 wheeled) Gait Pattern/deviations: Step-through pattern;Decreased stride length Gait velocity: decreased   General Gait Details: SPO2 monitoried throughout walking while on room air - ranged from 84-94%, required verbal cues for nasal breathing to correct for low fluctuations  Stairs            Wheelchair Mobility    Modified Rankin (Stroke Patients Only)       Balance Overall balance assessment: Modified Independent                                           Pertinent Vitals/Pain Pain Assessment: No/denies pain    Home Living Family/patient expects to be discharged to:: Private residence Living Arrangements: Children;Parent Available Help at Discharge: Family Type of Home: House Home Access: Stairs to enter Entrance Stairs-Rails: None Entrance Stairs-Number of Steps: 3 Home Layout: Two level;Able to live on main level with bedroom/bathroom Home Equipment: Shower seat;Bedside commode;Cane - single point;Walker - 2 wheels Additional Comments: at baseline does not use  assistive device and does not use O2    Prior Function Level of Independence: Independent         Comments: reports she was able to perform all cooking/cleaning she required at home     Hand Dominance        Extremity/Trunk Assessment        Lower Extremity Assessment Lower Extremity  Assessment: Generalized weakness       Communication   Communication: No difficulties  Cognition Arousal/Alertness: Awake/alert Behavior During Therapy: WFL for tasks assessed/performed Overall Cognitive Status: Within Functional Limits for tasks assessed                                        General Comments General comments (skin integrity, edema, etc.): able to stand and sit with no UE support without difficulties.    Exercises General Exercises - Lower Extremity Long Arc Quad: AROM;Strengthening;Both;20 reps;Seated Hip Flexion/Marching: AROM;Strengthening;Both;20 reps;Seated Heel Raises: AROM;Strengthening;Both;20 reps;Seated   Assessment/Plan    PT Assessment Patient needs continued PT services  PT Problem List Decreased strength;Decreased activity tolerance;Decreased mobility       PT Treatment Interventions Gait training;Stair training;Therapeutic activities;Therapeutic exercise;Patient/family education;Neuromuscular re-education;Balance training    PT Goals (Current goals can be found in the Care Plan section)  Acute Rehab PT Goals Patient Stated Goal: to go home PT Goal Formulation: With patient Time For Goal Achievement: 03/27/20 Potential to Achieve Goals: Good    Frequency Min 3X/week   Barriers to discharge        Co-evaluation               AM-PAC PT "6 Clicks" Mobility  Outcome Measure Help needed turning from your back to your side while in a flat bed without using bedrails?: None Help needed moving from lying on your back to sitting on the side of a flat bed without using bedrails?: None Help needed moving to and from a bed to a chair (including a wheelchair)?: None Help needed standing up from a chair using your arms (e.g., wheelchair or bedside chair)?: None Help needed to walk in hospital room?: None Help needed climbing 3-5 steps with a railing? : None 6 Click Score: 24    End of Session Equipment Utilized During  Treatment: Gait belt Activity Tolerance: Patient tolerated treatment well Patient left: in chair;with call bell/phone within reach Nurse Communication: Mobility status;Other (comment) (wet linens) PT Visit Diagnosis: Unsteadiness on feet (R26.81);Other abnormalities of gait and mobility (R26.89);Muscle weakness (generalized) (M62.81);Difficulty in walking, not elsewhere classified (R26.2)    Time: 1201-1223 PT Time Calculation (min) (ACUTE ONLY): 22 min   Charges:   PT Evaluation $PT Eval Low Complexity: 1 Low          12:58 PM, 03/13/20 Tereasa Coop, DPT Physical Therapy with [facility]  [phone] office

## 2020-03-13 NOTE — Progress Notes (Signed)
PROGRESS NOTE    Connie Shepard  ENI:[id number] DOB: [birth year] DOA: 03/09/2020 PCP: Remus Loffler, PA-C   Chief Complaint  Patient presents with  . Shortness of Breath    Brief Narrative:  Connie Shepard is a 54 y.o. female with medical history significant of morbid obesity, depression/anxiety, systolic heart failure (last echo with ejection fraction 30 to 35%), hypertension, medication noncompliance and type 2 diabetes; presented to the emergency department with ongoing shortness of breath and fluid overload.  Patient expressed that her symptoms have been present for the last 3-4 weeks and worsening.  She reports lack of medication compliance, follow-up with outpatient providers and expressed ongoing diet indiscretion.  Patient denies fever, chest pain, nausea, vomiting, dysuria, hematuria, melena, hematochezia, focal neurologic deficits or any other complaints.   Of note, she does not use oxygen supplementation chronically.  ED Course: Massive fluid overload; chest x-ray with vascular congestion and pulmonary edema.  Patient using 2 L nasal cannula supplementation.  Accepted to be admitted for further evaluation and management of acute on chronic systolic heart failure.  IV Lasix initiated in the ED.  Assessment & Plan: 1-acute on chronic systolic HF -continue daily weights and strict I's and O's -continue to follow low sodium diet -continue IV diuresis and metolazone as ordered -will also continue b-blocker and ARB. -follow electrolytes and renal function closely -will arrange follow up with cardiology service at discharge.  2-hypertensive crisis -improved and much better controlled currently -continue to follow VS -continue current antihypertensive regimen   3-type 2 diabetes -will continue SSI and adjusted dose of Levemir -A1C 8.2 demonstrating poor diabetes management.  4-hypokalemia -follow electrolytes and further replete as needed  5-depression and anxiety -continue  Lexapro and Wellbutrin -no SI or hallucinations   6-morbid obesity -Body mass index is 53.96 kg/m. -low calorie diet, portion control and increase physical activity discussed with patient. -patient will benefit of outpatient bariatric clinic referall/evaluation.  7-poor balance/mood changes -CT head without contrast will be ordered to rule out any abnormalities. -Will check B12 and vitamin D - physical therapy evaluation requested.  8-hypothyroidism -Continue Synthroid 50 mcg daily -Repeat thyroid panel in 3 months.  9-hypokalemia -In the setting of acute diuresis -K 2.9 -Will continue electrolyte repletion and supplementation -Follow trend -Continue telemetry monitoring.  10-vitamin D deficiency appreciated -will Initiate high-dose weekly supplementation.  DVT prophylaxis: heparin Code Status: Full code. Family Communication: Mother at bedside. Disposition:   Status is: Inpatient  Dispo: The patient is from: home               Anticipated d/c is to: home               Anticipated d/c date is: 1 day              Patient currently not medically stable for discharge, still with significant fluid overload, complaining of SOB with exertion and expressing orthopnea. Will continue IV diuresis and continue electrolyte repletion..      Consultants:   None    Procedures:  See below for x-ray reports 2-D echo: limited study due to body habitus; estimated EF 35%.   Antimicrobials:  None    Subjective: Feeling short of breath with activity; still having significant signs of fluid overload on examination.  Will continue IV diuresis.  Appreciate evaluation by physical therapy, will arrange for home health services at discharge.  Objective: Vitals:   03/12/20 2026 03/13/20 0149 03/13/20 0409 03/13/20 1500  BP: (!) 157/94  (!) 141/72  126/68  Pulse: 82  82 90  Resp: 20  17 18   Temp: 98.4 F (36.9 C)  98.4 F (36.9 C) 98.6 F (37 C)  TempSrc: Oral   Oral  SpO2: 96%   99% 92%  Weight:  (!) 142.6 kg    Height:        Intake/Output Summary (Last 24 hours) at 03/13/2020 1630 Last data filed at 03/13/2020 0419 Gross per 24 hour  Intake 154.22 ml  Output 400 ml  Net -245.78 ml   Filed Weights   03/11/20 03/13/20 03/12/20 0301 03/13/20 0149  Weight: (!) 142.4 kg (!) 142.5 kg (!) 142.6 kg    Examination: General exam: Alert, awake, oriented x 3; no chest pain, no nausea, no vomiting.  Feeling weak and tired.  Expressing improvement in her breathing but is still having significant fluid overload on examination. Respiratory system: Improved air movement bilaterally; no using accessory muscles.  No requiring O2 sat supplementation.  Fine crackles still appreciated at the bases. Cardiovascular system:RRR. No murmurs, rubs, gallops.  Unable to properly assess JVD due to body habitus. Gastrointestinal system: Abdomen is obese, increased abdominal girth appreciated on exam; no guarding, nondistended, soft and nontender. No organomegaly or masses felt. Normal bowel sounds heard. Central nervous system: Alert and oriented. No focal neurological deficits. Extremities: No cyanosis or clubbing; 2+ edema appreciated bilaterally. Skin: No petechiae. Psychiatry: Judgement and insight appear normal. Mood & affect appropriate.    Data Reviewed: I have personally reviewed following labs and imaging studies  CBC: Recent Labs  Lab 03/08/20 1735 03/09/20 1600  WBC 8.7 7.9  NEUTROABS 7.1 5.7  HGB 12.8 13.0  HCT 41.6 41.7  MCV 101.5* 102.0*  PLT 236 228    Basic Metabolic Panel: Recent Labs  Lab 03/09/20 1600 03/10/20 0426 03/11/20 0604 03/12/20 0642 03/13/20 0644  NA 141 144 137 137 136  K 3.2* 3.6 2.6* 2.7* 2.9*  CL 103 102 93* 91* 88*  CO2 26 32 32 34* 35*  GLUCOSE 186* 149* 209* 198* 206*  BUN 10 10 14 14 18   CREATININE 1.00 1.04* 1.24* 1.23* 1.20*  CALCIUM 9.2 9.1 9.2 9.9 10.3  MG 1.9  --   --   --   --     GFR: Estimated Creatinine Clearance:  77.8 mL/min (A) (by C-G formula based on SCr of 1.2 mg/dL (H)).  Liver Function Tests: Recent Labs  Lab 03/08/20 1735 03/09/20 1600  AST 16 15  ALT 11 9  ALKPHOS 69 67  BILITOT 1.5* 1.2  PROT 6.6 6.9  ALBUMIN 3.2* 3.4*    CBG: Recent Labs  Lab 03/12/20 0735 03/12/20 1115 03/12/20 1606 03/13/20 0852 03/13/20 1240  GLUCAP 178* 200* 191* 166* 158*     Recent Results (from the past 240 hour(s))  Resp panel by RT-PCR (RSV, Flu A&B, Covid) Nasopharyngeal Swab     Status: None   Collection Time: 03/09/20 12:07 PM   Specimen: Nasopharyngeal Swab; Nasopharyngeal(NP) swabs in vial transport medium  Result Value Ref Range Status   SARS Coronavirus 2 by RT PCR NEGATIVE NEGATIVE Final    Comment: (NOTE) SARS-CoV-2 target nucleic acids are NOT DETECTED.  The SARS-CoV-2 RNA is generally detectable in upper respiratory specimens during the acute phase of infection. The lowest concentration of SARS-CoV-2 viral copies this assay can detect is 138 copies/mL. A negative result does not preclude SARS-Cov-2 infection and should not be used as the sole basis for treatment or other patient management decisions. A negative  result may occur with  improper specimen collection/handling, submission of specimen other than nasopharyngeal swab, presence of viral mutation(s) within the areas targeted by this assay, and inadequate number of viral copies(<138 copies/mL). A negative result must be combined with clinical observations, patient history, and epidemiological information. The expected result is Negative.  Fact Sheet for Patients:  BloggerCourse.com  Fact Sheet for Healthcare Providers:  SeriousBroker.it  This test is no t yet approved or cleared by the [place] FDA and  has been authorized for detection and/or diagnosis of SARS-CoV-2 by FDA under an Emergency Use Authorization (EUA). This EUA will remain  in effect (meaning this  test can be used) for the duration of the COVID-19 declaration under Section 564(b)(1) of the Act, 21 U.S.C.section 360bbb-3(b)(1), unless the authorization is terminated  or revoked sooner.       Influenza A by PCR NEGATIVE NEGATIVE Final   Influenza B by PCR NEGATIVE NEGATIVE Final    Comment: (NOTE) The Xpert Xpress SARS-CoV-2/FLU/RSV plus assay is intended as an aid in the diagnosis of influenza from Nasopharyngeal swab specimens and should not be used as a sole basis for treatment. Nasal washings and aspirates are unacceptable for Xpert Xpress SARS-CoV-2/FLU/RSV testing.  Fact Sheet for Patients: BloggerCourse.com  Fact Sheet for Healthcare Providers: SeriousBroker.it  This test is not yet approved or cleared by the [place] FDA and has been authorized for detection and/or diagnosis of SARS-CoV-2 by FDA under an Emergency Use Authorization (EUA). This EUA will remain in effect (meaning this test can be used) for the duration of the COVID-19 declaration under Section 564(b)(1) of the Act, 21 U.S.C. section 360bbb-3(b)(1), unless the authorization is terminated or revoked.     Resp Syncytial Virus by PCR NEGATIVE NEGATIVE Final    Comment: (NOTE) Fact Sheet for Patients: BloggerCourse.com  Fact Sheet for Healthcare Providers: SeriousBroker.it  This test is not yet approved or cleared by the [place] FDA and has been authorized for detection and/or diagnosis of SARS-CoV-2 by FDA under an Emergency Use Authorization (EUA). This EUA will remain in effect (meaning this test can be used) for the duration of the COVID-19 declaration under Section 564(b)(1) of the Act, 21 U.S.C. section 360bbb-3(b)(1), unless the authorization is terminated or revoked.  Performed at [facility], [address], [place], [place] [address]   MRSA PCR Screening     Status: None    Collection Time: 03/09/20  7:53 PM   Specimen: Nasal Mucosa; Nasopharyngeal  Result Value Ref Range Status   MRSA by PCR NEGATIVE NEGATIVE Final    Comment:        The GeneXpert MRSA Assay (FDA approved for NASAL specimens only), is one component of a comprehensive MRSA colonization surveillance program. It is not intended to diagnose MRSA infection nor to guide or monitor treatment for MRSA infections. Performed at [facility], [address] [place], [place] [address]      Radiology Studies: CT HEAD WO CONTRAST  Result Date: 03/11/2020 CLINICAL DATA:  Neuro deficit, stroke suspected. Weakness enlargement of fluid to legs in abdomen. EXAM: CT HEAD WITHOUT CONTRAST TECHNIQUE: Contiguous axial images were obtained from the base of the skull through the vertex without intravenous contrast. COMPARISON:  CT head 01/09/2009 FINDINGS: Brain: Patchy and confluent areas of decreased attenuation are noted throughout the deep and periventricular white matter of the cerebral hemispheres bilaterally, compatible with chronic microvascular ischemic disease. Prior left caudate lacunar infarction. No evidence of large-territorial acute infarction. No parenchymal hemorrhage. No  mass lesion. No extra-axial collection. No mass effect or midline shift. No hydrocephalus. Basilar cisterns are patent. Vascular: No hyperdense vessel. Skull: No acute fracture or focal lesion. Sinuses/Orbits: Paranasal sinuses and mastoid air cells are clear. The orbits are unremarkable. Other: None. IMPRESSION: No acute intracranial abnormality. Electronically Signed   By: Tish FredericksonMorgane  Naveau M.D.   On: 03/11/2020 21:12    Scheduled Meds: . buPROPion  150 mg Oral Daily  . carvedilol  6.25 mg Oral BID WC  . escitalopram  20 mg Oral Daily  . furosemide  60 mg Intravenous Q12H  . heparin injection (subcutaneous)  5,000 Units Subcutaneous Q8H  . insulin aspart  0-15 Units Subcutaneous TID WC  . insulin detemir  7 Units Subcutaneous  QHS  . isosorbide-hydrALAZINE  1 tablet Oral BID  . levothyroxine  50 mcg Oral QAC breakfast  . losartan  50 mg Oral Daily  . potassium chloride  40 mEq Oral TID  . sodium chloride flush  3 mL Intravenous Q12H  . Vitamin D (Ergocalciferol)  50,000 Units Oral Q7 days   Continuous Infusions: . sodium chloride    . potassium chloride       LOS: 4 days    Time spent: 30 minutes    Vassie Lollarlos Vladislav Axelson, MD [facility]   To contact the attending provider between 7A-7P or the covering provider during after hours 7P-7A, please log into the web site [email or web address] and access using universal [facility] password for that web site. If you do not have the password, please call the hospital operator.  03/13/2020, 4:30 PM

## 2020-03-13 NOTE — Plan of Care (Signed)
°  Problem: Acute Rehab PT Goals(only PT should resolve) Goal: Pt Will Ambulate Flowsheets (Taken 03/13/2020 1303) Pt will Ambulate:  > 125 feet  with rolling walker  with supervision Goal: Pt Will Go Up/Down Stairs Flowsheets (Taken 03/13/2020 1303) Pt will Go Up / Down Stairs:  3-5 stairs  with minimal assist   1:04 PM, 03/13/20 Tereasa Coop, DPT Physical Therapy with [facility]  [phone] office

## 2020-03-14 DIAGNOSIS — E1169 Type 2 diabetes mellitus with other specified complication: Secondary | ICD-10-CM

## 2020-03-14 DIAGNOSIS — R5381 Other malaise: Secondary | ICD-10-CM

## 2020-03-14 DIAGNOSIS — Z6841 Body Mass Index (BMI) 40.0 and over, adult: Secondary | ICD-10-CM

## 2020-03-14 DIAGNOSIS — I10 Essential (primary) hypertension: Secondary | ICD-10-CM | POA: Diagnosis not present

## 2020-03-14 DIAGNOSIS — R601 Generalized edema: Secondary | ICD-10-CM | POA: Diagnosis not present

## 2020-03-14 DIAGNOSIS — E119 Type 2 diabetes mellitus without complications: Secondary | ICD-10-CM

## 2020-03-14 DIAGNOSIS — E876 Hypokalemia: Secondary | ICD-10-CM

## 2020-03-14 DIAGNOSIS — E039 Hypothyroidism, unspecified: Secondary | ICD-10-CM

## 2020-03-14 DIAGNOSIS — I5023 Acute on chronic systolic (congestive) heart failure: Secondary | ICD-10-CM | POA: Diagnosis not present

## 2020-03-14 DIAGNOSIS — E559 Vitamin D deficiency, unspecified: Secondary | ICD-10-CM

## 2020-03-14 LAB — GLUCOSE, CAPILLARY
Glucose-Capillary: 240 mg/dL — ABNORMAL HIGH (ref 70–99)
Glucose-Capillary: 256 mg/dL — ABNORMAL HIGH (ref 70–99)
Glucose-Capillary: 177 mg/dL — ABNORMAL HIGH (ref 70–99)
Glucose-Capillary: 212 mg/dL — ABNORMAL HIGH (ref 70–99)
Glucose-Capillary: 225 mg/dL — ABNORMAL HIGH (ref 70–99)

## 2020-03-14 LAB — BASIC METABOLIC PANEL
Anion gap: 14 (ref 5–15)
BUN: 18 mg/dL (ref 6–20)
CO2: 32 mmol/L (ref 22–32)
Calcium: 10.4 mg/dL — ABNORMAL HIGH (ref 8.9–10.3)
Chloride: 90 mmol/L — ABNORMAL LOW (ref 98–111)
Creatinine, Ser: 1.21 mg/dL — ABNORMAL HIGH (ref 0.44–1.00)
GFR, Estimated: 54 mL/min — ABNORMAL LOW (ref >60)
Glucose, Bld: 169 mg/dL — ABNORMAL HIGH (ref 70–99)
Potassium: 3.1 mmol/L — ABNORMAL LOW (ref 3.5–5.1)
Sodium: 136 mmol/L (ref 135–145)

## 2020-03-14 LAB — MAGNESIUM: Magnesium: 2 mg/dL (ref 1.7–2.4)

## 2020-03-14 MED ORDER — LEVOTHYROXINE SODIUM 50 MCG PO TABS
50.0000 ug | ORAL_TABLET | Freq: Every day | ORAL | 2 refills | Status: DC
Start: 2020-03-15 — End: 2020-09-07

## 2020-03-14 MED ORDER — POTASSIUM CHLORIDE CRYS ER 20 MEQ PO TBCR
20.0000 meq | EXTENDED_RELEASE_TABLET | Freq: Every day | ORAL | 1 refills | Status: DC
Start: 2020-03-14 — End: 2020-06-28

## 2020-03-14 MED ORDER — VITAMIN D (ERGOCALCIFEROL) 1.25 MG (50000 UNIT) PO CAPS
50000.0000 [IU] | ORAL_CAPSULE | ORAL | 1 refills | Status: DC
Start: 2020-03-20 — End: 2024-01-14

## 2020-03-14 MED ORDER — CARVEDILOL 12.5 MG PO TABS: 12.5000 mg | ORAL_TABLET | Freq: Two times a day (BID) | ORAL | 2 refills | Status: DC

## 2020-03-14 MED ORDER — TORSEMIDE 20 MG PO TABS: 40.0000 mg | ORAL_TABLET | Freq: Two times a day (BID) | ORAL | 2 refills | Status: DC

## 2020-03-14 NOTE — Progress Notes (Signed)
Nsg Discharge Note  Admit Date:  03/09/2020 Discharge date: 03/14/2020   Merian Capron to be D/C'd home per MD order.  AVS completed.  Copy for chart, and copy for patient signed, and dated. Patient/caregiver able to verbalize understanding. Awaiting ride for discharge.  Discharge Medication: Allergies as of 03/14/2020      Reactions   Hydroxyzine    Bad dreams      Medication List    STOP taking these medications   furosemide 40 MG tablet Commonly known as: LASIX     TAKE these medications   buPROPion 150 MG 24 hr tablet Commonly known as: Wellbutrin XL Take 1 tablet (150 mg total) by mouth daily.   carvedilol 12.5 MG tablet Commonly known as: COREG Take 1 tablet (12.5 mg total) by mouth 2 (two) times daily with a meal. What changed:   medication strength  how much to take   escitalopram 20 MG tablet Commonly known as: LEXAPRO Take 1 tablet (20 mg total) by mouth daily.   hydrALAZINE 50 MG tablet Commonly known as: APRESOLINE TAKE 1/2 TABLET 3 TIMES DAILY   levothyroxine 50 MCG tablet Commonly known as: SYNTHROID Take 1 tablet (50 mcg total) by mouth daily before breakfast. Start taking on: March 15, 2020   losartan 100 MG tablet Commonly known as: COZAAR TAKE ONE (1) TABLET EACH DAY   metFORMIN 500 MG tablet Commonly known as: GLUCOPHAGE Take 1 tablet (500 mg total) by mouth daily with breakfast.   potassium chloride SA 20 MEQ tablet Commonly known as: KLOR-CON Take 1 tablet (20 mEq total) by mouth daily.   spironolactone 25 MG tablet Commonly known as: ALDACTONE Take 1 tablet (25 mg total) by mouth daily.   torsemide 20 MG tablet Commonly known as: Demadex Take 2 tablets (40 mg total) by mouth 2 (two) times daily.   Vitamin D (Ergocalciferol) 1.25 MG (50000 UNIT) Caps capsule Commonly known as: DRISDOL Take 1 capsule (50,000 Units total) by mouth every 7 (seven) days. Start taking on: March 20, 2020   zolpidem 10 MG tablet Commonly  known as: AMBIEN Take 1 tablet (10 mg total) by mouth at bedtime as needed for sleep.            Durable Medical Equipment  (From admission, onward)         Start     Ordered   03/13/20 1639  For home use only DME Walker rolling  Once       Question Answer Comment  Walker: With 5 Inch Wheels   Patient needs a walker to treat with the following condition Balance disorder   Patient needs a walker to treat with the following condition Physical deconditioning      03/13/20 1639          Discharge Assessment: Vitals:   03/14/20 0615 03/14/20 1434  BP: (!) 180/106 (!) 143/72  Pulse: 81 87  Resp: 20 18  Temp: 97.6 F (36.4 C) 98.4 F (36.9 C)  SpO2: 97% 97%   Skin clean, dry and intact without evidence of skin break down, no evidence of skin tears noted. IV catheter discontinued intact. Site without signs and symptoms of complications - no redness or edema noted at insertion site, patient denies c/o pain - only slight tenderness at site.  Dressing with slight pressure applied.  D/c Instructions-Education: Discharge instructions given to patient/family with verbalized understanding. D/c education completed with patient/family including follow up instructions, medication list, d/c activities limitations if indicated, with other  d/c instructions as indicated by MD - patient able to verbalize understanding, all questions fully answered. Patient instructed to return to ED, call 911, or call MD for any changes in condition.  Patient escorted via WC, and D/C home via private auto.  Carlynn Herald, RN 03/14/2020 5:24 PM

## 2020-03-14 NOTE — TOC Transition Note (Addendum)
Transition of Care [facility]) - CM/SW Discharge Note   Patient Details  Name: Connie Shepard MRN: [id number] Date of Birth: [birth year]  [facility] CM/SW Contact:  Leitha Bleak, RN Phone Number: 03/14/2020, 3:34 PM   Clinical Narrative:   Patient discharging home. MD ordering RN/PT. Patient has [facility], TOC referred out to [facility], [facility], [facility]. TOC does not have a pair. Can not get agency to accept the referral. TOC spoke with patient mother. Patient is going to see PCP on Wednesday they will have them refer out of home health.  TOC made follow up Cardiology appointment and added to AVS.   Addendum : TOC was able to pair and Bonita Quin with [facility] accepted the referral for RN/PT. Called patient and added to AVS.   Final next level of care: Home/Self Care Barriers to Discharge: No Home Care Agency will accept this patient   Patient Goals and CMS Choice Patient states their goals for this hospitalization and ongoing recovery are:: Return home   Choice offered to / list presented to : NA  Discharge Placement        Discharge Plan and Services In-house Referral: Clinical Social Work Discharge Planning Services: CM Consult Post Acute Care Choice: NA

## 2020-03-14 NOTE — Discharge Summary (Signed)
Physician Discharge Summary  Connie Shepard DJM:[id number] DOB: 12/21/1965 DOA: 03/09/2020  PCP: Remus Loffler, PA-C  Admit date: 03/09/2020 Discharge date: 03/14/2020  Time spent: 35 minutes  Recommendations for Outpatient Follow-up:  1. Repeat vitamin D level in 2-month 2. Repeat thyroid panel in 32-month 3. Repeat basic metabolic panel follow-up visit to reassess electrolytes and renal function 4. Reassess blood pressure and adjust antihypertensive treatment as needed. 5. Continue to have close follow-up of patient's CBGs/A1c with further adjustment to hypoglycemic regimen as needed (given heart failure will definitely encourage the use of Jardiance).   Discharge Diagnoses:  Active Problems:   Essential hypertension   Morbid obesity with body mass index of 50.0-59.9 in adult [facility])   Acute on chronic systolic CHF (congestive heart failure) (HCC)   Acute on chronic systolic HF (heart failure) (HCC)   Anasarca   Diabetes mellitus (HCC)   Hypokalemia   Vitamin D deficiency   Hypothyroidism   Physical deconditioning   Discharge Condition: Stable and improved.  Discharged home with instruction to follow-up with PCP and cardiology service as an outpatient.  CODE STATUS: Full code  Diet recommendation: Low calorie, modified carbohydrate and heart healthy/low-sodium diet.  Filed Weights   03/11/20 2229 03/12/20 0301 03/13/20 0149  Weight: (!) 142.4 kg (!) 142.5 kg (!) 142.6 kg    History of present illness:  Dalaya Suppa a 54 y.o.femalewith medical history significant ofmorbid obesity, depression/anxiety, systolic heart failure (last echo with ejection fraction 30 to 35%), hypertension,medication noncomplianceandtype 2 diabetes;presented to the emergency department with ongoing shortness of breath and fluid overload. Patient expressed that her symptoms have been present for the last 3-4 weeksand worsening. She reports lack of medication compliance, follow-up with  outpatient providers and expressedongoing diet indiscretion. Patient denies fever, chest pain, nausea, vomiting, dysuria, hematuria, melena, hematochezia, focal neurologic deficits or any other complaints.   Of note, she does not use oxygen supplementation chronically.  ED Course:Massive fluid overload; chest x-ray with vascular congestion and pulmonary edema. Patient using 2 L nasal cannula supplementation. Accepted to be admitted for further evaluation and management of acute on chronic systolic heart failure. IV Lasix initiated in the ED.  Hospital Course:  1-acute on chronic systolic HF -continue daily weights and low sodium diet -Excellent response to IV diuresis and metolazone; patient negative over 22 L during this hospitalization -At discharge no orthopnea and no complaints of shortness of breath. -Discharge will continue carvedilol, Cozaar, hydralazine, spironolactone and Demadex. -Outpatient follow-up with cardiology service has been arranged.  2-hypertensive crisis -improved and much better controlled currently -continue to follow BP and further adjust antihypertensive regimen as required -Patient advised to follow heart healthy/low-sodium diet.  3-type 2 diabetes -Patient will resume home oral hypoglycemic regimen and low carbohydrate diet -Will recommend initiation of Jardiance as an outpatient. -A1C 8.2 demonstrating poor diabetes management.  4-depression and anxiety -continue Lexapro and Wellbutrin -no SI or hallucinations   5-morbid obesity -Body mass index is 53.96 kg/m. -low calorie diet, portion control and increase physical activity discussed with patient. -patient will benefit of outpatient bariatric clinic referall/evaluation.  6-poor balance/mood changes -CT head without contrast will be ordered to rule out any abnormalities. -Found to have low vitamin D -Normal B12. -physical therapy evaluated patient and recommended rolling walker and home  health PT.  7-hypothyroidism -Continue Synthroid 50 mcg daily -Repeat thyroid panel in 3 months.  8-hypokalemia -In the setting of acute diuresis -K 3.1 at time of discharge -Will continue electrolyte supplementation -Repeat basic metabolic  panel follow-up visit to assess trend/stability.    9-vitamin D deficiency appreciated -will Initiate high-dose weekly supplementation. -Repeat vitamin D level in 61-month  Procedures: See below for x-ray reports 2D echo:2-D echo: limited study due to body habitus; estimated EF 35%.  Consultations:  Cardiology service curbside and recommended outpatient follow-up.  Discharge Exam: Vitals:   03/14/20 0615 03/14/20 1434  BP: (!) 180/106 (!) 143/72  Pulse: 81 87  Resp: 20 18  Temp: 97.6 F (36.4 C) 98.4 F (36.9 C)  SpO2: 97% 97%    General: Afebrile, no chest pain, no nausea, no vomiting; reports no orthopnea significant improvement in her breathing at time of discharge.  No requiring oxygen supplementation and feeling ready to go home. Cardiovascular: No rubs, no gallops, unable to progressive JVD with body habitus. Respiratory: No frank crackles; no wheezing, good air movement bilaterally. Abdomen: Obese, nontender, nondistended, positive bowel sounds. Extremities: 1+ edema appreciated bilaterally; no cyanosis or clubbing.   Discharge Instructions   Discharge Instructions    (HEART FAILURE PATIENTS) Call MD:  Anytime you have any of the following symptoms: 1) 3 pound weight gain in 24 hours or 5 pounds in 1 week 2) shortness of breath, with or without a dry hacking cough 3) swelling in the hands, feet or stomach 4) if you have to sleep on extra pillows at night in order to breathe.   Complete by: As directed    Diet - low sodium heart healthy   Complete by: As directed    Discharge instructions   Complete by: As directed    Follow heart healthy/low-sodium diet (maximum 2000 mg on daily basis) Follow low calorie and modify  carbohydrates. Maintain adequate hydration Follow-up with PCP in 10 days Follow-up with cardiology service as instructed. Take medications as prescribed. Check your weight on daily basis.     Allergies as of 03/14/2020      Reactions   Hydroxyzine    Bad dreams      Medication List    STOP taking these medications   furosemide 40 MG tablet Commonly known as: LASIX     TAKE these medications   buPROPion 150 MG 24 hr tablet Commonly known as: Wellbutrin XL Take 1 tablet (150 mg total) by mouth daily.   carvedilol 12.5 MG tablet Commonly known as: COREG Take 1 tablet (12.5 mg total) by mouth 2 (two) times daily with a meal. What changed:   medication strength  how much to take   escitalopram 20 MG tablet Commonly known as: LEXAPRO Take 1 tablet (20 mg total) by mouth daily.   hydrALAZINE 50 MG tablet Commonly known as: APRESOLINE TAKE 1/2 TABLET 3 TIMES DAILY   levothyroxine 50 MCG tablet Commonly known as: SYNTHROID Take 1 tablet (50 mcg total) by mouth daily before breakfast. Start taking on: March 15, 2020   losartan 100 MG tablet Commonly known as: COZAAR TAKE ONE (1) TABLET EACH DAY   metFORMIN 500 MG tablet Commonly known as: GLUCOPHAGE Take 1 tablet (500 mg total) by mouth daily with breakfast.   potassium chloride SA 20 MEQ tablet Commonly known as: KLOR-CON Take 1 tablet (20 mEq total) by mouth daily.   spironolactone 25 MG tablet Commonly known as: ALDACTONE Take 1 tablet (25 mg total) by mouth daily.   torsemide 20 MG tablet Commonly known as: Demadex Take 2 tablets (40 mg total) by mouth 2 (two) times daily.   Vitamin D (Ergocalciferol) 1.25 MG (50000 UNIT) Caps capsule Commonly known as:  DRISDOL Take 1 capsule (50,000 Units total) by mouth every 7 (seven) days. Start taking on: March 20, 2020   zolpidem 10 MG tablet Commonly known as: AMBIEN Take 1 tablet (10 mg total) by mouth at bedtime as needed for sleep.             Durable Medical Equipment  (From admission, onward)         Start     Ordered   03/13/20 1639  For home use only DME Walker rolling  Once       Question Answer Comment  Walker: With 5 Inch Wheels   Patient needs a walker to treat with the following condition Balance disorder   Patient needs a walker to treat with the following condition Physical deconditioning      03/13/20 1639         Allergies  Allergen Reactions  . Hydroxyzine     Bad dreams    Follow-up Information    Jonelle Sidle, MD Follow up on 03/18/2020.   Specialty: Cardiology Why: You are scheduled to see Dr. Antoine Poche on Friday, December 3rd at 10:40am. Contact information: [address] [place] [address] [phone]        [facility] - Dme Follow up.   Why: PT/RN              The results of significant diagnostics from this hospitalization (including imaging, microbiology, ancillary and laboratory) are listed below for reference.    Significant Diagnostic Studies: CT HEAD WO CONTRAST  Result Date: 03/11/2020 CLINICAL DATA:  Neuro deficit, stroke suspected. Weakness enlargement of fluid to legs in abdomen. EXAM: CT HEAD WITHOUT CONTRAST TECHNIQUE: Contiguous axial images were obtained from the base of the skull through the vertex without intravenous contrast. COMPARISON:  CT head 01/09/2009 FINDINGS: Brain: Patchy and confluent areas of decreased attenuation are noted throughout the deep and periventricular white matter of the cerebral hemispheres bilaterally, compatible with chronic microvascular ischemic disease. Prior left caudate lacunar infarction. No evidence of large-territorial acute infarction. No parenchymal hemorrhage. No mass lesion. No extra-axial collection. No mass effect or midline shift. No hydrocephalus. Basilar cisterns are patent. Vascular: No hyperdense vessel. Skull: No acute fracture or focal lesion. Sinuses/Orbits: Paranasal sinuses and mastoid air  cells are clear. The orbits are unremarkable. Other: None. IMPRESSION: No acute intracranial abnormality. Electronically Signed   By: Tish Frederickson M.D.   On: 03/11/2020 21:12   DG Chest Port 1 View  Result Date: 03/09/2020 CLINICAL DATA:  Weakness EXAM: PORTABLE CHEST 1 VIEW COMPARISON:  03/08/2020 FINDINGS: Suboptimal evaluation due to portable technique, body habitus, and low lung volumes. Similar lung aeration with probable pulmonary vascular congestion and possible mild superimposed edema. Similar cardiomegaly. IMPRESSION: Similar lung aeration with probable pulmonary vascular congestion and possible mild superimposed edema. Cardiomegaly. Electronically Signed   By: Guadlupe Spanish M.D.   On: 03/09/2020 10:50   DG Chest Port 1 View  Result Date: 03/08/2020 CLINICAL DATA:  Shortness of breath for several weeks, chronic edema EXAM: PORTABLE CHEST 1 VIEW COMPARISON:  Portable exam 1756 hours compared to 04/04/2018 FINDINGS: Enlargement of cardiac silhouette with pulmonary vascular congestion. Interstitial infiltrate likely pulmonary edema and CHF. No gross pleural effusion or pneumothorax. Osseous structures unremarkable. IMPRESSION: Enlargement of cardiac silhouette with pulmonary vascular congestion and probable mild pulmonary edema. Electronically Signed   By: Ulyses Southward M.D.   On: 03/08/2020 18:26   ECHOCARDIOGRAM COMPLETE  Result Date: 03/09/2020  ECHOCARDIOGRAM REPORT   Patient Name:   JYLIAN PAPPALARDO Date of Exam: 03/09/2020 Medical Rec #:  [id number]    Height:       64.0 in Accession #:    [id number]   Weight:       285.0 lb Date of Birth:  09/26/1965   BSA:          2.274 m Patient Age:    53 years     BP:           195/144 mmHg Patient Gender: F            HR:           97 bpm. [facility] Procedure: 2D Echo Indications:    CHF-Acute Systolic 428.21 / I50.21  History:        Patient has prior history of Echocardiogram examinations, most                 recent 04/04/2018.  CHF; Risk Factors:Hypertension and                 Non-Smoker. Elevated Troponin.  Sonographer:    Jeryl Columbia RDCS (AE) Referring Phys: [id number] Deckard Stuber IMPRESSIONS  1. Very limited images.  2. Left ventricular ejection fraction, by estimation, is approximately 35%. The left ventricle has moderately decreased function. Left ventricular endocardial border not optimally defined to evaluate regional wall motion even with Definity contrast. There is mild left ventricular hypertrophy. Left ventricular diastolic parameters are indeterminate.  3. Right ventricular systolic function is severely reduced. The right ventricular size is normal.  4. The mitral valve is grossly normal. Trivial mitral valve regurgitation.  5. The aortic valve is tricuspid. There is mild calcification of the aortic valve. Aortic valve regurgitation is not visualized.  6. Unable to estimate CVP. FINDINGS  Left Ventricle: Left ventricular ejection fraction, by estimation, is 35%. The left ventricle has moderately decreased function. Left ventricular endocardial border not optimally defined to evaluate regional wall motion. Definity contrast agent was given IV to delineate the left ventricular endocardial borders. The left ventricular internal cavity size was normal in size. There is mild left ventricular hypertrophy. Left ventricular diastolic parameters are indeterminate. Right Ventricle: The right ventricular size is normal. No increase in right ventricular wall thickness. Right ventricular systolic function is severely reduced. Left Atrium: Left atrial size was normal in size. Right Atrium: Right atrial size was normal in size. Pericardium: There is no evidence of pericardial effusion. Mitral Valve: The mitral valve is grossly normal. Mild mitral annular calcification. Trivial mitral valve regurgitation. Tricuspid Valve: The tricuspid valve is grossly normal. Tricuspid valve regurgitation is mild. Aortic Valve: The aortic valve is  tricuspid. There is mild calcification of the aortic valve. Aortic valve regurgitation is not visualized. Pulmonic Valve: The pulmonic valve was grossly normal. Pulmonic valve regurgitation is trivial. Aorta: The aortic root is normal in size and structure. Venous: Unable to estimate CVP. The inferior vena cava was not well visualized. IAS/Shunts: The interatrial septum was not well visualized.  LEFT VENTRICLE PLAX 2D LVIDd:         4.89 cm  Diastology LVIDs:         4.04 cm  LV e' medial:    4.56 cm/s LV PW:         1.73 cm  LV E/e' medial:  16.6 LV IVS:        1.16 cm  LV e' lateral:   6.31 cm/s LVOT diam:  1.90 cm  LV E/e' lateral: 12.0 LVOT Area:     2.84 cm  RIGHT VENTRICLE RV S prime:     7.10 cm/s TAPSE (M-mode): 1.7 cm LEFT ATRIUM           Index       RIGHT ATRIUM           Index LA diam:      4.80 cm 2.11 cm/m  RA Area:     15.70 cm LA Vol (A4C): 71.2 ml 31.31 ml/m RA Volume:   47.20 ml  20.75 ml/m   AORTA Ao Root diam: 3.00 cm MITRAL VALVE               TRICUSPID VALVE MV Area (PHT): 3.46 cm    TR Peak grad:   38.4 mmHg MV Decel Time: 219 msec    TR Vmax:        310.00 cm/s MV E velocity: 75.80 cm/s                            SHUNTS                            Systemic Diam: 1.90 cm Nona DellSamuel Mcdowell MD Electronically signed by Nona DellSamuel Mcdowell MD Signature Date/Time: 03/09/2020/4:09:39 PM    Final     Microbiology: Recent Results (from the past 240 hour(s))  Resp panel by RT-PCR (RSV, Flu A&B, Covid) Nasopharyngeal Swab     Status: None   Collection Time: 03/09/20 12:07 PM   Specimen: Nasopharyngeal Swab; Nasopharyngeal(NP) swabs in vial transport medium  Result Value Ref Range Status   SARS Coronavirus 2 by RT PCR NEGATIVE NEGATIVE Final    Comment: (NOTE) SARS-CoV-2 target nucleic acids are NOT DETECTED.  The SARS-CoV-2 RNA is generally detectable in upper respiratory specimens during the acute phase of infection. The lowest concentration of SARS-CoV-2 viral copies this assay can  detect is 138 copies/mL. A negative result does not preclude SARS-Cov-2 infection and should not be used as the sole basis for treatment or other patient management decisions. A negative result may occur with  improper specimen collection/handling, submission of specimen other than nasopharyngeal swab, presence of viral mutation(s) within the areas targeted by this assay, and inadequate number of viral copies(<138 copies/mL). A negative result must be combined with clinical observations, patient history, and epidemiological information. The expected result is Negative.  Fact Sheet for Patients:  BloggerCourse.comhttps://www.fda.gov/media/152166/download  Fact Sheet for Healthcare Providers:  SeriousBroker.ithttps://www.fda.gov/media/152162/download  This test is no t yet approved or cleared by the [place] FDA and  has been authorized for detection and/or diagnosis of SARS-CoV-2 by FDA under an Emergency Use Authorization (EUA). This EUA will remain  in effect (meaning this test can be used) for the duration of the COVID-19 declaration under Section 564(b)(1) of the Act, 21 U.S.C.section 360bbb-3(b)(1), unless the authorization is terminated  or revoked sooner.       Influenza A by PCR NEGATIVE NEGATIVE Final   Influenza B by PCR NEGATIVE NEGATIVE Final    Comment: (NOTE) The Xpert Xpress SARS-CoV-2/FLU/RSV plus assay is intended as an aid in the diagnosis of influenza from Nasopharyngeal swab specimens and should not be used as a sole basis for treatment. Nasal washings and aspirates are unacceptable for Xpert Xpress SARS-CoV-2/FLU/RSV testing.  Fact Sheet for Patients: BloggerCourse.comhttps://www.fda.gov/media/152166/download  Fact Sheet for Healthcare Providers: SeriousBroker.ithttps://www.fda.gov/media/152162/download  This test is not  yet approved or cleared by the [place] and has been authorized for detection and/or diagnosis of SARS-CoV-2 by FDA under an Emergency Use Authorization (EUA). This EUA will remain in  effect (meaning this test can be used) for the duration of the COVID-19 declaration under Section 564(b)(1) of the Act, 21 U.S.C. section 360bbb-3(b)(1), unless the authorization is terminated or revoked.     Resp Syncytial Virus by PCR NEGATIVE NEGATIVE Final    Comment: (NOTE) Fact Sheet for Patients: BloggerCourse.com  Fact Sheet for Healthcare Providers: SeriousBroker.it  This test is not yet approved or cleared by the [place] FDA and has been authorized for detection and/or diagnosis of SARS-CoV-2 by FDA under an Emergency Use Authorization (EUA). This EUA will remain in effect (meaning this test can be used) for the duration of the COVID-19 declaration under Section 564(b)(1) of the Act, 21 U.S.C. section 360bbb-3(b)(1), unless the authorization is terminated or revoked.  Performed at [facility], [address], [place], [place] [address]   MRSA PCR Screening     Status: None   Collection Time: 03/09/20  7:53 PM   Specimen: Nasal Mucosa; Nasopharyngeal  Result Value Ref Range Status   MRSA by PCR NEGATIVE NEGATIVE Final    Comment:        The GeneXpert MRSA Assay (FDA approved for NASAL specimens only), is one component of a comprehensive MRSA colonization surveillance program. It is not intended to diagnose MRSA infection nor to guide or monitor treatment for MRSA infections. Performed at [facility], [address], [place], [place] [address]      Labs: Basic Metabolic Panel: Recent Labs  Lab 03/09/20 1600 03/09/20 1600 03/10/20 0426 03/11/20 0604 03/12/20 0642 03/13/20 0644 03/14/20 0628  NA 141   < > 144 137 137 136 136  K 3.2*   < > 3.6 2.6* 2.7* 2.9* 3.1*  CL 103   < > 102 93* 91* 88* 90*  CO2 26   < > 32 32 34* 35* 32  GLUCOSE 186*   < > 149* 209* 198* 206* 169*  BUN 10   < > CREATININE 1.00   < > 1.04* 1.24* 1.23* 1.20* 1.21*  CALCIUM 9.2   < > 9.1 9.2 9.9 10.3 10.4*  MG  1.9  --   --   --   --   --  2.0   < > = values in this interval not displayed.   Liver Function Tests: Recent Labs  Lab 03/08/20 1735 03/09/20 1600  AST 16 15  ALT 11 9  ALKPHOS 69 67  BILITOT 1.5* 1.2  PROT 6.6 6.9  ALBUMIN 3.2* 3.4*    Recent Labs  Lab 03/08/20 1738  AMMONIA 29   CBC: Recent Labs  Lab 03/08/20 1735 03/09/20 1600  WBC 8.7 7.9  NEUTROABS 7.1 5.7  HGB 12.8 13.0  HCT 41.6 41.7  MCV 101.5* 102.0*  PLT 236 228   BNP (last 3 results) Recent Labs    03/08/20 1738  BNP 699.0*    CBG: Recent Labs  Lab 03/13/20 1653 03/13/20 2021 03/14/20 0729 03/14/20 1112 03/14/20 1615  GLUCAP 208* 256* 177* 212* 225*    Signed:  Vassie Loll MD.  [facility] 03/14/2020, 4:35 PM

## 2020-03-16 DIAGNOSIS — E118 Type 2 diabetes mellitus with unspecified complications: Secondary | ICD-10-CM | POA: Insufficient documentation

## 2020-03-16 NOTE — Progress Notes (Signed)
Cardiology Office Note   Date:  03/18/2020   ID:  Connie Shepard, DOB Feb 27, 1966, MRN [id number]  PCP:  Toma Deiters, MD  Cardiologist:   Rollene Rotunda, MD   Chief Complaint  Patient presents with  . Shortness of Breath      History of Present Illness: Connie Shepard is a 54 y.o. female who presents for follow up after a recent hospitalization.  She has a history of uncontrolled hypertension and a reduced ejection fraction around 35% in 2019.  She was most recently in the hospital last month with acute on chronic systolic heart failure.  She diuresed over 22 pounds.  She had a hypertensive crisis.  Her A1c was 8.2.  She was seen in the hospital in 2019.  She was seen for uncontrolled HTN and an EF of 30%.  We have not seen her since then.  She did have a follow up echo in Nov which demonstrated the EF to be 35%.  She was on the hospital in Nov with volume overload.   She was not compliant with meds and diet as well as MD visits.   She has not been seen by [place] since 2019.  Surprisingly we were consulted in the hospital.  I do see that an echocardiogram done during that hospitalization demonstrated the EF to be 35%.  Right ventricular systolic function was severely reduced.  She just got home the other day.  She has not started weighing herself yet.  She is taking the medications.  She has significant problems with balance and gait and actually is working with physical therapy at home.  She is walking with a walker.  She is not having any new shortness of breath, PND or orthopnea.  She was having this prior but is much improved.  She thinks her breathing is better.  She is not describing any chest pressure, neck or arm discomfort.  Her lower extremity swelling is improved.     Hospital Course:  1-acute on chronic systolic HF -continue daily weights and low sodium diet -Excellent response to IV diuresis and metolazone; patient negative over 22 L during this hospitalization -At discharge  no orthopnea and no complaints of shortness of breath. -Discharge will continue carvedilol, Cozaar, hydralazine, spironolactone and Demadex. -Outpatient follow-up with cardiology service has been arranged.  2-hypertensive crisis -improved and much better controlled currently -continue to follow BP and further adjust antihypertensive regimen as required -Patient advised to follow heart healthy/low-sodium diet.  3-type 2 diabetes -Patient will resume home oral hypoglycemic regimen and low carbohydrate diet -Will recommend initiation of Jardiance as an outpatient. -A1C 8.2 demonstrating poor diabetes management.  4-depression and anxiety -continue Lexapro and Wellbutrin -no SI or hallucinations   5-morbid obesity -Body mass index is 53.96 kg/m. -low calorie diet, portion control and increase physical activity discussed with patient. -patient will benefit of outpatient bariatric clinic referall/evaluation.  6-poor balance/mood changes -CT head without contrast will be ordered to rule out any abnormalities. -Found to have low vitamin D -Normal B12. -physical therapy evaluated patient and recommended rolling walker and home health PT.  7-hypothyroidism -Continue Synthroid 50 mcg daily -Repeat thyroid panel in 3 months.  8-hypokalemia -In the setting of acute diuresis -K 3.1 at time of discharge -Will continue electrolyte supplementation -Repeat basic metabolic panel follow-up visit to assess trend/stability.    9-vitamin D deficiency appreciated -willInitiate high-dose weekly supplementation. -Repeat vitamin D level in 71-month  Past Medical History:  Diagnosis Date  . Acute heart  failure (HCC)   . ADD (attention deficit disorder)   . Bronchitis   . Depression   . Elevated troponin   . Hypertension   . Obesity     Past Surgical History:  Procedure Laterality Date  . CESAREAN SECTION       Current Outpatient Medications  Medication Sig Dispense Refill   . buPROPion (WELLBUTRIN XL) 150 MG 24 hr tablet Take 1 tablet (150 mg total) by mouth daily. 30 tablet 5  . carvedilol (COREG) 12.5 MG tablet Take 1 tablet (12.5 mg total) by mouth 2 (two) times daily with a meal. 60 tablet 2  . escitalopram (LEXAPRO) 20 MG tablet Take 1 tablet (20 mg total) by mouth daily. 30 tablet 5  . hydrALAZINE (APRESOLINE) 50 MG tablet TAKE 1/2 TABLET 3 TIMES DAILY 90 tablet 5  . levothyroxine (SYNTHROID) 50 MCG tablet Take 1 tablet (50 mcg total) by mouth daily before breakfast. 30 tablet 2  . metFORMIN (GLUCOPHAGE) 500 MG tablet Take 1 tablet (500 mg total) by mouth daily with breakfast. 90 tablet 3  . potassium chloride SA (KLOR-CON) 20 MEQ tablet Take 1 tablet (20 mEq total) by mouth daily. 30 tablet 1  . spironolactone (ALDACTONE) 25 MG tablet Take 1 tablet (25 mg total) by mouth daily. 90 tablet 3  . torsemide (DEMADEX) 20 MG tablet Take 2 tablets (40 mg total) by mouth 2 (two) times daily. 120 tablet 2  . traZODone (DESYREL) 50 MG tablet Take 1 tablet by mouth at bedtime as needed.    Melene Muller ON 03/20/2020] Vitamin D, Ergocalciferol, (DRISDOL) 1.25 MG (50000 UNIT) CAPS capsule Take 1 capsule (50,000 Units total) by mouth every 7 (seven) days. 8 capsule 1  . [START ON 03/19/2020] sacubitril-valsartan (ENTRESTO) 49-51 MG Take 1 tablet by mouth 2 (two) times daily. 60 tablet 6   No current facility-administered medications for this visit.    Allergies:   Hydroxyzine    Social History:  The patient  reports that she has never smoked. She has never used smokeless tobacco. She reports that she does not drink alcohol and does not use drugs.   Family History:  The patient's family history includes Heart disease in her father; Hypertension in her mother; Kidney Stones in her mother.    ROS:  Please see the history of present illness.   Otherwise, review of systems are positive for none.   All other systems are reviewed and negative.    PHYSICAL EXAM: VS:  BP 126/84    Pulse 80   Ht 5\' 4"  (1.626 m)   Wt 260 lb 6.4 oz (118.1 kg)   SpO2 98%   BMI 44.70 kg/m  , BMI Body mass index is 44.7 kg/m. GENERAL:  Well appearing HEENT:  Pupils equal round and reactive, fundi not visualized, oral mucosa unremarkable NECK:  No jugular venous distention, waveform within normal limits, carotid upstroke brisk and symmetric, no bruits, no thyromegaly LYMPHATICS:  No cervical, inguinal adenopathy LUNGS:  Clear to auscultation bilaterally BACK:  No CVA tenderness CHEST:  Unremarkable HEART:  PMI not displaced or sustained,S1 and S2 within normal limits, no S3, no S4, no clicks, no rubs, no murmurs ABD:  Flat, positive bowel sounds normal in frequency in pitch, no bruits, no rebound, no guarding, no midline pulsatile mass, no hepatomegaly, no splenomegaly EXT:  2 plus pulses throughout, no edema, no cyanosis no clubbing SKIN:  No rashes no nodules NEURO:  Cranial nerves II through XII grossly intact, motor grossly  intact throughout [facility]:  Cognitively intact, oriented to person place and time    EKG:  EKG is not ordered today.   Echo  1. Very limited images. 2. Left ventricular ejection fraction, by estimation, is approximately 35%. The left ventricle has moderately decreased function. Left ventricular endocardial border not optimally defined to evaluate regional wall motion even with Definity contrast. There is mild left ventricular hypertrophy. Left ventricular diastolic parameters are indeterminate. 3. Right ventricular systolic function is severely reduced. The right ventricular size is normal. 4. The mitral valve is grossly normal. Trivial mitral valve regurgitation. 5. The aortic valve is tricuspid. There is mild calcification of the aortic valve. Aortic valve regurgitation is not visualized. 6. Unable to estimate CVP.   Recent Labs: 03/08/2020: B Natriuretic Peptide 699.0 03/09/2020: ALT 9; Hemoglobin 13.0; Platelets 228; TSH 7.818 03/14/2020: BUN 18;  Creatinine, Ser 1.21; Magnesium 2.0; Potassium 3.1; Sodium 136    Lipid Panel    Component Value Date/Time   CHOL 206 (H) 09/03/2018 1450   TRIG 198 (H) 09/03/2018 1450   HDL 48 09/03/2018 1450   CHOLHDL 4.3 09/03/2018 1450   LDLCALC 118 (H) 09/03/2018 1450      Wt Readings from Last 3 Encounters:  03/18/20 260 lb 6.4 oz (118.1 kg)  03/13/20 (!) 314 lb 6 oz (142.6 kg)  09/03/18 273 lb (123.8 kg)      Other studies Reviewed: Additional studies/ records that were reviewed today include: Extensive review of hospital records  (Greater than 40 minutes reviewing all data with greater than 50% face to face with the patient). Review of the above records demonstrates:  Please see elsewhere in the note.     ASSESSMENT AND PLAN:  CARDIOMYOPATHY:   The patient has a cardiomyopathy that may be related to hypertensive heart disease but she has never had a work-up.  I had a long discussion with her and her mom about the need for compliance with medications, daily weights, salt and fluid restriction.  Today I am going to stop her Cozaar and start Entresto 49/51 twice daily.  I am going to send her to for a basic metabolic profile as she had some very mild renal insufficiency and was hypokalemic in the hospital.  I would like to see her back next month to further med titrate.  I would like to see her back a month after that at which point I will consider right and left heart catheterization to evaluate her left ventricular and her right ventricular dysfunction.  HTN:   This was uncontrolled in the hospital.  However, her blood pressure today seems to be better controlled and she should keep a blood pressure diary.  DM:  A1c was 8.2.   She needs to follow with Hasanaj, Myra Gianotti, MD  MORBID OBESITY: We had a long discussion about this.  I have sent her to see a nutritionist to talk about weight loss but also because they are asking excellent questions about the 4 g sodium diet and I am  suggesting.  Current medicines are reviewed at length with the patient today.  The patient does not have concerns regarding medicines.  The following changes have been made: As above  Labs/ tests ordered today include:   Orders Placed This Encounter  Procedures  . Basic metabolic panel  . Ambulatory referral to Nutrition and Diabetic Education     Disposition:   FU with APP in one month.     Signed, Rollene Rotunda, MD  03/18/2020 11:54  AM    [place]

## 2020-03-18 ENCOUNTER — Encounter: Payer: Self-pay | Admitting: Cardiology

## 2020-03-18 ENCOUNTER — Ambulatory Visit (INDEPENDENT_AMBULATORY_CARE_PROVIDER_SITE_OTHER): Payer: 59 | Admitting: Cardiology

## 2020-03-18 VITALS — BP 126/84 | HR 80 | Ht 64.0 in | Wt 260.4 lb

## 2020-03-18 DIAGNOSIS — I10 Essential (primary) hypertension: Secondary | ICD-10-CM | POA: Diagnosis not present

## 2020-03-18 DIAGNOSIS — I5021 Acute systolic (congestive) heart failure: Secondary | ICD-10-CM

## 2020-03-18 DIAGNOSIS — E118 Type 2 diabetes mellitus with unspecified complications: Secondary | ICD-10-CM

## 2020-03-18 MED ORDER — ENTRESTO 49-51 MG PO TABS: 1.0000 | ORAL_TABLET | Freq: Two times a day (BID) | ORAL | 6 refills | Status: DC

## 2020-03-18 NOTE — Patient Instructions (Addendum)
Medication Instructions:   Your physician has recommended you make the following change in your medication:   Stop losartan  Start entresto 49/51 mg by mouth twice daily  Continue other medications the same  Labwork:  Your physician recommends that you return for non-fasting lab work in: next week to check your BMET. This may be done at [facility] or [employer] or [employer] ([address] [place]) Monday-Friday from 8:00 am - 4:00 pm. No appointment is needed.  Testing/Procedures:  None  Follow-Up:  Your physician recommends that you schedule a follow-up appointment in: 1 month.  You have been referred to a nutritionist  Any Other Special Instructions Will Be Listed Below (If Applicable). Your physician recommends that you weigh, daily, at the same time every day, and in the same amount of clothing. Please record your daily weights on the handout provided and bring it to your next appointment.  If you need a refill on your cardiac medications before your next appointment, please call your pharmacy.

## 2020-03-23 ENCOUNTER — Encounter: Payer: Self-pay | Admitting: Internal Medicine

## 2020-04-17 NOTE — Progress Notes (Deleted)
Cardiology Office Note  Date: 04/17/2020   ID: Connie Shepard, DOB 03-07-1966, MRN [id number]  PCP:  Toma Deiters, MD  Cardiologist:  Rollene Rotunda, MD Electrophysiologist:  None   Chief Complaint: Follow-up acute systolic heart failure, hypertensive crisis.  History of Present Illness: Connie Shepard is a 55 y.o. female with a history of acute on chronic systolic heart failure, uncontrolled hypertension, morbid obesity, balance and gait changes along with mood changes, poorly controlled DM type II, hypothyroidism, hypokalemia, vitamin D deficiency,  Last encounter with Dr. Antoine Poche 03/18/2020 for hospital follow-up.  Recent hospitalization 03/09/2020 for acute on chronic systolic heart failure.  Diuresed over 22 pounds.  Had hypertensive crisis.  Previous echocardiogram demonstrating EF of around 35% in 2019.  Follow-up echo in November 2021 demonstrated EF of 35%.  She had not been compliant with medications and diet as well as follow-up medical visits.  Had not been seen since 2019.  She had recently been discharged from the hospital and had not started weighing herself.  She was compliant with her medications.  Was having issues with balance and gait.  She had been working with physical therapy at home.  She described her breathing as better. Her lower extremity edema had improved.  She was continuing carvedilol, Cozaar, hydralazine, spironolactone, Demadex.  Blood pressure had improved and much better controlled.  Recommended initiation of Jardiance as an outpatient due to recent hemoglobin A1c of 8.2% with poorly controlled diabetes.  Severely morbid obesity with BMI of 53.9.  Dr. Antoine Poche advised patient would benefit from outpatient bariatric clinic referral/evaluation.  Due to balance and mood changes.  CT of the head was ordered to rule out any abnormalities.  Vitamin D was low.  Normal B12.  Physical therapy evaluated and recommended rolling walker and home health PT.  She was hypokalemic  and was receiving potassium supplementation.  Basic metabolic panel was ordered.  Initiated high-dose weekly supplementation of vitamin D.  Repeat vitamin D level in 2 months.  Also ordered BMP at follow-up to reassess electrolytes and renal function.  Advise rechecking thyroid panel in 3 months.  Her Cozaar was stopped and Entresto was started at 49/51 mg p.o. twice daily.  Plans were to see her back the next month to further titrate medications.  Also plans were to see her back in a month for consideration of right and left heart catheterization to evaluate left and right ventricular dysfunction.  He advised her to keep a blood pressure diary.  She was to follow-up with PCP for uncontrolled diabetes.  She was sent to nutritionist to talk about weight loss and dietary modifications.   Recent head CT ordered for balance and gait disturbances was negative for acute process. However, patchy confluent areas of decreased attenuation throughout deep and peri-ventricular white matter of cerebral hemispheres bilaterally compatible with chronic microvascular disease. 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   Past Medical History:  Diagnosis Date  . Acute heart failure (HCC)   . ADD (attention deficit disorder)   . Bronchitis   . Depression   . Elevated troponin   . Hypertension   . Obesity     Past Surgical History:  Procedure Laterality Date  . CESAREAN SECTION      Current Outpatient Medications  Medication Sig Dispense Refill  . buPROPion (WELLBUTRIN XL) 150 MG 24 hr tablet Take 1 tablet (150 mg total) by mouth daily. 30 tablet 5  . carvedilol (COREG) 12.5 MG tablet Take 1 tablet (12.5 mg  total) by mouth 2 (two) times daily with a meal. 60 tablet 2  . escitalopram (LEXAPRO) 20 MG tablet Take 1 tablet (20 mg total) by mouth daily. 30 tablet 5  . hydrALAZINE (APRESOLINE) 50 MG tablet TAKE 1/2 TABLET 3 TIMES DAILY 90 tablet 5  . levothyroxine (SYNTHROID) 50 MCG tablet Take 1 tablet  (50 mcg total) by mouth daily before breakfast. 30 tablet 2  . metFORMIN (GLUCOPHAGE) 500 MG tablet Take 1 tablet (500 mg total) by mouth daily with breakfast. 90 tablet 3  . potassium chloride SA (KLOR-CON) 20 MEQ tablet Take 1 tablet (20 mEq total) by mouth daily. 30 tablet 1  . sacubitril-valsartan (ENTRESTO) 49-51 MG Take 1 tablet by mouth 2 (two) times daily. 60 tablet 6  . spironolactone (ALDACTONE) 25 MG tablet Take 1 tablet (25 mg total) by mouth daily. 90 tablet 3  . torsemide (DEMADEX) 20 MG tablet Take 2 tablets (40 mg total) by mouth 2 (two) times daily. 120 tablet 2  . traZODone (DESYREL) 50 MG tablet Take 1 tablet by mouth at bedtime as needed.    . Vitamin D, Ergocalciferol, (DRISDOL) 1.25 MG (50000 UNIT) CAPS capsule Take 1 capsule (50,000 Units total) by mouth every 7 (seven) days. 8 capsule 1   No current facility-administered medications for this visit.   Allergies:  Hydroxyzine   Social History: The patient  reports that she has never smoked. She has never used smokeless tobacco. She reports that she does not drink alcohol and does not use drugs.   Family History: The patient's family history includes Heart disease in her father; Hypertension in her mother; Kidney Stones in her mother.   ROS:  Please see the history of present illness. Otherwise, complete review of systems is positive for none.  All other systems are reviewed and negative.   Physical Exam: VS:  There were no vitals taken for this visit., BMI There is no height or weight on file to calculate BMI.  Wt Readings from Last 3 Encounters:  03/18/20 260 lb 6.4 oz (118.1 kg)  03/13/20 (!) 314 lb 6 oz (142.6 kg)  09/03/18 273 lb (123.8 kg)    General: Patient appears comfortable at rest. HEENT: Conjunctiva and lids normal, oropharynx clear with moist mucosa. Neck: Supple, no elevated JVP or carotid bruits, no thyromegaly. Lungs: Clear to auscultation, nonlabored breathing at rest. Cardiac: Regular rate and  rhythm, no S3 or significant systolic murmur, no pericardial rub. Abdomen: Soft, nontender, no hepatomegaly, bowel sounds present, no guarding or rebound. Extremities: No pitting edema, distal pulses 2+. Skin: Warm and dry. Musculoskeletal: No kyphosis. Neuropsychiatric: Alert and oriented x3, affect grossly appropriate.  ECG:  {EKG/Telemetry Strips Reviewed:[phone]}  Recent Labwork: 03/08/2020: B Natriuretic Peptide 699.0 03/09/2020: ALT 9; AST 15; Hemoglobin 13.0; Platelets 228; TSH 7.818 03/14/2020: BUN 18; Creatinine, Ser 1.21; Magnesium 2.0; Potassium 3.1; Sodium 136     Component Value Date/Time   CHOL 206 (H) 09/03/2018 1450   TRIG 198 (H) 09/03/2018 1450   HDL 48 09/03/2018 1450   CHOLHDL 4.3 09/03/2018 1450   LDLCALC 118 (H) 09/03/2018 1450    Other Studies Reviewed Today:  Echo 03/09/2020 1. Very limited images. 2. Left ventricular ejection fraction, by estimation, is approximately 35%. The left ventricle has moderately decreased function. Left ventricular endocardial border not optimally defined to evaluate regional wall motion even with Definity contrast. There is mild left ventricular hypertrophy. Left ventricular diastolic parameters are indeterminate. 3. Right ventricular systolic function is severely reduced. The  right ventricular size is normal. 4. The mitral valve is grossly normal. Trivial mitral valve regurgitation. 5. The aortic valve is tricuspid. There is mild calcification of the aortic valve. Aortic valve regurgitation is not visualized. 6. Unable to estimate CVP.   Assessment and Plan:  1. Acute on chronic systolic CHF (congestive heart failure) (HCC)   2. Essential hypertension   3. Morbid obesity with body mass index of 50.0-59.9 in adult (HCC)   4. Type 2 diabetes mellitus without complication, without long-term current use of insulin (HCC)      Medication Adjustments/Labs and Tests Ordered: Current medicines are reviewed at length  with the patient today.  Concerns regarding medicines are outlined above.   Disposition: Follow-up with ***  Signed, Rennis Harding, NP 04/17/2020 9:07 PM    [facility] [address] [place], [place], [place] [address] Phone: [phone]; Fax: [phone]

## 2020-04-18 ENCOUNTER — Ambulatory Visit: Payer: 59 | Admitting: Family Medicine

## 2020-04-18 DIAGNOSIS — E119 Type 2 diabetes mellitus without complications: Secondary | ICD-10-CM

## 2020-04-18 DIAGNOSIS — I5023 Acute on chronic systolic (congestive) heart failure: Secondary | ICD-10-CM

## 2020-04-18 DIAGNOSIS — I10 Essential (primary) hypertension: Secondary | ICD-10-CM

## 2020-04-25 NOTE — Progress Notes (Deleted)
Cardiology Office Note  Date: 04/25/2020   ID: Connie Shepard, DOB [birth year], MRN [id number]  PCP:  Toma Deiters, MD  Cardiologist:  Rollene Rotunda, MD Electrophysiologist:  None   Chief Complaint: Follow-up acute systolic heart failure, hypertensive crisis.  History of Present Illness: Connie Shepard is a 55 y.o. female with a history of acute on chronic systolic heart failure, uncontrolled hypertension, morbid obesity, balance and gait changes along with mood changes, poorly controlled DM type II, hypothyroidism, hypokalemia, vitamin D deficiency,  Last encounter with Dr. Antoine Poche 03/18/2020 for hospital follow-up.  Recent hospitalization 03/09/2020 for acute on chronic systolic heart failure.  Diuresed over 22 pounds.  Had hypertensive crisis.  Previous echocardiogram demonstrating EF of around 35% in 2019.  Follow-up echo in November 2021 demonstrated EF of 35%.  She had not been compliant with medications and diet as well as follow-up medical visits.  Had not been seen since 2019.  She had recently been discharged from the hospital and had not started weighing herself.  She was compliant with her medications.  Was having issues with balance and gait.  She had been working with physical therapy at home.  She described her breathing as better. Her lower extremity edema had improved.  She was continuing carvedilol, Cozaar, hydralazine, spironolactone, Demadex.  Blood pressure had improved and much better controlled.  Recommended initiation of Jardiance as an outpatient due to recent hemoglobin A1c of 8.2% with poorly controlled diabetes.  Severely morbid obesity with BMI of 53.9.  Dr. Antoine Poche advised patient would benefit from outpatient bariatric clinic referral/evaluation.  Due to balance and mood changes.  CT of the head was ordered to rule out any abnormalities.  Vitamin D was low.  Normal B12.  Physical therapy evaluated and recommended rolling walker and home health PT.  She was  hypokalemic and was receiving potassium supplementation.  Basic metabolic panel was ordered.  Initiated high-dose weekly supplementation of vitamin D.  Repeat vitamin D level in 2 months.  Also ordered BMP at follow-up to reassess electrolytes and renal function.  Advise rechecking thyroid panel in 3 months.  Her Cozaar was stopped and Entresto was started at 49/51 mg p.o. twice daily.  Plans were to see her back the next month to further titrate medications.  Also plans were to see her back in a month for consideration of right and left heart catheterization to evaluate left and right ventricular dysfunction.  He advised her to keep a blood pressure diary.  She was to follow-up with PCP for uncontrolled diabetes.  She was sent to nutritionist to talk about weight loss and dietary modifications.   Recent head CT ordered for balance and gait disturbances was negative for acute process. However, patchy confluent areas of decreased attenuation throughout deep and peri-ventricular white matter of cerebral hemispheres bilaterally compatible with chronic microvascular disease. 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   Past Medical History:  Diagnosis Date  . Acute heart failure (HCC)   . ADD (attention deficit disorder)   . Bronchitis   . Depression   . Elevated troponin   . Hypertension   . Obesity     Past Surgical History:  Procedure Laterality Date  . CESAREAN SECTION      Current Outpatient Medications  Medication Sig Dispense Refill  . buPROPion (WELLBUTRIN XL) 150 MG 24 hr tablet Take 1 tablet (150 mg total) by mouth daily. 30 tablet 5  . carvedilol (COREG) 12.5 MG tablet Take 1 tablet (12.5 mg  total) by mouth 2 (two) times daily with a meal. 60 tablet 2  . escitalopram (LEXAPRO) 20 MG tablet Take 1 tablet (20 mg total) by mouth daily. 30 tablet 5  . hydrALAZINE (APRESOLINE) 50 MG tablet TAKE 1/2 TABLET 3 TIMES DAILY 90 tablet 5  . levothyroxine (SYNTHROID) 50 MCG tablet Take  1 tablet (50 mcg total) by mouth daily before breakfast. 30 tablet 2  . metFORMIN (GLUCOPHAGE) 500 MG tablet Take 1 tablet (500 mg total) by mouth daily with breakfast. 90 tablet 3  . potassium chloride SA (KLOR-CON) 20 MEQ tablet Take 1 tablet (20 mEq total) by mouth daily. 30 tablet 1  . sacubitril-valsartan (ENTRESTO) 49-51 MG Take 1 tablet by mouth 2 (two) times daily. 60 tablet 6  . spironolactone (ALDACTONE) 25 MG tablet Take 1 tablet (25 mg total) by mouth daily. 90 tablet 3  . torsemide (DEMADEX) 20 MG tablet Take 2 tablets (40 mg total) by mouth 2 (two) times daily. 120 tablet 2  . traZODone (DESYREL) 50 MG tablet Take 1 tablet by mouth at bedtime as needed.    . Vitamin D, Ergocalciferol, (DRISDOL) 1.25 MG (50000 UNIT) CAPS capsule Take 1 capsule (50,000 Units total) by mouth every 7 (seven) days. 8 capsule 1   No current facility-administered medications for this visit.   Allergies:  Hydroxyzine   Social History: The patient  reports that she has never smoked. She has never used smokeless tobacco. She reports that she does not drink alcohol and does not use drugs.   Family History: The patient's family history includes Heart disease in her father; Hypertension in her mother; Kidney Stones in her mother.   ROS:  Please see the history of present illness. Otherwise, complete review of systems is positive for none.  All other systems are reviewed and negative.   Physical Exam: VS:  There were no vitals taken for this visit., BMI There is no height or weight on file to calculate BMI.  Wt Readings from Last 3 Encounters:  03/18/20 260 lb 6.4 oz (118.1 kg)  03/13/20 (!) 314 lb 6 oz (142.6 kg)  09/03/18 273 lb (123.8 kg)    General: Patient appears comfortable at rest. HEENT: Conjunctiva and lids normal, oropharynx clear with moist mucosa. Neck: Supple, no elevated JVP or carotid bruits, no thyromegaly. Lungs: Clear to auscultation, nonlabored breathing at rest. Cardiac: Regular  rate and rhythm, no S3 or significant systolic murmur, no pericardial rub. Abdomen: Soft, nontender, no hepatomegaly, bowel sounds present, no guarding or rebound. Extremities: No pitting edema, distal pulses 2+. Skin: Warm and dry. Musculoskeletal: No kyphosis. Neuropsychiatric: Alert and oriented x3, affect grossly appropriate.  ECG:  {EKG/Telemetry Strips Reviewed:[id number]}  Recent Labwork: 03/08/2020: B Natriuretic Peptide 699.0 03/09/2020: ALT 9; AST 15; Hemoglobin 13.0; Platelets 228; TSH 7.818 03/14/2020: BUN 18; Creatinine, Ser 1.21; Magnesium 2.0; Potassium 3.1; Sodium 136     Component Value Date/Time   CHOL 206 (H) 09/03/2018 1450   TRIG 198 (H) 09/03/2018 1450   HDL 48 09/03/2018 1450   CHOLHDL 4.3 09/03/2018 1450   LDLCALC 118 (H) 09/03/2018 1450    Other Studies Reviewed Today:  Echo 03/09/2020 1. Very limited images. 2. Left ventricular ejection fraction, by estimation, is approximately 35%. The left ventricle has moderately decreased function. Left ventricular endocardial border not optimally defined to evaluate regional wall motion even with Definity contrast. There is mild left ventricular hypertrophy. Left ventricular diastolic parameters are indeterminate. 3. Right ventricular systolic function is severely reduced. The  right ventricular size is normal. 4. The mitral valve is grossly normal. Trivial mitral valve regurgitation. 5. The aortic valve is tricuspid. There is mild calcification of the aortic valve. Aortic valve regurgitation is not visualized. 6. Unable to estimate CVP.   Assessment and Plan:  No diagnosis found.   Medication Adjustments/Labs and Tests Ordered: Current medicines are reviewed at length with the patient today.  Concerns regarding medicines are outlined above.   Disposition: Follow-up with ***  Signed, Rennis Harding, NP 04/25/2020 12:34 PM    [facility] at [facility] [address] [place], [place], [place]  [address] Phone: [phone]; Fax: [phone]

## 2020-04-26 ENCOUNTER — Ambulatory Visit: Payer: 59 | Admitting: Family Medicine

## 2020-05-02 NOTE — Progress Notes (Deleted)
Referring Provider: Toma Deiters, MD Primary Care Physician:  Toma Deiters, MD Primary Gastroenterologist:  Dr. Jena Gauss  No chief complaint on file.   HPI:   Connie Shepard is a 55 y.o. female presenting today at the request of Hasanaj, Myra Gianotti, MD for consult colonoscopy.  Recommended office visit due to BMI and recent admission with heart failure.  Patient was admitted 11/24-11/29/21 with acute on chronic CHF, anasarca in the setting of medication noncompliance and dietary indiscretion, and hypertensive crisis.  She received IV diuresis and metolazone and was negative over 22 L during hospitalization.  Plan to continue carvedilol, Cozaar, hydralazine, spironolactone, and Demadex at discharge. Echo during hospitalization on 03/09/2020 with LVEF 35%, mild left ventricular hypertrophy, right ventricular systolic function severely reduced.  Follow-up with cardiology 03/18/2020.  She was doing well at that time.  Breathing much improved.  BP under good control.  Plan to stop Cozaar and start Entresto twice daily.  Plan to follow-up in 1 month to further med titrate.  Would also consider right and left heart cath to evaluate left ventricular and right ventricular dysfunction.   She is scheduled for follow-up with cardiology on 05/12/2020.  Today:    Past Medical History:  Diagnosis Date  . Acute heart failure (HCC)   . ADD (attention deficit disorder)   . Bronchitis   . Depression   . Elevated troponin   . Hypertension   . Obesity     Past Surgical History:  Procedure Laterality Date  . CESAREAN SECTION      Current Outpatient Medications  Medication Sig Dispense Refill  . buPROPion (WELLBUTRIN XL) 150 MG 24 hr tablet Take 1 tablet (150 mg total) by mouth daily. 30 tablet 5  . carvedilol (COREG) 12.5 MG tablet Take 1 tablet (12.5 mg total) by mouth 2 (two) times daily with a meal. 60 tablet 2  . escitalopram (LEXAPRO) 20 MG tablet Take 1 tablet (20 mg total) by mouth daily. 30  tablet 5  . hydrALAZINE (APRESOLINE) 50 MG tablet TAKE 1/2 TABLET 3 TIMES DAILY 90 tablet 5  . levothyroxine (SYNTHROID) 50 MCG tablet Take 1 tablet (50 mcg total) by mouth daily before breakfast. 30 tablet 2  . metFORMIN (GLUCOPHAGE) 500 MG tablet Take 1 tablet (500 mg total) by mouth daily with breakfast. 90 tablet 3  . potassium chloride SA (KLOR-CON) 20 MEQ tablet Take 1 tablet (20 mEq total) by mouth daily. 30 tablet 1  . sacubitril-valsartan (ENTRESTO) 49-51 MG Take 1 tablet by mouth 2 (two) times daily. 60 tablet 6  . spironolactone (ALDACTONE) 25 MG tablet Take 1 tablet (25 mg total) by mouth daily. 90 tablet 3  . torsemide (DEMADEX) 20 MG tablet Take 2 tablets (40 mg total) by mouth 2 (two) times daily. 120 tablet 2  . traZODone (DESYREL) 50 MG tablet Take 1 tablet by mouth at bedtime as needed.    . Vitamin D, Ergocalciferol, (DRISDOL) 1.25 MG (50000 UNIT) CAPS capsule Take 1 capsule (50,000 Units total) by mouth every 7 (seven) days. 8 capsule 1   No current facility-administered medications for this visit.    Allergies as of 05/04/2020 - Review Complete 03/18/2020  Allergen Reaction Noted  . Hydroxyzine  12/17/2018    Family History  Problem Relation Age of Onset  . Kidney Stones Mother   . Hypertension Mother   . Heart disease Father     Social History   Socioeconomic History  . Marital status: Married  Spouse name: Not on file  . Number of children: Not on file  . Years of education: Not on file  . Highest education level: Not on file  Occupational History  . Not on file  Tobacco Use  . Smoking status: Never Smoker  . Smokeless tobacco: Never Used  Vaping Use  . Vaping Use: Never used  Substance and Sexual Activity  . Alcohol use: No  . Drug use: No  . Sexual activity: Not on file  Other Topics Concern  . Not on file  Social History Narrative  . Not on file   Social Determinants of Health   Financial Resource Strain: Not on file  Food Insecurity:  Not on file  Transportation Needs: Not on file  Physical Activity: Not on file  Stress: Not on file  Social Connections: Not on file  Intimate Partner Violence: Not on file    Review of Systems: Gen: Denies any fever, chills, fatigue, weight loss, lack of appetite.  CV: Denies chest pain, heart palpitations, peripheral edema, syncope.  Resp: Denies shortness of breath at rest or with exertion. Denies wheezing or cough.  GI: Denies dysphagia or odynophagia. Denies jaundice, hematemesis, fecal incontinence. GU : Denies urinary burning, urinary frequency, urinary hesitancy MS: Denies joint pain, muscle weakness, cramps, or limitation of movement.  Derm: Denies rash, itching, dry skin Psych: Denies depression, anxiety, memory loss, and confusion Heme: Denies bruising, bleeding, and enlarged lymph nodes.  Physical Exam: There were no vitals taken for this visit. General:   Alert and oriented. Pleasant and cooperative. Well-nourished and well-developed.  Head:  Normocephalic and atraumatic. Eyes:  Without icterus, sclera clear and conjunctiva pink.  Ears:  Normal auditory acuity. Nose:  No deformity, discharge,  or lesions. Mouth:  No deformity or lesions, oral mucosa pink.  Neck:  Supple, without mass or thyromegaly. Lungs:  Clear to auscultation bilaterally. No wheezes, rales, or rhonchi. No distress.  Heart:  S1, S2 present without murmurs appreciated.  Abdomen:  +BS, soft, non-tender and non-distended. No HSM noted. No guarding or rebound. No masses appreciated.  Rectal:  Deferred  Msk:  Symmetrical without gross deformities. Normal posture. Pulses:  Normal pulses noted. Extremities:  Without clubbing or edema. Neurologic:  Alert and  oriented x4;  grossly normal neurologically. Skin:  Intact without significant lesions or rashes. Cervical Nodes:  No significant cervical adenopathy. Psych:  Alert and cooperative. Normal mood and affect.

## 2020-05-03 ENCOUNTER — Ambulatory Visit: Payer: 59 | Admitting: Family Medicine

## 2020-05-04 ENCOUNTER — Encounter: Payer: Self-pay | Admitting: Internal Medicine

## 2020-05-04 ENCOUNTER — Ambulatory Visit: Payer: 59 | Admitting: Gastroenterology

## 2020-05-11 NOTE — Progress Notes (Deleted)
Cardiology Office Note  Date: 05/11/2020   ID: Connie Shepard, DOB [birth year], MRN [id number]  PCP:  Connie Deiters, MD  Cardiologist:  Rollene Rotunda, MD Electrophysiologist:  None   Chief Complaint: Follow-up acute systolic heart failure, hypertensive crisis.  History of Present Illness: Connie Shepard is a 55 y.o. female with a history of acute on chronic systolic heart failure, uncontrolled hypertension, morbid obesity, balance and gait changes along with mood changes, poorly controlled DM type II, hypothyroidism, hypokalemia, vitamin D deficiency,  Last encounter with Dr. Antoine Shepard 03/18/2020 for hospital follow-up.  Recent hospitalization 03/09/2020 for acute on chronic systolic heart failure.  Diuresed over 22 pounds.  Had hypertensive crisis.  Previous echocardiogram demonstrating EF of around 35% in 2019.  Follow-up echo in November 2021 demonstrated EF of 35%.  She had not been compliant with medications and diet as well as follow-up medical visits.  Had not been seen since 2019.  She had recently been discharged from the hospital and had not started weighing herself.  She was compliant with her medications.  Was having issues with balance and gait.  She had been working with physical therapy at home.  She described her breathing as better. Her lower extremity edema had improved.  She was continuing carvedilol, Cozaar, hydralazine, spironolactone, Demadex.  Blood pressure had improved and much better controlled.  Recommended initiation of Jardiance as an outpatient due to recent hemoglobin A1c of 8.2% with poorly controlled diabetes.  Severely morbid obesity with BMI of 53.9.  Dr. Antoine Shepard advised patient would benefit from outpatient bariatric clinic referral/evaluation.  Due to balance and mood changes, CT of the head was ordered to rule out any abnormalities.  Vitamin D was low.  Normal B12.  Physical therapy evaluated and recommended rolling walker and home health PT.  She was hypokalemic  and was receiving potassium supplementation.  Basic metabolic panel was ordered.  Initiated high-dose weekly supplementation of vitamin D.  Repeat vitamin D level in 2 months.  Also ordered BMP at follow-up to reassess electrolytes and renal function.  Advise rechecking thyroid panel in 3 months.  Her Cozaar was stopped and Entresto was started at 49/51 mg p.o. twice daily.  Plans were to see her back the next month to further titrate medications.  Also plans were to see her back in a month for consideration of right and left heart catheterization to evaluate left and right ventricular dysfunction.  He advised her to keep a blood pressure diary.  She was to follow-up with PCP for uncontrolled diabetes.  She was sent to nutritionist to talk about weight loss and dietary modifications.   Recent head CT ordered for balance and gait disturbances was negative for acute process. However, patchy confluent areas of decreased attenuation throughout deep and peri-ventricular white matter of cerebral hemispheres bilaterally compatible with chronic microvascular disease. 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   Past Medical History:  Diagnosis Date  . Acute heart failure (HCC)   . ADD (attention deficit disorder)   . Bronchitis   . Depression   . Elevated troponin   . Hypertension   . Obesity     Past Surgical History:  Procedure Laterality Date  . CESAREAN SECTION      Current Outpatient Medications  Medication Sig Dispense Refill  . buPROPion (WELLBUTRIN XL) 150 MG 24 hr tablet Take 1 tablet (150 mg total) by mouth daily. 30 tablet 5  . carvedilol (COREG) 12.5 MG tablet Take 1 tablet (12.5 mg total)  by mouth 2 (two) times daily with a meal. 60 tablet 2  . escitalopram (LEXAPRO) 20 MG tablet Take 1 tablet (20 mg total) by mouth daily. 30 tablet 5  . hydrALAZINE (APRESOLINE) 50 MG tablet TAKE 1/2 TABLET 3 TIMES DAILY 90 tablet 5  . levothyroxine (SYNTHROID) 50 MCG tablet Take 1 tablet  (50 mcg total) by mouth daily before breakfast. 30 tablet 2  . metFORMIN (GLUCOPHAGE) 500 MG tablet Take 1 tablet (500 mg total) by mouth daily with breakfast. 90 tablet 3  . potassium chloride SA (KLOR-CON) 20 MEQ tablet Take 1 tablet (20 mEq total) by mouth daily. 30 tablet 1  . sacubitril-valsartan (ENTRESTO) 49-51 MG Take 1 tablet by mouth 2 (two) times daily. 60 tablet 6  . spironolactone (ALDACTONE) 25 MG tablet Take 1 tablet (25 mg total) by mouth daily. 90 tablet 3  . torsemide (DEMADEX) 20 MG tablet Take 2 tablets (40 mg total) by mouth 2 (two) times daily. 120 tablet 2  . traZODone (DESYREL) 50 MG tablet Take 1 tablet by mouth at bedtime as needed.    . Vitamin D, Ergocalciferol, (DRISDOL) 1.25 MG (50000 UNIT) CAPS capsule Take 1 capsule (50,000 Units total) by mouth every 7 (seven) days. 8 capsule 1   No current facility-administered medications for this visit.   Allergies:  Hydroxyzine   Social History: The patient  reports that she has never smoked. She has never used smokeless tobacco. She reports that she does not drink alcohol and does not use drugs.   Family History: The patient's family history includes Heart disease in her father; Hypertension in her mother; Kidney Stones in her mother.   ROS:  Please see the history of present illness. Otherwise, complete review of systems is positive for none.  All other systems are reviewed and negative.   Physical Exam: VS:  There were no vitals taken for this visit., BMI There is no height or weight on file to calculate BMI.  Wt Readings from Last 3 Encounters:  03/18/20 260 lb 6.4 oz (118.1 kg)  03/13/20 (!) 314 lb 6 oz (142.6 kg)  09/03/18 273 lb (123.8 kg)    General: Patient appears comfortable at rest. HEENT: Conjunctiva and lids normal, oropharynx clear with moist mucosa. Neck: Supple, no elevated JVP or carotid bruits, no thyromegaly. Lungs: Clear to auscultation, nonlabored breathing at rest. Cardiac: Regular rate and  rhythm, no S3 or significant systolic murmur, no pericardial rub. Abdomen: Soft, nontender, no hepatomegaly, bowel sounds present, no guarding or rebound. Extremities: No pitting edema, distal pulses 2+. Skin: Warm and dry. Musculoskeletal: No kyphosis. Neuropsychiatric: Alert and oriented x3, affect grossly appropriate.  ECG:  {EKG/Telemetry Strips Reviewed:[phone]}  Recent Labwork: 03/08/2020: B Natriuretic Peptide 699.0 03/09/2020: ALT 9; AST 15; Hemoglobin 13.0; Platelets 228; TSH 7.818 03/14/2020: BUN 18; Creatinine, Ser 1.21; Magnesium 2.0; Potassium 3.1; Sodium 136     Component Value Date/Time   CHOL 206 (H) 09/03/2018 1450   TRIG 198 (H) 09/03/2018 1450   HDL 48 09/03/2018 1450   CHOLHDL 4.3 09/03/2018 1450   LDLCALC 118 (H) 09/03/2018 1450    Other Studies Reviewed Today:  Echo 03/09/2020 1. Very limited images. 2. Left ventricular ejection fraction, by estimation, is approximately 35%. The left ventricle has moderately decreased function. Left ventricular endocardial border not optimally defined to evaluate regional wall motion even with Definity contrast. There is mild left ventricular hypertrophy. Left ventricular diastolic parameters are indeterminate. 3. Right ventricular systolic function is severely reduced. The right  ventricular size is normal. 4. The mitral valve is grossly normal. Trivial mitral valve regurgitation. 5. The aortic valve is tricuspid. There is mild calcification of the aortic valve. Aortic valve regurgitation is not visualized. 6. Unable to estimate CVP.   Assessment and Plan:  1. Chronic systolic heart failure (HCC)   2. Essential hypertension      Medication Adjustments/Labs and Tests Ordered: Current medicines are reviewed at length with the patient today.  Concerns regarding medicines are outlined above.   Disposition: Follow-up with ***  Signed, Rennis Harding, NP 05/11/2020 8:10 PM    [facility]  at [facility] [address] [place], [place], [place] [address] Phone: [phone]; Fax: [phone]

## 2020-05-12 ENCOUNTER — Ambulatory Visit: Payer: 59 | Admitting: Family Medicine

## 2020-05-12 DIAGNOSIS — I10 Essential (primary) hypertension: Secondary | ICD-10-CM

## 2020-05-12 DIAGNOSIS — I5022 Chronic systolic (congestive) heart failure: Secondary | ICD-10-CM

## 2020-05-13 ENCOUNTER — Ambulatory Visit: Payer: 59 | Admitting: Dietician

## 2020-05-29 NOTE — Progress Notes (Deleted)
Cardiology Office Note  Date: 05/29/2020   ID: Connie Shepard, DOB 1966-02-18, MRN [id number]  PCP:  Toma Deiters, MD  Cardiologist:  Rollene Rotunda, MD Electrophysiologist:  None   Chief Complaint: Follow-up acute systolic heart failure, hypertensive crisis.  History of Present Illness: Connie Shepard is a 55 y.o. female with a history of acute on chronic systolic heart failure, uncontrolled hypertension, morbid obesity, balance and gait changes along with mood changes, poorly controlled DM type II, hypothyroidism, hypokalemia, vitamin D deficiency,  Last encounter with Dr. Antoine Poche 03/18/2020 for hospital follow-up.  Recent hospitalization 03/09/2020 for acute on chronic systolic heart failure.  Diuresed over 22 pounds.  Had hypertensive crisis.  Previous echocardiogram demonstrating EF of around 35% in 2019.  Follow-up echo in November 2021 demonstrated EF of 35%.  She had not been compliant with medications and diet as well as follow-up medical visits.  Had not been seen since 2019.  She had recently been discharged from the hospital and had not started weighing herself.  She was compliant with her medications.  Was having issues with balance and gait.  She had been working with physical therapy at home.  She described her breathing as better. Her lower extremity edema had improved.  She was continuing carvedilol, Cozaar, hydralazine, spironolactone, Demadex.  Blood pressure had improved and much better controlled.  Recommended initiation of Jardiance as an outpatient due to recent hemoglobin A1c of 8.2% with poorly controlled diabetes.  Severely morbid obesity with BMI of 53.9.  Dr. Antoine Poche advised patient would benefit from outpatient bariatric clinic referral/evaluation.  Due to balance and mood changes, CT of the head was ordered to rule out any abnormalities.  Vitamin D was low.  Normal B12.  Physical therapy evaluated and recommended rolling walker and home health PT.  She was hypokalemic  and was receiving potassium supplementation.  Basic metabolic panel was ordered.  Initiated high-dose weekly supplementation of vitamin D.  Repeat vitamin D level in 2 months.  Also ordered BMP at follow-up to reassess electrolytes and renal function.  Advise rechecking thyroid panel in 3 months.  Her Cozaar was stopped and Entresto was started at 49/51 mg p.o. twice daily.  Plans were to see her back the next month to further titrate medications.  Also plans were to see her back in a month for consideration of right and left heart catheterization to evaluate left and right ventricular dysfunction.  He advised her to keep a blood pressure diary.  She was to follow-up with PCP for uncontrolled diabetes.  She was sent to nutritionist to talk about weight loss and dietary modifications.   Recent head CT ordered for balance and gait disturbances was negative for acute process. However, patchy confluent areas of decreased attenuation throughout deep and peri-ventricular white matter of cerebral hemispheres bilaterally compatible with chronic microvascular disease. 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   Plan for left and right heart cath ?  Past Medical History:  Diagnosis Date  . Acute heart failure (HCC)   . ADD (attention deficit disorder)   . Bronchitis   . Depression   . Elevated troponin   . Hypertension   . Obesity     Past Surgical History:  Procedure Laterality Date  . CESAREAN SECTION      Current Outpatient Medications  Medication Sig Dispense Refill  . buPROPion (WELLBUTRIN XL) 150 MG 24 hr tablet Take 1 tablet (150 mg total) by mouth daily. 30 tablet 5  . carvedilol (COREG)  12.5 MG tablet Take 1 tablet (12.5 mg total) by mouth 2 (two) times daily with a meal. 60 tablet 2  . escitalopram (LEXAPRO) 20 MG tablet Take 1 tablet (20 mg total) by mouth daily. 30 tablet 5  . hydrALAZINE (APRESOLINE) 50 MG tablet TAKE 1/2 TABLET 3 TIMES DAILY 90 tablet 5  . levothyroxine  (SYNTHROID) 50 MCG tablet Take 1 tablet (50 mcg total) by mouth daily before breakfast. 30 tablet 2  . metFORMIN (GLUCOPHAGE) 500 MG tablet Take 1 tablet (500 mg total) by mouth daily with breakfast. 90 tablet 3  . potassium chloride SA (KLOR-CON) 20 MEQ tablet Take 1 tablet (20 mEq total) by mouth daily. 30 tablet 1  . sacubitril-valsartan (ENTRESTO) 49-51 MG Take 1 tablet by mouth 2 (two) times daily. 60 tablet 6  . spironolactone (ALDACTONE) 25 MG tablet Take 1 tablet (25 mg total) by mouth daily. 90 tablet 3  . torsemide (DEMADEX) 20 MG tablet Take 2 tablets (40 mg total) by mouth 2 (two) times daily. 120 tablet 2  . traZODone (DESYREL) 50 MG tablet Take 1 tablet by mouth at bedtime as needed.    . Vitamin D, Ergocalciferol, (DRISDOL) 1.25 MG (50000 UNIT) CAPS capsule Take 1 capsule (50,000 Units total) by mouth every 7 (seven) days. 8 capsule 1   No current facility-administered medications for this visit.   Allergies:  Hydroxyzine   Social History: The patient  reports that she has never smoked. She has never used smokeless tobacco. She reports that she does not drink alcohol and does not use drugs.   Family History: The patient's family history includes Heart disease in her father; Hypertension in her mother; Kidney Stones in her mother.   ROS:  Please see the history of present illness. Otherwise, complete review of systems is positive for none.  All other systems are reviewed and negative.   Physical Exam: VS:  There were no vitals taken for this visit., BMI There is no height or weight on file to calculate BMI.  Wt Readings from Last 3 Encounters:  03/18/20 260 lb 6.4 oz (118.1 kg)  03/13/20 (!) 314 lb 6 oz (142.6 kg)  09/03/18 273 lb (123.8 kg)    General: Patient appears comfortable at rest. HEENT: Conjunctiva and lids normal, oropharynx clear with moist mucosa. Neck: Supple, no elevated JVP or carotid bruits, no thyromegaly. Lungs: Clear to auscultation, nonlabored  breathing at rest. Cardiac: Regular rate and rhythm, no S3 or significant systolic murmur, no pericardial rub. Abdomen: Soft, nontender, no hepatomegaly, bowel sounds present, no guarding or rebound. Extremities: No pitting edema, distal pulses 2+. Skin: Warm and dry. Musculoskeletal: No kyphosis. Neuropsychiatric: Alert and oriented x3, affect grossly appropriate.  ECG:  {EKG/Telemetry Strips [phone]}  Recent Labwork: 03/08/2020: B Natriuretic Peptide 699.0 03/09/2020: ALT 9; AST 15; Hemoglobin 13.0; Platelets 228; TSH 7.818 03/14/2020: BUN 18; Creatinine, Ser 1.21; Magnesium 2.0; Potassium 3.1; Sodium 136     Component Value Date/Time   CHOL 206 (H) 09/03/2018 1450   TRIG 198 (H) 09/03/2018 1450   HDL 48 09/03/2018 1450   CHOLHDL 4.3 09/03/2018 1450   LDLCALC 118 (H) 09/03/2018 1450    Other Studies Reviewed Today:  Echo 03/09/2020 1. Very limited images. 2. Left ventricular ejection fraction, by estimation, is approximately 35%. The left ventricle has moderately decreased function. Left ventricular endocardial border not optimally defined to evaluate regional wall motion even with Definity contrast. There is mild left ventricular hypertrophy. Left ventricular diastolic parameters are indeterminate. 3.  Right ventricular systolic function is severely reduced. The right ventricular size is normal. 4. The mitral valve is grossly normal. Trivial mitral valve regurgitation. 5. The aortic valve is tricuspid. There is mild calcification of the aortic valve. Aortic valve regurgitation is not visualized. 6. Unable to estimate CVP.   Assessment and Plan:  1. HFrEF (heart failure with reduced ejection fraction) (HCC)   2. Uncontrolled hypertension   3. Morbid obesity with body mass index of 50.0-59.9 in adult [facility])      Medication Adjustments/Labs and Tests Ordered: Current medicines are reviewed at length with the patient today.  Concerns regarding medicines are  outlined above.   Disposition: Follow-up with ***  Signed, Rennis Harding, NP 05/29/2020 8:41 PM    [facility] at [facility] [address] [place], [place], [place] [address] Phone: [phone]; Fax: [phone]

## 2020-05-30 ENCOUNTER — Ambulatory Visit: Payer: 59 | Admitting: Family Medicine

## 2020-05-30 DIAGNOSIS — I10 Essential (primary) hypertension: Secondary | ICD-10-CM

## 2020-05-30 DIAGNOSIS — I5020 Unspecified systolic (congestive) heart failure: Secondary | ICD-10-CM

## 2020-06-10 ENCOUNTER — Ambulatory Visit: Payer: 59 | Admitting: Dietician

## 2020-06-27 ENCOUNTER — Emergency Department (HOSPITAL_COMMUNITY): Payer: 59

## 2020-06-27 ENCOUNTER — Emergency Department (HOSPITAL_COMMUNITY)
Admission: EM | Admit: 2020-06-27 | Discharge: 2020-06-28 | Disposition: A | Discharge status: Home / Self Care | Payer: 59 | Attending: Emergency Medicine | Admitting: Emergency Medicine

## 2020-06-27 ENCOUNTER — Encounter (HOSPITAL_COMMUNITY): Payer: Self-pay | Admitting: *Deleted

## 2020-06-27 ENCOUNTER — Other Ambulatory Visit: Payer: Self-pay

## 2020-06-27 DIAGNOSIS — Z7984 Long term (current) use of oral hypoglycemic drugs: Secondary | ICD-10-CM | POA: Diagnosis not present

## 2020-06-27 DIAGNOSIS — R1031 Right lower quadrant pain: Secondary | ICD-10-CM | POA: Diagnosis not present

## 2020-06-27 DIAGNOSIS — Z79899 Other long term (current) drug therapy: Secondary | ICD-10-CM | POA: Diagnosis not present

## 2020-06-27 DIAGNOSIS — R112 Nausea with vomiting, unspecified: Secondary | ICD-10-CM | POA: Diagnosis not present

## 2020-06-27 DIAGNOSIS — E876 Hypokalemia: Secondary | ICD-10-CM

## 2020-06-27 DIAGNOSIS — E039 Hypothyroidism, unspecified: Secondary | ICD-10-CM | POA: Diagnosis not present

## 2020-06-27 DIAGNOSIS — Z20822 Contact with and (suspected) exposure to covid-19: Secondary | ICD-10-CM | POA: Insufficient documentation

## 2020-06-27 DIAGNOSIS — R109 Unspecified abdominal pain: Secondary | ICD-10-CM | POA: Diagnosis present

## 2020-06-27 DIAGNOSIS — I5023 Acute on chronic systolic (congestive) heart failure: Secondary | ICD-10-CM | POA: Diagnosis not present

## 2020-06-27 DIAGNOSIS — E119 Type 2 diabetes mellitus without complications: Secondary | ICD-10-CM | POA: Diagnosis not present

## 2020-06-27 DIAGNOSIS — I110 Hypertensive heart disease with heart failure: Secondary | ICD-10-CM | POA: Diagnosis not present

## 2020-06-27 DIAGNOSIS — I10 Essential (primary) hypertension: Secondary | ICD-10-CM

## 2020-06-27 DIAGNOSIS — T502X5A Adverse effect of carbonic-anhydrase inhibitors, benzothiadiazides and other diuretics, initial encounter: Secondary | ICD-10-CM

## 2020-06-27 HISTORY — DX: Type 2 diabetes mellitus without complications: E11.9

## 2020-06-27 LAB — COMPREHENSIVE METABOLIC PANEL WITH GFR
ALT: 23 U/L (ref 0–44)
AST: 21 U/L (ref 15–41)
Albumin: 3 g/dL — ABNORMAL LOW (ref 3.5–5.0)
Alkaline Phosphatase: 60 U/L (ref 38–126)
Anion gap: 14 (ref 5–15)
BUN: 12 mg/dL (ref 6–20)
CO2: 32 mmol/L (ref 22–32)
Calcium: 8.1 mg/dL — ABNORMAL LOW (ref 8.9–10.3)
Chloride: 95 mmol/L — ABNORMAL LOW (ref 98–111)
Creatinine, Ser: 1.18 mg/dL — ABNORMAL HIGH (ref 0.44–1.00)
GFR, Estimated: 55 mL/min — ABNORMAL LOW
Glucose, Bld: 140 mg/dL — ABNORMAL HIGH (ref 70–99)
Potassium: 3 mmol/L — ABNORMAL LOW (ref 3.5–5.1)
Sodium: 141 mmol/L (ref 135–145)
Total Bilirubin: 0.9 mg/dL (ref 0.3–1.2)
Total Protein: 6.4 g/dL — ABNORMAL LOW (ref 6.5–8.1)

## 2020-06-27 LAB — CBC WITH DIFFERENTIAL/PLATELET
Abs Immature Granulocytes: 0.01 K/uL (ref 0.00–0.07)
Basophils Absolute: 0.1 K/uL (ref 0.0–0.1)
Basophils Relative: 1 %
Eosinophils Absolute: 0.1 K/uL (ref 0.0–0.5)
Eosinophils Relative: 2 %
HCT: 44 % (ref 36.0–46.0)
Hemoglobin: 13.4 g/dL (ref 12.0–15.0)
Immature Granulocytes: 0 %
Lymphocytes Relative: 22 %
Lymphs Abs: 1.3 K/uL (ref 0.7–4.0)
MCH: 31.7 pg (ref 26.0–34.0)
MCHC: 30.5 g/dL (ref 30.0–36.0)
MCV: 104 fL — ABNORMAL HIGH (ref 80.0–100.0)
Monocytes Absolute: 0.5 K/uL (ref 0.1–1.0)
Monocytes Relative: 9 %
Neutro Abs: 4.1 K/uL (ref 1.7–7.7)
Neutrophils Relative %: 66 %
Platelets: 188 K/uL (ref 150–400)
RBC: 4.23 MIL/uL (ref 3.87–5.11)
RDW: 16.4 % — ABNORMAL HIGH (ref 11.5–15.5)
WBC: 6.2 K/uL (ref 4.0–10.5)
nRBC: 0 % (ref 0.0–0.2)

## 2020-06-27 IMAGING — CT CT ABD-PELV W/ CM
2 of 5 series · 16 of 46 positions shown, 18 images · IV contrast (Omnipaque or Isovue)
Comparison: CT abdomen pelvis [DATE]

CLINICAL DATA: RLQ abdominal pain, appendicitis suspecte

EXAM:
CT ABDOMEN AND PELVIS WITH CONTRAST
TECHNIQUE: Multidetector CT imaging of the abdomen and pelvis was performed
using the standard protocol following bolus administration of
intravenous contrast.
CONTRAST:  100mL OMNIPAQUE IOHEXOL 300 MG/ML  SOLN

[Series 2: axial st · axial · 0.98mm/px · z∈[+1118,+1534]mm · 13 of 95 slices shown, 15 images]
[im 6/95  soft-tissue]
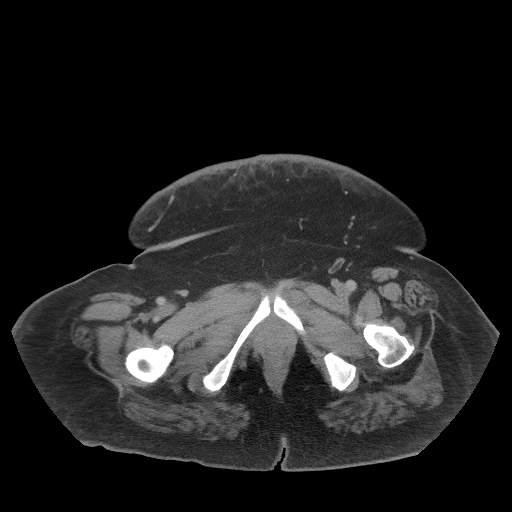
[im 6/95  bone]
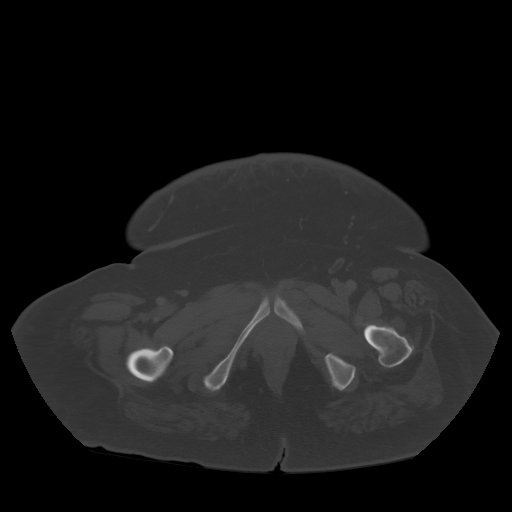
[im 12/95  soft-tissue]
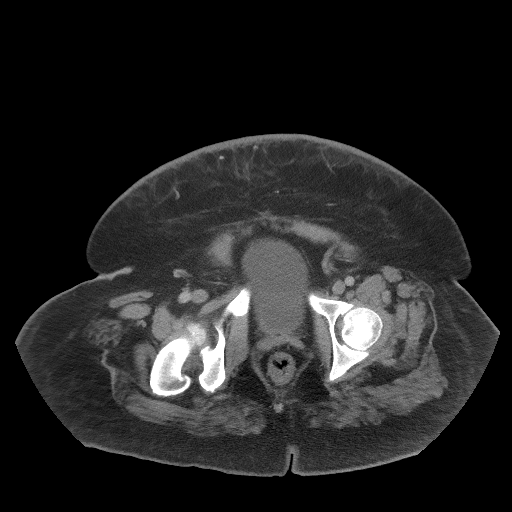
[im 23/95  soft-tissue]
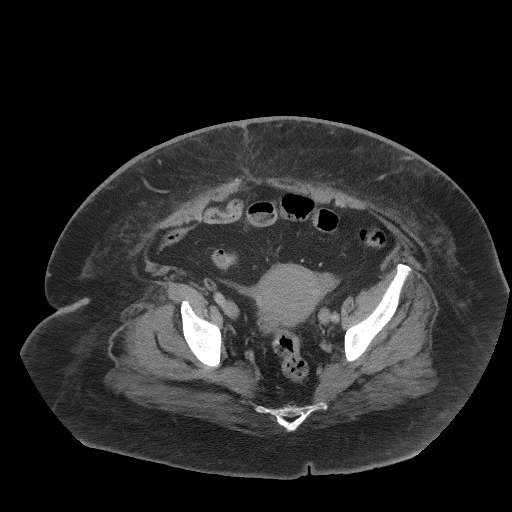
[im 28/95  soft-tissue]
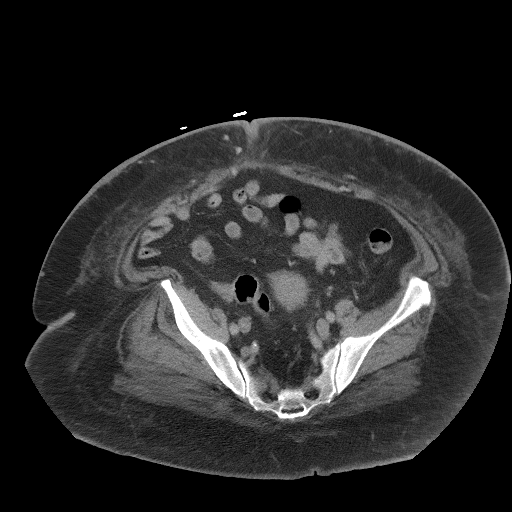
[im 34/95  soft-tissue]
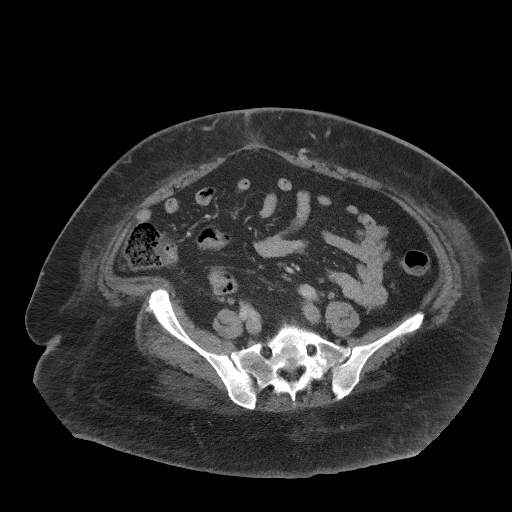
[im 39/95  soft-tissue]
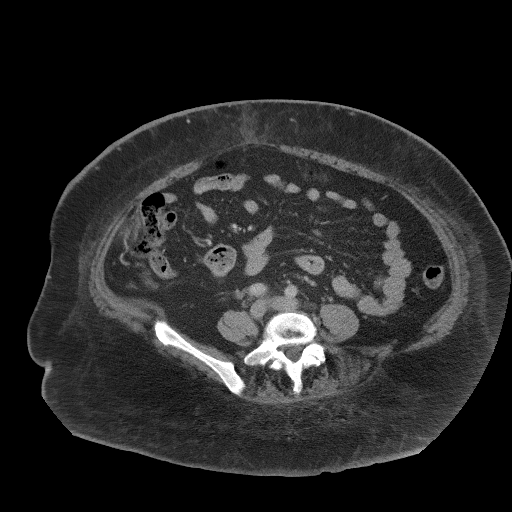
[im 50/95  soft-tissue]
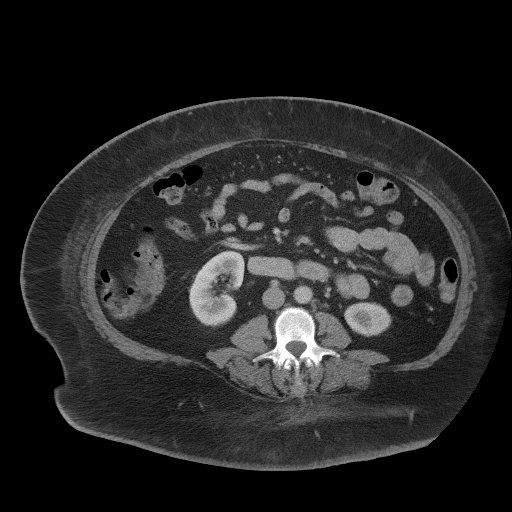
[im 56/95  soft-tissue]
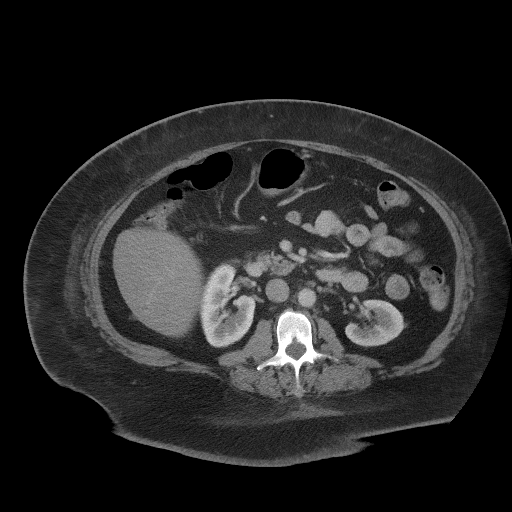
[im 61/95  soft-tissue]
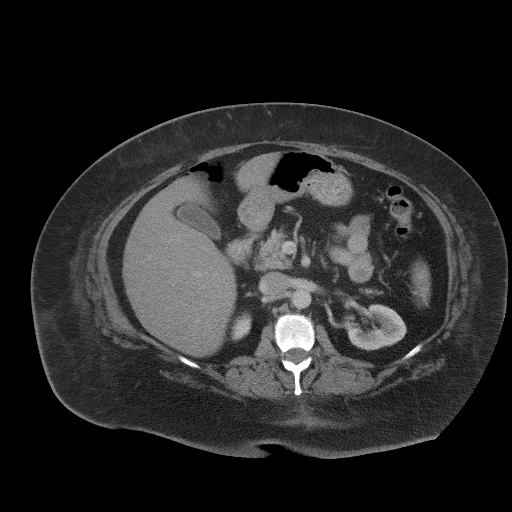
[im 61/95  bone]
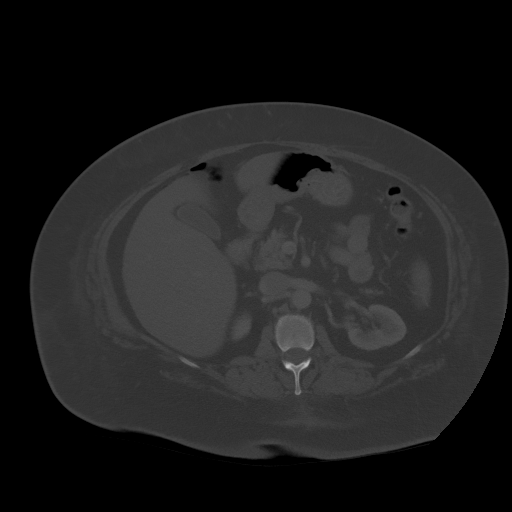
[im 67/95  soft-tissue]
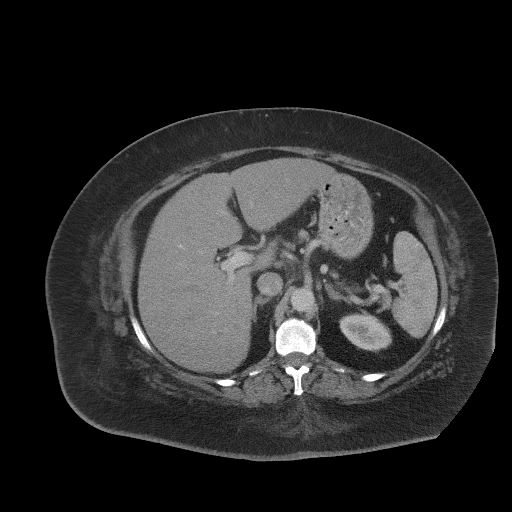
[im 72/95  soft-tissue]
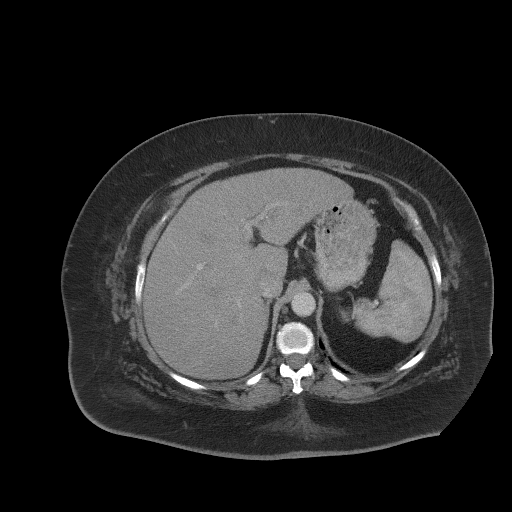
[im 83/95  soft-tissue]
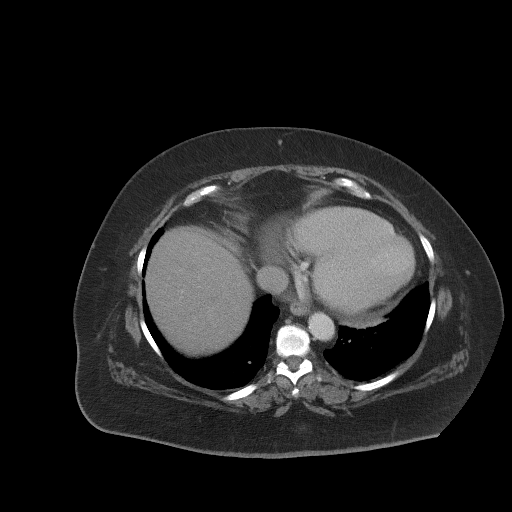
[im 89/95  soft-tissue]
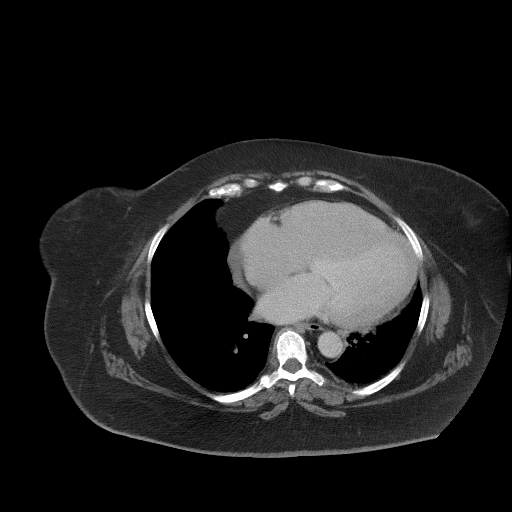

[Series 5: coronal st · coronal · 0.82mm/px · 3 of 117 slices shown]
[im 39/117  soft-tissue]
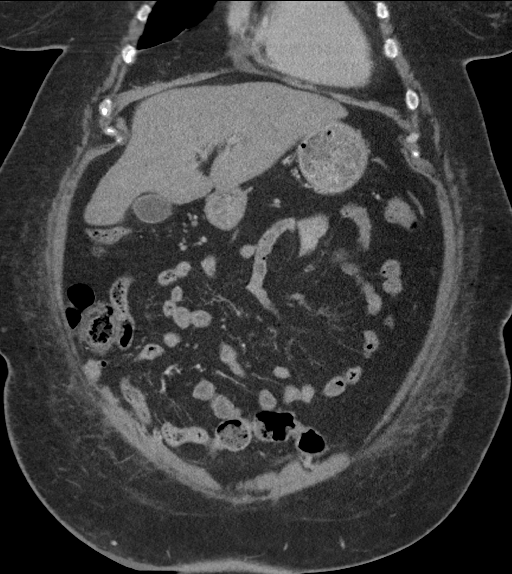
[im 52/117  soft-tissue]
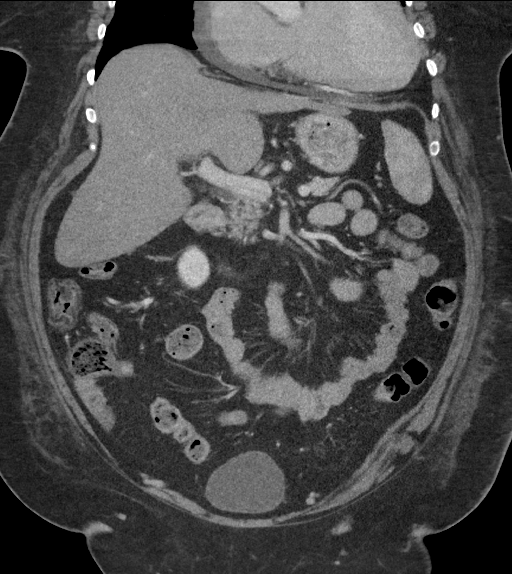
[im 65/117  soft-tissue]
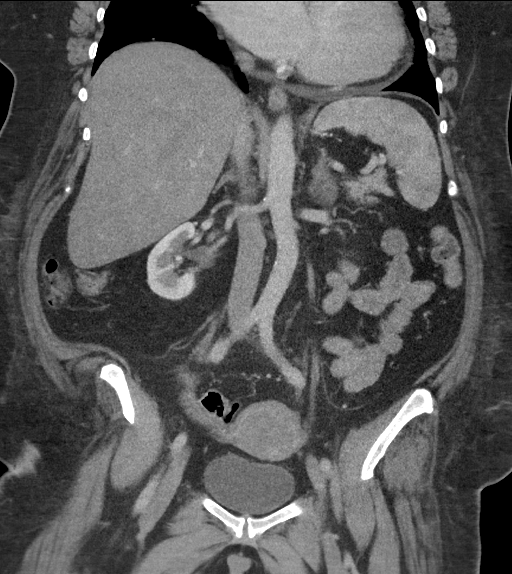

[16 of 46 positions shown; findings below may reference images not displayed]

FINDINGS: Lower chest: No acute abnormality.

Hepatobiliary: No focal liver abnormality. No gallstones,
gallbladder wall thickening, or pericholecystic fluid. No biliary
dilatation.

Pancreas: No focal lesion. Normal pancreatic contour. No surrounding
inflammatory changes. No main pancreatic ductal dilatation.

Spleen: Normal in size without focal abnormality.

Adrenals/Urinary Tract: No adrenal nodule bilaterally. Bilateral
kidneys enhance symmetrically. No hydronephrosis. No hydroureter.
The urinary bladder is unremarkable.

Stomach/Bowel: Stomach is within normal limits. No evidence of bowel
wall thickening or dilatation. Appendix appears normal.

Vascular/Lymphatic: No abdominal aorta or iliac aneurysm. Mild
atherosclerotic plaque of the aorta and its branches. No abdominal,
pelvic, or inguinal lymphadenopathy.

Reproductive: Uterus and bilateral adnexa are unremarkable.

Other: No intraperitoneal free fluid. No intraperitoneal free gas.
No organized fluid collection.

Musculoskeletal:

Subcutaneus soft tissue edema. Otherwise no abdominal wall hernia or
abnormality.

No suspicious lytic or blastic osseous lesions. No acute displaced
fracture. L4-L5 and L5-S1 degenerative changes of the spine.
IMPRESSION: Normal appendix.  No acute intra-abdominal intrapelvic abnormality.

## 2020-06-27 MED ORDER — IOHEXOL 300 MG/ML  SOLN
100.0000 mL | Freq: Once | INTRAMUSCULAR | Status: AC | PRN
  Administered 2020-06-27: 100 mL via INTRAVENOUS

## 2020-06-27 MED ORDER — ONDANSETRON HCL 4 MG/2ML IJ SOLN
4.0000 mg | Freq: Once | INTRAMUSCULAR | Status: AC
  Administered 2020-06-27: 4 mg via INTRAVENOUS
  Filled 2020-06-27: qty 2

## 2020-06-27 NOTE — ED Provider Notes (Signed)
[facility] EMERGENCY DEPARTMENT Provider Note   CSN: [id number] Arrival date & time: 06/27/20  2151     History Chief Complaint  Patient presents with  . Abdominal Pain    Connie Shepard is a 55 y.o. female.   Abdominal Pain Associated symptoms: nausea and vomiting   Associated symptoms: no chest pain, no dysuria and no shortness of breath   Patient abdominal pain.  About 3 days.  Has had nausea and dry heaving.  No diarrhea constipation.  No fevers.  Pain is somewhat sharp.  No urinary symptoms.  No fevers.  No sick contacts.  No blood in the emesis.  Pain is worse with movements to.  Decreased appetite.    Past Medical History:  Diagnosis Date  . Acute heart failure (HCC)   . ADD (attention deficit disorder)   . Bronchitis   . Depression   . Diabetes mellitus without complication (HCC)   . Elevated troponin   . Hypertension   . Obesity     Patient Active Problem List   Diagnosis Date Noted  . Type 2 diabetes mellitus with complication, without long-term current use of insulin (HCC) 03/16/2020  . Anasarca   . Diabetes mellitus (HCC)   . Hypokalemia   . Vitamin D deficiency   . Hypothyroidism   . Physical deconditioning   . Acute on chronic systolic CHF (congestive heart failure) (HCC) 03/09/2020  . Acute on chronic systolic HF (heart failure) (HCC) 03/09/2020  . Hyperglycemia 12/22/2018  . Hypertensive crisis 04/04/2018  . Acute CHF (congestive heart failure) (HCC) 04/04/2018  . Mild renal insufficiency 04/04/2018  . Elevated troponin 04/04/2018  . Bronchitis   . Obesity   . Seborrheic dermatitis of scalp 05/23/2016  . Essential hypertension 12/02/2015  . Depression 12/02/2015  . Primary insomnia 12/02/2015  . Morbid obesity with body mass index of 50.0-59.9 in adult (HCC) 12/02/2015  . Attention deficit hyperactivity disorder (ADHD), predominantly inattentive type 12/02/2015    Past Surgical History:  Procedure Laterality Date  . CESAREAN SECTION        OB History   No obstetric history on file.     Family History  Problem Relation Age of Onset  . Kidney Stones Mother   . Hypertension Mother   . Heart disease Father     Social History   Tobacco Use  . Smoking status: Never Smoker  . Smokeless tobacco: Never Used  Vaping Use  . Vaping Use: Never used  Substance Use Topics  . Alcohol use: No  . Drug use: No    Home Medications Prior to Admission medications   Medication Sig Start Date End Date Taking? Authorizing Provider  buPROPion (WELLBUTRIN XL) 150 MG 24 hr tablet Take 1 tablet (150 mg total) by mouth daily. 12/17/18   Remus Loffler, PA-C  carvedilol (COREG) 12.5 MG tablet Take 1 tablet (12.5 mg total) by mouth 2 (two) times daily with a meal. 03/14/20   Vassie Loll, MD  escitalopram (LEXAPRO) 20 MG tablet Take 1 tablet (20 mg total) by mouth daily. 12/17/18   Remus Loffler, PA-C  hydrALAZINE (APRESOLINE) 50 MG tablet TAKE 1/2 TABLET 3 TIMES DAILY 09/03/18   Remus Loffler, PA-C  levothyroxine (SYNTHROID) 50 MCG tablet Take 1 tablet (50 mcg total) by mouth daily before breakfast. 03/15/20   Vassie Loll, MD  metFORMIN (GLUCOPHAGE) 500 MG tablet Take 1 tablet (500 mg total) by mouth daily with breakfast. 12/17/18   Remus Loffler, PA-C  potassium  chloride SA (KLOR-CON) 20 MEQ tablet Take 1 tablet (20 mEq total) by mouth daily. 03/14/20   Vassie Loll, MD  sacubitril-valsartan (ENTRESTO) 49-51 MG Take 1 tablet by mouth 2 (two) times daily. 03/19/20   Rollene Rotunda, MD  spironolactone (ALDACTONE) 25 MG tablet Take 1 tablet (25 mg total) by mouth daily. 12/17/18   Remus Loffler, PA-C  torsemide (DEMADEX) 20 MG tablet Take 2 tablets (40 mg total) by mouth 2 (two) times daily. 03/14/20   Vassie Loll, MD  traZODone (DESYREL) 50 MG tablet Take 1 tablet by mouth at bedtime as needed. 03/16/20   [provider]  Vitamin D, Ergocalciferol, (DRISDOL) 1.25 MG (50000 UNIT) CAPS capsule Take 1 capsule (50,000 Units  total) by mouth every 7 (seven) days. 03/20/20   Vassie Loll, MD    Allergies    Hydroxyzine  Review of Systems   Review of Systems  Constitutional: Negative for appetite change.  Respiratory: Negative for shortness of breath.   Cardiovascular: Negative for chest pain.  Gastrointestinal: Positive for abdominal pain, nausea and vomiting.  Genitourinary: Negative for dysuria.  Musculoskeletal: Negative for back pain.  Skin: Negative for rash.  Neurological: Negative for weakness.  Psychiatric/Behavioral: Negative for confusion.    Physical Exam Updated Vital Signs BP (!) 149/123 (BP Location: Right Arm)   Pulse (!) 52   Temp 98.1 F (36.7 C) (Oral)   Resp 18   Ht 5' 4.5" (1.638 m)   SpO2 99%   BMI 44.01 kg/m   Physical Exam Vitals and nursing note reviewed.  Constitutional:      Appearance: She is obese.  HENT:     Head: Normocephalic.  Pulmonary:     Breath sounds: No wheezing, rhonchi or rales.  Abdominal:     Hernia: No hernia is present.     Comments: Moderate right lower quadrant tenderness.  No rebound but mild guarding.  No hernia palpated.  Skin:    General: Skin is warm.     Capillary Refill: Capillary refill takes less than 2 seconds.  Neurological:     Mental Status: She is alert and oriented to person, place, and time.     ED Results / Procedures / Treatments   Labs (all labs ordered are listed, but only abnormal results are displayed) Labs Reviewed  COMPREHENSIVE METABOLIC PANEL - Abnormal; Notable for the following components:      Result Value   Potassium 3.0 (*)    Chloride 95 (*)    Glucose, Bld 140 (*)    Creatinine, Ser 1.18 (*)    Calcium 8.1 (*)    Total Protein 6.4 (*)    Albumin 3.0 (*)    GFR, Estimated 55 (*)    All other components within normal limits  CBC WITH DIFFERENTIAL/PLATELET - Abnormal; Notable for the following components:   MCV 104.0 (*)    RDW 16.4 (*)    All other components within normal limits  RESP PANEL BY  RT-PCR (FLU A&B, COVID) ARPGX2    EKG None  Radiology No results found.  Procedures Procedures   Medications Ordered in ED Medications  ondansetron (ZOFRAN) injection 4 mg (4 mg Intravenous Given 06/27/20 2311)    ED Course  I have reviewed the triage vital signs and the nursing notes.  Pertinent labs & imaging results that were available during my care of the patient were reviewed by me and considered in my medical decision making (see chart for details).    MDM Rules/Calculators/A&P  Patient with abdominal pain.  Right lower quadrant.  Has had nausea and vomiting.  White count reassuring.  CMP and urinalysis pending.  Does have known ejection fraction of around 35%. Differential diagnosis includes bowel obstruction, appendicitis, gastroenteritis. Care turned over to Dr. Preston Fleeting Final Clinical Impression(s) / ED Diagnoses Final diagnoses:  None    Rx / DC Orders ED Discharge Orders    None       Benjiman Core, MD 06/27/20 2324

## 2020-06-27 NOTE — ED Triage Notes (Signed)
Pt with abd pain for past 3 days with nausea and dry heaves.

## 2020-06-27 NOTE — ED Provider Notes (Signed)
Care assumed from Dr. Rubin Payor, patient with right lower quadrant pain, normal WBC with metabolic panel pending and CT abdomen and pelvis pending.  CT of abdomen and pelvis shows no acute process, normal appendix.  Potassium has come back at 3.0 and she is given oral potassium.  It is noted that she is on potassium supplementation as well as a potassium sparing diuretic.  Dose of the potassium supplement will be increased, but she will need to follow-up with her PCP to make sure she does not develop hyperkalemia.  Results for orders placed or performed during the hospital encounter of 06/27/20  Resp Panel by RT-PCR (Flu A&B, Covid) Nasopharyngeal Swab   Specimen: Nasopharyngeal Swab; Nasopharyngeal(NP) swabs in vial transport medium  Result Value Ref Range   SARS Coronavirus 2 by RT PCR NEGATIVE NEGATIVE   Influenza A by PCR NEGATIVE NEGATIVE   Influenza B by PCR NEGATIVE NEGATIVE  Comprehensive metabolic panel  Result Value Ref Range   Sodium 141 135 - 145 mmol/L   Potassium 3.0 (L) 3.5 - 5.1 mmol/L   Chloride 95 (L) 98 - 111 mmol/L   CO2 32 22 - 32 mmol/L   Glucose, Bld 140 (H) 70 - 99 mg/dL   BUN 12 6 - 20 mg/dL   Creatinine, Ser 4.40 (H) 0.44 - 1.00 mg/dL   Calcium 8.1 (L) 8.9 - 10.3 mg/dL   Total Protein 6.4 (L) 6.5 - 8.1 g/dL   Albumin 3.0 (L) 3.5 - 5.0 g/dL   AST 21 15 - 41 U/L   ALT 23 0 - 44 U/L   Alkaline Phosphatase 60 38 - 126 U/L   Total Bilirubin 0.9 0.3 - 1.2 mg/dL   GFR, Estimated 55 (L) >60 mL/min   Anion gap 14 5 - 15  CBC with Differential  Result Value Ref Range   WBC 6.2 4.0 - 10.5 K/uL   RBC 4.23 3.87 - 5.11 MIL/uL   Hemoglobin 13.4 12.0 - 15.0 g/dL   HCT 10.2 72.5 - 36.6 %   MCV 104.0 (H) 80.0 - 100.0 fL   MCH 31.7 26.0 - 34.0 pg   MCHC 30.5 30.0 - 36.0 g/dL   RDW 44.0 (H) 34.7 - 42.5 %   Platelets 188 150 - 400 K/uL   nRBC 0.0 0.0 - 0.2 %   Neutrophils Relative % 66 %   Neutro Abs 4.1 1.7 - 7.7 K/uL   Lymphocytes Relative 22 %   Lymphs Abs 1.3 0.7  - 4.0 K/uL   Monocytes Relative 9 %   Monocytes Absolute 0.5 0.1 - 1.0 K/uL   Eosinophils Relative 2 %   Eosinophils Absolute 0.1 0.0 - 0.5 K/uL   Basophils Relative 1 %   Basophils Absolute 0.1 0.0 - 0.1 K/uL   Immature Granulocytes 0 %   Abs Immature Granulocytes 0.01 0.00 - 0.07 K/uL   CT ABDOMEN PELVIS W CONTRAST  Result Date: 06/28/2020 CLINICAL DATA:  RLQ abdominal pain, appendicitis suspecte EXAM: CT ABDOMEN AND PELVIS WITH CONTRAST TECHNIQUE: Multidetector CT imaging of the abdomen and pelvis was performed using the standard protocol following bolus administration of intravenous contrast. CONTRAST:  OMNIPAQUE IOHEXOL 300 MG/ML  SOLN COMPARISON:  CT abdomen pelvis 03/19/2010 FINDINGS: Lower chest: No acute abnormality. Hepatobiliary: No focal liver abnormality. No gallstones, gallbladder wall thickening, or pericholecystic fluid. No biliary dilatation. Pancreas: No focal lesion. Normal pancreatic contour. No surrounding inflammatory changes. No main pancreatic ductal dilatation. Spleen: Normal in size without focal abnormality. Adrenals/Urinary Tract: No adrenal  nodule bilaterally. Bilateral kidneys enhance symmetrically. No hydronephrosis. No hydroureter. The urinary bladder is unremarkable. Stomach/Bowel: Stomach is within normal limits. No evidence of bowel wall thickening or dilatation. Appendix appears normal. Vascular/Lymphatic: No abdominal aorta or iliac aneurysm. Mild atherosclerotic plaque of the aorta and its branches. No abdominal, pelvic, or inguinal lymphadenopathy. Reproductive: Uterus and bilateral adnexa are unremarkable. Other: No intraperitoneal free fluid. No intraperitoneal free gas. No organized fluid collection. Musculoskeletal: Subcutaneus soft tissue edema. Otherwise no abdominal wall hernia or abnormality. No suspicious lytic or blastic osseous lesions. No acute displaced fracture. L4-L5 and L5-S1 degenerative changes of the spine. IMPRESSION: Normal appendix.  No  acute intra-abdominal intrapelvic abnormality. Electronically Signed   By: Tish Frederickson M.D.   On: 06/28/2020 00:12      Dione Booze, MD 06/28/20 [phone]

## 2020-06-28 DIAGNOSIS — R1031 Right lower quadrant pain: Secondary | ICD-10-CM | POA: Diagnosis not present

## 2020-06-28 LAB — RESP PANEL BY RT-PCR (FLU A&B, COVID) ARPGX2
Influenza A by PCR: NEGATIVE
Influenza B by PCR: NEGATIVE
SARS Coronavirus 2 by RT PCR: NEGATIVE

## 2020-06-28 MED ORDER — POTASSIUM CHLORIDE CRYS ER 20 MEQ PO TBCR
40.0000 meq | EXTENDED_RELEASE_TABLET | Freq: Once | ORAL | Status: AC
  Administered 2020-06-28: 40 meq via ORAL
  Filled 2020-06-28: qty 2

## 2020-06-28 MED ORDER — POTASSIUM CHLORIDE CRYS ER 20 MEQ PO TBCR
20.0000 meq | EXTENDED_RELEASE_TABLET | Freq: Two times a day (BID) | ORAL | 0 refills | Status: DC
Start: 2020-06-28 — End: 2024-01-29

## 2020-06-28 NOTE — Discharge Instructions (Addendum)
Your blood work and CT scan did not show any serious causes for your pain.  Your potassium level came back low, you need to take extra potassium to try to bring it back up.  You need to see your primary care provider in 1 week to recheck the potassium level to make sure it is not getting too high.  Return to the emergency department if symptoms are getting worse.

## 2020-08-24 ENCOUNTER — Other Ambulatory Visit: Payer: Self-pay

## 2020-08-24 ENCOUNTER — Emergency Department (HOSPITAL_COMMUNITY): Payer: 59

## 2020-08-24 ENCOUNTER — Inpatient Hospital Stay (HOSPITAL_COMMUNITY)
Admission: EM | Admit: 2020-08-24 | Discharge: 2020-09-07 | DRG: 064 | Disposition: A | Discharge status: Skilled Nursing Facility | Payer: 59 | Attending: Internal Medicine | Admitting: Internal Medicine

## 2020-08-24 ENCOUNTER — Encounter (HOSPITAL_COMMUNITY): Payer: Self-pay | Admitting: Emergency Medicine

## 2020-08-24 DIAGNOSIS — Z7984 Long term (current) use of oral hypoglycemic drugs: Secondary | ICD-10-CM | POA: Diagnosis not present

## 2020-08-24 DIAGNOSIS — R0902 Hypoxemia: Secondary | ICD-10-CM | POA: Diagnosis present

## 2020-08-24 DIAGNOSIS — R29810 Facial weakness: Secondary | ICD-10-CM | POA: Diagnosis present

## 2020-08-24 DIAGNOSIS — R9431 Abnormal electrocardiogram [ECG] [EKG]: Secondary | ICD-10-CM | POA: Diagnosis present

## 2020-08-24 DIAGNOSIS — I110 Hypertensive heart disease with heart failure: Secondary | ICD-10-CM | POA: Diagnosis present

## 2020-08-24 DIAGNOSIS — M545 Low back pain, unspecified: Secondary | ICD-10-CM | POA: Diagnosis present

## 2020-08-24 DIAGNOSIS — I5042 Chronic combined systolic (congestive) and diastolic (congestive) heart failure: Secondary | ICD-10-CM | POA: Diagnosis present

## 2020-08-24 DIAGNOSIS — G4700 Insomnia, unspecified: Secondary | ICD-10-CM | POA: Diagnosis present

## 2020-08-24 DIAGNOSIS — I6381 Other cerebral infarction due to occlusion or stenosis of small artery: Secondary | ICD-10-CM | POA: Diagnosis present

## 2020-08-24 DIAGNOSIS — I639 Cerebral infarction, unspecified: Secondary | ICD-10-CM | POA: Diagnosis not present

## 2020-08-24 DIAGNOSIS — I69351 Hemiplegia and hemiparesis following cerebral infarction affecting right dominant side: Secondary | ICD-10-CM

## 2020-08-24 DIAGNOSIS — Z6841 Body Mass Index (BMI) 40.0 and over, adult: Secondary | ICD-10-CM | POA: Diagnosis not present

## 2020-08-24 DIAGNOSIS — F32A Depression, unspecified: Secondary | ICD-10-CM | POA: Diagnosis present

## 2020-08-24 DIAGNOSIS — Z79899 Other long term (current) drug therapy: Secondary | ICD-10-CM | POA: Diagnosis not present

## 2020-08-24 DIAGNOSIS — E118 Type 2 diabetes mellitus with unspecified complications: Secondary | ICD-10-CM | POA: Diagnosis not present

## 2020-08-24 DIAGNOSIS — G9341 Metabolic encephalopathy: Secondary | ICD-10-CM | POA: Diagnosis not present

## 2020-08-24 DIAGNOSIS — I6389 Other cerebral infarction: Secondary | ICD-10-CM | POA: Diagnosis not present

## 2020-08-24 DIAGNOSIS — Z751 Person awaiting admission to adequate facility elsewhere: Secondary | ICD-10-CM

## 2020-08-24 DIAGNOSIS — Z20822 Contact with and (suspected) exposure to covid-19: Secondary | ICD-10-CM | POA: Diagnosis present

## 2020-08-24 DIAGNOSIS — Z79890 Hormone replacement therapy (postmenopausal): Secondary | ICD-10-CM | POA: Diagnosis not present

## 2020-08-24 DIAGNOSIS — R4701 Aphasia: Secondary | ICD-10-CM | POA: Diagnosis present

## 2020-08-24 DIAGNOSIS — I10 Essential (primary) hypertension: Secondary | ICD-10-CM | POA: Diagnosis not present

## 2020-08-24 DIAGNOSIS — E039 Hypothyroidism, unspecified: Secondary | ICD-10-CM | POA: Diagnosis present

## 2020-08-24 DIAGNOSIS — E1165 Type 2 diabetes mellitus with hyperglycemia: Secondary | ICD-10-CM | POA: Diagnosis present

## 2020-08-24 DIAGNOSIS — R471 Dysarthria and anarthria: Secondary | ICD-10-CM | POA: Diagnosis present

## 2020-08-24 LAB — RAPID URINE DRUG SCREEN, HOSP PERFORMED
Amphetamines: NOT DETECTED
Barbiturates: NOT DETECTED
Benzodiazepines: NOT DETECTED
Cocaine: NOT DETECTED
Opiates: NOT DETECTED
Tetrahydrocannabinol: NOT DETECTED

## 2020-08-24 LAB — CBC
HCT: 42.2 % (ref 36.0–46.0)
Hemoglobin: 14.1 g/dL (ref 12.0–15.0)
MCH: 31.5 pg (ref 26.0–34.0)
MCHC: 33.4 g/dL (ref 30.0–36.0)
MCV: 94.2 fL (ref 80.0–100.0)
Platelets: 202 10*3/uL (ref 150–400)
RBC: 4.48 MIL/uL (ref 3.87–5.11)
RDW: 15.2 % (ref 11.5–15.5)
WBC: 7.2 10*3/uL (ref 4.0–10.5)
nRBC: 0 % (ref 0.0–0.2)

## 2020-08-24 LAB — COMPREHENSIVE METABOLIC PANEL
ALT: 22 U/L (ref 0–44)
AST: 17 U/L (ref 15–41)
Albumin: 3.5 g/dL (ref 3.5–5.0)
Alkaline Phosphatase: 79 U/L (ref 38–126)
Anion gap: 10 (ref 5–15)
BUN: 16 mg/dL (ref 6–20)
CO2: 24 mmol/L (ref 22–32)
Calcium: 9.1 mg/dL (ref 8.9–10.3)
Chloride: 104 mmol/L (ref 98–111)
Creatinine, Ser: 0.96 mg/dL (ref 0.44–1.00)
GFR, Estimated: >60 mL/min (ref >60)
Glucose, Bld: 207 mg/dL — ABNORMAL HIGH (ref 70–99)
Potassium: 3.6 mmol/L (ref 3.5–5.1)
Sodium: 138 mmol/L (ref 135–145)
Total Bilirubin: 1.8 mg/dL — ABNORMAL HIGH (ref 0.3–1.2)
Total Protein: 7.1 g/dL (ref 6.5–8.1)

## 2020-08-24 LAB — RESP PANEL BY RT-PCR (FLU A&B, COVID) ARPGX2
Influenza A by PCR: NEGATIVE
Influenza B by PCR: NEGATIVE
SARS Coronavirus 2 by RT PCR: NEGATIVE

## 2020-08-24 LAB — DIFFERENTIAL
Abs Immature Granulocytes: 0.02 10*3/uL (ref 0.00–0.07)
Basophils Absolute: 0.1 10*3/uL (ref 0.0–0.1)
Basophils Relative: 1 %
Eosinophils Absolute: 0.2 10*3/uL (ref 0.0–0.5)
Eosinophils Relative: 3 %
Immature Granulocytes: 0 %
Lymphocytes Relative: 21 %
Lymphs Abs: 1.5 10*3/uL (ref 0.7–4.0)
Monocytes Absolute: 0.3 10*3/uL (ref 0.1–1.0)
Monocytes Relative: 5 %
Neutro Abs: 5.1 10*3/uL (ref 1.7–7.7)
Neutrophils Relative %: 70 %

## 2020-08-24 LAB — CBG MONITORING, ED: Glucose-Capillary: 203 mg/dL — ABNORMAL HIGH (ref 70–99)

## 2020-08-24 LAB — PROTIME-INR
INR: 1.1 (ref 0.8–1.2)
Prothrombin Time: 13.8 seconds (ref 11.4–15.2)

## 2020-08-24 LAB — URINALYSIS, ROUTINE W REFLEX MICROSCOPIC
Bacteria, UA: NONE SEEN
Bilirubin Urine: NEGATIVE
Glucose, UA: >=500 mg/dL — AB
Hgb urine dipstick: NEGATIVE
Ketones, ur: 5 mg/dL — AB
Leukocytes,Ua: NEGATIVE
Nitrite: NEGATIVE
Protein, ur: >=300 mg/dL — AB
Specific Gravity, Urine: 1.028 (ref 1.005–1.030)
pH: 6 (ref 5.0–8.0)

## 2020-08-24 LAB — POC URINE PREG, ED: Preg Test, Ur: NEGATIVE

## 2020-08-24 LAB — APTT: aPTT: 26 seconds (ref 24–36)

## 2020-08-24 LAB — MAGNESIUM: Magnesium: 1.8 mg/dL (ref 1.7–2.4)

## 2020-08-24 LAB — CK: Total CK: 49 U/L (ref 38–234)

## 2020-08-24 LAB — ETHANOL: Alcohol, Ethyl (B): <10 mg/dL (ref <10)

## 2020-08-24 LAB — PHOSPHORUS: Phosphorus: 5.3 mg/dL — ABNORMAL HIGH (ref 2.5–4.6)

## 2020-08-24 IMAGING — CT CT CERVICAL SPINE W/O CM
4 of 5 series · 12 of 33 positions shown, 14 images · non-contrast
Comparison: None.

CLINICAL DATA: Status post fall.

EXAM:
CT CERVICAL SPINE WITHOUT CONTRAST
TECHNIQUE: Multidetector CT imaging of the cervical spine was performed without
intravenous contrast. Multiplanar CT image reconstructions were also
generated.

[Series 3: c spine bone · axial · 0.28mm/px · z∈[-126,-70]mm · 2 of 85 slices shown, 3 images]
[im 29/85  soft-tissue]
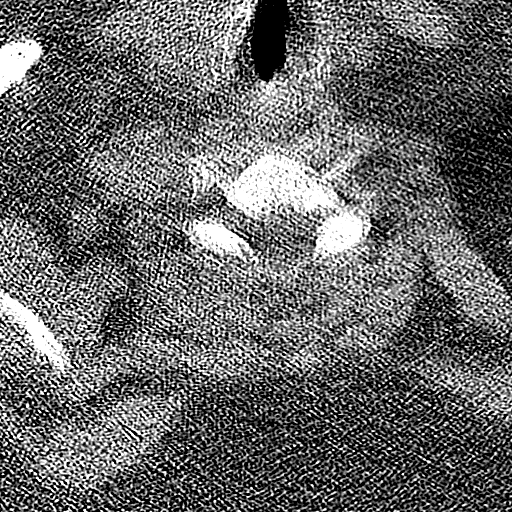
[im 29/85  bone]
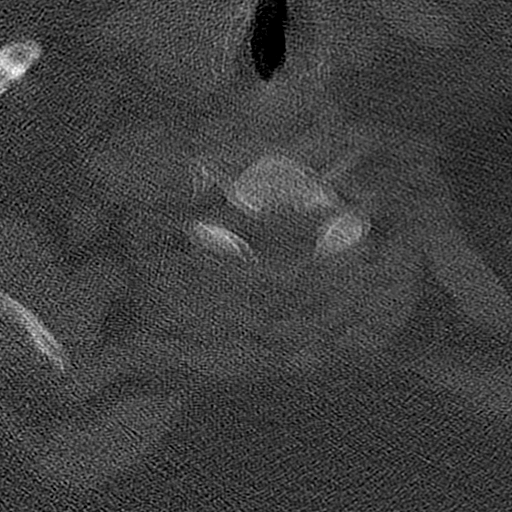
[im 57/85  bone]
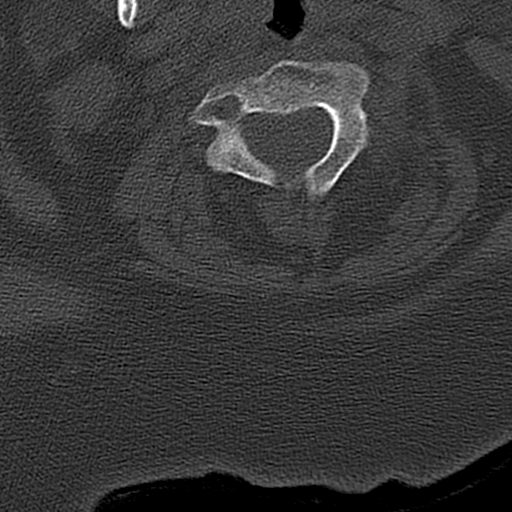

[Series 7: sagittal bone · sagittal · 0.19mm/px · 5 of 71 slices shown, 6 images]
[im 24/71  bone]
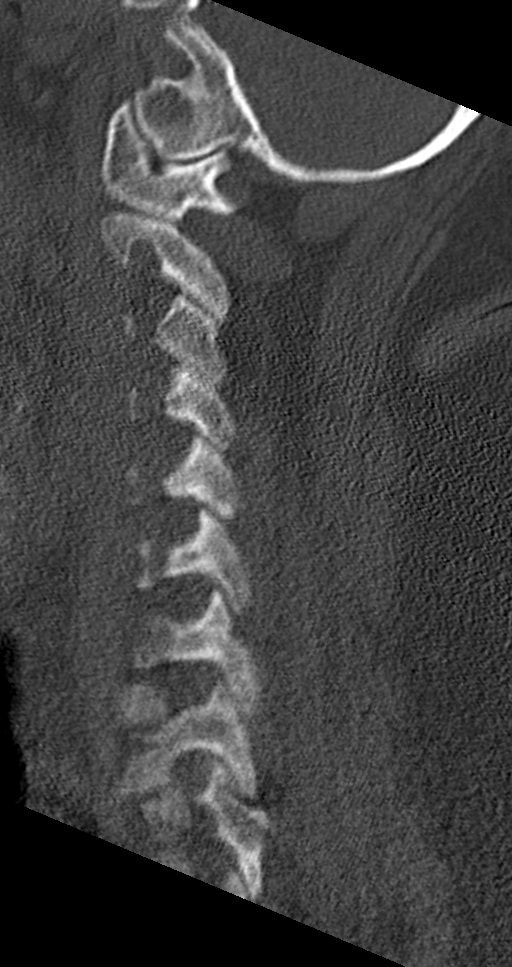
[im 30/71  bone]
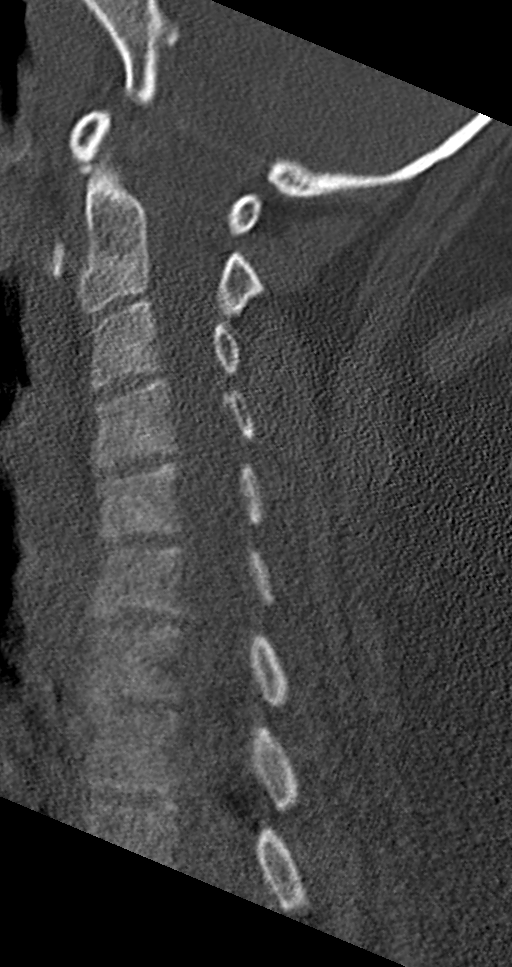
[im 36/71  soft-tissue]
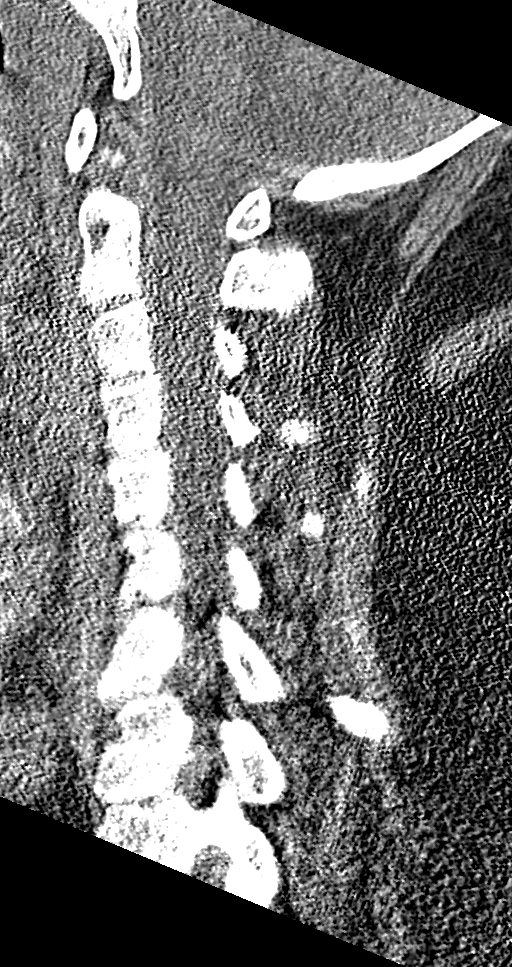
[im 36/71  bone]
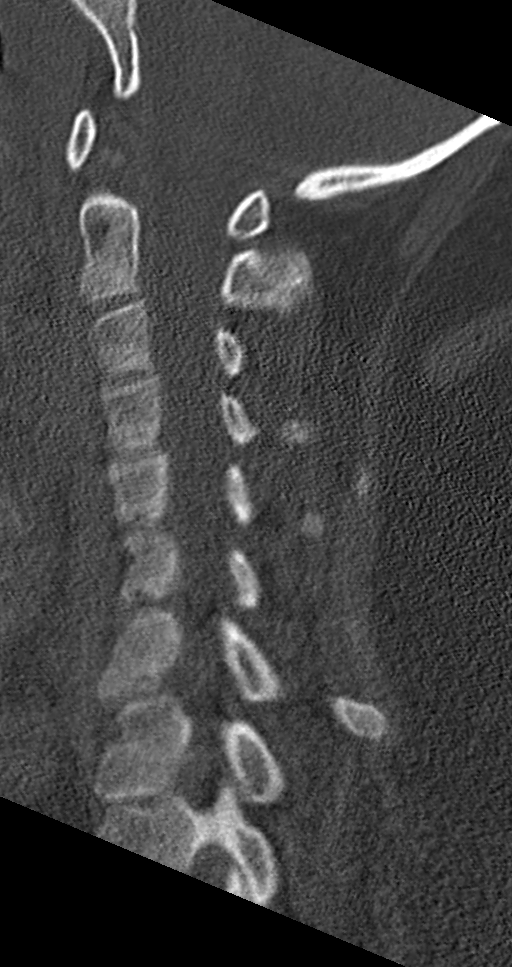
[im 41/71  bone]
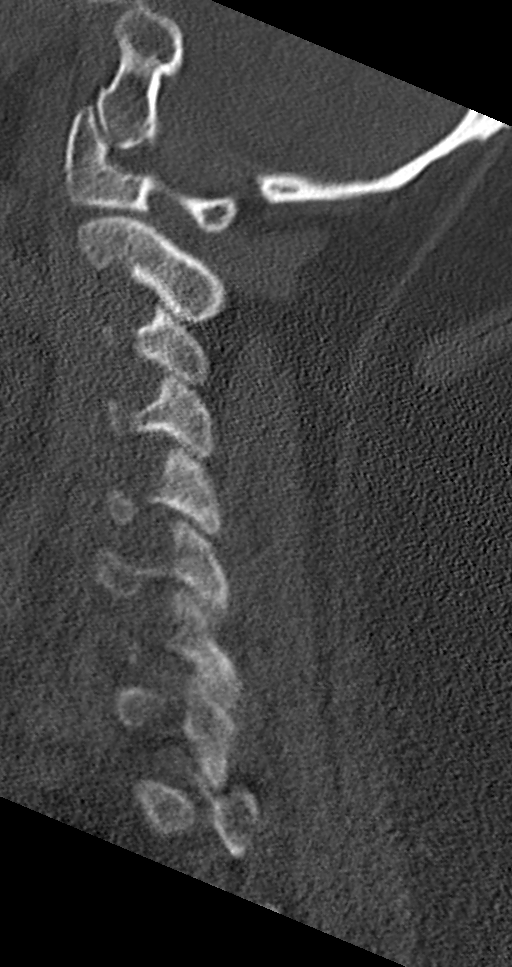
[im 47/71  bone]
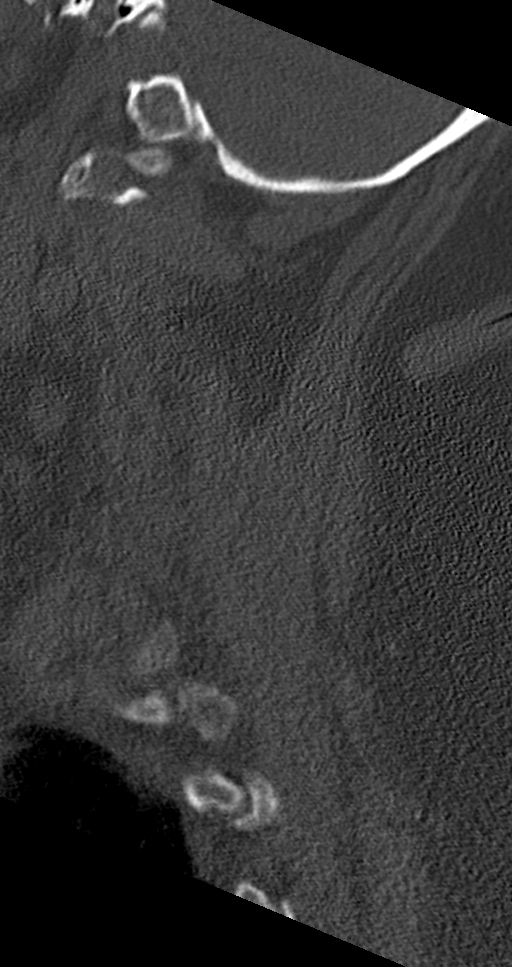

[Series 8: coronal bone · coronal · 0.28mm/px · 3 of 49 slices shown]
[im 10/49  bone]
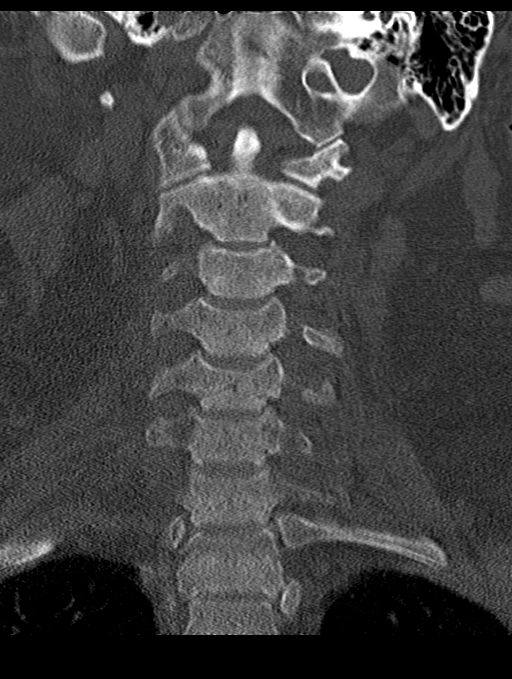
[im 20/49  bone]
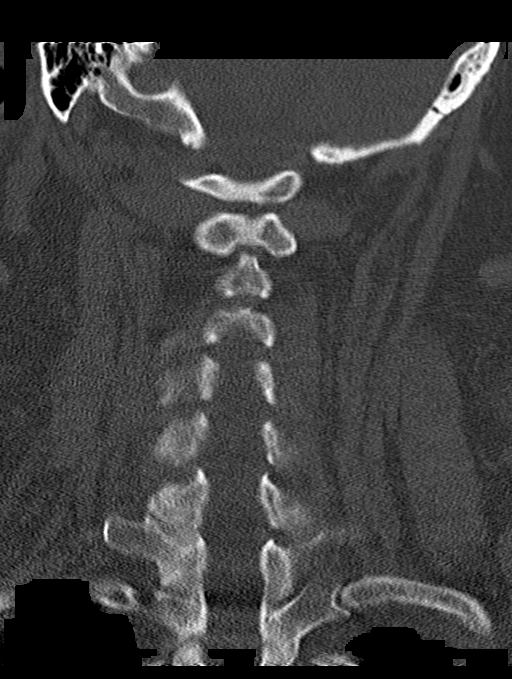
[im 29/49  bone]
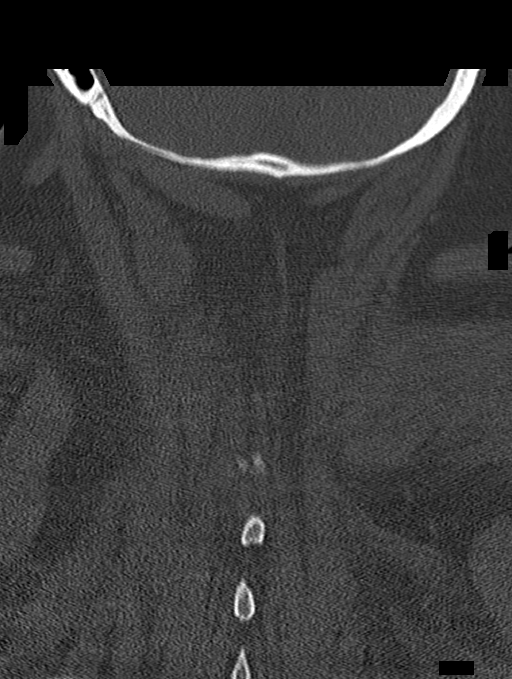

[Series 9: orthogonal axials · axial · 0.21mm/px · z∈[-148,-115]mm · 2 of 81 slices shown]
[im 21/81  bone]
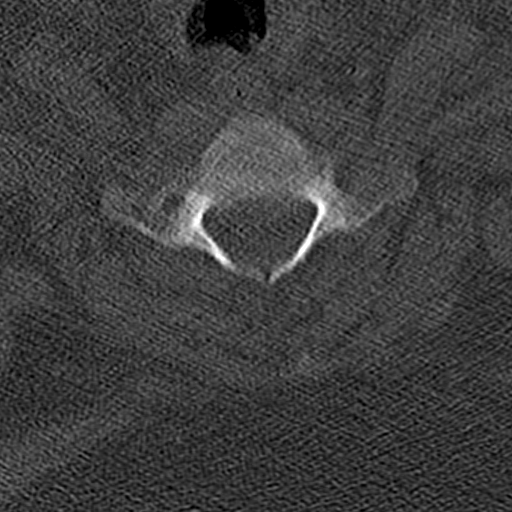
[im 41/81  bone]
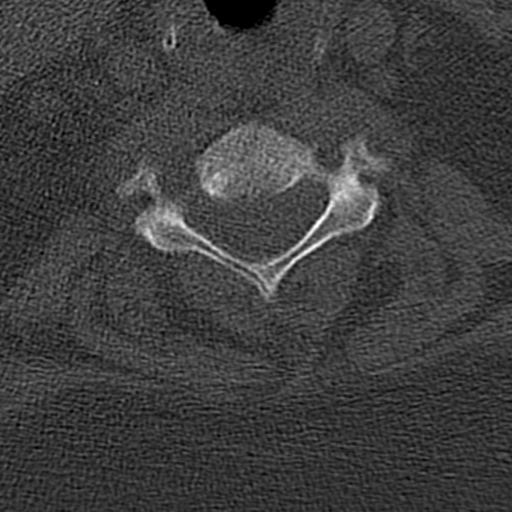

[12 of 33 positions shown; findings below may reference images not displayed]

FINDINGS: Alignment: Normal.

Skull base and vertebrae: No acute fracture. No primary bone lesion
or focal pathologic process.

Soft tissues and spinal canal: No prevertebral fluid or swelling. No
visible canal hematoma.

Disc levels:  Normal.

Upper chest: Negative.

Other: None.
IMPRESSION: No evidence of acute traumatic injury to the cervical spine.

## 2020-08-24 IMAGING — MR MR MRA HEAD W/O CM
1 of 2 series · 1 of 48 positions shown · non-contrast
Comparison: No pertinent prior exam.
COMPARISON: None

CLINICAL DATA: Right-sided facial droop and right-sided weakness

EXAM:
MRI HEAD WITHOUT CONTRAST
MRA HEAD WITHOUT CONTRAST
TECHNIQUE: Multiplanar, multi-echo pulse sequences of the brain and surrounding
structures were acquired without intravenous contrast. Angiographic
images of the Circle of Willis were acquired using MRA technique
without intravenous contrast.

[Series 109: tumble · axial · 0.5mm · 0.25mm/px · 1 of 2 slices shown]
[im 1/2]
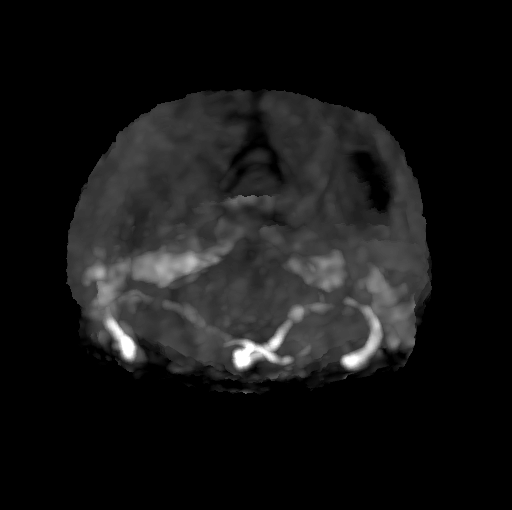

[1 of 48 positions shown; findings below may reference images not displayed]

FINDINGS: MRI HEAD

DWI and sagittal T1 sequences were obtained. Patient could not
tolerate remainder.

There is a subcentimeter infarct involving the posterior limb of the
left internal capsule. Additional area of diffusion hyperintensity
without restriction along the left periatrial white matter.

MRA HEAD

Motion artifact is present. Intracranial internal carotid arteries
and proximal anterior and middle cerebral arteries appear patent.
Intracranial vertebral arteries, basilar artery, and proximal left
posterior cerebral artery appear patent. Right P1 PCA is patent.
There is poor flow related enhancement of the remainder of the
proximal right PCA.
IMPRESSION: Acute lacunar type infarct involving the posterior limb of the left
internal capsule. Additional small subacute lacunar type infarct
involving the left periatrial white matter.

Motion degraded MRA of the head. Possible stenosis of the proximal
right PCA.

## 2020-08-24 IMAGING — CT CT HEAD W/O CM
3 series · 15 of 47 positions shown, 18 images · non-contrast
Comparison: [DATE]

CLINICAL DATA: Acute neural deficit.

EXAM:
CT HEAD WITHOUT CONTRAST
TECHNIQUE: Contiguous axial images were obtained from the base of the skull
through the vertex without intravenous contrast.

[Series 2: head w o · axial · 0.43mm/px · z∈[-14,+116]mm · 9 of 32 slices shown, 12 images]
[im 3/32  brain]
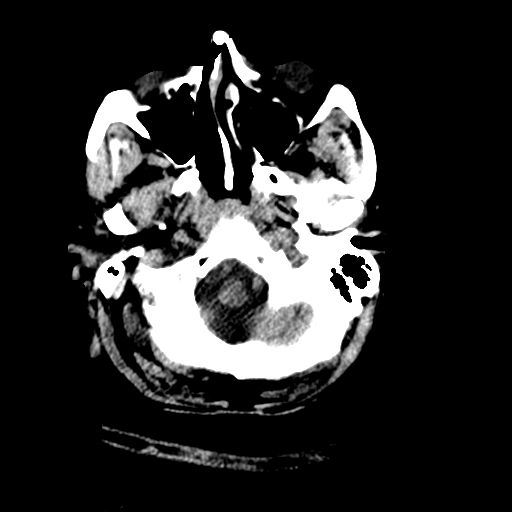
[im 3/32  bone]
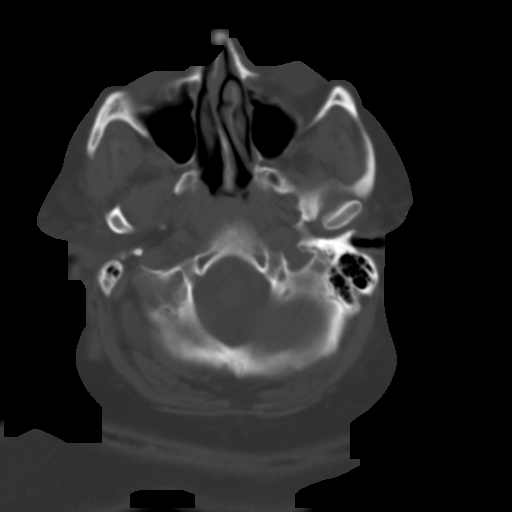
[im 6/32  brain]
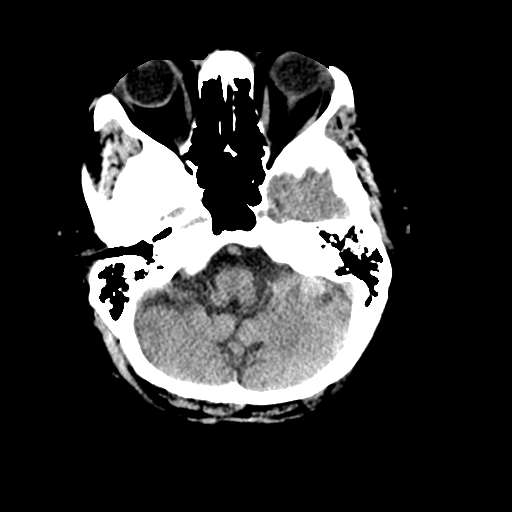
[im 9/32  brain]
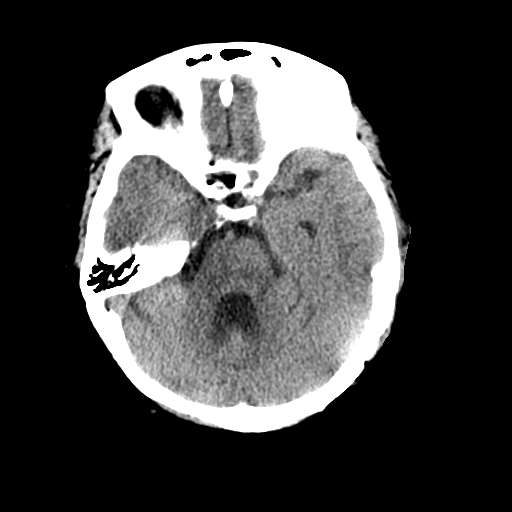
[im 12/32  brain]
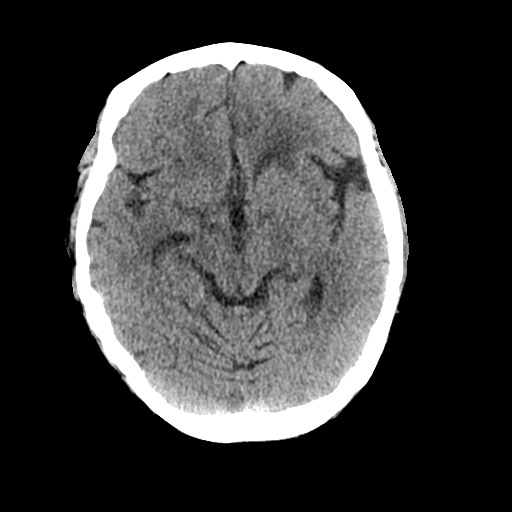
[im 17/32  brain]
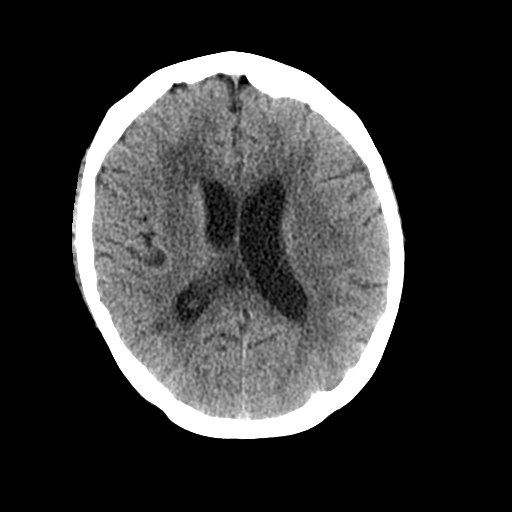
[im 17/32  bone]
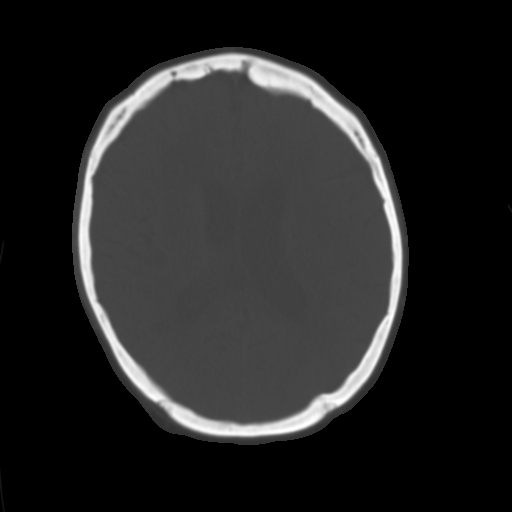
[im 20/32  brain]
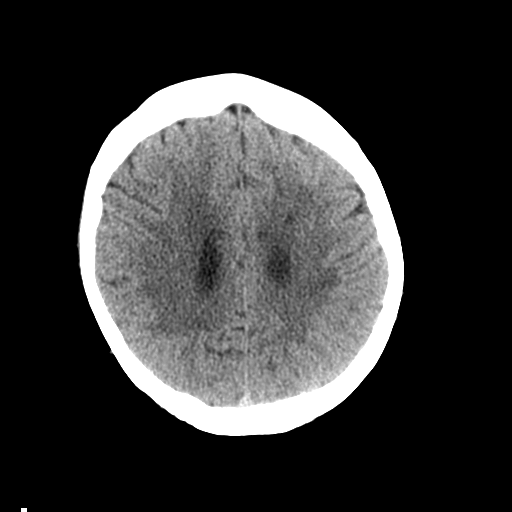
[im 23/32  brain]
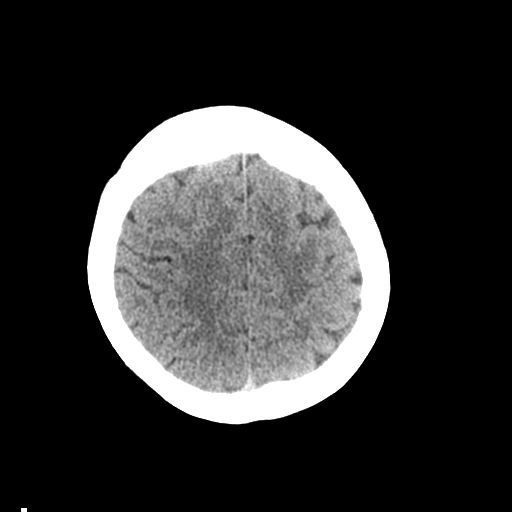
[im 26/32  brain]
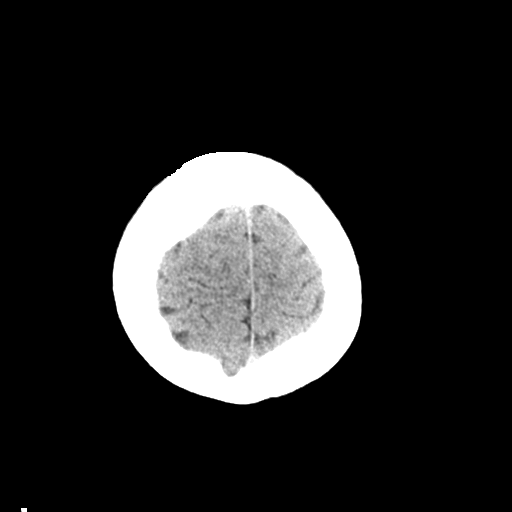
[im 29/32  brain]
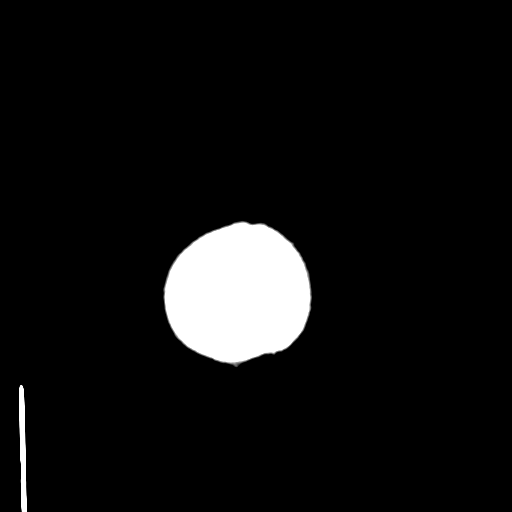
[im 29/32  bone]
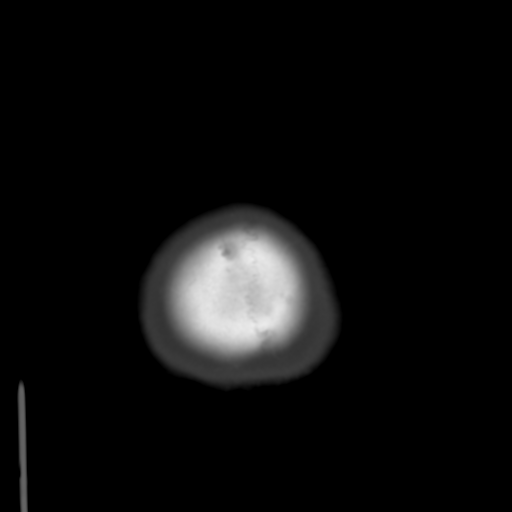

[Series 4: coronal soft · coronal · 0.32mm/px · 3 of 69 slices shown]
[im 23/69  brain]
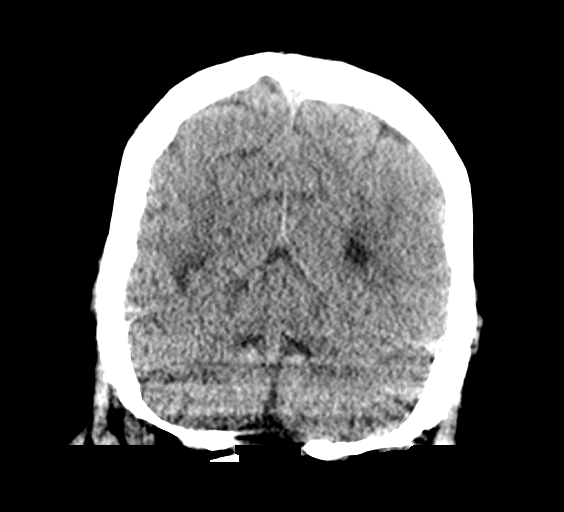
[im 31/69  brain]
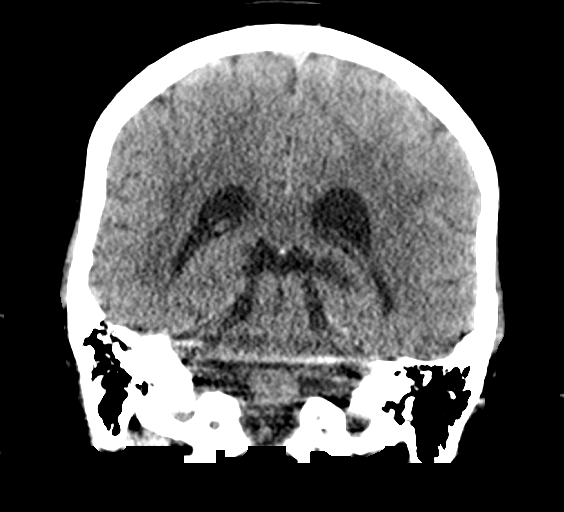
[im 38/69  brain]
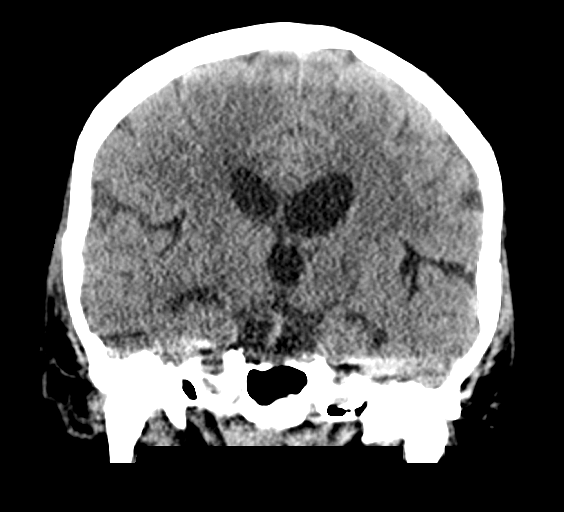

[Series 5: sagittal soft · sagittal · 0.32mm/px · 3 of 62 slices shown]
[im 21/62  brain]
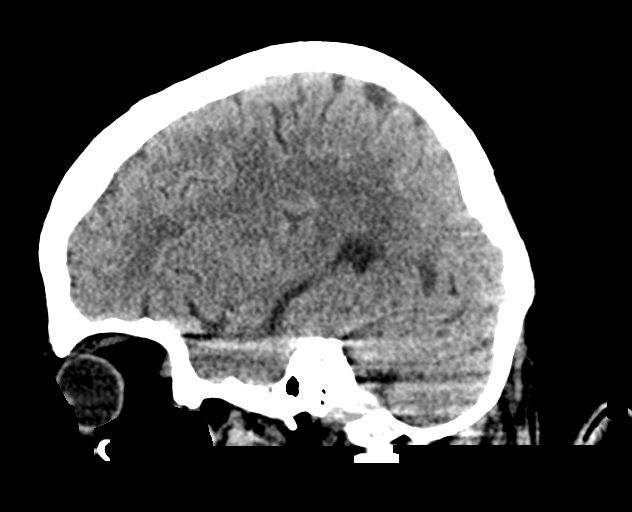
[im 31/62  brain]
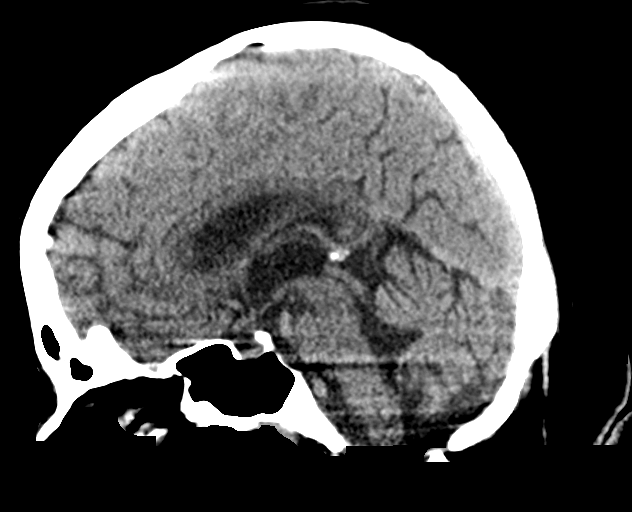
[im 41/62  brain]
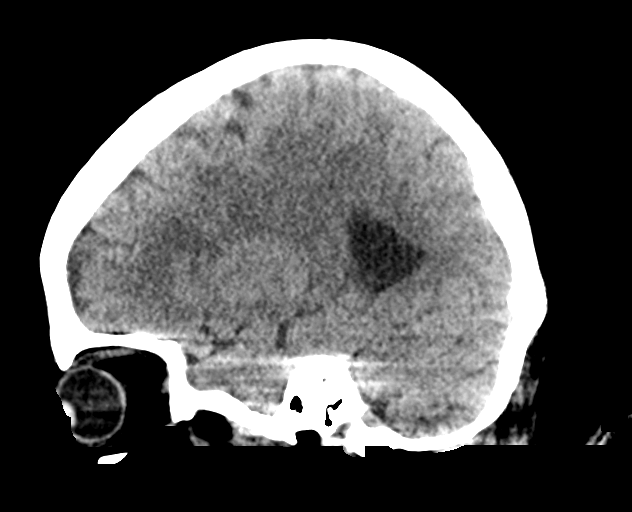

[15 of 47 positions shown; findings below may reference images not displayed]

FINDINGS: Brain: No evidence of acute hemorrhage, hydrocephalus, extra-axial
collection or mass lesion/mass effect. Areas of hypoattenuation in
the subcortical matter of the bilateral frontal lobes and right
occipital lobe may represent asymmetric microvascular ischemic
changes or other white matter disease.

Vascular: Calcific atherosclerotic disease of the intra cavernous
carotid arteries.

Skull: Normal. Negative for fracture or focal lesion.

Sinuses/Orbits: No acute finding.

Other: None.
IMPRESSION: 1. Areas of hypoattenuation in the subcortical matter of the
bilateral frontal lobes and right occipital lobe may represent
asymmetric microvascular ischemic changes or other white matter
disease.
2. No evidence of acute intracranial hemorrhage.

## 2020-08-24 IMAGING — MR MR HEAD W/O CM
5 series · 48 of 48 positions shown · non-contrast
Comparison: No pertinent prior exam.
COMPARISON: None

CLINICAL DATA: Right-sided facial droop and right-sided weakness

EXAM:
MRI HEAD WITHOUT CONTRAST
MRA HEAD WITHOUT CONTRAST
TECHNIQUE: Multiplanar, multi-echo pulse sequences of the brain and surrounding
structures were acquired without intravenous contrast. Angiographic
images of the Circle of Willis were acquired using MRA technique
without intravenous contrast.

[Series 5: DWI · axial · 4.0mm · 0.88mm/px · z∈[-40,+108]mm · 12 of 38 slices shown (1 of 4)]
[im 1/38]
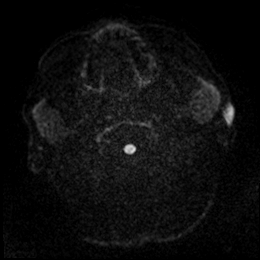
[im 4/38]
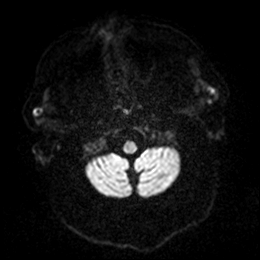
[im 7/38]
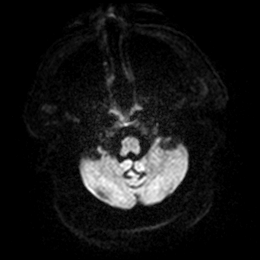
[im 11/38]
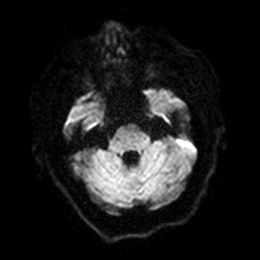
[im 14/38]
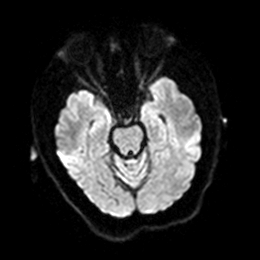
[im 17/38]
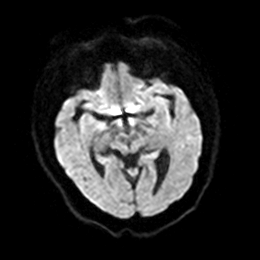
[im 21/38]
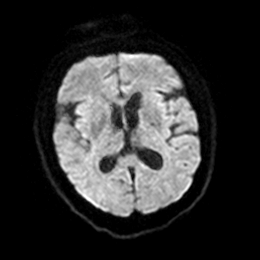
[im 24/38]
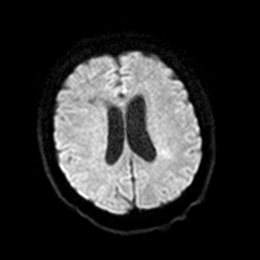
[im 27/38]
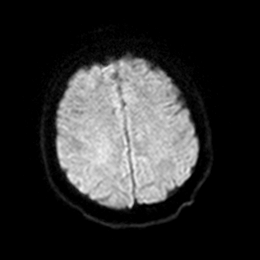
[im 31/38]
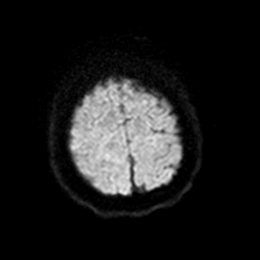
[im 34/38]
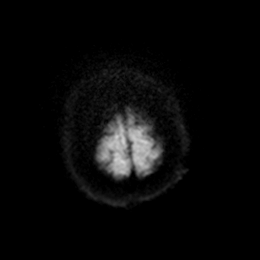
[im 38/38]
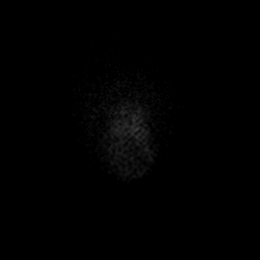

[Series 6: DWI · axial · 4.0mm · 0.88mm/px · z∈[-40,+108]mm · 11 of 38 slices shown (2 of 4)]
[im 1/38]
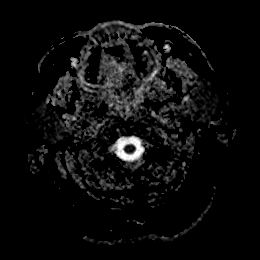
[im 4/38]
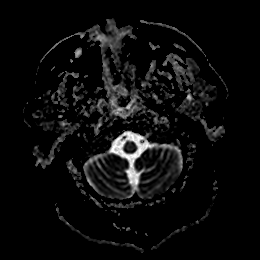
[im 8/38]
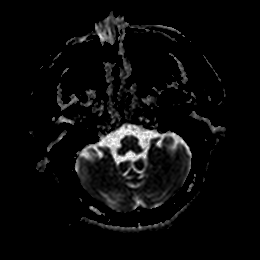
[im 12/38]
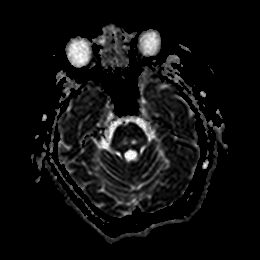
[im 15/38]
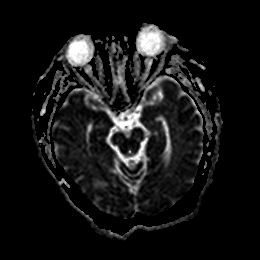
[im 19/38]
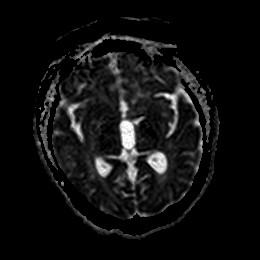
[im 23/38]
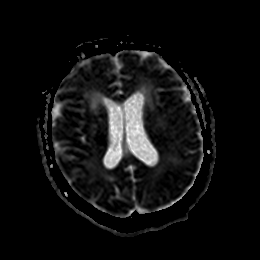
[im 26/38]
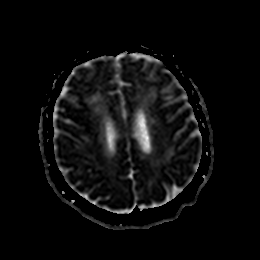
[im 30/38]
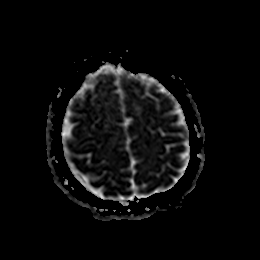
[im 34/38]
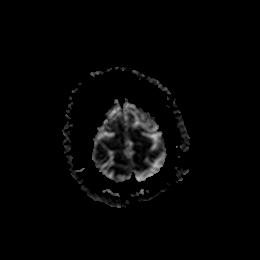
[im 38/38]
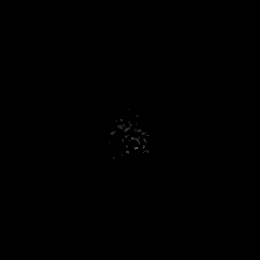

[Series 11: DWI · coronal · 5.0mm · 0.88mm/px · 9 of 30 slices shown (3 of 4)]
[im 1/30]
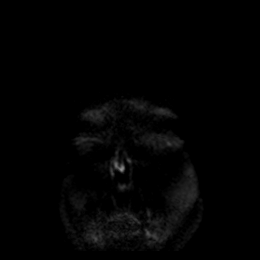
[im 4/30]
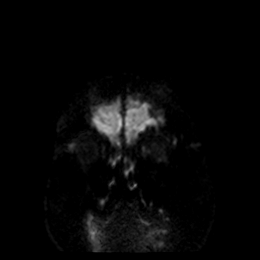
[im 8/30]
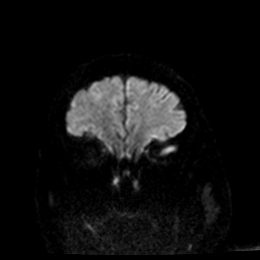
[im 11/30]
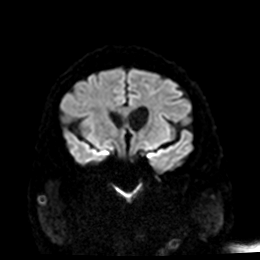
[im 15/30]
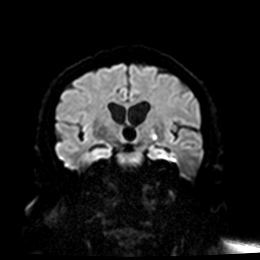
[im 19/30]
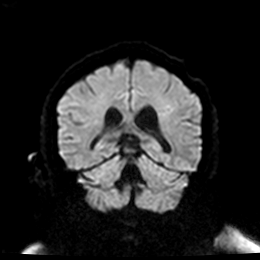
[im 22/30]
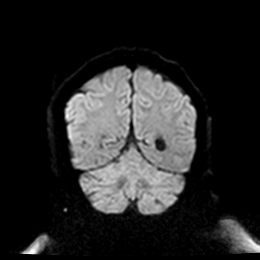
[im 26/30]
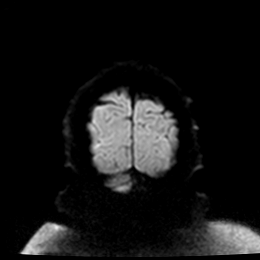
[im 30/30]
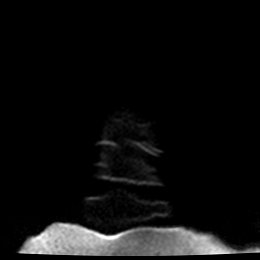

[Series 12: DWI · coronal · 5.0mm · 0.88mm/px · 9 of 30 slices shown (4 of 4)]
[im 1/30]
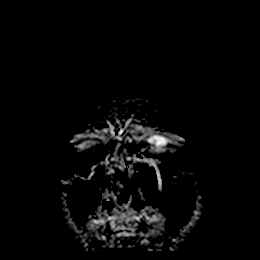
[im 4/30]
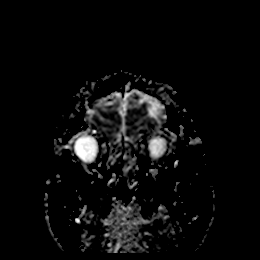
[im 8/30]
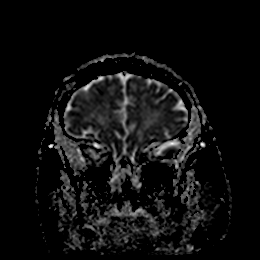
[im 11/30]
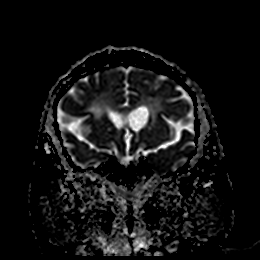
[im 15/30]
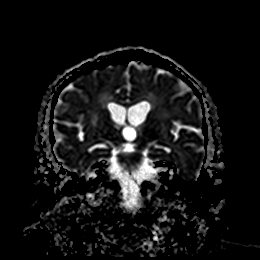
[im 19/30]
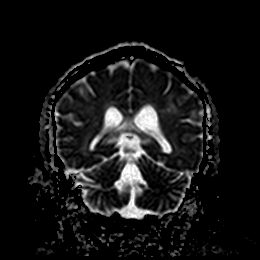
[im 22/30]
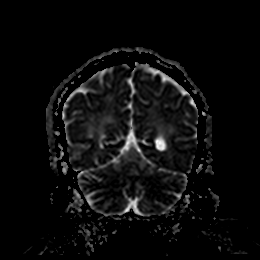
[im 26/30]
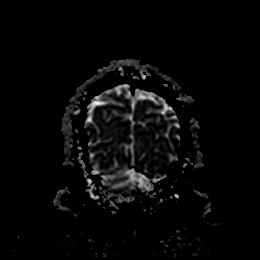
[im 30/30]
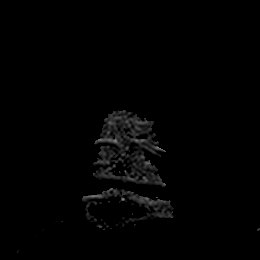

[Series 13: T1 · sagittal · 5.0mm · 0.94mm/px · 7 of 23 slices shown]
[im 1/23]
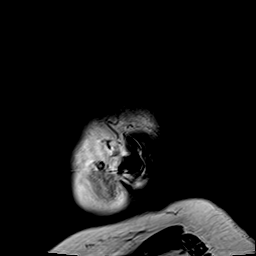
[im 4/23]
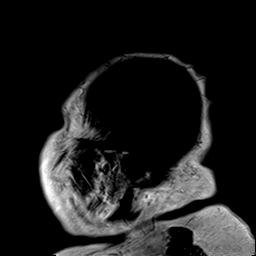
[im 8/23]
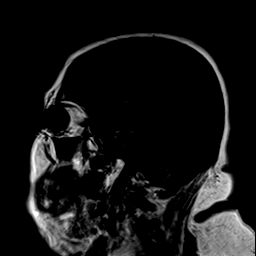
[im 12/23]
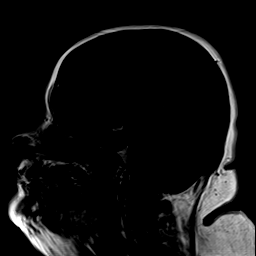
[im 15/23]
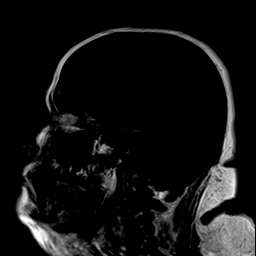
[im 19/23]
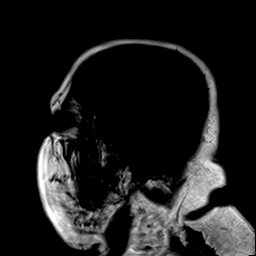
[im 23/23]
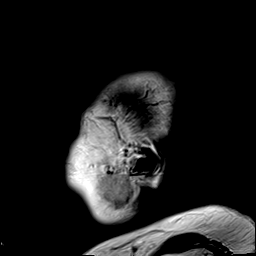

[48 of 48 positions shown; findings below may reference images not displayed]

FINDINGS: MRI HEAD

DWI and sagittal T1 sequences were obtained. Patient could not
tolerate remainder.

There is a subcentimeter infarct involving the posterior limb of the
left internal capsule. Additional area of diffusion hyperintensity
without restriction along the left periatrial white matter.

MRA HEAD

Motion artifact is present. Intracranial internal carotid arteries
and proximal anterior and middle cerebral arteries appear patent.
Intracranial vertebral arteries, basilar artery, and proximal left
posterior cerebral artery appear patent. Right P1 PCA is patent.
There is poor flow related enhancement of the remainder of the
proximal right PCA.
IMPRESSION: Acute lacunar type infarct involving the posterior limb of the left
internal capsule. Additional small subacute lacunar type infarct
involving the left periatrial white matter.

Motion degraded MRA of the head. Possible stenosis of the proximal
right PCA.

## 2020-08-24 MED ORDER — ACETAMINOPHEN 650 MG RE SUPP: 650.0000 mg | Freq: Four times a day (QID) | RECTAL | Status: DC | PRN

## 2020-08-24 MED ORDER — LABETALOL HCL 5 MG/ML IV SOLN
20.0000 mg | INTRAVENOUS | Status: DC | PRN
  Administered 2020-08-25: 20 mg via INTRAVENOUS
  Filled 2020-08-24 (×2): qty 4

## 2020-08-24 MED ORDER — POTASSIUM CHLORIDE IN NACL 40-0.9 MEQ/L-% IV SOLN: INTRAVENOUS | Status: DC

## 2020-08-24 MED ORDER — ACETAMINOPHEN 325 MG PO TABS
650.0000 mg | ORAL_TABLET | Freq: Four times a day (QID) | ORAL | Status: DC | PRN
  Administered 2020-08-29 – 2020-09-01 (×5): 650 mg via ORAL
  Filled 2020-08-24 (×5): qty 2

## 2020-08-24 MED ORDER — ASPIRIN EC 325 MG PO TBEC
325.0000 mg | DELAYED_RELEASE_TABLET | Freq: Once | ORAL | Status: DC
  Filled 2020-08-24: qty 1

## 2020-08-24 MED ORDER — PROCHLORPERAZINE EDISYLATE 10 MG/2ML IJ SOLN
10.0000 mg | Freq: Four times a day (QID) | INTRAMUSCULAR | Status: DC | PRN
  Administered 2020-08-25: 10 mg via INTRAVENOUS
  Filled 2020-08-24: qty 2

## 2020-08-24 MED ORDER — STROKE: EARLY STAGES OF RECOVERY BOOK
Freq: Once | Status: AC
  Filled 2020-08-24: qty 1

## 2020-08-24 MED ORDER — MAGNESIUM SULFATE 2 GM/50ML IV SOLN
2.0000 g | Freq: Once | INTRAVENOUS | Status: AC
  Administered 2020-08-24: 2 g via INTRAVENOUS
  Filled 2020-08-24: qty 50

## 2020-08-24 MED ORDER — CLOPIDOGREL BISULFATE 75 MG PO TABS
75.0000 mg | ORAL_TABLET | Freq: Once | ORAL | Status: DC
  Filled 2020-08-24: qty 1

## 2020-08-24 MED ORDER — SODIUM CHLORIDE 0.9 % IV BOLUS
1000.0000 mL | Freq: Once | INTRAVENOUS | Status: AC
  Administered 2020-08-24: 1000 mL via INTRAVENOUS

## 2020-08-24 MED ORDER — ENOXAPARIN SODIUM 40 MG/0.4ML IJ SOSY
40.0000 mg | PREFILLED_SYRINGE | INTRAMUSCULAR | Status: DC
  Administered 2020-08-25 – 2020-09-07 (×14): 40 mg via SUBCUTANEOUS
  Filled 2020-08-24 (×15): qty 0.4

## 2020-08-24 NOTE — ED Triage Notes (Signed)
Pt saw pt fall to the floor yesterday at 1700. S/s of a stroke. Family called ems today for pt to come to ER  Pt presents with right sided facial droop, right sided weakness and slurred speech  EDP at bedside

## 2020-08-24 NOTE — H&P (Signed)
History and Physical    Connie Shepard XBJ:[id number] DOB: 09/11/1965 DOA: 08/24/2020  PCP: Toma Deiters, MD   Patient coming from: Home.  I have personally briefly reviewed patient's old medical records in [facility]  Chief Complaint: Right-sided weakness.  HPI: Connie Shepard is a 55 y.o. female with medical history significant of chronic combined systolic and diastolic heart failure, elevated troponin, hypertension, ADD, history of bronchitis, depression, class III obesity who was brought to the emergency department due to right-sided weakness after having a fall at home.  She was last seen well the previous date at 1700.  Her family became concerned and called EMS.  She initially refused to come to the ED.  She has motor aphasia and is unable to provide further information at this time.  ED Course: Initial vital signs were temperature 97.7 F, pulse 98, respirations29, BP 182 over/127 mmHg O2 sat 92% on room air.   The patient is currently on 2 LPM via [place].  Lab work: Urinalysis showed glucosuria more than 500,  proteinuria more than 300 and ketonuria 5 mg/dL.  The rest of the urinalysis did not have any other significant abnormalities.CBC was normal.  PT/INR/PTT within expected limits.  Normal CK and alcohol level.  CMP shows a glucose of 207 and total bilirubin of 1.8 mg/dL, the rest of the values are unremarkable.   Imaging: Positive for acute lacunar type infarct involving the posterior limb below the left internal capsule.  Please see images and full radiology report for further details.  Review of Systems: As per HPI otherwise all other systems reviewed and are negative.  Past Medical History:  Diagnosis Date  . Acute heart failure (HCC)   . ADD (attention deficit disorder)   . Bronchitis   . Depression   . Diabetes mellitus without complication (HCC)   . Elevated troponin   . Hypertension   . Obesity    Past Surgical History:  Procedure Laterality Date  . CESAREAN  SECTION      Social History  reports that she has never smoked. She has never used smokeless tobacco. She reports that she does not drink alcohol and does not use drugs.  Allergies  Allergen Reactions  . Hydroxyzine     Bad dreams   Family History  Problem Relation Age of Onset  . Kidney Stones Mother   . Hypertension Mother   . Heart disease Father    Prior to Admission medications   Medication Sig Start Date End Date Taking? Authorizing Provider  buPROPion (WELLBUTRIN XL) 150 MG 24 hr tablet Take 1 tablet (150 mg total) by mouth daily. 12/17/18  Yes Remus Loffler, PA-C  carvedilol (COREG) 12.5 MG tablet Take 1 tablet (12.5 mg total) by mouth 2 (two) times daily with a meal. 03/14/20  Yes Vassie Loll, MD  escitalopram (LEXAPRO) 20 MG tablet Take 1 tablet (20 mg total) by mouth daily. 12/17/18  Yes Remus Loffler, PA-C  hydrALAZINE (APRESOLINE) 50 MG tablet TAKE 1/2 TABLET 3 TIMES DAILY 09/03/18  Yes Remus Loffler, PA-C  levothyroxine (SYNTHROID) 50 MCG tablet Take 1 tablet (50 mcg total) by mouth daily before breakfast. 03/15/20  Yes Vassie Loll, MD  metFORMIN (GLUCOPHAGE) 500 MG tablet Take 1 tablet (500 mg total) by mouth daily with breakfast. 12/17/18  Yes Remus Loffler, PA-C  potassium chloride SA (KLOR-CON) 20 MEQ tablet Take 1 tablet (20 mEq total) by mouth 2 (two) times daily. 06/28/20  Yes Dione Booze, MD  sacubitril-valsartan (ENTRESTO) 49-51 MG Take 1 tablet by mouth 2 (two) times daily. 03/19/20  Yes Rollene RotundaHochrein, James, MD  spironolactone (ALDACTONE) 25 MG tablet Take 1 tablet (25 mg total) by mouth daily. 12/17/18  Yes Remus LofflerJones, Angel S, PA-C  torsemide (DEMADEX) 20 MG tablet Take 2 tablets (40 mg total) by mouth 2 (two) times daily. 03/14/20  Yes Vassie LollMadera, Carlos, MD  traZODone (DESYREL) 50 MG tablet Take 1 tablet by mouth at bedtime as needed. 03/16/20  Yes [provider]  Vitamin D, Ergocalciferol, (DRISDOL) 1.25 MG (50000 UNIT) CAPS capsule Take 1 capsule (50,000 Units  total) by mouth every 7 (seven) days. 03/20/20   Vassie LollMadera, Carlos, MD   Physical Exam: Vitals:   08/24/20 1900 08/24/20 1913 08/24/20 1922 08/24/20 1945  BP: (!) 180/105 (!) 162/96 (!) 165/112 (!) 153/89  Pulse: (!) 103 98 (!) 102 82  Resp: (!) 22 (!) 21 (!) 21 19  Temp:      TempSrc:      SpO2:  99%  96%  Weight:      Height:       Constitutional: NAD, calm, comfortable Eyes: PERRL, lids and conjunctivae normal ENMT: Mucous membranes are moist. Posterior pharynx clear of any exudate or lesions. Neck: normal, supple, no masses, no thyromegaly Respiratory: Clear to auscultation bilaterally, no wheezing, no crackles. Normal respiratory effort. No accessory muscle use.  Cardiovascular: Regular rate and rhythm, no murmurs / rubs / gallops. No extremity edema. 2+ pedal pulses. No carotid bruits.  Abdomen: Obese, no distention.  Bowel sounds positive.  Soft, no tenderness, no masses palpated. No hepatosplenomegaly. Musculoskeletal: no clubbing / cyanosis. Good ROM, no contractures. Normal muscle tone.  Skin: no rashes, lesions, ulcers on very limited dermatological examination. Neurologic: Right facial droop.  Motor aphasia.  1/5 right-sided hemiparesis.  Decreased sensation on the right side. Psychiatric: Currently awake and alert, knows she is in the hospital.  Unable to fully evaluate.  Labs on Admission: I have personally reviewed following labs and imaging studies  CBC: Recent Labs  Lab 08/24/20 1624  WBC 7.2  NEUTROABS 5.1  HGB 14.1  HCT 42.2  MCV 94.2  PLT 202    Basic Metabolic Panel: Recent Labs  Lab 08/24/20 1624  NA 138  K 3.6  CL 104  CO2 24  GLUCOSE 207*  BUN 16  CREATININE 0.96  CALCIUM 9.1    GFR: Estimated Creatinine Clearance: 84.6 mL/min (by C-G formula based on SCr of 0.96 mg/dL).  Liver Function Tests: Recent Labs  Lab 08/24/20 1624  AST 17  ALT 22  ALKPHOS 79  BILITOT 1.8*  PROT 7.1  ALBUMIN 3.5   Urine analysis:    Component Value  Date/Time   COLORURINE YELLOW 08/24/2020 1730   APPEARANCEUR CLEAR 08/24/2020 1730   LABSPEC 1.028 08/24/2020 1730   PHURINE 6.0 08/24/2020 1730   GLUCOSEU >=500 (A) 08/24/2020 1730   HGBUR NEGATIVE 08/24/2020 1730   BILIRUBINUR NEGATIVE 08/24/2020 1730   KETONESUR 5 (A) 08/24/2020 1730   PROTEINUR >=300 (A) 08/24/2020 1730   UROBILINOGEN 0.2 03/19/2010 1239   NITRITE NEGATIVE 08/24/2020 1730   LEUKOCYTESUR NEGATIVE 08/24/2020 1730   Radiological Exams on Admission: CT HEAD WO CONTRAST  Result Date: 08/24/2020 CLINICAL DATA:  Acute neural deficit. EXAM: CT HEAD WITHOUT CONTRAST TECHNIQUE: Contiguous axial images were obtained from the base of the skull through the vertex without intravenous contrast. COMPARISON:  March 11, 2020 FINDINGS: Brain: No evidence of acute hemorrhage, hydrocephalus, extra-axial collection or mass lesion/mass  effect. Areas of hypoattenuation in the subcortical matter of the bilateral frontal lobes and right occipital lobe may represent asymmetric microvascular ischemic changes or other white matter disease. Vascular: Calcific atherosclerotic disease of the intra cavernous carotid arteries. Skull: Normal. Negative for fracture or focal lesion. Sinuses/Orbits: No acute finding. Other: None. IMPRESSION: 1. Areas of hypoattenuation in the subcortical matter of the bilateral frontal lobes and right occipital lobe may represent asymmetric microvascular ischemic changes or other white matter disease. 2. No evidence of acute intracranial hemorrhage. Electronically Signed   By: Ted Mcalpine M.D.   On: 08/24/2020 17:10   CT CERVICAL SPINE WO CONTRAST  Result Date: 08/24/2020 CLINICAL DATA:  Status post fall. EXAM: CT CERVICAL SPINE WITHOUT CONTRAST TECHNIQUE: Multidetector CT imaging of the cervical spine was performed without intravenous contrast. Multiplanar CT image reconstructions were also generated. COMPARISON:  None. FINDINGS: Alignment: Normal. Skull base and  vertebrae: No acute fracture. No primary bone lesion or focal pathologic process. Soft tissues and spinal canal: No prevertebral fluid or swelling. No visible canal hematoma. Disc levels:  Normal. Upper chest: Negative. Other: None. IMPRESSION: No evidence of acute traumatic injury to the cervical spine. Electronically Signed   By: Ted Mcalpine M.D.   On: 08/24/2020 17:13   MR ANGIO HEAD WO CONTRAST  Result Date: 08/24/2020 CLINICAL DATA:  Right-sided facial droop and right-sided weakness EXAM: MRI HEAD WITHOUT CONTRAST MRA HEAD WITHOUT CONTRAST TECHNIQUE: Multiplanar, multi-echo pulse sequences of the brain and surrounding structures were acquired without intravenous contrast. Angiographic images of the Circle of Willis were acquired using MRA technique without intravenous contrast. COMPARISON: No pertinent prior exam. COMPARISON:  None FINDINGS: MRI HEAD DWI and sagittal T1 sequences were obtained. Patient could not tolerate remainder. There is a subcentimeter infarct involving the posterior limb of the left internal capsule. Additional area of diffusion hyperintensity without restriction along the left periatrial white matter. MRA HEAD Motion artifact is present. Intracranial internal carotid arteries and proximal anterior and middle cerebral arteries appear patent. Intracranial vertebral arteries, basilar artery, and proximal left posterior cerebral artery appear patent. Right P1 PCA is patent. There is poor flow related enhancement of the remainder of the proximal right PCA. IMPRESSION: Acute lacunar type infarct involving the posterior limb of the left internal capsule. Additional small subacute lacunar type infarct involving the left periatrial white matter. Motion degraded MRA of the head. Possible stenosis of the proximal right PCA. Electronically Signed   By: Guadlupe Spanish M.D.   On: 08/24/2020 19:09   MR BRAIN WO CONTRAST  Result Date: 08/24/2020 CLINICAL DATA:  Right-sided facial droop  and right-sided weakness EXAM: MRI HEAD WITHOUT CONTRAST MRA HEAD WITHOUT CONTRAST TECHNIQUE: Multiplanar, multi-echo pulse sequences of the brain and surrounding structures were acquired without intravenous contrast. Angiographic images of the Circle of Willis were acquired using MRA technique without intravenous contrast. COMPARISON: No pertinent prior exam. COMPARISON:  None FINDINGS: MRI HEAD DWI and sagittal T1 sequences were obtained. Patient could not tolerate remainder. There is a subcentimeter infarct involving the posterior limb of the left internal capsule. Additional area of diffusion hyperintensity without restriction along the left periatrial white matter. MRA HEAD Motion artifact is present. Intracranial internal carotid arteries and proximal anterior and middle cerebral arteries appear patent. Intracranial vertebral arteries, basilar artery, and proximal left posterior cerebral artery appear patent. Right P1 PCA is patent. There is poor flow related enhancement of the remainder of the proximal right PCA. IMPRESSION: Acute lacunar type infarct involving the posterior limb of  the left internal capsule. Additional small subacute lacunar type infarct involving the left periatrial white matter. Motion degraded MRA of the head. Possible stenosis of the proximal right PCA. Electronically Signed   By: Guadlupe Spanish M.D.   On: 08/24/2020 19:09   November/2021 echocardiogram IMPRESSIONS:  1. Very limited images.  2. Left ventricular ejection fraction, by estimation, is approximately  35%. The left ventricle has moderately decreased function. Left  ventricular endocardial border not optimally defined to evaluate regional  wall motion even with Definity contrast.  There is mild left ventricular hypertrophy. Left ventricular diastolic  parameters are indeterminate.  3. Right ventricular systolic function is severely reduced. The right  ventricular size is normal.  4. The mitral valve is grossly  normal. Trivial mitral valve  regurgitation.  5. The aortic valve is tricuspid. There is mild calcification of the  aortic valve. Aortic valve regurgitation is not visualized.  6. Unable to estimate CVP.   EKG: Independently reviewed.  Vent. rate 99 BPM PR interval 183 ms QRS duration 96 ms QT/QTcB 397/510 ms P-R-T axes 52 -11 45 Sinus tachycardia Borderline prolonged QT interval  Assessment/Plan Principal Problem:   Acute CVA (cerebrovascular accident) (HCC) Admit to stepdown/inpatient. Keep n.p.o. Frequent neurochecks. Swallow screen. Consult PT and OT. Check fasting lipids. Check hemoglobin A1c. Check echocardiogram and carotid Doppler. Continue antiplatelet therapy. Consult neurology in AM.  Active Problems:   Essential hypertension Hold antihypertensives in the setting of new CVA. Allow permissive hypertension to 220/120 mmHg. Resume antihypertensives after penumbra period.    Depression  On bupropion and escitalopram. She is currently NPO.    Hypothyroidism Continue levothyroxine 50 mcg QD once cleared for oral intake. Began levothyroxine 25 mcg IV daily if unable to swallow safely.    Type 2 diabetes mellitus with complication, without long-term current use of insulin (HCC) Carbohydrate modified diet. CBG monitoring with RI SS. Resume metformin once cleared for oral intake.    Borderline prolonged QT interval Magnesium sulfate 2 g IVPB given. Avoid medications causing QT prolongation. Monitoring optimize electrolytes.    Chronic combined systolic and diastolic congestive heart failure (HCC) No signs of decompensation at this time.   DVT prophylaxis: Lovenox SQ. Code Status:   Full code. Family Communication:   Disposition Plan:   Patient is from:  Home.  Anticipated DC to:  Home.  Anticipated DC date:  08/27/2020.  Anticipated DC barriers: Clinical status and pending work-up.  Consults called:  Routine neurology consult. Admission status:   Stepdown/inpatient.   Severity of Illness: High severity due to presenting with right-sided weakness associated with motor aphasia in the setting of left-sided CVA.  At the patient will need to remain for close monitoring, further work-up and inpatient evaluation by neurology.  Bobette Mo MD [facility]  How to contact the [facility] Attending or Consulting provider 7A - 7P or covering provider during after hours 7P -7A, for this patient?   1. Check the care team in [facility] and look for a) attending/consulting [facility] provider listed and b) the [facility] team listed 2. Log into [email or web address] and use [facility]'s universal password to access. If you do not have the password, please contact the hospital operator. 3. Locate the [facility] provider you are looking for under [facility] and page to a number that you can be directly reached. 4. If you still have difficulty reaching the provider, please page the [facility] (Director on Call) for the Hospitalists listed on amion for assistance.  08/24/2020, 8:08 PM  This document was prepared using Dragon voice recognition software and may contain some unintended transcription errors.

## 2020-08-24 NOTE — ED Notes (Signed)
Pt's oxygen dropped to 82% on RA. Denies having to wear oxygen or CPAP at home.   Pt placed on 3 L [place]. Oxygen now 92%

## 2020-08-24 NOTE — ED Provider Notes (Signed)
[facility] EMERGENCY DEPARTMENT Provider Note   CSN: [id number] Arrival date & time: 08/24/20  1619     History Chief Complaint  Patient presents with  . Altered Mental Status    Connie Shepard is a 55 y.o. female.  HPI Patient presents BMS for evaluation of fall, with slurred speech and right-sided weakness which occurred at 5 PM yesterday.  She was unable to get up from floor afterwards and refused transport, by telling her parents that she did not want to go to the hospital.  Finally, this afternoon, she agreed to come to the hospital.  She landed floor overnight.  She is unable to give complete history due to severe illness   Level 5 caveat-high acuity    Past Medical History:  Diagnosis Date  . Acute heart failure (HCC)   . ADD (attention deficit disorder)   . Bronchitis   . Depression   . Diabetes mellitus without complication (HCC)   . Elevated troponin   . Hypertension   . Obesity     Patient Active Problem List   Diagnosis Date Noted  . Acute CVA (cerebrovascular accident) (HCC) 08/24/2020  . Type 2 diabetes mellitus with complication, without long-term current use of insulin (HCC) 03/16/2020  . Anasarca   . Diabetes mellitus (HCC)   . Hypokalemia   . Vitamin D deficiency   . Hypothyroidism   . Physical deconditioning   . Acute on chronic systolic CHF (congestive heart failure) (HCC) 03/09/2020  . Acute on chronic systolic HF (heart failure) (HCC) 03/09/2020  . Hyperglycemia 12/22/2018  . Hypertensive crisis 04/04/2018  . Acute CHF (congestive heart failure) (HCC) 04/04/2018  . Mild renal insufficiency 04/04/2018  . Elevated troponin 04/04/2018  . Bronchitis   . Obesity   . Seborrheic dermatitis of scalp 05/23/2016  . Essential hypertension 12/02/2015  . Depression 12/02/2015  . Primary insomnia 12/02/2015  . Morbid obesity with body mass index of 50.0-59.9 in adult (HCC) 12/02/2015  . Attention deficit hyperactivity disorder (ADHD), predominantly  inattentive type 12/02/2015    Past Surgical History:  Procedure Laterality Date  . CESAREAN SECTION       OB History   No obstetric history on file.     Family History  Problem Relation Age of Onset  . Kidney Stones Mother   . Hypertension Mother   . Heart disease Father     Social History   Tobacco Use  . Smoking status: Never Smoker  . Smokeless tobacco: Never Used  Vaping Use  . Vaping Use: Never used  Substance Use Topics  . Alcohol use: No  . Drug use: No    Home Medications Prior to Admission medications   Medication Sig Start Date End Date Taking? Authorizing Provider  buPROPion (WELLBUTRIN XL) 150 MG 24 hr tablet Take 1 tablet (150 mg total) by mouth daily. 12/17/18   Remus Loffler, PA-C  carvedilol (COREG) 12.5 MG tablet Take 1 tablet (12.5 mg total) by mouth 2 (two) times daily with a meal. 03/14/20   Vassie Loll, MD  escitalopram (LEXAPRO) 20 MG tablet Take 1 tablet (20 mg total) by mouth daily. 12/17/18   Remus Loffler, PA-C  hydrALAZINE (APRESOLINE) 50 MG tablet TAKE 1/2 TABLET 3 TIMES DAILY 09/03/18   Remus Loffler, PA-C  levothyroxine (SYNTHROID) 50 MCG tablet Take 1 tablet (50 mcg total) by mouth daily before breakfast. 03/15/20   Vassie Loll, MD  metFORMIN (GLUCOPHAGE) 500 MG tablet Take 1 tablet (500 mg total) by  mouth daily with breakfast. 12/17/18   Remus Loffler, PA-C  potassium chloride SA (KLOR-CON) 20 MEQ tablet Take 1 tablet (20 mEq total) by mouth 2 (two) times daily. 06/28/20   Dione Booze, MD  sacubitril-valsartan (ENTRESTO) 49-51 MG Take 1 tablet by mouth 2 (two) times daily. 03/19/20   Rollene Rotunda, MD  spironolactone (ALDACTONE) 25 MG tablet Take 1 tablet (25 mg total) by mouth daily. 12/17/18   Remus Loffler, PA-C  torsemide (DEMADEX) 20 MG tablet Take 2 tablets (40 mg total) by mouth 2 (two) times daily. 03/14/20   Vassie Loll, MD  traZODone (DESYREL) 50 MG tablet Take 1 tablet by mouth at bedtime as needed. 03/16/20   [provider]  Vitamin D, Ergocalciferol, (DRISDOL) 1.25 MG (50000 UNIT) CAPS capsule Take 1 capsule (50,000 Units total) by mouth every 7 (seven) days. 03/20/20   Vassie Loll, MD    Allergies    Hydroxyzine  Review of Systems   Review of Systems  All other systems reviewed and are negative.   Physical Exam Updated Vital Signs BP (!) 153/89   Pulse 82   Temp 98.7 F (37.1 C) (Oral)   Resp 19   Ht  (1.626 m)   Wt 115.7 kg   SpO2 96%   BMI 43.77 kg/m   Physical Exam Vitals and nursing note reviewed.  Constitutional:      General: She is in acute distress.     Appearance: She is well-developed. She is not toxic-appearing or diaphoretic.  HENT:     Head: Normocephalic and atraumatic.     Right Ear: External ear normal.     Left Ear: External ear normal.  Eyes:     Conjunctiva/sclera: Conjunctivae normal.     Pupils: Pupils are equal, round, and reactive to light.  Neck:     Trachea: Phonation normal.  Cardiovascular:     Rate and Rhythm: Normal rate and regular rhythm.     Heart sounds: Normal heart sounds.  Pulmonary:     Effort: Pulmonary effort is normal.     Breath sounds: Normal breath sounds.  Abdominal:     Palpations: Abdomen is soft.     Tenderness: There is no abdominal tenderness.  Musculoskeletal:        General: Normal range of motion.     Cervical back: Normal range of motion and neck supple.  Skin:    General: Skin is warm and dry.  Neurological:     Mental Status: She is alert.     Cranial Nerves: No cranial nerve deficit.     Motor: No abnormal muscle tone.     Coordination: Coordination normal.     Comments: Flaccid paralysis right with right facial weakness.  Dysarthria present.  No aphasia.  No neglect.  Psychiatric:        Mood and Affect: Mood normal.        Behavior: Behavior normal.     ED Results / Procedures / Treatments   Labs (all labs ordered are listed, but only abnormal results are displayed) Labs Reviewed   COMPREHENSIVE METABOLIC PANEL - Abnormal; Notable for the following components:      Result Value   Glucose, Bld 207 (*)    Total Bilirubin 1.8 (*)    All other components within normal limits  URINALYSIS, ROUTINE W REFLEX MICROSCOPIC - Abnormal; Notable for the following components:   Glucose, UA >=500 (*)    Ketones, ur 5 (*)  Protein, ur >=300 (*)    All other components within normal limits  CBG MONITORING, ED - Abnormal; Notable for the following components:   Glucose-Capillary 203 (*)    All other components within normal limits  RESP PANEL BY RT-PCR (FLU A&B, COVID) ARPGX2  ETHANOL  PROTIME-INR  APTT  CBC  DIFFERENTIAL  RAPID URINE DRUG SCREEN, HOSP PERFORMED  CK  POC URINE PREG, ED    EKG EKG Interpretation  Date/Time:  Wednesday Aug 24 2020 16:24:49 EDT Ventricular Rate:  99 PR Interval:  183 QRS Duration: 96 QT Interval:  397 QTC Calculation: 510 R Axis:   -11 Text Interpretation: Sinus tachycardia Borderline prolonged QT interval since last tracing no significant change Confirmed by Mancel Bale [phone]) on 08/24/2020 4:27:35 PM   Radiology CT HEAD WO CONTRAST  Result Date: 08/24/2020 CLINICAL DATA:  Acute neural deficit. EXAM: CT HEAD WITHOUT CONTRAST TECHNIQUE: Contiguous axial images were obtained from the base of the skull through the vertex without intravenous contrast. COMPARISON:  March 11, 2020 FINDINGS: Brain: No evidence of acute hemorrhage, hydrocephalus, extra-axial collection or mass lesion/mass effect. Areas of hypoattenuation in the subcortical matter of the bilateral frontal lobes and right occipital lobe may represent asymmetric microvascular ischemic changes or other white matter disease. Vascular: Calcific atherosclerotic disease of the intra cavernous carotid arteries. Skull: Normal. Negative for fracture or focal lesion. Sinuses/Orbits: No acute finding. Other: None. IMPRESSION: 1. Areas of hypoattenuation in the subcortical matter of the  bilateral frontal lobes and right occipital lobe may represent asymmetric microvascular ischemic changes or other white matter disease. 2. No evidence of acute intracranial hemorrhage. Electronically Signed   By: Ted Mcalpine M.D.   On: 08/24/2020 17:10   CT CERVICAL SPINE WO CONTRAST  Result Date: 08/24/2020 CLINICAL DATA:  Status post fall. EXAM: CT CERVICAL SPINE WITHOUT CONTRAST TECHNIQUE: Multidetector CT imaging of the cervical spine was performed without intravenous contrast. Multiplanar CT image reconstructions were also generated. COMPARISON:  None. FINDINGS: Alignment: Normal. Skull base and vertebrae: No acute fracture. No primary bone lesion or focal pathologic process. Soft tissues and spinal canal: No prevertebral fluid or swelling. No visible canal hematoma. Disc levels:  Normal. Upper chest: Negative. Other: None. IMPRESSION: No evidence of acute traumatic injury to the cervical spine. Electronically Signed   By: Ted Mcalpine M.D.   On: 08/24/2020 17:13   MR ANGIO HEAD WO CONTRAST  Result Date: 08/24/2020 CLINICAL DATA:  Right-sided facial droop and right-sided weakness EXAM: MRI HEAD WITHOUT CONTRAST MRA HEAD WITHOUT CONTRAST TECHNIQUE: Multiplanar, multi-echo pulse sequences of the brain and surrounding structures were acquired without intravenous contrast. Angiographic images of the Circle of Willis were acquired using MRA technique without intravenous contrast. COMPARISON: No pertinent prior exam. COMPARISON:  None FINDINGS: MRI HEAD DWI and sagittal T1 sequences were obtained. Patient could not tolerate remainder. There is a subcentimeter infarct involving the posterior limb of the left internal capsule. Additional area of diffusion hyperintensity without restriction along the left periatrial white matter. MRA HEAD Motion artifact is present. Intracranial internal carotid arteries and proximal anterior and middle cerebral arteries appear patent. Intracranial vertebral  arteries, basilar artery, and proximal left posterior cerebral artery appear patent. Right P1 PCA is patent. There is poor flow related enhancement of the remainder of the proximal right PCA. IMPRESSION: Acute lacunar type infarct involving the posterior limb of the left internal capsule. Additional small subacute lacunar type infarct involving the left periatrial white matter. Motion degraded MRA of the head. Possible  stenosis of the proximal right PCA. Electronically Signed   By: Guadlupe Spanish M.D.   On: 08/24/2020 19:09   MR BRAIN WO CONTRAST  Result Date: 08/24/2020 CLINICAL DATA:  Right-sided facial droop and right-sided weakness EXAM: MRI HEAD WITHOUT CONTRAST MRA HEAD WITHOUT CONTRAST TECHNIQUE: Multiplanar, multi-echo pulse sequences of the brain and surrounding structures were acquired without intravenous contrast. Angiographic images of the Circle of Willis were acquired using MRA technique without intravenous contrast. COMPARISON: No pertinent prior exam. COMPARISON:  None FINDINGS: MRI HEAD DWI and sagittal T1 sequences were obtained. Patient could not tolerate remainder. There is a subcentimeter infarct involving the posterior limb of the left internal capsule. Additional area of diffusion hyperintensity without restriction along the left periatrial white matter. MRA HEAD Motion artifact is present. Intracranial internal carotid arteries and proximal anterior and middle cerebral arteries appear patent. Intracranial vertebral arteries, basilar artery, and proximal left posterior cerebral artery appear patent. Right P1 PCA is patent. There is poor flow related enhancement of the remainder of the proximal right PCA. IMPRESSION: Acute lacunar type infarct involving the posterior limb of the left internal capsule. Additional small subacute lacunar type infarct involving the left periatrial white matter. Motion degraded MRA of the head. Possible stenosis of the proximal right PCA. Electronically Signed    By: Guadlupe Spanish M.D.   On: 08/24/2020 19:09    Procedures .Critical Care Performed by: Mancel Bale, MD Authorized by: Mancel Bale, MD   Critical care provider statement:    Critical care time (minutes):  50   Critical care start time:  08/24/2020 4:20 PM   Critical care end time:  08/24/2020 7:56 PM   Critical care time was exclusive of:  Separately billable procedures and treating other patients   Critical care was necessary to treat or prevent imminent or life-threatening deterioration of the following conditions:  CNS failure or compromise   Critical care was time spent personally by me on the following activities:  Blood draw for specimens, development of treatment plan with patient or surrogate, discussions with consultants, evaluation of patient's response to treatment, examination of patient, obtaining history from patient or surrogate, ordering and performing treatments and interventions, ordering and review of laboratory studies, pulse oximetry, re-evaluation of patient's condition, review of old charts and ordering and review of radiographic studies     Medications Ordered in ED Medications  labetalol (NORMODYNE) injection 20 mg (has no administration in time range)  aspirin EC tablet 325 mg (325 mg Oral Not Given 08/24/20 1954)  clopidogrel (PLAVIX) tablet 75 mg (75 mg Oral Not Given 08/24/20 1954)  sodium chloride 0.9 % bolus 1,000 mL (0 mLs Intravenous Stopped 08/24/20 1925)    ED Course  I have reviewed the triage vital signs and the nursing notes.  Pertinent labs & imaging results that were available during my care of the patient were reviewed by me and considered in my medical decision making (see chart for details).    MDM Rules/Calculators/A&P                           Patient Vitals for the past 24 hrs:  BP Temp Temp src Pulse Resp SpO2 Height Weight  08/24/20 1945 (!) 153/89 -- -- 82 19 96 % -- --  08/24/20 1922 (!) 165/112 -- -- (!) 102 (!) 21 -- -- --   08/24/20 1913 (!) 162/96 -- -- 98 (!) 21 99 % -- --  08/24/20 1900 Marland Kitchen)  180/105 -- -- (!) 103 (!) 22 -- -- --  08/24/20 1800 (!) 203/150 -- -- (!) 109 (!) 23 97 % -- --  08/24/20 1700 (!) 170/111 98.7 F (37.1 C) Oral 96 (!) 25 93 % -- --  08/24/20 1656 -- 97.7 F (36.5 C) Oral -- -- 92 % -- --  08/24/20 1634 (!) 182/127 -- -- 98 (!) 29 -- -- --  08/24/20 1628 -- -- -- -- -- -- 5\' 4"  (1.626 m) 115.7 kg    7:31 PM Reevaluation with update and discussion. After initial assessment and treatment, an updated evaluation reveals she remained stable, no change in clinical status. Mancel BaleElliott Raveen Wieseler   7:38 PM-neuro hospitalist return call, Dr. Selina CooleyStack states that the patient can be admitted at [facility] for further care and treatment of her acute CVA.  She remarked that would tolerate systolic blood pressure 220, and not to lower it any further at this time.  Medical Decision Making:  This patient is presenting for evaluation of suspected stroke, which does require a range of treatment options, and is a complaint that involves a high risk of morbidity and mortality. The differential diagnoses include TIA, CVA, hypertensive urgency, acute intracranial abnormality. I decided to review old records, and in summary middle-aged female presenting with fall, after onset of right-sided weakness.  I did not require additional historical information from anyone.  Clinical Laboratory Tests Ordered, included CBC, Metabolic panel, Urinalysis and Drug screen, coagulation panel. Review indicates normal except glucose elevated. Radiologic Tests Ordered, included CTA head, MRI brain, MRA head.  I independently Visualized: Radiographic images, which show left-sided lacunar infarcts, no bleed  Cardiac Monitor Tracing which shows normal sinus rhythm    Critical Interventions-clinical evaluation, laboratory testing, CT imaging, secondary advanced imaging, laboratory testing, observation and reassess  After These  Interventions, the Patient was reevaluated and was found with acute left-sided CVA, causing right-sided hemiplegia.  Out of stroke window for tPA.  No injury to cervical spine when fell yesterday.  No complication related to lying on the floor since, yesterday.  CRITICAL CARE-yes Performed by: Mancel BaleElliott Vontae Court  Nursing Notes Reviewed/ Care Coordinated Applicable Imaging Reviewed Interpretation of Laboratory Data incorporated into ED treatment   7:47 PM-Consult complete with hospitalist. Patient case explained and discussed.  He agrees to admit patient for further evaluation and treatment. Call ended at 7:55 PM    Final Clinical Impression(s) / ED Diagnoses Final diagnoses:  Cerebrovascular accident (CVA), unspecified mechanism (HCC)  Hypertension, unspecified type  Hypoxia    Rx / DC Orders ED Discharge Orders    None       Mancel BaleWentz, Yeng Perz, MD 08/24/20 [phone]

## 2020-08-25 ENCOUNTER — Other Ambulatory Visit (HOSPITAL_COMMUNITY): Payer: Self-pay | Admitting: *Deleted

## 2020-08-25 ENCOUNTER — Inpatient Hospital Stay (HOSPITAL_COMMUNITY): Payer: 59

## 2020-08-25 DIAGNOSIS — I6389 Other cerebral infarction: Secondary | ICD-10-CM

## 2020-08-25 DIAGNOSIS — I639 Cerebral infarction, unspecified: Secondary | ICD-10-CM | POA: Diagnosis not present

## 2020-08-25 DIAGNOSIS — I5042 Chronic combined systolic (congestive) and diastolic (congestive) heart failure: Secondary | ICD-10-CM | POA: Diagnosis present

## 2020-08-25 LAB — HEMOGLOBIN A1C
Hgb A1c MFr Bld: 9.8 % — ABNORMAL HIGH (ref 4.8–5.6)
Mean Plasma Glucose: 234.56 mg/dL

## 2020-08-25 LAB — HEPATIC FUNCTION PANEL
ALT: 20 U/L (ref 0–44)
AST: 17 U/L (ref 15–41)
Albumin: 3.2 g/dL — ABNORMAL LOW (ref 3.5–5.0)
Alkaline Phosphatase: 72 U/L (ref 38–126)
Bilirubin, Direct: 0.2 mg/dL (ref 0.0–0.2)
Indirect Bilirubin: 1 mg/dL — ABNORMAL HIGH (ref 0.3–0.9)
Total Bilirubin: 1.2 mg/dL (ref 0.3–1.2)
Total Protein: 6.7 g/dL (ref 6.5–8.1)

## 2020-08-25 LAB — ECHOCARDIOGRAM COMPLETE BUBBLE STUDY
Area-P 1/2: 4.57 cm2
S' Lateral: 4.7 cm

## 2020-08-25 LAB — GLUCOSE, CAPILLARY
Glucose-Capillary: 128 mg/dL — ABNORMAL HIGH (ref 70–99)
Glucose-Capillary: 193 mg/dL — ABNORMAL HIGH (ref 70–99)
Glucose-Capillary: 174 mg/dL — ABNORMAL HIGH (ref 70–99)
Glucose-Capillary: 172 mg/dL — ABNORMAL HIGH (ref 70–99)
Glucose-Capillary: 179 mg/dL — ABNORMAL HIGH (ref 70–99)

## 2020-08-25 LAB — LIPID PANEL
Cholesterol: 181 mg/dL (ref 0–200)
HDL: 39 mg/dL — ABNORMAL LOW (ref >40)
LDL Cholesterol: 110 mg/dL — ABNORMAL HIGH (ref 0–99)
Total CHOL/HDL Ratio: 4.6 RATIO
Triglycerides: 160 mg/dL — ABNORMAL HIGH (ref <150)
VLDL: 32 mg/dL (ref 0–40)

## 2020-08-25 IMAGING — US US CAROTID DUPLEX BILAT
1 series · 14 of 24 positions shown · non-contrast
Comparison: None.

CLINICAL DATA: Acute ischemic stroke

EXAM:
BILATERAL CAROTID DUPLEX ULTRASOUND
TECHNIQUE: Gray scale imaging, color Doppler and duplex ultrasound were
performed of bilateral carotid and vertebral arteries in the neck.

[Series 1: us carotid bilateral · 14 of 72 slices shown]
[im 1/72]
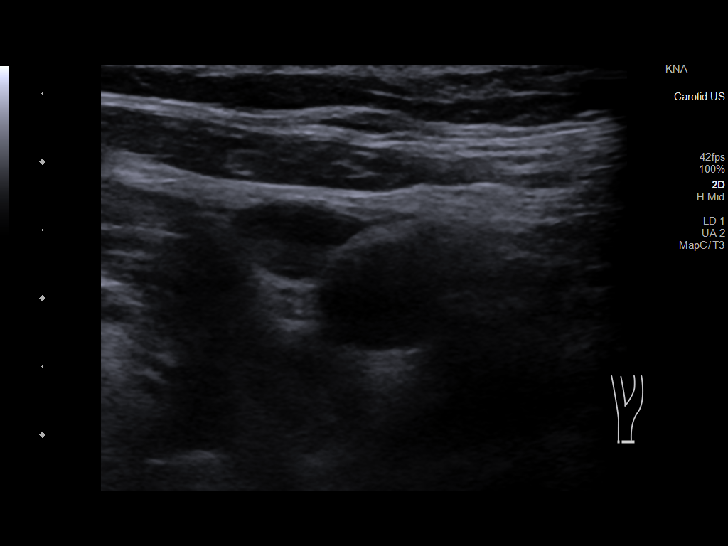
[im 7/72]
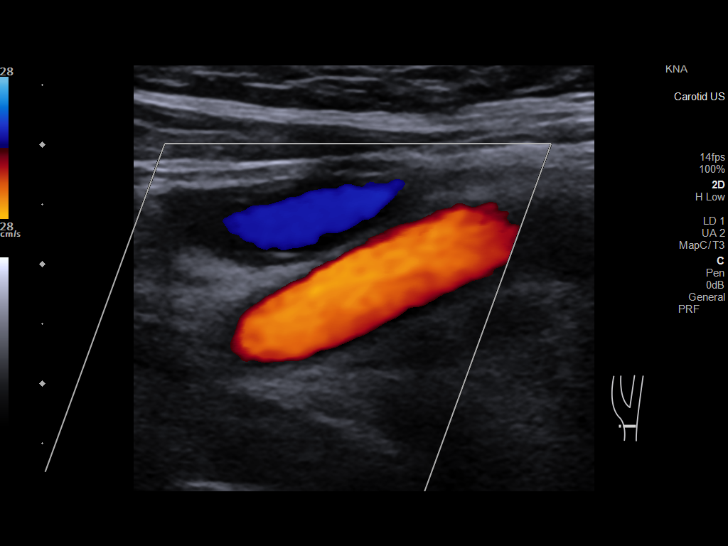
[im 13/72]
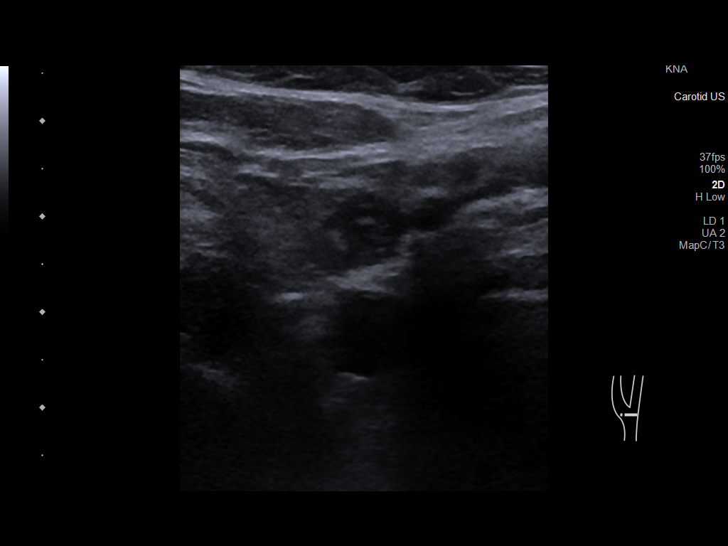
[im 19/72]
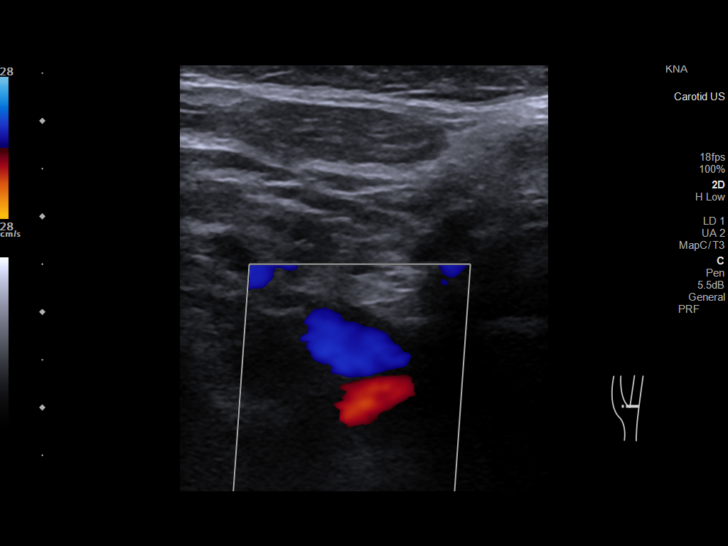
[im 22/72]
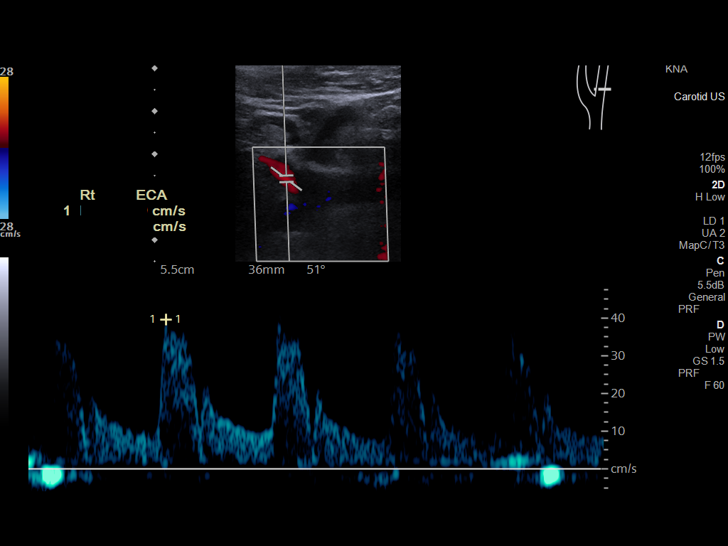
[im 28/72]
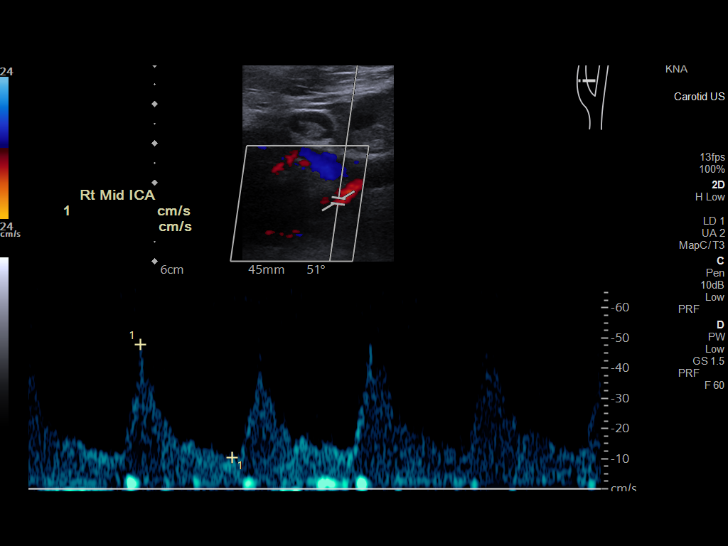
[im 34/72]
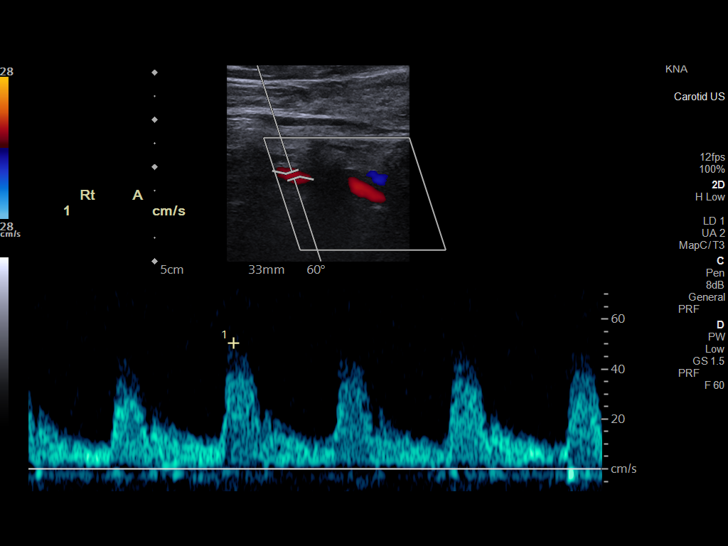
[im 38/72]
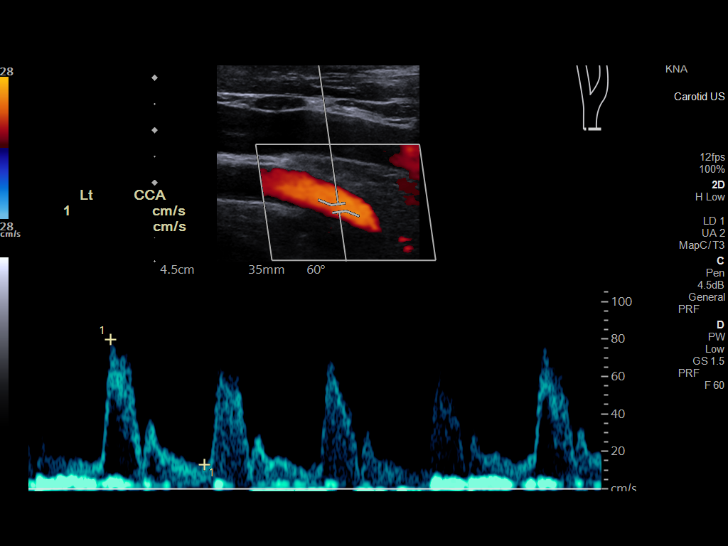
[im 44/72]
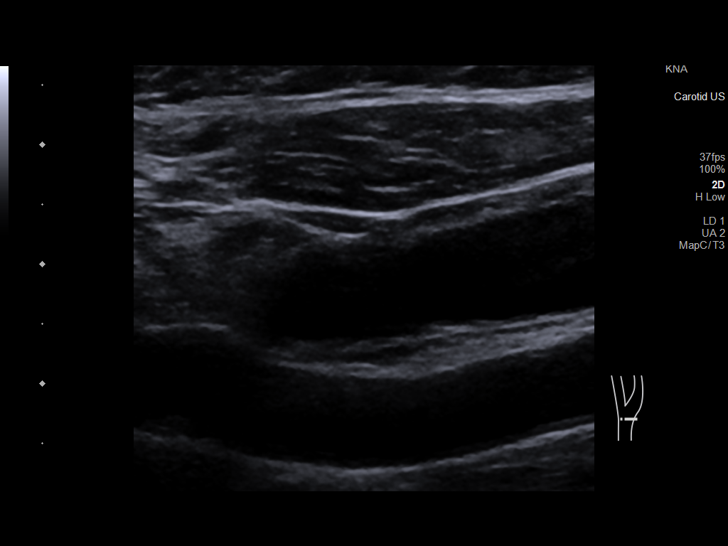
[im 50/72]
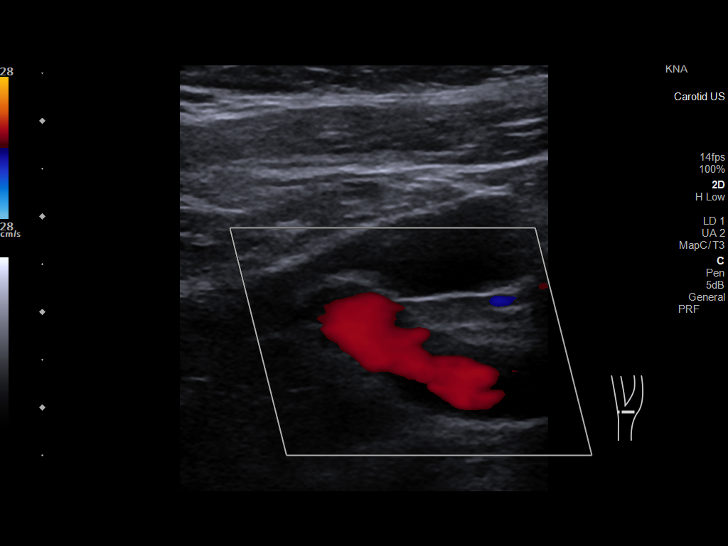
[im 56/72]
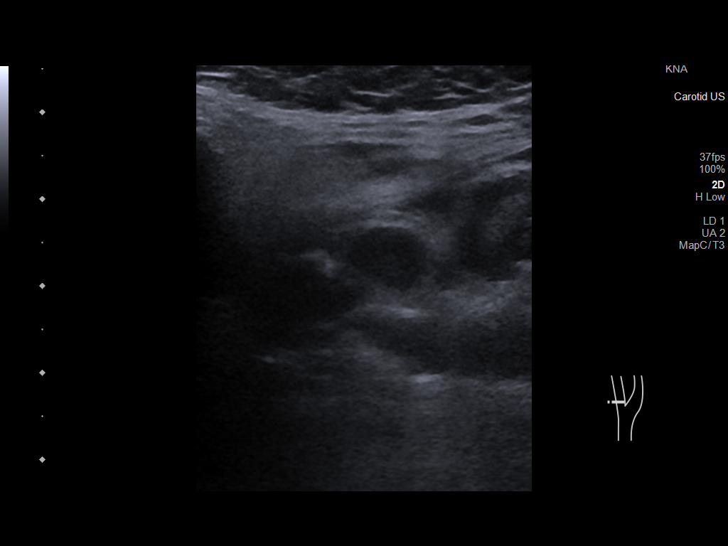
[im 59/72]
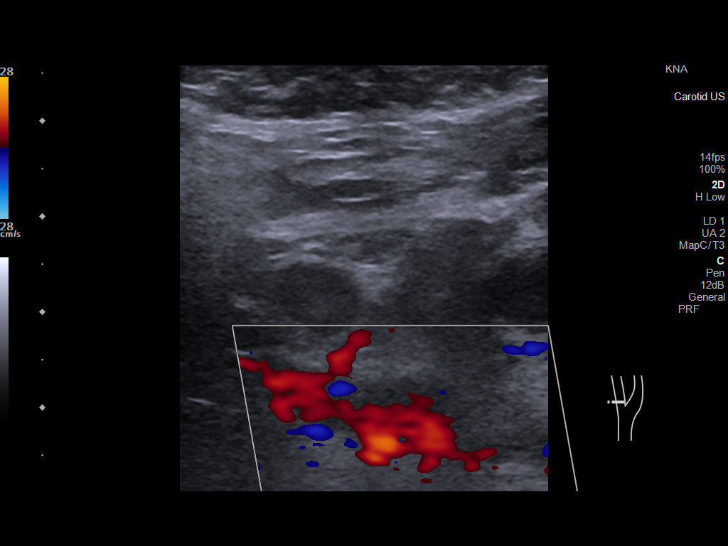
[im 65/72]
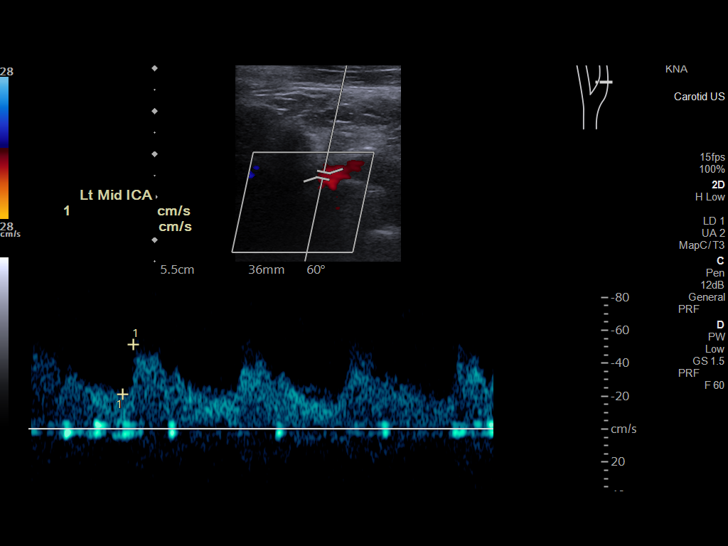
[im 72/72]
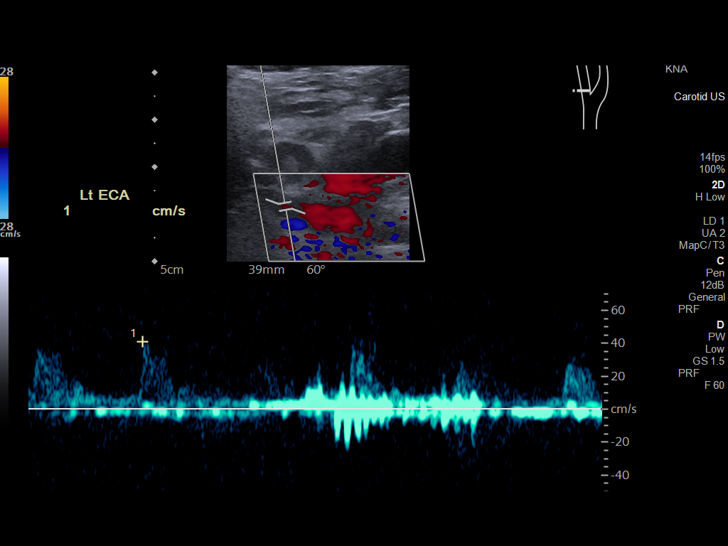

[14 of 24 positions shown; findings below may reference images not displayed]

FINDINGS: Criteria: Quantification of carotid stenosis is based on velocity
parameters that correlate the residual internal carotid diameter
with NASCET-based stenosis levels, using the diameter of the distal
internal carotid lumen as the denominator for stenosis measurement.

The following velocity measurements were obtained:

RIGHT

ICA: 48/10 cm/sec

CCA: 52/8 cm/sec

SYSTOLIC ICA/CCA RATIO:

ECA: 40 cm/sec

LEFT

ICA: 57/25 cm/sec

CCA: 69/13 cm/sec

SYSTOLIC ICA/CCA RATIO:

ECA: 41 cm/sec

RIGHT CAROTID ARTERY: No significant atheromatous plaque.

RIGHT VERTEBRAL ARTERY:  Antegrade flow.

LEFT CAROTID ARTERY:  No significant atheromatous plaque.

LEFT VERTEBRAL ARTERY:  Antegrade flow.
IMPRESSION: No significant stenosis internal carotid arteries.

## 2020-08-25 MED ORDER — LEVOTHYROXINE SODIUM 50 MCG PO TABS
50.0000 ug | ORAL_TABLET | Freq: Every day | ORAL | Status: DC
  Administered 2020-08-27: 50 ug via ORAL
  Filled 2020-08-25: qty 2
  Filled 2020-08-25: qty 1

## 2020-08-25 MED ORDER — ASPIRIN 300 MG RE SUPP
300.0000 mg | Freq: Once | RECTAL | Status: AC
  Administered 2020-08-25: 300 mg via RECTAL
  Filled 2020-08-25: qty 1

## 2020-08-25 MED ORDER — CHLORHEXIDINE GLUCONATE CLOTH 2 % EX PADS
6.0000 | MEDICATED_PAD | Freq: Every day | CUTANEOUS | Status: DC
  Administered 2020-08-25 – 2020-09-07 (×12): 6 via TOPICAL

## 2020-08-25 MED ORDER — CLOPIDOGREL BISULFATE 75 MG PO TABS
75.0000 mg | ORAL_TABLET | Freq: Every day | ORAL | Status: DC
  Administered 2020-08-25 – 2020-09-07 (×14): 75 mg via ORAL
  Filled 2020-08-25 (×15): qty 1

## 2020-08-25 MED ORDER — SALINE SPRAY 0.65 % NA SOLN
1.0000 | NASAL | Status: DC | PRN
  Administered 2020-08-30 – 2020-08-31 (×2): 1 via NASAL
  Filled 2020-08-25 (×2): qty 44

## 2020-08-25 MED ORDER — LABETALOL HCL 5 MG/ML IV SOLN
10.0000 mg | INTRAVENOUS | Status: DC | PRN
  Administered 2020-08-26 – 2020-08-31 (×6): 10 mg via INTRAVENOUS
  Filled 2020-08-25 (×7): qty 4

## 2020-08-25 MED ORDER — ATORVASTATIN CALCIUM 40 MG PO TABS
40.0000 mg | ORAL_TABLET | Freq: Every day | ORAL | Status: DC
  Administered 2020-08-26 – 2020-09-07 (×13): 40 mg via ORAL
  Filled 2020-08-25 (×15): qty 1

## 2020-08-25 MED ORDER — CARVEDILOL 3.125 MG PO TABS
3.1250 mg | ORAL_TABLET | Freq: Two times a day (BID) | ORAL | Status: DC
  Administered 2020-08-26: 3.125 mg via ORAL
  Filled 2020-08-25: qty 1

## 2020-08-25 MED ORDER — ASPIRIN EC 81 MG PO TBEC
81.0000 mg | DELAYED_RELEASE_TABLET | Freq: Every day | ORAL | Status: DC
  Administered 2020-08-26 – 2020-09-07 (×13): 81 mg via ORAL
  Filled 2020-08-25 (×15): qty 1

## 2020-08-25 MED ORDER — LORAZEPAM 2 MG/ML IJ SOLN
0.7500 mg | Freq: Once | INTRAMUSCULAR | Status: AC
  Administered 2020-08-25: 0.75 mg via INTRAVENOUS
  Filled 2020-08-25: qty 1

## 2020-08-25 MED ORDER — INSULIN ASPART 100 UNIT/ML IJ SOLN
0.0000 [IU] | Freq: Three times a day (TID) | INTRAMUSCULAR | Status: DC
  Administered 2020-08-25 (×3): 4 [IU] via SUBCUTANEOUS
  Administered 2020-08-26: 7 [IU] via SUBCUTANEOUS
  Administered 2020-08-26: 4 [IU] via SUBCUTANEOUS
  Administered 2020-08-26 – 2020-08-27 (×2): 3 [IU] via SUBCUTANEOUS
  Administered 2020-08-27 – 2020-08-28 (×2): 4 [IU] via SUBCUTANEOUS
  Administered 2020-08-28: 3 [IU] via SUBCUTANEOUS
  Administered 2020-08-28 – 2020-08-29 (×4): 4 [IU] via SUBCUTANEOUS
  Administered 2020-08-30: 7 [IU] via SUBCUTANEOUS
  Administered 2020-08-30 – 2020-08-31 (×2): 3 [IU] via SUBCUTANEOUS
  Administered 2020-08-31 (×2): 4 [IU] via SUBCUTANEOUS
  Administered 2020-09-01: 3 [IU] via SUBCUTANEOUS
  Administered 2020-09-01: 4 [IU] via SUBCUTANEOUS
  Administered 2020-09-01: 7 [IU] via SUBCUTANEOUS
  Administered 2020-09-02 (×2): 11 [IU] via SUBCUTANEOUS
  Administered 2020-09-03: 4 [IU] via SUBCUTANEOUS
  Administered 2020-09-03: 11 [IU] via SUBCUTANEOUS
  Administered 2020-09-03 – 2020-09-04 (×2): 4 [IU] via SUBCUTANEOUS
  Administered 2020-09-04: 7 [IU] via SUBCUTANEOUS
  Administered 2020-09-04: 4 [IU] via SUBCUTANEOUS
  Administered 2020-09-05: 3 [IU] via SUBCUTANEOUS
  Administered 2020-09-05 (×2): 4 [IU] via SUBCUTANEOUS
  Administered 2020-09-06: 7 [IU] via SUBCUTANEOUS
  Administered 2020-09-06: 3 [IU] via SUBCUTANEOUS
  Administered 2020-09-06 – 2020-09-07 (×3): 4 [IU] via SUBCUTANEOUS

## 2020-08-25 NOTE — Evaluation (Signed)
Speech Language Pathology Evaluation Patient Details Name: Connie Shepard MRN: [id number] DOB: 1966/03/15 Today's Date: 08/25/2020 Time: 3382-5053 SLP Time Calculation (min) (ACUTE ONLY): 27 min  Problem List:  Patient Active Problem List   Diagnosis Date Noted  . Chronic combined systolic and diastolic congestive heart failure (HCC) 08/25/2020  . Acute CVA (cerebrovascular accident) (HCC) 08/24/2020  . Borderline prolonged QT interval 08/24/2020  . Type 2 diabetes mellitus with complication, without long-term current use of insulin (HCC) 03/16/2020  . Anasarca   . Diabetes mellitus (HCC)   . Hypokalemia   . Vitamin D deficiency   . Hypothyroidism   . Physical deconditioning   . Acute on chronic systolic CHF (congestive heart failure) (HCC) 03/09/2020  . Acute on chronic systolic HF (heart failure) (HCC) 03/09/2020  . Hyperglycemia 12/22/2018  . Hypertensive crisis 04/04/2018  . Acute CHF (congestive heart failure) (HCC) 04/04/2018  . Mild renal insufficiency 04/04/2018  . Elevated troponin 04/04/2018  . Bronchitis   . Obesity   . Seborrheic dermatitis of scalp 05/23/2016  . Essential hypertension 12/02/2015  . Depression 12/02/2015  . Primary insomnia 12/02/2015  . Morbid obesity with body mass index of 50.0-59.9 in adult (HCC) 12/02/2015  . Attention deficit hyperactivity disorder (ADHD), predominantly inattentive type 12/02/2015   Past Medical History:  Past Medical History:  Diagnosis Date  . Acute heart failure (HCC)   . ADD (attention deficit disorder)   . Bronchitis   . Depression   . Diabetes mellitus without complication (HCC)   . Elevated troponin   . Hypertension   . Obesity    Past Surgical History:  Past Surgical History:  Procedure Laterality Date  . CESAREAN SECTION     HPI:  Connie Shepard is a 55 y.o. female with medical history significant of chronic combined systolic and diastolic heart failure, elevated troponin, hypertension, ADD, history of  bronchitis, depression, class III obesity who was brought to the emergency department due to right-sided weakness after having a fall at home.  She was last seen well the previous date at 1700.  Her family became concerned and called EMS.  She initially refused to come to the ED.  She has motor aphasia and is unable to provide further information at this time.SLE requested. 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 Additional small subacute lacunar type infarct  involving the left periatrial white matter.   Assessment / Plan / Recommendation Clinical Impression  Pt presents with mild to mild/mod dysarthria characterized by reduced breath support and imprecise articulation resulting in reduced speech intelligibility for sentences and conversation. Pt with right facial asymmetry. Suspect mild attention deficits, however this will need to be further assessed . Pt is aware of her speech deficits, however seems less appreciative of current swallowing impairment (slight impulsivity with intake and coughing/throat clearing with thins). Recommend continued speech therapy to address the above deficits and also for dysphagia (in acute and next level of care).    SLP Assessment  SLP Recommendation/Assessment: Patient needs continued Speech Lanaguage Pathology Services SLP Visit Diagnosis: Dysarthria and anarthria (R47.1);Cognitive communication deficit (R41.841)    Follow Up Recommendations  Inpatient Rehab    Frequency and Duration min 2x/week  1 week      SLP Evaluation Cognition  Overall Cognitive Status: Impaired/Different from baseline Arousal/Alertness: Awake/alert Orientation Level: Oriented X4 Attention: Sustained Sustained Attention:  (missed one month when saying in reverse) Memory: Appears intact Awareness: Impaired Awareness Impairment: Anticipatory impairment Problem Solving:  Appears intact Safety/Judgment: Appears intact        Comprehension  Auditory Comprehension Overall Auditory Comprehension: Appears within functional limits for tasks assessed Yes/No Questions: Within Functional Limits Commands: Within Functional Limits Conversation: Complex Interfering Components: Attention Visual Recognition/Discrimination Discrimination: Within Function Limits Reading Comprehension Reading Status: Not tested    Expression Expression Primary Mode of Expression: Verbal Verbal Expression Overall Verbal Expression: Appears within functional limits for tasks assessed Initiation: No impairment Automatic Speech: Name;Social Response;Month of year Level of Generative/Spontaneous Verbalization: Conversation Repetition: No impairment Naming: No impairment Pragmatics: No impairment Interfering Components: Speech intelligibility Non-Verbal Means of Communication: Not applicable Written Expression Dominant Hand: Right Written Expression: Not tested   Oral / Motor  Oral Motor/Sensory Function Overall Oral Motor/Sensory Function: Moderate impairment Facial ROM: Reduced right;Suspected CN VII (facial) dysfunction Facial Symmetry: Abnormal symmetry right;Suspected CN VII (facial) dysfunction Facial Strength: Reduced right;Suspected CN VII (facial) dysfunction Facial Sensation: Reduced right Lingual ROM: Within Functional Limits Lingual Symmetry: Within Functional Limits Lingual Strength: Within Functional Limits Lingual Sensation: Reduced Velum: Within Functional Limits Mandible: Within Functional Limits Motor Speech Overall Motor Speech: Impaired Respiration: Impaired Level of Impairment: Sentence Phonation: Normal Resonance: Within functional limits Articulation: Impaired Level of Impairment: Sentence Intelligibility: Intelligibility reduced Word: 75-100% accurate Phrase: 75-100% accurate Sentence: 75-100% accurate Conversation: 50-74% accurate Motor Planning: Witnin functional limits Motor Speech Errors:  Aware Effective Techniques: Slow rate;Over-articulate;Pause   Thank you,  Havery Moros, CCC-SLP [phone]                     Connie Shepard 08/25/2020, 4:45 PM

## 2020-08-25 NOTE — Progress Notes (Signed)
Pt admitted to ICU fron ER.Connie Shepard  Pt did not have any belongings with her, no family or friends present.,  Pt had been given ativan in ER and was very sleepy, unable to answer questioins.  Pt did attempt to follow commands and has no movement on her right right side,  Pt is talking , speech is understandable but is slurred .

## 2020-08-25 NOTE — Progress Notes (Signed)
*  PRELIMINARY RESULTS* Echocardiogram 2D Echocardiogram has been performed with saline bubble study.  Stacey Drain 08/25/2020, 1:48 PM

## 2020-08-25 NOTE — Evaluation (Signed)
Clinical/Bedside Swallow Evaluation Patient Details  Name: Connie Shepard MRN: [id number] Date of Birth: 09-01-1965  Today's Date: 08/25/2020 Time: SLP Start Time (ACUTE ONLY): 1400 SLP Stop Time (ACUTE ONLY): 1423 SLP Time Calculation (min) (ACUTE ONLY): 23 min  Past Medical History:  Past Medical History:  Diagnosis Date  . Acute heart failure (HCC)   . ADD (attention deficit disorder)   . Bronchitis   . Depression   . Diabetes mellitus without complication (HCC)   . Elevated troponin   . Hypertension   . Obesity    Past Surgical History:  Past Surgical History:  Procedure Laterality Date  . CESAREAN SECTION     HPI:  Connie Shepard is a 56 y.o. female with medical history significant of chronic combined systolic and diastolic heart failure, elevated troponin, hypertension, ADD, history of bronchitis, depression, class III obesity who was brought to the emergency department due to right-sided weakness after having a fall at home.  She was last seen well the previous date at 1700.  Her family became concerned and called EMS.  She initially refused to come to the ED.  She has motor aphasia and is unable to provide further information at this time.SLE requested. 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 Additional small subacute lacunar type infarct  involving the left periatrial white matter.   Assessment / Plan / Recommendation Clinical Impression  Clinical swallow evaluation completed at bedside. Oral motor examination reveals right facial asymmetry with right facial weakness. Pt with reduced breath support and mild to mild/mod dysarthria. She presents with throat clearing and coughing after ice chips and thin water despite cues to take small sips (slightly impulsive but also very thirsty and talkative). Pt with improved performance with nectar thick liquids with some mild throat clearing. Will inititiate D3/mech soft and nectar thick  liquids via small cup sips, no straws, po medications whole with puree or NTL, aspiration and reflux precautions. Will plan to complete MBSS tomorrow if clinically appropriate. Pt and RN in agreement with plan of care. SLP Visit Diagnosis: Dysphagia, unspecified (R13.10)    Aspiration Risk  Moderate aspiration risk    Diet Recommendation Dysphagia 3 (Mech soft);Nectar-thick liquid   Liquid Administration via: Cup;No straw Medication Administration: Whole meds with liquid Supervision: Patient able to self feed;Full supervision/cueing for compensatory strategies Compensations: Slow rate;Small sips/bites Postural Changes: Seated upright at 90 degrees;Remain upright for at least 30 minutes after po intake    Other  Recommendations Oral Care Recommendations: Oral care BID Other Recommendations: Clarify dietary restrictions;Order thickener from pharmacy;Prohibited food (jello, ice cream, thin soups)   Follow up Recommendations Inpatient Rehab      Frequency and Duration min 2x/week  1 week       Prognosis Prognosis for Safe Diet Advancement: Good      Swallow Study   General Date of Onset: 08/24/20 HPI: Connie Shepard is a 55 y.o. female with medical history significant of chronic combined systolic and diastolic heart failure, elevated troponin, hypertension, ADD, history of bronchitis, depression, class III obesity who was brought to the emergency department due to right-sided weakness after having a fall at home.  She was last seen well the previous date at 1700.  Her family became concerned and called EMS.  She initially refused to come to the ED.  She has motor aphasia and is unable to provide further information at this time.SLE requested. MRI shows:Acute lacunar type infarct involving the posterior limb of  the left  internal capsule. Additional small subacute lacunar type infarct  involving the left periatrial white matter. Type of Study: Bedside Swallow Evaluation Previous Swallow  Assessment: N/A Diet Prior to this Study: NPO Temperature Spikes Noted: No Respiratory Status: Nasal cannula History of Recent Intubation: No Behavior/Cognition: Alert;Cooperative;Pleasant mood Oral Cavity Assessment: Within Functional Limits Oral Care Completed by SLP: Yes Oral Cavity - Dentition: Adequate natural dentition Vision: Functional for self-feeding Self-Feeding Abilities: Able to feed self Patient Positioning: Upright in bed Baseline Vocal Quality: Normal Volitional Cough: Strong Volitional Swallow: Able to elicit    Oral/Motor/Sensory Function Overall Oral Motor/Sensory Function: Moderate impairment Facial ROM: Reduced right;Suspected CN VII (facial) dysfunction Facial Symmetry: Abnormal symmetry right;Suspected CN VII (facial) dysfunction Facial Strength: Reduced right;Suspected CN VII (facial) dysfunction Facial Sensation: Reduced right Lingual ROM: Within Functional Limits Lingual Symmetry: Within Functional Limits Lingual Strength: Within Functional Limits Lingual Sensation: Reduced Velum: Within Functional Limits Mandible: Within Functional Limits   Ice Chips Ice chips: Impaired Presentation: Spoon Pharyngeal Phase Impairments: Throat Clearing - Immediate   Thin Liquid Thin Liquid: Impaired Presentation: Cup;Straw;Self Fed Pharyngeal  Phase Impairments: Suspected delayed Swallow;Cough - Delayed;Cough - Immediate;Throat Clearing - Immediate    Nectar Thick Nectar Thick Liquid: Impaired Presentation: Cup;Self Fed Pharyngeal Phase Impairments: Throat Clearing - Delayed   Honey Thick Honey Thick Liquid: Not tested   Puree Puree: Within functional limits Presentation: Spoon   Solid     Solid: Impaired Oral Phase Impairments: Reduced lingual movement/coordination Oral Phase Functional Implications: Prolonged oral transit Pharyngeal Phase Impairments: Throat Clearing - Delayed     Thank you,  Havery Moros,  CCC-SLP [phone]  Nysia Dell 08/25/2020,2:53 PM

## 2020-08-25 NOTE — Plan of Care (Signed)
  Problem: SLP Dysphagia Goals Goal: Patient will utilize recommended strategies Description: Patient will utilize recommended strategies during swallow to increase swallowing safety with Flowsheets (Taken 08/25/2020 1647) Patient will utilize recommended strategies during swallow to increase swallowing safety with: modified independence   Problem: SLP Language Goals Goal: Patient will utilize speech intelligibility Description: Patient will utilize speech intelligibility strategies to  enhance communication with Flowsheets (Taken 08/25/2020 1647) Patient will utilize speech intelligibility strategies to enhance communication with ____: min assist    Thank you,  Havery Moros, CCC-SLP [phone]

## 2020-08-25 NOTE — Plan of Care (Signed)
  Problem: Acute Rehab OT Goals (only OT should resolve) Goal: Pt. Will Perform Eating Flowsheets (Taken 08/25/2020 1059) Pt Will Perform Eating:  with set-up  sitting  bed level Goal: Pt. Will Perform Grooming Flowsheets (Taken 08/25/2020 1059) Pt Will Perform Grooming:  with min assist  with supervision  sitting Goal: Pt. Will Perform Upper Body Dressing Flowsheets (Taken 08/25/2020 1059) Pt Will Perform Upper Body Dressing:  with min assist  with supervision  with adaptive equipment  sitting Goal: Pt. Will Perform Lower Body Dressing Flowsheets (Taken 08/25/2020 1059) Pt Will Perform Lower Body Dressing:  with mod assist  with adaptive equipment  sitting/lateral leans  bed level Goal: Pt. Will Transfer To Toilet Flowsheets (Taken 08/25/2020 1059) Pt Will Transfer to Toilet:  with min assist  grab bars  stand pivot transfer Goal: Pt/Caregiver Will Perform Home Exercise Program Flowsheets (Taken 08/25/2020 1059) Pt/caregiver will Perform Home Exercise Program:  Right Upper extremity  Increased ROM  With minimal assist Goal: OT Additional ADL Goal #1 Flowsheets (Taken 08/25/2020 1059) Additional ADL Goal #1: Pt will demonstrated improved postural stability by sitting upright at EOB with SPV and completing lateral leans with Min guard to SPV while demosntrated R UE weight bearing to assist with lateral leans.  Azure Budnick OT, MOT

## 2020-08-25 NOTE — Progress Notes (Signed)
TRH night shift.  The nursing staff reported that the patient has some restlessness following prochlorperazine 10 mg IVP administration.  Her QT interval was mildly prolonged earlier, so we will use lorazepam 0.75 mg IVP x1 dose ordered.  Sanda Klein, MD.

## 2020-08-25 NOTE — Evaluation (Signed)
Occupational Therapy Evaluation Patient Details Name: Connie Shepard MRN: [id number] DOB: 01-09-1966 Today's Date: 08/25/2020    History of Present Illness HPI: Connie Shepard is a 55 y.o. female with medical history significant of chronic combined systolic and diastolic heart failure, elevated troponin, hypertension, ADD, history of bronchitis, depression, class III obesity who was brought to the emergency department due to right-sided weakness after having a fall at home.  She was last seen well the previous date at 1700.  Her family became concerned and called EMS.  She initially refused to come to the ED.  She has motor aphasia and is unable to provide further information at this time.   Clinical Impression   Pt agreeable to OT/PT co-evaluation this date. Pt oriented x3 but presents wit expressive difficulties via slurred speech. Pt demonstrates a flaccid R UE with not trace movements of shoulder, elbow, wrist, or digits. Pt required maximal support for bed mobility and sit to stand. Moderate to maximal support for stand pivot transfer. Prior to hospitalization pt was mostly independent other them hand held assist for ambulation at times. Pt currently requires total assist to don socks at bed level. Pt demonstrates poor seated EOB postural stability but with noted improvements with cuing and increased time. Pt will benefit from continued OT in the hospital and recommended venue below to increase strength, balance, ROM, and endurance for safe ADL's.     Follow Up Recommendations  CIR    Equipment Recommendations  None recommended by OT           Precautions / Restrictions Precautions Precautions: Fall Precaution Comments: R side hemiparesis Restrictions Weight Bearing Restrictions: No      Mobility Bed Mobility Overal bed mobility: Needs Assistance Bed Mobility: Supine to Sit     Supine to sit: Max assist     General bed mobility comments: slow labored movement; assist to move R  UE and LE    Transfers Overall transfer level: Needs assistance Equipment used: Rolling walker (2 wheeled);1 person hand held assist Transfers: Sit to/from [employer] Sit to Stand: Mod assist Stand pivot transfers: Mod assist;Max assist       General transfer comment: required right knee blocked to avoid falling, unable to hold onto walker with right hand due to weakness, required stand pivot to transfer to chair    Balance Overall balance assessment: Needs assistance Sitting-balance support: Feet supported;No upper extremity supported Sitting balance-Leahy Scale: Poor Sitting balance - Comments: seated at EOB Postural control: Right lateral lean;Posterior lean Standing balance support: During functional activity;Single extremity supported Standing balance-Leahy Scale: Poor Standing balance comment: holding onto RW with left hand                           ADL either performed or assessed with clinical judgement   ADL Overall ADL's : Needs assistance/impaired Eating/Feeding: Set up;Minimal assistance;Sitting;Bed level   Grooming: Wash/dry hands;Wash/dry face;Oral care;Moderate assistance;Sitting;Applying deodorant;Brushing hair;Bed level   Upper Body Bathing: Moderate assistance;Sitting   Lower Body Bathing: Bed level;Maximal assistance   Upper Body Dressing : Moderate assistance;Sitting   Lower Body Dressing: Total assistance;Bed level Lower Body Dressing Details (indicate cue type and reason): total assist to don socks at bed level Toilet Transfer: Maximal assistance;Stand-pivot;Moderate assistance Toilet Transfer Details (indicate cue type and reason): simulated via EOB to chair transfer Toileting- Clothing Manipulation and Hygiene: Total assistance;Bed level   Tub/ Shower Transfer: Maximal assistance;Total assistance;Tub bench   Functional mobility during  ADLs: Maximal assistance General ADL Comments: Clinical judgement used for all but  lower body dressing which was actually observed.     Vision Baseline Vision/History: No visual deficits Patient Visual Report: No change from baseline Vision Assessment?: Yes Tracking/Visual Pursuits: Other (comment) (Able to track in all fields but mild lack of fluidity with mild saccadic movement with tracking.) Convergence: Impaired (comment) (B eyes drifing to L side when attempting convergence.) Visual Fields: No apparent deficits                Pertinent Vitals/Pain Pain Assessment: No/denies pain     Hand Dominance Right   Extremity/Trunk Assessment Upper Extremity Assessment Upper Extremity Assessment: Defer to OT evaluation RUE Deficits / Details: R UE completely flaccid with no trace movement. WFL P/ROM grossly. RUE Sensation: WNL RUE Coordination: decreased fine motor;decreased gross motor LUE Deficits / Details: WFL; 4+/5 grossly LUE Sensation: WNL LUE Coordination: WNL   Lower Extremity Assessment Lower Extremity Assessment: Generalized weakness;RLE deficits/detail;LLE deficits/detail RLE Deficits / Details: grossly 2/5 except 0/5 ankle dorsiflexion RLE Sensation: WNL RLE Coordination: WNL LLE Deficits / Details: grossly 4+/5 LLE Sensation: WNL LLE Coordination: WNL   Cervical / Trunk Assessment Cervical / Trunk Assessment: Normal   Communication Communication Communication: Expressive difficulties   Cognition Arousal/Alertness: Awake/alert Behavior During Therapy: WFL for tasks assessed/performed Overall Cognitive Status: Within Functional Limits for tasks assessed                                 General Comments: oriented x3   General Comments                  Home Living Family/patient expects to be discharged to:: Private residence Living Arrangements: Parent Available Help at Discharge: Family;Available 24 hours/day Type of Home: House Home Access: Stairs to enter [employer] of Steps: 3 Entrance  Stairs-Rails: None Home Layout: Multi-level;Able to live on main level with bedroom/bathroom Alternate Level Stairs-Number of Steps: 12 to 2nd floor, 12 to 3rd floor Alternate Level Stairs-Rails: Right Bathroom Shower/Tub: Tub/shower unit   Bathroom Toilet: Handicapped height     Home Equipment: Shower seat;Bedside commode;Cane - single point;Walker - 2 wheels;Grab bars - tub/shower;Wheelchair - manual   Additional Comments: at baseline does not use assistive device      Prior Functioning/Environment Level of Independence: Independent;Independent with assistive device(s)  Gait / Transfers Assistance Needed: Pt reports she has hand held assist by mother when ambulating at times. ADL's / Homemaking Assistance Needed: Reportedly independent for ADL's and IADL's ; pt was driving   Comments: household and short distanced community ambulator with family member providing hand held assistance        OT Problem List: Decreased strength;Decreased range of motion;Decreased activity tolerance;Impaired balance (sitting and/or standing);Decreased coordination;Decreased knowledge of use of DME or AE;Impaired tone;Obesity;Impaired UE functional use      OT Treatment/Interventions: Self-care/ADL training;Therapeutic exercise;Neuromuscular education;DME and/or AE instruction;Manual therapy;Modalities;Splinting;Therapeutic activities;Patient/family education;Balance training;Visual/perceptual remediation/compensation    OT Goals(Current goals can be found in the care plan section) Acute Rehab OT Goals Patient Stated Goal: return home after rehab OT Goal Formulation: With patient Time For Goal Achievement: 09/08/20 Potential to Achieve Goals: Fair  OT Frequency: Min 2X/week               Co-evaluation PT/OT/SLP Co-Evaluation/Treatment: Yes Reason for Co-Treatment: Complexity of the patient's impairments (multi-system involvement);For patient/therapist safety;To address functional/ADL  transfers PT goals addressed during session:  Mobility/safety with mobility;Balance;Proper use of DME OT goals addressed during session: ADL's and self-care;Strengthening/ROM      AM-PAC OT "6 Clicks" Daily Activity     Outcome Measure Help from another person eating meals?: A Lot Help from another person taking care of personal grooming?: A Lot Help from another person toileting, which includes using toliet, bedpan, or urinal?: Total Help from another person bathing (including washing, rinsing, drying)?: A Lot Help from another person to put on and taking off regular upper body clothing?: A Lot Help from another person to put on and taking off regular lower body clothing?: Total 6 Click Score: 10   End of Session Equipment Utilized During Treatment: Rolling walker Nurse Communication: Mobility status  Activity Tolerance: Patient tolerated treatment well Patient left: in chair;with chair alarm set;with call bell/phone within reach  OT Visit Diagnosis: Unsteadiness on feet (R26.81);Other abnormalities of gait and mobility (R26.89);Other symptoms and signs involving the nervous system (R29.898);Cognitive communication deficit (R41.841);Hemiplegia and hemiparesis Symptoms and signs involving cognitive functions: Cerebral infarction Hemiplegia - Right/Left: Right Hemiplegia - dominant/non-dominant: Dominant Hemiplegia - caused by: Cerebral infarction                Time: 6962-9528 OT Time Calculation (min): 36 min Charges:  OT General Charges $OT Visit: 1 Visit OT Evaluation $OT Eval Moderate Complexity: 1 Mod  Luzmaria Devaux OT, MOT   [employer] 08/25/2020, 10:56 AM

## 2020-08-25 NOTE — Evaluation (Signed)
Physical Therapy Evaluation Patient Details Name: Connie Shepard MRN: [id number] DOB: 1965-08-24 Today's Date: 08/25/2020   History of Present Illness  HPI: Connie Shepard is a 55 y.o. female with medical history significant of chronic combined systolic and diastolic heart failure, elevated troponin, hypertension, ADD, history of bronchitis, depression, class III obesity who was brought to the emergency department due to right-sided weakness after having a fall at home.  She was last seen well the previous date at 1700.  Her family became concerned and called EMS.  She initially refused to come to the ED.  She has motor aphasia and is unable to provide further information at this time.    Clinical Impression  Patient demonstrates slow labored movement for sitting up at bedside with inability to use LUE due to weakness, frequent leaning/falling to right and backwards, when concentrating able to maintain trunk in midline for up to 2-3 minutes before fatiguing, very unsteady on feet with frequent buckling of right knee when standing and required right knee blocked and bilateral handheld assist holding onto patient's trunk to complete transfer to chair.  Patient able to take a few side steps having to shuffle/drag right foot during transfer.  Patient tolerated sitting up in chair after therapy - RN notified.  Patient will benefit from continued physical therapy in hospital and recommended venue below to increase strength, balance, endurance for safe ADLs and gait.      Follow Up Recommendations CIR    Equipment Recommendations  None recommended by PT    Recommendations for Other Services       Precautions / Restrictions Precautions Precautions: Fall Precaution Comments: R side hemiparesis Restrictions Weight Bearing Restrictions: No      Mobility  Bed Mobility Overal bed mobility: Needs Assistance Bed Mobility: Supine to Sit     Supine to sit: Max assist          Transfers Overall  transfer level: Needs assistance Equipment used: Rolling walker (2 wheeled);1 person hand held assist Transfers: Sit to/from [employer] Sit to Stand: Mod assist Stand pivot transfers: Mod assist;Max assist       General transfer comment: required right knee blocked to avoid falling, unable to hold onto walker with right hand due to weakness, required stand pivot to transfer to chair  Ambulation/Gait Ambulation/Gait assistance: Max assist Gait Distance (Feet): 3 Feet Assistive device: 1 person hand held assist Gait Pattern/deviations: Decreased step length - right;Decreased step length - left;Decreased stance time - right;Decreased stride length;Shuffle Gait velocity: decreased   General Gait Details: limited to a few side steps with hand held assist holding onto patient's trunk with right knee blocked, had to suffle right foot due to weakness  Stairs            Wheelchair Mobility    Modified Rankin (Stroke Patients Only)       Balance Overall balance assessment: Needs assistance Sitting-balance support: Feet supported;No upper extremity supported Sitting balance-Leahy Scale: Poor Sitting balance - Comments: seated at EOB Postural control: Right lateral lean;Posterior lean Standing balance support: During functional activity;Single extremity supported Standing balance-Leahy Scale: Poor Standing balance comment: holding onto RW with left hand                             Pertinent Vitals/Pain Pain Assessment: No/denies pain    Home Living Family/patient expects to be discharged to:: Private residence Living Arrangements: Parent Available Help at Discharge: Family;Available 24 hours/day Type  of Home: House Home Access: Stairs to enter Entrance Stairs-Rails: None Entrance Stairs-Number of Steps: 3 Home Layout: Multi-level;Able to live on main level with bedroom/bathroom Home Equipment: Shower seat;Bedside commode;Cane - single  point;Walker - 2 wheels;Grab bars - tub/shower;Wheelchair - manual Additional Comments: at baseline does not use assistive device    Prior Function Level of Independence: Independent;Independent with assistive device(s)   Gait / Transfers Assistance Needed: Pt reports she has hand held assist by mother when ambulating at times.  ADL's / Homemaking Assistance Needed: Reportedly independent for ADL's and IADL's ; pt was driving  Comments: household and short distanced community ambulator with family member providing hand held assistance     Hand Dominance   Dominant Hand: Right    Extremity/Trunk Assessment   Upper Extremity Assessment Upper Extremity Assessment: Defer to OT evaluation RUE Deficits / Details: R UE completely flaccid with no trace movement. WFL P/ROM grossly. RUE Sensation: WNL RUE Coordination: decreased fine motor;decreased gross motor LUE Deficits / Details: WFL; 4+/5 grossly LUE Sensation: WNL LUE Coordination: WNL    Lower Extremity Assessment Lower Extremity Assessment: Generalized weakness;RLE deficits/detail;LLE deficits/detail RLE Deficits / Details: grossly 2/5 except 0/5 ankle dorsiflexion RLE Sensation: WNL RLE Coordination: WNL LLE Deficits / Details: grossly 4+/5 LLE Sensation: WNL LLE Coordination: WNL    Cervical / Trunk Assessment Cervical / Trunk Assessment: Normal  Communication   Communication: Expressive difficulties  Cognition Arousal/Alertness: Awake/alert Behavior During Therapy: WFL for tasks assessed/performed Overall Cognitive Status: Within Functional Limits for tasks assessed                                 General Comments: oriented x3      General Comments      Exercises     Assessment/Plan    PT Assessment Patient needs continued PT services  PT Problem List Decreased strength;Decreased activity tolerance;Decreased balance;Decreased mobility       PT Treatment Interventions DME instruction;Gait  training;Stair training;Functional mobility training;Therapeutic activities;Therapeutic exercise;Patient/family education;Balance training    PT Goals (Current goals can be found in the Care Plan section)  Acute Rehab PT Goals Patient Stated Goal: return home after rehab PT Goal Formulation: With patient Time For Goal Achievement: 09/08/20 Potential to Achieve Goals: Good    Frequency Min 5X/week   Barriers to discharge        Co-evaluation PT/OT/SLP Co-Evaluation/Treatment: Yes Reason for Co-Treatment: Complexity of the patient's impairments (multi-system involvement);For patient/therapist safety;To address functional/ADL transfers PT goals addressed during session: Mobility/safety with mobility;Balance;Proper use of DME OT goals addressed during session: ADL's and self-care;Strengthening/ROM       AM-PAC PT "6 Clicks" Mobility  Outcome Measure Help needed turning from your back to your side while in a flat bed without using bedrails?: A Lot Help needed moving from lying on your back to sitting on the side of a flat bed without using bedrails?: A Lot Help needed moving to and from a bed to a chair (including a wheelchair)?: A Lot Help needed standing up from a chair using your arms (e.g., wheelchair or bedside chair)?: A Lot Help needed to walk in hospital room?: A Lot Help needed climbing 3-5 steps with a railing? : Total 6 Click Score: 11    End of Session   Activity Tolerance: Patient tolerated treatment well;Patient limited by fatigue Patient left: in chair;with call bell/phone within reach Nurse Communication: Mobility status PT Visit Diagnosis: Unsteadiness on feet (R26.81);Other  abnormalities of gait and mobility (R26.89);Muscle weakness (generalized) (M62.81)    Time: 4765-4650 PT Time Calculation (min) (ACUTE ONLY): 33 min   Charges:   PT Evaluation $PT Eval Moderate Complexity: 1 Mod PT Treatments $Therapeutic Activity: 23-37 mins        10:55 AM,  08/25/20 Ocie Bob, MPT Physical Therapist with [facility] 336 [phone] office [phone] mobile phone

## 2020-08-25 NOTE — Progress Notes (Signed)
Inpatient Diabetes Program Recommendations  AACE/ADA: New Consensus Statement on Inpatient Glycemic Control (2015)  Target Ranges:  Prepandial:   less than 140 mg/dL      Peak postprandial:   less than 180 mg/dL (1-2 hours)      Critically ill patients:  140 - 180 mg/dL   Lab Results  Component Value Date   GLUCAP 174 (H) 08/25/2020   HGBA1C 9.8 (H) 08/25/2020    Review of Glycemic Control Results for QUENNA, DOEPKE (MRN [id number]) as of 08/25/2020 14:23  Ref. Range 08/24/2020 16:25 08/25/2020 04:23 08/25/2020 08:07 08/25/2020 12:25  Glucose-Capillary Latest Ref Range: 70 - 99 mg/dL 941 (H) 740 (H) 814 (H) 174 (H)   Diabetes history: DM2 Outpatient Diabetes medications: Metformin 500 mg qd Current orders for Inpatient glycemic control: Novolog correction 0-20 units tid  Inpatient Diabetes Program Recommendations:   While patient is NPO: -Change Novolog correction to 0-9 units q 4 hrs.  Thank you, Connie Shepard. Hoover Grewe, RN, MSN, CDE  Diabetes Coordinator Inpatient Glycemic Control Team Team Pager [phone] (8am-5pm) 08/25/2020 2:29 PM

## 2020-08-25 NOTE — Progress Notes (Signed)
Patient Demographics:    Connie Shepard, is a 55 y.o. female, DOB - 02/06/1966, QMV:[id number]  Admit date - 08/24/2020   Admitting Physician Bobette Mo, MD  Outpatient Primary MD for the patient is Toma Deiters, MD  LOS - 1   Chief Complaint  Patient presents with  . Altered Mental Status        Subjective:    Connie Shepard today has no fevers, no emesis,  No chest pain, \ -Resting comfortably  Assessment  & Plan :    Principal Problem:   Acute CVA (cerebrovascular accident) [facility]) Active Problems:   Essential hypertension   Depression   Hypothyroidism   Type 2 diabetes mellitus with complication, without long-term current use of insulin (HCC)   Borderline prolonged QT interval   Chronic combined systolic and diastolic congestive heart failure (HCC)  Brief Summary: 55 y.o. female with medical history significant of chronic combined systolic and diastolic heart failure, morbid obesity, hypertension, ADD, history of bronchitis, depression, admitted on 08/24/2020 with acute stroke  A/p 1) Acute CVA--  MRI brain shows Acute lacunar type infarct involving the posterior limb of the left internal capsule. Additional small subacute lacunar type infarct involving the left periatrial white matter. -Echo with EF of 20%, with global hypokinesis -Agitated bubble study without evidence of interatrial shunt -MRA head without LVO, carotid artery Dopplers without hemodynamically significant stenosis -On aspirin, Plavix, Lipitor -PT and OT eval appreciated recommends acute inpatient rehab  2)FEN--- speech and swallow eval appreciated, recommended Dysphagia 3 (Mech soft);Nectar-thick liquid   3)DM2-  -Hold metformin Use Novolog/Humalog Sliding scale insulin with - Accu-Cheks/Fingersticks as ordered   4)HTN--allow permissive hypertension   -- Hold losartan, hydralazine, Aldactone, IV labetalol  as needed with parameters  5)HFrEF--EF in the 20% range, hold losartan, Entresto, hydralazine and torsemide as well as Aldactone to avoid overtreating BP given acute stroke -Okay to restart Coreg at a lower dose of 3.125 mg twice daily  6) depression/insomnia----hold Lexapro and trazodone due to prolonged QT -Hold Wellbutrin  7) hypothyroidism--continue levothyroxine 50 mcg daily  Disposition/Need for in-Hospital Stay- patient unable to be discharged at this time due to -acute stroke requiring acute inpatient rehab pending bed availability*  Status is: Inpatient  Remains inpatient appropriate because:Please see disposition above   Disposition: The patient is from: Home              Anticipated d/c is to: CIR              Anticipated d/c date is: 1 day              Patient currently is not medically stable to d/c. Barriers: Not Clinically Stable-   Code Status :  -  Code Status: Full Code   Family Communication:     (patient is alert, awake and coherent)   Consults  :  Neurology  DVT Prophylaxis  :   - SCDs   enoxaparin (LOVENOX) injection 40 mg Start: 08/25/20 1000    Lab Results  Component Value Date   PLT 202 08/24/2020    Inpatient Medications  Scheduled Meds: . aspirin EC  325 mg Oral Once  . aspirin EC  81 mg Oral Q breakfast  . atorvastatin  40 mg Oral Daily  . Chlorhexidine Gluconate Cloth  6 each Topical Daily  . clopidogrel  75 mg Oral Once  . clopidogrel  75 mg Oral Daily  . enoxaparin (LOVENOX) injection  40 mg Subcutaneous Q24H  . insulin aspart  0-20 Units Subcutaneous TID WC   Continuous Infusions: PRN Meds:.acetaminophen **OR** acetaminophen, labetalol    Anti-infectives (From admission, onward)   None        Objective:   Vitals:   08/25/20 1200 08/25/20 1228 08/25/20 1300 08/25/20 1628  BP: (!) 166/104  (!) 197/124   Pulse: 83 85 79 87  Resp: 18 20 (!) 22 (!) 23  Temp:  98.6 F (37 C)  (!) 97.5 F (36.4 C)  TempSrc:  Axillary  Oral   SpO2: 98% 100% 100% 95%  Weight:      Height:        Wt Readings from Last 3 Encounters:  08/25/20 115 kg  03/18/20 118.1 kg  03/13/20 (!) 142.6 kg     Intake/Output Summary (Last 24 hours) at 08/25/2020 1855 Last data filed at 08/25/2020 0600 Gross per 24 hour  Intake --  Output 1000 ml  Net -1000 ml     Physical Exam  Gen:- Awake Alert,   in no acute distress  HEENT:- [place].AT, No sclera icterus Neck-Supple Neck,No JVD,.  Lungs-mostly clear, fair symmetrical air movement CV- S1, S2 normal, regular  Abd-  +ve B.Sounds, Abd Soft, No tenderness, increased truncal adiposity    Extremity/Skin:- No  edema, pedal pulses present = Psych-affect is appropriate, oriented x3 Neuro-right-sided hemiparesis speech and swallowing concerns   Data Review:   Micro Results Recent Results (from the past 240 hour(s))  Resp Panel by RT-PCR (Flu A&B, Covid) Nasopharyngeal Swab     Status: None   Collection Time: 08/24/20  4:24 PM   Specimen: Nasopharyngeal Swab; Nasopharyngeal(NP) swabs in vial transport medium  Result Value Ref Range Status   SARS Coronavirus 2 by RT PCR NEGATIVE NEGATIVE Final    Comment: (NOTE) SARS-CoV-2 target nucleic acids are NOT DETECTED.  The SARS-CoV-2 RNA is generally detectable in upper respiratory specimens during the acute phase of infection. The lowest concentration of SARS-CoV-2 viral copies this assay can detect is 138 copies/mL. A negative result does not preclude SARS-Cov-2 infection and should not be used as the sole basis for treatment or other patient management decisions. A negative result may occur with  improper specimen collection/handling, submission of specimen other than nasopharyngeal swab, presence of viral mutation(s) within the areas targeted by this assay, and inadequate number of viral copies(<138 copies/mL). A negative result must be combined with clinical observations, patient history, and epidemiological information. The expected  result is Negative.  Fact Sheet for Patients:  BloggerCourse.comhttps://www.fda.gov/media/152166/download  Fact Sheet for Healthcare Providers:  SeriousBroker.ithttps://www.fda.gov/media/152162/download  This test is no t yet approved or cleared by the [place] FDA and  has been authorized for detection and/or diagnosis of SARS-CoV-2 by FDA under an Emergency Use Authorization (EUA). This EUA will remain  in effect (meaning this test can be used) for the duration of the COVID-19 declaration under Section 564(b)(1) of the Act, 21 U.S.C.section 360bbb-3(b)(1), unless the authorization is terminated  or revoked sooner.       Influenza A by PCR NEGATIVE NEGATIVE Final   Influenza B by PCR NEGATIVE NEGATIVE Final    Comment: (NOTE) The Xpert Xpress SARS-CoV-2/FLU/RSV plus assay is intended as an aid in the diagnosis of influenza from Nasopharyngeal swab specimens and  should not be used as a sole basis for treatment. Nasal washings and aspirates are unacceptable for Xpert Xpress SARS-CoV-2/FLU/RSV testing.  Fact Sheet for Patients: BloggerCourse.com  Fact Sheet for Healthcare Providers: SeriousBroker.it  This test is not yet approved or cleared by the [place] FDA and has been authorized for detection and/or diagnosis of SARS-CoV-2 by FDA under an Emergency Use Authorization (EUA). This EUA will remain in effect (meaning this test can be used) for the duration of the COVID-19 declaration under Section 564(b)(1) of the Act, 21 U.S.C. section 360bbb-3(b)(1), unless the authorization is terminated or revoked.  Performed at [facility], [address] [place], [place] [address]     Radiology Reports CT HEAD WO CONTRAST  Result Date: 08/24/2020 CLINICAL DATA:  Acute neural deficit. EXAM: CT HEAD WITHOUT CONTRAST TECHNIQUE: Contiguous axial images were obtained from the base of the skull through the vertex without intravenous contrast. COMPARISON:   March 11, 2020 FINDINGS: Brain: No evidence of acute hemorrhage, hydrocephalus, extra-axial collection or mass lesion/mass effect. Areas of hypoattenuation in the subcortical matter of the bilateral frontal lobes and right occipital lobe may represent asymmetric microvascular ischemic changes or other white matter disease. Vascular: Calcific atherosclerotic disease of the intra cavernous carotid arteries. Skull: Normal. Negative for fracture or focal lesion. Sinuses/Orbits: No acute finding. Other: None. IMPRESSION: 1. Areas of hypoattenuation in the subcortical matter of the bilateral frontal lobes and right occipital lobe may represent asymmetric microvascular ischemic changes or other white matter disease. 2. No evidence of acute intracranial hemorrhage. Electronically Signed   By: Ted Mcalpine M.D.   On: 08/24/2020 17:10   CT CERVICAL SPINE WO CONTRAST  Result Date: 08/24/2020 CLINICAL DATA:  Status post fall. EXAM: CT CERVICAL SPINE WITHOUT CONTRAST TECHNIQUE: Multidetector CT imaging of the cervical spine was performed without intravenous contrast. Multiplanar CT image reconstructions were also generated. COMPARISON:  None. FINDINGS: Alignment: Normal. Skull base and vertebrae: No acute fracture. No primary bone lesion or focal pathologic process. Soft tissues and spinal canal: No prevertebral fluid or swelling. No visible canal hematoma. Disc levels:  Normal. Upper chest: Negative. Other: None. IMPRESSION: No evidence of acute traumatic injury to the cervical spine. Electronically Signed   By: Ted Mcalpine M.D.   On: 08/24/2020 17:13   MR ANGIO HEAD WO CONTRAST  Result Date: 08/24/2020 CLINICAL DATA:  Right-sided facial droop and right-sided weakness EXAM: MRI HEAD WITHOUT CONTRAST MRA HEAD WITHOUT CONTRAST TECHNIQUE: Multiplanar, multi-echo pulse sequences of the brain and surrounding structures were acquired without intravenous contrast. Angiographic images of the Circle of Willis  were acquired using MRA technique without intravenous contrast. COMPARISON: No pertinent prior exam. COMPARISON:  None FINDINGS: MRI HEAD DWI and sagittal T1 sequences were obtained. Patient could not tolerate remainder. There is a subcentimeter infarct involving the posterior limb of the left internal capsule. Additional area of diffusion hyperintensity without restriction along the left periatrial white matter. MRA HEAD Motion artifact is present. Intracranial internal carotid arteries and proximal anterior and middle cerebral arteries appear patent. Intracranial vertebral arteries, basilar artery, and proximal left posterior cerebral artery appear patent. Right P1 PCA is patent. There is poor flow related enhancement of the remainder of the proximal right PCA. IMPRESSION: Acute lacunar type infarct involving the posterior limb of the left internal capsule. Additional small subacute lacunar type infarct involving the left periatrial white matter. Motion degraded MRA of the head. Possible stenosis of the proximal right PCA. Electronically Signed   By: Jackquline Berlin.D.  On: 08/24/2020 19:09   MR BRAIN WO CONTRAST  Result Date: 08/24/2020 CLINICAL DATA:  Right-sided facial droop and right-sided weakness EXAM: MRI HEAD WITHOUT CONTRAST MRA HEAD WITHOUT CONTRAST TECHNIQUE: Multiplanar, multi-echo pulse sequences of the brain and surrounding structures were acquired without intravenous contrast. Angiographic images of the Circle of Willis were acquired using MRA technique without intravenous contrast. COMPARISON: No pertinent prior exam. COMPARISON:  None FINDINGS: MRI HEAD DWI and sagittal T1 sequences were obtained. Patient could not tolerate remainder. There is a subcentimeter infarct involving the posterior limb of the left internal capsule. Additional area of diffusion hyperintensity without restriction along the left periatrial white matter. MRA HEAD Motion artifact is present. Intracranial internal  carotid arteries and proximal anterior and middle cerebral arteries appear patent. Intracranial vertebral arteries, basilar artery, and proximal left posterior cerebral artery appear patent. Right P1 PCA is patent. There is poor flow related enhancement of the remainder of the proximal right PCA. IMPRESSION: Acute lacunar type infarct involving the posterior limb of the left internal capsule. Additional small subacute lacunar type infarct involving the left periatrial white matter. Motion degraded MRA of the head. Possible stenosis of the proximal right PCA. Electronically Signed   By: Guadlupe Spanish M.D.   On: 08/24/2020 19:09   US Carotid Bilateral (at [facility] and [facility] only)  Result Date: 08/25/2020 CLINICAL DATA:  Acute ischemic stroke EXAM: BILATERAL CAROTID DUPLEX ULTRASOUND TECHNIQUE: Wallace Cullens scale imaging, color Doppler and duplex ultrasound were performed of bilateral carotid and vertebral arteries in the neck. COMPARISON:  None. FINDINGS: Criteria: Quantification of carotid stenosis is based on velocity parameters that correlate the residual internal carotid diameter with NASCET-based stenosis levels, using the diameter of the distal internal carotid lumen as the denominator for stenosis measurement. The following velocity measurements were obtained: RIGHT ICA: 48/10 cm/sec CCA: 52/8 cm/sec SYSTOLIC ICA/CCA RATIO:  0.9 ECA: 40 cm/sec LEFT ICA: 57/25 cm/sec CCA: 69/13 cm/sec SYSTOLIC ICA/CCA RATIO:  0.8 ECA: 41 cm/sec RIGHT CAROTID ARTERY: No significant atheromatous plaque. RIGHT VERTEBRAL ARTERY:  Antegrade flow. LEFT CAROTID ARTERY:  No significant atheromatous plaque. LEFT VERTEBRAL ARTERY:  Antegrade flow. IMPRESSION: No significant stenosis internal carotid arteries. Electronically Signed   By: Acquanetta Belling M.D.   On: 08/25/2020 10:54   ECHOCARDIOGRAM COMPLETE BUBBLE STUDY  Result Date: 08/25/2020    ECHOCARDIOGRAM REPORT   Patient Name:   Connie Shepard Date of Exam: 08/25/2020 Medical Rec #:  [id number]     Height:       64.0 in Accession #:    [id number]   Weight:       253.5 lb Date of Birth:  1966-04-05   BSA:          2.164 m Patient Age:    54 years     BP:           144/84 mmHg Patient Gender: F            HR:           88 bpm. [facility] Procedure: 2D Echo, Cardiac Doppler, Color Doppler and Saline Contrast Bubble            Study Indications:    Stroke 434.91/l63.9  History:        Patient has prior history of Echocardiogram examinations, most                 recent 03/09/2020. Risk Factors:Hypertension and Diabetes. Acute  CVA (cerebrovascular accident) [facility]), Chronic combined systolic                 and diastolic congestive heart failure (HCC), Obesity.  Sonographer:    Celesta Gentile RCS Referring Phys: [id number] DAVID MANUEL ORTIZ IMPRESSIONS  1. Left ventricular ejection fraction, by estimation, is approximately 20%. The left ventricle has severely decreased function. The left ventricle demonstrates global hypokinesis. There is moderate left ventricular hypertrophy. Left ventricular diastolic parameters are indeterminate.  2. Right ventricular systolic function is severely reduced. The right ventricular size is normal. Tricuspid regurgitation signal is inadequate for assessing PA pressure.  3. The mitral valve is abnormal, mildly calcified. Mild mitral valve regurgitation.  4. The aortic valve is tricuspid. Aortic valve regurgitation is not visualized.  5. The inferior vena cava is normal in size with <50% respiratory variability, suggesting right atrial pressure of 8 mmHg.  6. Agitated saline contrast bubble study was negative, with no evidence of any interatrial shunt. FINDINGS  Left Ventricle: Left ventricular ejection fraction, by estimation, is 20%. The left ventricle has severely decreased function. The left ventricle demonstrates global hypokinesis. The left ventricular internal cavity size was normal in size. There is moderate left ventricular hypertrophy. Left  ventricular diastolic parameters are indeterminate. Right Ventricle: The right ventricular size is normal. No increase in right ventricular wall thickness. Right ventricular systolic function is severely reduced. Tricuspid regurgitation signal is inadequate for assessing PA pressure. Left Atrium: Left atrial size was normal in size. Right Atrium: Right atrial size was normal in size. Pericardium: There is no evidence of pericardial effusion. Presence of pericardial fat pad. Mitral Valve: The mitral valve is abnormal. There is mild calcification of the mitral valve leaflet(s). Mild mitral annular calcification. Mild mitral valve regurgitation. Tricuspid Valve: The tricuspid valve is grossly normal. Tricuspid valve regurgitation is mild. Aortic Valve: The aortic valve is tricuspid. There is mild aortic valve annular calcification. Aortic valve regurgitation is not visualized. Pulmonic Valve: The pulmonic valve was grossly normal. Pulmonic valve regurgitation is trivial. Aorta: The aortic root is normal in size and structure. Venous: The inferior vena cava is normal in size with less than 50% respiratory variability, suggesting right atrial pressure of 8 mmHg. IAS/Shunts: No atrial level shunt detected by color flow Doppler. Agitated saline contrast was given intravenously to evaluate for intracardiac shunting. Agitated saline contrast bubble study was negative, with no evidence of any interatrial shunt.  LEFT VENTRICLE PLAX 2D LVIDd:         5.40 cm LVIDs:         4.70 cm LV PW:         1.70 cm LV IVS:        1.40 cm LVOT diam:     1.90 cm LV SV:         29 LV SV Index:   13 LVOT Area:     2.84 cm  RIGHT VENTRICLE RV S prime:     8.70 cm/s TAPSE (M-mode): 1.3 cm LEFT ATRIUM             Index       RIGHT ATRIUM           Index LA diam:        4.30 cm 1.99 cm/m  RA Area:     20.80 cm LA Vol (A2C):   80.1 ml 37.02 ml/m RA Volume:   59.50 ml  27.50 ml/m LA Vol (A4C):   61.3 ml 28.33 ml/m LA Biplane Vol:  74.4 ml  34.38 ml/m  AORTIC VALVE LVOT Vmax:   62.00 cm/s LVOT Vmean:  43.500 cm/s LVOT VTI:    0.102 m  AORTA Ao Root diam: 2.90 cm MITRAL VALVE MV Area (PHT): 4.57 cm     SHUNTS MV Decel Time: 166 msec     Systemic VTI:  0.10 m MV E velocity: 107.00 cm/s  Systemic Diam: 1.90 cm MV A velocity: 71.60 cm/s MV E/A ratio:  1.49 Nona Dell MD Electronically signed by Nona Dell MD Signature Date/Time: 08/25/2020/3:54:59 PM    Final      CBC Recent Labs  Lab 08/24/20 1624  WBC 7.2  HGB 14.1  HCT 42.2  PLT 202  MCV 94.2  MCH 31.5  MCHC 33.4  RDW 15.2  LYMPHSABS 1.5  MONOABS 0.3  EOSABS 0.2  BASOSABS 0.1    Chemistries  Recent Labs  Lab 08/24/20 1624 08/25/20 0349  NA 138  --   K 3.6  --   CL 104  --   CO2 24  --   GLUCOSE 207*  --   BUN 16  --   CREATININE 0.96  --   CALCIUM 9.1  --   MG 1.8  --   AST 17 17  ALT 22 20  ALKPHOS 79 72  BILITOT 1.8* 1.2   ------------------------------------------------------------------------------------------------------------------ Recent Labs    08/25/20 0349  CHOL 181  HDL 39*  LDLCALC 110*  TRIG 160*  CHOLHDL 4.6    Lab Results  Component Value Date   HGBA1C 9.8 (H) 08/25/2020   ------------------------------------------------------------------------------------------------------------------ No results for input(s): TSH, T4TOTAL, T3FREE, THYROIDAB in the last 72 hours.  Invalid input(s): FREET3 ------------------------------------------------------------------------------------------------------------------ No results for input(s): VITAMINB12, FOLATE, FERRITIN, TIBC, IRON, RETICCTPCT in the last 72 hours.  Coagulation profile Recent Labs  Lab 08/24/20 1624  INR 1.1    No results for input(s): DDIMER in the last 72 hours.  Cardiac Enzymes No results for input(s): CKMB, TROPONINI, MYOGLOBIN in the last 168 hours.  Invalid input(s):  CK ------------------------------------------------------------------------------------------------------------------    Component Value Date/Time   BNP 699.0 (H) 03/08/2020 1738     Shon Hale M.D on 08/25/2020 at 6:55 PM  Go to [email or web address] - for contact info  [facility] - Office  [phone]

## 2020-08-25 NOTE — ED Notes (Signed)
Pt anxious and uncomfortable. Pt given medication per MAR. Pt turned on side per request and resting comfortably at this time.

## 2020-08-25 NOTE — ED Notes (Signed)
Pt continuously removing equipment and oxygen tubing. Pt placed back on the monitor at this time

## 2020-08-25 NOTE — Plan of Care (Signed)
  Problem: Acute Rehab PT Goals(only PT should resolve) Goal: Pt Will Go Supine/Side To Sit Outcome: Progressing Flowsheets (Taken 08/25/2020 1057) Pt will go Supine/Side to Sit: with moderate assist Goal: Patient Will Perform Sitting Balance Outcome: Progressing Flowsheets (Taken 08/25/2020 1057) Patient will perform sitting balance: with min guard assist Goal: Patient Will Transfer Sit To/From Stand Outcome: Progressing Flowsheets (Taken 08/25/2020 1057) Patient will transfer sit to/from stand:  with minimal assist  with moderate assist Goal: Pt Will Transfer Bed To Chair/Chair To Bed Outcome: Progressing Flowsheets (Taken 08/25/2020 1057) Pt will Transfer Bed to Chair/Chair to Bed:  with min assist  with mod assist Goal: Pt Will Ambulate Outcome: Progressing Flowsheets (Taken 08/25/2020 1057) Pt will Ambulate:  15 feet  with moderate assist  with cane  with rolling walker   10:58 AM, 08/25/20 Ocie Bob, MPT Physical Therapist with [facility] [phone] office [phone] mobile phone

## 2020-08-26 ENCOUNTER — Encounter (HOSPITAL_COMMUNITY): Payer: Self-pay | Admitting: Family Medicine

## 2020-08-26 ENCOUNTER — Inpatient Hospital Stay (HOSPITAL_COMMUNITY): Payer: 59

## 2020-08-26 DIAGNOSIS — I639 Cerebral infarction, unspecified: Secondary | ICD-10-CM | POA: Diagnosis not present

## 2020-08-26 LAB — GLUCOSE, CAPILLARY
Glucose-Capillary: 143 mg/dL — ABNORMAL HIGH (ref 70–99)
Glucose-Capillary: 222 mg/dL — ABNORMAL HIGH (ref 70–99)
Glucose-Capillary: 187 mg/dL — ABNORMAL HIGH (ref 70–99)
Glucose-Capillary: 181 mg/dL — ABNORMAL HIGH (ref 70–99)
Glucose-Capillary: 185 mg/dL — ABNORMAL HIGH (ref 70–99)

## 2020-08-26 MED ORDER — CARVEDILOL 12.5 MG PO TABS
12.5000 mg | ORAL_TABLET | Freq: Two times a day (BID) | ORAL | Status: DC
  Administered 2020-08-26 – 2020-08-27 (×2): 12.5 mg via ORAL
  Filled 2020-08-26 (×3): qty 1

## 2020-08-26 MED ORDER — INSULIN GLARGINE 100 UNIT/ML ~~LOC~~ SOLN
6.0000 [IU] | Freq: Every day | SUBCUTANEOUS | Status: DC
  Administered 2020-08-26 – 2020-09-06 (×12): 6 [IU] via SUBCUTANEOUS
  Filled 2020-08-26 (×13): qty 0.06

## 2020-08-26 MED ORDER — LORAZEPAM 2 MG/ML IJ SOLN
1.0000 mg | Freq: Four times a day (QID) | INTRAMUSCULAR | Status: DC | PRN
  Administered 2020-08-26 – 2020-08-27 (×2): 1 mg via INTRAVENOUS
  Filled 2020-08-26 (×2): qty 1

## 2020-08-26 NOTE — Clinical Social Work Note (Signed)
Patient currently refuses CIR and SNF. Says she will accept HH. Attempted contact with patient's mother, however VM was not set up.    Kina Shiffman, Juleen China, LCSW

## 2020-08-26 NOTE — Progress Notes (Signed)
  Speech Language Pathology Treatment: Dysphagia  Patient Details Name: Levern Kalka MRN: [id number] DOB: [birth year] Today's Date: 08/26/2020 Time: 4098-1191 SLP Time Calculation (min) (ACUTE ONLY): 22.23 min  Assessment / Plan / Recommendation Clinical Impression  Ongoing dysphagia treatment provided today with Pt's breakfast tray of D3/mech soft and NTL; SLP also provided trials of thin liquids. Pt continues to demonstrate wet vocal quality and immediate and delayed throat clearing with thin trials; further note inconsistent delayed throat clearing with NTL also. With mech soft trials note prolonged oral prep stage with mild oral residue after the swallow; Pt benefits from liquid wash to clear residue. Plan to proceed with MBSS this am or as schedule permits today. Thank you,   HPI HPI: Valory Wetherby is a 55 y.o. female with medical history significant of chronic combined systolic and diastolic heart failure, elevated troponin, hypertension, ADD, history of bronchitis, depression, class III obesity who was brought to the emergency department due to right-sided weakness after having a fall at home.  She was last seen well the previous date at 1700.  Her family became concerned and called EMS.  She initially refused to come to the ED.  She has motor aphasia and is unable to provide further information at this time.SLE requested. 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 Additional small subacute lacunar type infarct  involving the left periatrial white matter.      SLP Plan   proceed with MBSS       Recommendations  Diet recommendations: Dysphagia 3 (mechanical soft);Nectar-thick liquid Liquids provided via: Cup;Straw Medication Administration: Whole meds with liquid Supervision: Staff to assist with self feeding;Full supervision/cueing for compensatory strategies Compensations: Slow rate;Small sips/bites                Oral Care  Recommendations: Oral care BID Follow up Recommendations: Inpatient Rehab SLP Visit Diagnosis: Dysarthria and anarthria (R47.1);Cognitive communication deficit (R41.841)       Lydon Vansickle H. Romie Levee, CCC-SLP Speech Language Pathologist       [name] 08/26/2020, 8:16 AM

## 2020-08-26 NOTE — Progress Notes (Signed)
Physical Therapy Treatment Patient Details Name: Connie Shepard MRN: [id number] DOB: 07/14/1965 Today's Date: 08/26/2020    History of Present Illness HPI: Connie Shepard is a 55 y.o. female with medical history significant of chronic combined systolic and diastolic heart failure, elevated troponin, hypertension, ADD, history of bronchitis, depression, class III obesity who was brought to the emergency department due to right-sided weakness after having a fall at home.  She was last seen well the previous date at 1700.  Her family became concerned and called EMS.  She initially refused to come to the ED.  She has motor aphasia and is unable to provide further information at this time.    PT Comments    Patient agreeable for therapy and anxious due to extreme fear of falling.  Patient demonstrates slow labored movement for sitting up at bedside fair/good return for using LUE to prop up on elbow to sitting, demonstrated some movement in RLE during supine to sitting, fair/good balance supporting self with LUE while completing BLE exercises requiring active assistance with RLE.  Patient able to take a few side steps with left knee blocked and right hand held to RW to transfer to chair, limited mostly due to fear of falling and generalized weakness.  Patient tolerated sitting up in chair after therapy.  Patient will benefit from continued physical therapy in hospital and recommended venue below to increase strength, balance, endurance for safe ADLs and gait.     Follow Up Recommendations  CIR     Equipment Recommendations  None recommended by PT    Recommendations for Other Services       Precautions / Restrictions Precautions Precautions: Fall Precaution Comments: R side hemiparesis Restrictions Weight Bearing Restrictions: No    Mobility  Bed Mobility Overal bed mobility: Needs Assistance Bed Mobility: Supine to Sit     Supine to sit: Mod assist;Max assist     General bed mobility  comments: increased time, labored movement    Transfers Overall transfer level: Needs assistance Equipment used: Rolling walker (2 wheeled);1 person hand held assist Transfers: Sit to/from [employer] Sit to Stand: Mod assist Stand pivot transfers: Mod assist;Max assist       General transfer comment: required right knee blocked to avoid falling, unable to hold onto walker with right hand due to weakness, required stand pivot to transfer to chair  Ambulation/Gait Ambulation/Gait assistance: Mod assist;Max assist Gait Distance (Feet): 4 Feet Assistive device: Rolling walker (2 wheeled) Gait Pattern/deviations: Decreased step length - right;Decreased step length - left;Decreased stance time - right;Decreased stride length;Shuffle Gait velocity: decreased   General Gait Details: required right knee blocked to avoid loss of balance and limited to a few side steps before having to sit due to fatigue and anxiety due to fear of falling   Stairs             Wheelchair Mobility    Modified Rankin (Stroke Patients Only)       Balance Overall balance assessment: Needs assistance Sitting-balance support: Feet supported;No upper extremity supported Sitting balance-Leahy Scale: Fair Sitting balance - Comments: fair/good supported with LUE   Standing balance support: During functional activity;Bilateral upper extremity supported Standing balance-Leahy Scale: Poor Standing balance comment: fair/poor using mostly LUE for support on RW                            Cognition Arousal/Alertness: Awake/alert Behavior During Therapy: [facility] for tasks assessed/performed Overall Cognitive Status:  Within Functional Limits for tasks assessed                                        Exercises General Exercises - Lower Extremity Long Arc Quad: Seated;AAROM;Strengthening;Right;10 reps Hip Flexion/Marching: Seated;AAROM;Strengthening;Right;10 reps Toe  Raises: Seated;AROM;Strengthening;Left;10 reps Heel Raises: Seated;AROM;Strengthening;Left;10 reps    General Comments        Pertinent Vitals/Pain Pain Assessment: No/denies pain Pain Score: 3  Pain Location: R arm Pain Descriptors / Indicators: Pins and needles;Numbness    Home Living                      Prior Function            PT Goals (current goals can now be found in the care plan section) Acute Rehab PT Goals Patient Stated Goal: return home after rehab PT Goal Formulation: With patient Time For Goal Achievement: 09/08/20 Potential to Achieve Goals: Good Progress towards PT goals: Progressing toward goals    Frequency    Min 5X/week      PT Plan Current plan remains appropriate    Co-evaluation              AM-PAC PT "6 Clicks" Mobility   Outcome Measure  Help needed turning from your back to your side while in a flat bed without using bedrails?: A Lot Help needed moving from lying on your back to sitting on the side of a flat bed without using bedrails?: A Lot Help needed moving to and from a bed to a chair (including a wheelchair)?: A Lot Help needed standing up from a chair using your arms (e.g., wheelchair or bedside chair)?: A Lot Help needed to walk in hospital room?: A Lot Help needed climbing 3-5 steps with a railing? : Total 6 Click Score: 11    End of Session   Activity Tolerance: Patient tolerated treatment well;Patient limited by fatigue Patient left: in chair;with call bell/phone within reach Nurse Communication: Mobility status PT Visit Diagnosis: Unsteadiness on feet (R26.81);Other abnormalities of gait and mobility (R26.89);Muscle weakness (generalized) (M62.81)     Time: 3016-0109 PT Time Calculation (min) (ACUTE ONLY): 31 min  Charges:  $Therapeutic Exercise: 8-22 mins $Therapeutic Activity: 8-22 mins                     12:33 PM, 08/26/20 Ocie Bob, MPT Physical Therapist with [facility] [phone] office [phone] mobile phone

## 2020-08-26 NOTE — Progress Notes (Signed)
OT Cancellation Note  Patient Details Name: Connie Shepard MRN: [id number] DOB: 02/03/1966   Cancelled Treatment:    Reason Eval/Treat Not Completed: Patient at procedure or test/ unavailable. Pt leaving room for a procedure as OT arrived. Will attempt treatment later as time allows.   Tsuruko Murtha OT, MOT  Danie Chandler 08/26/2020, 9:29 AM

## 2020-08-26 NOTE — Progress Notes (Addendum)
Patient Demographics:    Connie Shepard, is a 55 y.o. female, DOB - [birth year], ZOX:[id number]  Admit date - 08/24/2020   Admitting Physician No admitting provider for patient encounter.  Outpatient Primary MD for the patient is Toma Deiters, MD  LOS - 2   Chief Complaint  Patient presents with  . Altered Mental Status        Subjective:    Connie Shepard today has no fevers, no emesis,  No chest pain,  -More cooperative, much less agitated -Had swallow study- MBS on 08/26/2020  Assessment  & Plan :    Principal Problem:   Acute CVA (cerebrovascular accident) [facility]) Active Problems:   Essential hypertension   Depression   Hypothyroidism   Type 2 diabetes mellitus with complication, without long-term current use of insulin (HCC)   Borderline prolonged QT interval   Chronic combined systolic and diastolic congestive heart failure (HCC)  Brief Summary: 55 y.o. female with medical history significant of chronic combined systolic and diastolic heart failure, morbid obesity, hypertension, ADD, history of bronchitis, depression, admitted on 08/24/2020 with acute stroke with right-sided hemiparesis and speech and swallowing concerns  A/p 1) Acute CVA--  MRI brain shows Acute lacunar type infarct involving the posterior limb of the left internal capsule. Additional small subacute lacunar type infarct involving the left periatrial white matter. -Echo with EF of 20%, with global hypokinesis -Agitated bubble study without evidence of interatrial shunt -MRA head without LVO, carotid artery Dopplers without hemodynamically significant stenosis -On aspirin, Plavix, Lipitor -PT and OT eval appreciated recommends acute inpatient rehab (CIR)--patient will need ongoing physical therapy, Occupational Therapy and speech therapy involvement LDL is 110, HDL 39  2)FEN--- speech and swallow eval appreciated,  recommended Dysphagia 3 (Mech soft); thin liquid  --Had swallow study- MBS on 08/26/2020 -No straws, tablets/pills should be taken in applesauce  3)DM2-  -A1c 9.8 reflecting uncontrolled DM with hyperglycemia PTA -Hold metformin -Add Lantus insulin 60 units nightly Use Novolog/Humalog Sliding scale insulin with - Accu-Cheks/Fingersticks as ordered   4)HTN--allow permissive hypertension   -- Hold losartan, hydralazine, Aldactone, IV labetalol as needed with parameters -Okay to titrate Coreg up -May add losartan and Entresto over the next day or 2  5)HFrEF--patient with chronic systolic dysfunction CHF with EF in the 20% range, hold losartan, Entresto, hydralazine and torsemide as well as Aldactone to avoid overtreating BP given acute stroke -Okay to titrate Coreg up to PTA dose of 12.5 mg bid -May add losartan and Entresto over the next day or 2  6)Depression/insomnia----hold Lexapro and trazodone due to prolonged QT -Hold Wellbutrin  7) hypothyroidism--continue levothyroxine 50 mcg daily, check TSH  Disposition/Need for in-Hospital Stay- patient unable to be discharged at this time due to -acute stroke requiring acute inpatient rehab pending bed availability at CIR  Status is: Inpatient  Remains inpatient appropriate because:Please see disposition above   Disposition: The patient is from: Home              Anticipated d/c is to: CIR              Anticipated d/c date is: 1 day              Patient currently is not medically stable to  d/c. Barriers: Not Clinically Stable-   Code Status :  -  Code Status: Full Code   Family Communication:     (patient is alert, awake and coherent)  Discussed with her mother [phone] Consults  :  Neurology  DVT Prophylaxis  :   - SCDs   enoxaparin (LOVENOX) injection 40 mg Start: 08/25/20 1000  Lab Results  Component Value Date   PLT 202 08/24/2020    Inpatient Medications  Scheduled Meds: . aspirin EC  325 mg Oral Once  .  aspirin EC  81 mg Oral Q breakfast  . atorvastatin  40 mg Oral Daily  . carvedilol  3.125 mg Oral BID WC  . Chlorhexidine Gluconate Cloth  6 each Topical Daily  . clopidogrel  75 mg Oral Once  . clopidogrel  75 mg Oral Daily  . enoxaparin (LOVENOX) injection  40 mg Subcutaneous Q24H  . insulin aspart  0-20 Units Subcutaneous TID WC  . levothyroxine  50 mcg Oral Q0600   Continuous Infusions: PRN Meds:.acetaminophen **OR** acetaminophen, labetalol, sodium chloride    Anti-infectives (From admission, onward)   None        Objective:   Vitals:   08/26/20 0300 08/26/20 0400 08/26/20 0500 08/26/20 0747  BP: (!) 143/100 (!) 145/96 (!) 177/113   Pulse: 70 71 79   Resp: Temp:  97.9 F (36.6 C)  (!) 97.4 F (36.3 C)  TempSrc:  Oral  Oral  SpO2: 100% 97% 100%   Weight:      Height:        Wt Readings from Last 3 Encounters:  08/25/20 115 kg  03/18/20 118.1 kg  03/13/20 (!) 142.6 kg    No intake or output data in the 24 hours ending 08/26/20 1031   Physical Exam  Gen:- Awake Alert,   in no acute distress  HEENT:- [place].AT, No sclera icterus Neck-Supple Neck,No JVD,.  Lungs-mostly clear, fair symmetrical air movement CV- S1, S2 normal, regular  Abd-  +ve B.Sounds, Abd Soft, No tenderness, increased truncal adiposity    Extremity/Skin:- No  edema, pedal pulses present = Psych-affect is appropriate, oriented x3 Neuro-right-sided hemiparesis, facial asymmetry with speech and swallowing concerns   Data Review:   Micro Results Recent Results (from the past 240 hour(s))  Resp Panel by RT-PCR (Flu A&B, Covid) Nasopharyngeal Swab     Status: None   Collection Time: 08/24/20  4:24 PM   Specimen: Nasopharyngeal Swab; Nasopharyngeal(NP) swabs in vial transport medium  Result Value Ref Range Status   SARS Coronavirus 2 by RT PCR NEGATIVE NEGATIVE Final    Comment: (NOTE) SARS-CoV-2 target nucleic acids are NOT DETECTED.  The SARS-CoV-2 RNA is generally detectable  in upper respiratory specimens during the acute phase of infection. The lowest concentration of SARS-CoV-2 viral copies this assay can detect is 138 copies/mL. A negative result does not preclude SARS-Cov-2 infection and should not be used as the sole basis for treatment or other patient management decisions. A negative result may occur with  improper specimen collection/handling, submission of specimen other than nasopharyngeal swab, presence of viral mutation(s) within the areas targeted by this assay, and inadequate number of viral copies(<138 copies/mL). A negative result must be combined with clinical observations, patient history, and epidemiological information. The expected result is Negative.  Fact Sheet for Patients:  BloggerCourse.com  Fact Sheet for Healthcare Providers:  SeriousBroker.it  This test is no t yet approved or cleared by the [place] and  has been authorized for detection and/or diagnosis of SARS-CoV-2 by FDA under an Emergency Use Authorization (EUA). This EUA will remain  in effect (meaning this test can be used) for the duration of the COVID-19 declaration under Section 564(b)(1) of the Act, 21 U.S.C.section 360bbb-3(b)(1), unless the authorization is terminated  or revoked sooner.       Influenza A by PCR NEGATIVE NEGATIVE Final   Influenza B by PCR NEGATIVE NEGATIVE Final    Comment: (NOTE) The Xpert Xpress SARS-CoV-2/FLU/RSV plus assay is intended as an aid in the diagnosis of influenza from Nasopharyngeal swab specimens and should not be used as a sole basis for treatment. Nasal washings and aspirates are unacceptable for Xpert Xpress SARS-CoV-2/FLU/RSV testing.  Fact Sheet for Patients: BloggerCourse.comhttps://www.fda.gov/media/152166/download  Fact Sheet for Healthcare Providers: SeriousBroker.ithttps://www.fda.gov/media/152162/download  This test is not yet approved or cleared by the [place] FDA and has  been authorized for detection and/or diagnosis of SARS-CoV-2 by FDA under an Emergency Use Authorization (EUA). This EUA will remain in effect (meaning this test can be used) for the duration of the COVID-19 declaration under Section 564(b)(1) of the Act, 21 U.S.C. section 360bbb-3(b)(1), unless the authorization is terminated or revoked.  Performed at [facility], [address] [place], [place] [id number]     Radiology Reports CT HEAD WO CONTRAST  Result Date: 08/24/2020 CLINICAL DATA:  Acute neural deficit. EXAM: CT HEAD WITHOUT CONTRAST TECHNIQUE: Contiguous axial images were obtained from the base of the skull through the vertex without intravenous contrast. COMPARISON:  March 11, 2020 FINDINGS: Brain: No evidence of acute hemorrhage, hydrocephalus, extra-axial collection or mass lesion/mass effect. Areas of hypoattenuation in the subcortical matter of the bilateral frontal lobes and right occipital lobe may represent asymmetric microvascular ischemic changes or other white matter disease. Vascular: Calcific atherosclerotic disease of the intra cavernous carotid arteries. Skull: Normal. Negative for fracture or focal lesion. Sinuses/Orbits: No acute finding. Other: None. IMPRESSION: 1. Areas of hypoattenuation in the subcortical matter of the bilateral frontal lobes and right occipital lobe may represent asymmetric microvascular ischemic changes or other white matter disease. 2. No evidence of acute intracranial hemorrhage. Electronically Signed   By: Ted Mcalpineobrinka  Dimitrova M.D.   On: 08/24/2020 17:10   CT CERVICAL SPINE WO CONTRAST  Result Date: 08/24/2020 CLINICAL DATA:  Status post fall. EXAM: CT CERVICAL SPINE WITHOUT CONTRAST TECHNIQUE: Multidetector CT imaging of the cervical spine was performed without intravenous contrast. Multiplanar CT image reconstructions were also generated. COMPARISON:  None. FINDINGS: Alignment: Normal. Skull base and vertebrae: No acute fracture. No primary  bone lesion or focal pathologic process. Soft tissues and spinal canal: No prevertebral fluid or swelling. No visible canal hematoma. Disc levels:  Normal. Upper chest: Negative. Other: None. IMPRESSION: No evidence of acute traumatic injury to the cervical spine. Electronically Signed   By: Ted Mcalpineobrinka  Dimitrova M.D.   On: 08/24/2020 17:13   MR ANGIO HEAD WO CONTRAST  Result Date: 08/24/2020 CLINICAL DATA:  Right-sided facial droop and right-sided weakness EXAM: MRI HEAD WITHOUT CONTRAST MRA HEAD WITHOUT CONTRAST TECHNIQUE: Multiplanar, multi-echo pulse sequences of the brain and surrounding structures were acquired without intravenous contrast. Angiographic images of the Circle of Willis were acquired using MRA technique without intravenous contrast. COMPARISON: No pertinent prior exam. COMPARISON:  None FINDINGS: MRI HEAD DWI and sagittal T1 sequences were obtained. Patient could not tolerate remainder. There is a subcentimeter infarct involving the posterior limb of the left internal capsule. Additional area of diffusion hyperintensity without restriction along the  left periatrial white matter. MRA HEAD Motion artifact is present. Intracranial internal carotid arteries and proximal anterior and middle cerebral arteries appear patent. Intracranial vertebral arteries, basilar artery, and proximal left posterior cerebral artery appear patent. Right P1 PCA is patent. There is poor flow related enhancement of the remainder of the proximal right PCA. IMPRESSION: Acute lacunar type infarct involving the posterior limb of the left internal capsule. Additional small subacute lacunar type infarct involving the left periatrial white matter. Motion degraded MRA of the head. Possible stenosis of the proximal right PCA. Electronically Signed   By: Guadlupe Spanish M.D.   On: 08/24/2020 19:09   MR BRAIN WO CONTRAST  Result Date: 08/24/2020 CLINICAL DATA:  Right-sided facial droop and right-sided weakness EXAM: MRI HEAD  WITHOUT CONTRAST MRA HEAD WITHOUT CONTRAST TECHNIQUE: Multiplanar, multi-echo pulse sequences of the brain and surrounding structures were acquired without intravenous contrast. Angiographic images of the Circle of Willis were acquired using MRA technique without intravenous contrast. COMPARISON: No pertinent prior exam. COMPARISON:  None FINDINGS: MRI HEAD DWI and sagittal T1 sequences were obtained. Patient could not tolerate remainder. There is a subcentimeter infarct involving the posterior limb of the left internal capsule. Additional area of diffusion hyperintensity without restriction along the left periatrial white matter. MRA HEAD Motion artifact is present. Intracranial internal carotid arteries and proximal anterior and middle cerebral arteries appear patent. Intracranial vertebral arteries, basilar artery, and proximal left posterior cerebral artery appear patent. Right P1 PCA is patent. There is poor flow related enhancement of the remainder of the proximal right PCA. IMPRESSION: Acute lacunar type infarct involving the posterior limb of the left internal capsule. Additional small subacute lacunar type infarct involving the left periatrial white matter. Motion degraded MRA of the head. Possible stenosis of the proximal right PCA. Electronically Signed   By: Guadlupe Spanish M.D.   On: 08/24/2020 19:09   US Carotid Bilateral (at [facility] and [facility] only)  Result Date: 08/25/2020 CLINICAL DATA:  Acute ischemic stroke EXAM: BILATERAL CAROTID DUPLEX ULTRASOUND TECHNIQUE: Wallace Cullens scale imaging, color Doppler and duplex ultrasound were performed of bilateral carotid and vertebral arteries in the neck. COMPARISON:  None. FINDINGS: Criteria: Quantification of carotid stenosis is based on velocity parameters that correlate the residual internal carotid diameter with NASCET-based stenosis levels, using the diameter of the distal internal carotid lumen as the denominator for stenosis measurement. The following velocity  measurements were obtained: RIGHT ICA: 48/10 cm/sec CCA: 52/8 cm/sec SYSTOLIC ICA/CCA RATIO:  0.9 ECA: 40 cm/sec LEFT ICA: 57/25 cm/sec CCA: 69/13 cm/sec SYSTOLIC ICA/CCA RATIO:  0.8 ECA: 41 cm/sec RIGHT CAROTID ARTERY: No significant atheromatous plaque. RIGHT VERTEBRAL ARTERY:  Antegrade flow. LEFT CAROTID ARTERY:  No significant atheromatous plaque. LEFT VERTEBRAL ARTERY:  Antegrade flow. IMPRESSION: No significant stenosis internal carotid arteries. Electronically Signed   By: Acquanetta Belling M.D.   On: 08/25/2020 10:54   ECHOCARDIOGRAM COMPLETE BUBBLE STUDY  Result Date: 08/25/2020    ECHOCARDIOGRAM REPORT   Patient Name:   BRILYN TULLER Date of Exam: 08/25/2020 Medical Rec #:  [id number]    Height:       64.0 in Accession #:    [id number]   Weight:       253.5 lb Date of Birth:  1965-09-12   BSA:          2.164 m Patient Age:    54 years     BP:           144/84 mmHg Patient Gender: F  HR:           88 bpm. [facility] Procedure: 2D Echo, Cardiac Doppler, Color Doppler and Saline Contrast Bubble            Study Indications:    Stroke 434.91/l63.9  History:        Patient has prior history of Echocardiogram examinations, most                 recent 03/09/2020. Risk Factors:Hypertension and Diabetes. Acute                 CVA (cerebrovascular accident) [facility]), Chronic combined systolic                 and diastolic congestive heart failure (HCC), Obesity.  Sonographer:    Celesta Gentile RCS Referring Phys: [id number] DAVID MANUEL ORTIZ IMPRESSIONS  1. Left ventricular ejection fraction, by estimation, is approximately 20%. The left ventricle has severely decreased function. The left ventricle demonstrates global hypokinesis. There is moderate left ventricular hypertrophy. Left ventricular diastolic parameters are indeterminate.  2. Right ventricular systolic function is severely reduced. The right ventricular size is normal. Tricuspid regurgitation signal is inadequate for assessing PA  pressure.  3. The mitral valve is abnormal, mildly calcified. Mild mitral valve regurgitation.  4. The aortic valve is tricuspid. Aortic valve regurgitation is not visualized.  5. The inferior vena cava is normal in size with <50% respiratory variability, suggesting right atrial pressure of 8 mmHg.  6. Agitated saline contrast bubble study was negative, with no evidence of any interatrial shunt. FINDINGS  Left Ventricle: Left ventricular ejection fraction, by estimation, is 20%. The left ventricle has severely decreased function. The left ventricle demonstrates global hypokinesis. The left ventricular internal cavity size was normal in size. There is moderate left ventricular hypertrophy. Left ventricular diastolic parameters are indeterminate. Right Ventricle: The right ventricular size is normal. No increase in right ventricular wall thickness. Right ventricular systolic function is severely reduced. Tricuspid regurgitation signal is inadequate for assessing PA pressure. Left Atrium: Left atrial size was normal in size. Right Atrium: Right atrial size was normal in size. Pericardium: There is no evidence of pericardial effusion. Presence of pericardial fat pad. Mitral Valve: The mitral valve is abnormal. There is mild calcification of the mitral valve leaflet(s). Mild mitral annular calcification. Mild mitral valve regurgitation. Tricuspid Valve: The tricuspid valve is grossly normal. Tricuspid valve regurgitation is mild. Aortic Valve: The aortic valve is tricuspid. There is mild aortic valve annular calcification. Aortic valve regurgitation is not visualized. Pulmonic Valve: The pulmonic valve was grossly normal. Pulmonic valve regurgitation is trivial. Aorta: The aortic root is normal in size and structure. Venous: The inferior vena cava is normal in size with less than 50% respiratory variability, suggesting right atrial pressure of 8 mmHg. IAS/Shunts: No atrial level shunt detected by color flow Doppler.  Agitated saline contrast was given intravenously to evaluate for intracardiac shunting. Agitated saline contrast bubble study was negative, with no evidence of any interatrial shunt.  LEFT VENTRICLE PLAX 2D LVIDd:         5.40 cm LVIDs:         4.70 cm LV PW:         1.70 cm LV IVS:        1.40 cm LVOT diam:     1.90 cm LV SV:         29 LV SV Index:   13 LVOT Area:  2.84 cm  RIGHT VENTRICLE RV S prime:     8.70 cm/s TAPSE (M-mode): 1.3 cm LEFT ATRIUM             Index       RIGHT ATRIUM           Index LA diam:        4.30 cm 1.99 cm/m  RA Area:     20.80 cm LA Vol (A2C):   80.1 ml 37.02 ml/m RA Volume:   59.50 ml  27.50 ml/m LA Vol (A4C):   61.3 ml 28.33 ml/m LA Biplane Vol: 74.4 ml 34.38 ml/m  AORTIC VALVE LVOT Vmax:   62.00 cm/s LVOT Vmean:  43.500 cm/s LVOT VTI:    0.102 m  AORTA Ao Root diam: 2.90 cm MITRAL VALVE MV Area (PHT): 4.57 cm     SHUNTS MV Decel Time: 166 msec     Systemic VTI:  0.10 m MV E velocity: 107.00 cm/s  Systemic Diam: 1.90 cm MV A velocity: 71.60 cm/s MV E/A ratio:  1.49 Nona Dell MD Electronically signed by Nona Dell MD Signature Date/Time: 08/25/2020/3:54:59 PM    Final      CBC Recent Labs  Lab 08/24/20 1624  WBC 7.2  HGB 14.1  HCT 42.2  PLT 202  MCV 94.2  MCH 31.5  MCHC 33.4  RDW 15.2  LYMPHSABS 1.5  MONOABS 0.3  EOSABS 0.2  BASOSABS 0.1    Chemistries  Recent Labs  Lab 08/24/20 1624 08/25/20 0349  NA 138  --   K 3.6  --   CL 104  --   CO2 24  --   GLUCOSE 207*  --   BUN 16  --   CREATININE 0.96  --   CALCIUM 9.1  --   MG 1.8  --   AST 17 17  ALT 22 20  ALKPHOS 79 72  BILITOT 1.8* 1.2   ------------------------------------------------------------------------------------------------------------------ Recent Labs    08/25/20 0349  CHOL 181  HDL 39*  LDLCALC 110*  TRIG 160*  CHOLHDL 4.6    Lab Results  Component Value Date   HGBA1C 9.8 (H) 08/25/2020    ------------------------------------------------------------------------------------------------------------------ No results for input(s): TSH, T4TOTAL, T3FREE, THYROIDAB in the last 72 hours.  Invalid input(s): FREET3 ------------------------------------------------------------------------------------------------------------------ No results for input(s): VITAMINB12, FOLATE, FERRITIN, TIBC, IRON, RETICCTPCT in the last 72 hours.  Coagulation profile Recent Labs  Lab 08/24/20 1624  INR 1.1    No results for input(s): DDIMER in the last 72 hours.  Cardiac Enzymes No results for input(s): CKMB, TROPONINI, MYOGLOBIN in the last 168 hours.  Invalid input(s): CK ------------------------------------------------------------------------------------------------------------------    Component Value Date/Time   BNP 699.0 (H) 03/08/2020 1738     Shon Hale M.D on 08/26/2020 at 10:31 AM  Go to [email or web address] - for contact info  [facility] - Office  [phone]

## 2020-08-26 NOTE — Evaluation (Signed)
Modified Barium Swallow Progress Note  Patient Details  Name: Connie Shepard MRN: [id number] Date of Birth: 05-14-1965  Today's Date: 08/26/2020  Modified Barium Swallow completed.  Full report located under Chart Review in the Imaging Section.  Brief recommendations include the following:  Clinical Impression  Pt presents with mild pharyngeal dysphagia characterized by occasional flash penetration of thin liquids during the swallow; note one aspiration episode with pill administration that occured prior to the swallow which was likely related to coordination of pill/liquid. Further note deep flash penetration of thin liquids with a straw. With single sips and cues to swallow "hard and fast", note significant improvement manifested by no penetration of thin liquids with this strategy. Pt exhibits adequate hyolaryngeal excursion and good laryngeal vestibule closure when cued to swallow hard and fast; further note without "hard/fast swallow" cue note decreased laryngeal vestibule closure resulting in occasional episodes of penetration. Note flash penetration with NTL trials also, again note no penetration of NTL with single sip and cues to swallow "hard/fast". All penetration/aspiration episodes were sensed and followed by a strong reflexive cough. Recommend initiate D3/mech soft diet with upgrade to thin liquids -- NO STRAWS - one sip at a time with reminders to swallow "hard and fast"; meds should be administered whole with applesauce. Pt will benefit from continued ST to reinforce compensatory strategies for dysphagia.   Swallow Evaluation Recommendations       SLP Diet Recommendations: Dysphagia 3 (Mech soft) solids;Thin liquid   Liquid Administration via: No straw;Cup   Medication Administration: Whole meds with puree   Supervision: Patient able to self feed   Compensations: Slow rate;Small sips/bites   Postural Changes: Seated upright at 90 degrees   Oral Care Recommendations: Oral  care BID   Other Recommendations: Clarify dietary restrictions   Tameca Jerez H. Romie Levee, CCC-SLP Speech Language Pathologist  [name] 08/26/2020,10:44 AM

## 2020-08-26 NOTE — Progress Notes (Signed)
Inpatient Rehab Admissions Coordinator:   Spoke to patient on the phone to discuss recommendations of CIR.  She adamantly refused stating she was not interested in going anywhere else.  She would be an excellent CIR candidate, and I attempted to explain benefits of CIR's high intensity therapy program, but she is not interested.  I will let TOC know to f/u for further d/c planning.  CIR will sign off at this time.    Estill Dooms, PT, DPT Admissions Coordinator [phone] 08/26/20  12:54 PM

## 2020-08-27 DIAGNOSIS — I639 Cerebral infarction, unspecified: Secondary | ICD-10-CM | POA: Diagnosis not present

## 2020-08-27 LAB — CBC
HCT: 39.8 % (ref 36.0–46.0)
Hemoglobin: 12.7 g/dL (ref 12.0–15.0)
MCH: 31.4 pg (ref 26.0–34.0)
MCHC: 31.9 g/dL (ref 30.0–36.0)
MCV: 98.5 fL (ref 80.0–100.0)
Platelets: 192 10*3/uL (ref 150–400)
RBC: 4.04 MIL/uL (ref 3.87–5.11)
RDW: 16.4 % — ABNORMAL HIGH (ref 11.5–15.5)
WBC: 6.6 10*3/uL (ref 4.0–10.5)
nRBC: 1.2 % — ABNORMAL HIGH (ref 0.0–0.2)

## 2020-08-27 LAB — GLUCOSE, CAPILLARY
Glucose-Capillary: 162 mg/dL — ABNORMAL HIGH (ref 70–99)
Glucose-Capillary: 132 mg/dL — ABNORMAL HIGH (ref 70–99)
Glucose-Capillary: 146 mg/dL — ABNORMAL HIGH (ref 70–99)
Glucose-Capillary: 191 mg/dL — ABNORMAL HIGH (ref 70–99)

## 2020-08-27 LAB — URINALYSIS, ROUTINE W REFLEX MICROSCOPIC
Bacteria, UA: NONE SEEN
Bilirubin Urine: NEGATIVE
Glucose, UA: >=500 mg/dL — AB
Hgb urine dipstick: NEGATIVE
Ketones, ur: NEGATIVE mg/dL
Leukocytes,Ua: NEGATIVE
Nitrite: NEGATIVE
Protein, ur: 100 mg/dL — AB
Specific Gravity, Urine: 1.029 (ref 1.005–1.030)
WBC, UA: >50 WBC/hpf — ABNORMAL HIGH (ref 0–5)
pH: 5 (ref 5.0–8.0)

## 2020-08-27 LAB — TSH: TSH: 5.372 u[IU]/mL — ABNORMAL HIGH (ref 0.350–4.500)

## 2020-08-27 LAB — BASIC METABOLIC PANEL
Anion gap: 10 (ref 5–15)
BUN: 16 mg/dL (ref 6–20)
CO2: 26 mmol/L (ref 22–32)
Calcium: 9.1 mg/dL (ref 8.9–10.3)
Chloride: 103 mmol/L (ref 98–111)
Creatinine, Ser: 1.01 mg/dL — ABNORMAL HIGH (ref 0.44–1.00)
GFR, Estimated: >60 mL/min (ref >60)
Glucose, Bld: 148 mg/dL — ABNORMAL HIGH (ref 70–99)
Potassium: 3.8 mmol/L (ref 3.5–5.1)
Sodium: 139 mmol/L (ref 135–145)

## 2020-08-27 LAB — MAGNESIUM: Magnesium: 1.8 mg/dL (ref 1.7–2.4)

## 2020-08-27 MED ORDER — SODIUM CHLORIDE 0.9 % IV SOLN: INTRAVENOUS | Status: DC

## 2020-08-27 MED ORDER — SODIUM CHLORIDE 0.9 % IV SOLN
1.0000 g | INTRAVENOUS | Status: DC
  Administered 2020-08-27 – 2020-08-28 (×2): 1 g via INTRAVENOUS
  Filled 2020-08-27 (×2): qty 10

## 2020-08-27 MED ORDER — HYDROMORPHONE HCL 1 MG/ML IJ SOLN
1.0000 mg | Freq: Once | INTRAMUSCULAR | Status: AC
  Administered 2020-08-27: 1 mg via INTRAVENOUS
  Filled 2020-08-27: qty 1

## 2020-08-27 MED ORDER — LORAZEPAM 2 MG/ML IJ SOLN
0.5000 mg | Freq: Four times a day (QID) | INTRAMUSCULAR | Status: DC | PRN
  Administered 2020-08-28 – 2020-08-31 (×8): 0.5 mg via INTRAVENOUS
  Filled 2020-08-27 (×8): qty 1

## 2020-08-27 MED ORDER — ONDANSETRON HCL 4 MG/2ML IJ SOLN
4.0000 mg | Freq: Once | INTRAMUSCULAR | Status: AC
  Administered 2020-08-27: 4 mg via INTRAVENOUS
  Filled 2020-08-27: qty 2

## 2020-08-27 MED ORDER — LEVOTHYROXINE SODIUM 100 MCG PO TABS
100.0000 ug | ORAL_TABLET | Freq: Every day | ORAL | Status: DC
  Administered 2020-08-28 – 2020-08-31 (×4): 100 ug via ORAL
  Filled 2020-08-27 (×3): qty 1

## 2020-08-27 NOTE — Progress Notes (Signed)
Inpatient Rehab Admissions:  Social worker, Bria contacted [facility] to inform her that pt is now agreeable to CIR.  Attempted to contact pt via phone.  Pt's mother, Clide Cliff answered phone and informed AC that pt was asleep.  Discussed CIR goals and expectations of an inpatient rehab admission with Laverne.  She acknowledged understanding of CIR goals and expectations. She is interested in pt pursuing CIR.  She indicated that she and her husband could provide assistance to pt after hospital discharge.  Will attempt to contact patient tomorrow to discuss CIR.  Signed: Wolfgang Phoenix, MS, CCC-SLP Admissions Coordinator [phone]

## 2020-08-27 NOTE — Progress Notes (Signed)
Held AM meds because patient is not alert and orient enough to swallow pills. Will reevaluate later.

## 2020-08-27 NOTE — TOC Progression Note (Addendum)
Transition of Care [facility]) - Progression Note    Patient Details  Name: Shataya Winkles MRN: [id number] Date of Birth: 10-26-1965  Transition of [facility]) CM/SW Contact  Barry Brunner, LCSW Phone Number: 08/27/2020, 1:44 PM  Clinical Narrative:    CSW contacted [employer] [facility] [employer] for [facility]. Both reported that if patient was approved, Patient would have a copay of $125-150 per service per visit. CSW contacted patient to discuss discharge planning. CSW inquired about patient's agreeableness to inpatient rehab. Patient stated, "that would be good" and replied "yes" when asked if she was agreeable. CSW notified MD, nurse and Lauren with CIR. CSW contacted patient's daughter to update her on patient's decision. Patient's daughter reported that patient would not be able to return home with patient's parents. Patient's daughter reported that patient's family is in full support of patient attending CIR. [name] are unable to care for patient. Patient's daughter reported concern for patient's psychiatric health and patient's ability to make decisions for her self. CSW provided information for obtaining HCPOA and or contacting [facility]PS. Patient's daughter reported that she will follow up on information while patient is in CIR. CIR to review. TOC to follow.        Expected Discharge Plan and Services                                                 Social Determinants of Health (SDOH) Interventions    Readmission Risk Interventions No flowsheet data found.

## 2020-08-27 NOTE — Progress Notes (Signed)
Patient had 7 beat run of Vtach, Patient now sinus rhythm 75 on telemetry. BP still elevated. Patient now alert, given PO meds.

## 2020-08-27 NOTE — Progress Notes (Signed)
Patient Demographics:    Connie CapronDenise Shepard, is a 55 y.o. female, DOB - 12-06-1965, GNF:[id number]  Admit date - 08/24/2020   Admitting Physician Courage Mariea ClontsEmokpae, MD  Outpatient Primary MD for the patient is Toma DeitersHasanaj, Xaje A, MD  LOS - 3   Chief Complaint  Patient presents with  . Altered Mental Status        Subjective:    Connie Shepard, sleepy, arousable Hypertensive this Shepard, afebrile, WBC 6.6, UA positive for > 50 WBC, negative nitrites Leukocyte Estrace  -Had swallow study- MBS on 08/26/2020    Assessment  & Plan :    Principal Problem:   Acute CVA (cerebrovascular accident) [facility](HCC) Active Problems:   Essential hypertension   Depression   Hypothyroidism   Type 2 diabetes mellitus with complication, without long-term current use of insulin (HCC)   Borderline prolonged QT interval   Chronic combined systolic and diastolic congestive heart failure (HCC)  Brief Summary: 55 y.o. female with medical history significant of chronic combined systolic and diastolic heart failure, morbid obesity, hypertension, ADD, history of bronchitis, depression, admitted on 08/24/2020 with acute stroke with right-sided hemiparesis and speech and swallowing concerns   --------------------------------------------------------------------------------------------------------------------------- Assessment and plan 1) Acute CVA--  MRI brain shows Acute lacunar type infarct involving the posterior limb of the left internal capsule. Additional small subacute lacunar type infarct involving the left periatrial white matter. -Echo with EF of 20%, with global hypokinesis -Agitated bubble study without evidence of interatrial shunt -MRA head without LVO, carotid artery Dopplers without hemodynamically significant stenosis -On aspirin, Plavix, Lipitor -PT and OT eval appreciated  recommends acute inpatient rehab ([facility])--patient will need ongoing physical therapy, Occupational Therapy and speech therapy involvement LDL is 110, HDL 39 -Mildly lethargic this a.m. otherwise stable  2)FEN--- speech and swallow eval appreciated, recommended Dysphagia 3 (Mech soft); thin liquid  --Had swallow study- MBS on 08/26/2020 -No straws, tablets/pills should be taken in applesauce -P.o. had been held as patient was sleepy, lethargic this Shepard  3)DM2-  -A1c 9.8 reflecting uncontrolled DM with hyperglycemia PTA -Hold metformin -Add Lantus insulin 60 units nightly Use Novolog/Humalog Sliding scale insulin with - Accu-Cheks/Fingersticks as ordered   4)HTN--allow permissive hypertension   -- Hold losartan, hydralazine, Aldactone, IV labetalol as needed with parameters -Okay to titrate Coreg up -May add losartan and Entresto over the next day or 2  5)HFrEF--patient with chronic systolic dysfunction CHF with EF in the 20% range, hold losartan, Entresto, hydralazine and torsemide as well as Aldactone to avoid overtreating BP given acute stroke -Okay to titrate Coreg up to PTA dose of 12.5 mg bid -May add losartan and Entresto over the next day or 2  6)Depression/insomnia----hold Lexapro and trazodone due to prolonged QT -Hold Wellbutrin  7) Hypothyroidism--continue levothyroxine 50 mcg daily, TSH elevated to 5.372, checking free T3-4  8) Encephalopathy  -Mildly lethargic this Shepard, UA was checked overnight, greater than 500 glucose, negative for nitrites or leukocyte esterase no bacteria, WBC > 50, cultures pending -Empiric antibiotics Rocephin initiated  --anticipating discontinue within next 2 to 3 days    Disposition/Need for in-Hospital Stay- patient unable to be discharged at this time due to  -acute stroke requiring acute inpatient rehab pending bed  availability at CIR  Status is: Inpatient  Remains inpatient appropriate because:Please see disposition  above   Disposition: The patient is from: Home              Anticipated d/c is to: [facility]              Anticipated d/c date is: 1 day              Patient currently is not medically stable to d/c. Barriers: Not Clinically Stable-   Code Status :  -  Code Status: Full Code   Family Communication:     (patient is alert, awake and coherent)  Discussed with her mother [phone] Consults  :  Neurology  DVT Prophylaxis  :   - SCDs   enoxaparin (LOVENOX) injection 40 mg Start: 08/25/20 1000  Lab Results  Component Value Date   PLT 192 08/27/2020    Inpatient Medications  Scheduled Meds: . aspirin EC  325 mg Oral Once  . aspirin EC  81 mg Oral Q breakfast  . atorvastatin  40 mg Oral Daily  . carvedilol  12.5 mg Oral BID WC  . Chlorhexidine Gluconate Cloth  6 each Topical Daily  . clopidogrel  75 mg Oral Once  . clopidogrel  75 mg Oral Daily  . enoxaparin (LOVENOX) injection  40 mg Subcutaneous Q24H  . insulin aspart  0-20 Units Subcutaneous TID WC  . insulin glargine  6 Units Subcutaneous QHS  . levothyroxine  50 mcg Oral Q0600   Continuous Infusions: . sodium chloride    . cefTRIAXone (ROCEPHIN)  IV     PRN Meds:.acetaminophen **OR** acetaminophen, labetalol, LORazepam, sodium chloride    Anti-infectives (From admission, onward)   Start     Dose/Rate Route Frequency Ordered Stop   08/27/20 1345  cefTRIAXone (ROCEPHIN) 1 g in sodium chloride 0.9 % 100 mL IVPB        1 g 200 mL/hr over 30 Minutes Intravenous Every 24 hours 08/27/20 1245          Objective:   Vitals:   08/26/20 2137 08/27/20 0230 08/27/20 0301 08/27/20 0603  BP: (!) 182/146 (!) 197/138 (!) 171/104 (!) 172/97  Pulse: 84 89 72 80  Resp: 18 18  (!) 22  Temp: 98 F (36.7 C) 98.1 F (36.7 C)  (!) 97.4 F (36.3 C)  TempSrc: Oral Oral  Oral  SpO2: 99% 100%  94%  Weight:      Height:        Wt Readings from Last 3 Encounters:  08/25/20 115 kg  03/18/20 118.1 kg  03/13/20 (!) 142.6 kg      Intake/Output Summary (Last 24 hours) at 08/27/2020 1245 Last data filed at 08/27/2020 1040 Gross per 24 hour  Intake 240 ml  Output 600 ml  Net -360 ml     Physical Exam:   General:   Mild-moderate-lethargic, no distress;   HEENT:  Normocephalic, PERRL, otherwise with in Normal limits   Neuro:   Right-sided facial droop, facial asymmetry, dysarthria,  CNII-XII intact. , normal motor and sensation, reflexes intact   Lungs:   Clear to auscultation BL, Respirations unlabored, no wheezes / crackles  Cardio:    S1/S2, RRR, No murmure, No Rubs or Gallops   Abdomen:   Soft, non-tender, bowel sounds active all four quadrants,  no guarding or peritoneal signs.  Muscular skeletal:  Limited exam - in bed, able to move all 4 extremities, Normal strength,  2+ pulses,  symmetric, No pitting edema  Skin:  Dry, warm to touch, negative for any Rashes,  Wounds: Please see nursing documentation        Data Review:   Micro Results Recent Results (from the past 240 hour(s))  Resp Panel by RT-PCR (Flu A&B, Covid) Nasopharyngeal Swab     Status: None   Collection Time: 08/24/20  4:24 PM   Specimen: Nasopharyngeal Swab; Nasopharyngeal(NP) swabs in vial transport medium  Result Value Ref Range Status   SARS Coronavirus 2 by RT PCR NEGATIVE NEGATIVE Final    Comment: (NOTE) SARS-CoV-2 target nucleic acids are NOT DETECTED.  The SARS-CoV-2 RNA is generally detectable in upper respiratory specimens during the acute phase of infection. The lowest concentration of SARS-CoV-2 viral copies this assay can detect is 138 copies/mL. A negative result does not preclude SARS-Cov-2 infection and should not be used as the sole basis for treatment or other patient management decisions. A negative result may occur with  improper specimen collection/handling, submission of specimen other than nasopharyngeal swab, presence of viral mutation(s) within the areas targeted by this assay, and inadequate number  of viral copies(<138 copies/mL). A negative result must be combined with clinical observations, patient history, and epidemiological information. The expected result is Negative.  Fact Sheet for Patients:  BloggerCourse.com  Fact Sheet for Healthcare Providers:  SeriousBroker.it  This test is no t yet approved or cleared by the [place] FDA and  has been authorized for detection and/or diagnosis of SARS-CoV-2 by FDA under an Emergency Use Authorization (EUA). This EUA will remain  in effect (meaning this test can be used) for the duration of the COVID-19 declaration under Section 564(b)(1) of the Act, 21 U.S.C.section 360bbb-3(b)(1), unless the authorization is terminated  or revoked sooner.       Influenza A by PCR NEGATIVE NEGATIVE Final   Influenza B by PCR NEGATIVE NEGATIVE Final    Comment: (NOTE) The Xpert Xpress SARS-CoV-2/FLU/RSV plus assay is intended as an aid in the diagnosis of influenza from Nasopharyngeal swab specimens and should not be used as a sole basis for treatment. Nasal washings and aspirates are unacceptable for Xpert Xpress SARS-CoV-2/FLU/RSV testing.  Fact Sheet for Patients: BloggerCourse.com  Fact Sheet for Healthcare Providers: SeriousBroker.it  This test is not yet approved or cleared by the [place] FDA and has been authorized for detection and/or diagnosis of SARS-CoV-2 by FDA under an Emergency Use Authorization (EUA). This EUA will remain in effect (meaning this test can be used) for the duration of the COVID-19 declaration under Section 564(b)(1) of the Act, 21 U.S.C. section 360bbb-3(b)(1), unless the authorization is terminated or revoked.  Performed at [facility], [address] [place], [place] [address]     Radiology Reports CT HEAD WO CONTRAST  Result Date: 08/24/2020 CLINICAL DATA:  Acute neural deficit. EXAM: CT  HEAD WITHOUT CONTRAST TECHNIQUE: Contiguous axial images were obtained from the base of the skull through the vertex without intravenous contrast. COMPARISON:  March 11, 2020 FINDINGS: Brain: No evidence of acute hemorrhage, hydrocephalus, extra-axial collection or mass lesion/mass effect. Areas of hypoattenuation in the subcortical matter of the bilateral frontal lobes and right occipital lobe may represent asymmetric microvascular ischemic changes or other white matter disease. Vascular: Calcific atherosclerotic disease of the intra cavernous carotid arteries. Skull: Normal. Negative for fracture or focal lesion. Sinuses/Orbits: No acute finding. Other: None. IMPRESSION: 1. Areas of hypoattenuation in the subcortical matter of the bilateral frontal lobes and right occipital lobe may represent asymmetric microvascular  ischemic changes or other white matter disease. 2. No evidence of acute intracranial hemorrhage. Electronically Signed   By: Ted Mcalpine M.D.   On: 08/24/2020 17:10   CT CERVICAL SPINE WO CONTRAST  Result Date: 08/24/2020 CLINICAL DATA:  Status post fall. EXAM: CT CERVICAL SPINE WITHOUT CONTRAST TECHNIQUE: Multidetector CT imaging of the cervical spine was performed without intravenous contrast. Multiplanar CT image reconstructions were also generated. COMPARISON:  None. FINDINGS: Alignment: Normal. Skull base and vertebrae: No acute fracture. No primary bone lesion or focal pathologic process. Soft tissues and spinal canal: No prevertebral fluid or swelling. No visible canal hematoma. Disc levels:  Normal. Upper chest: Negative. Other: None. IMPRESSION: No evidence of acute traumatic injury to the cervical spine. Electronically Signed   By: Ted Mcalpine M.D.   On: 08/24/2020 17:13   MR ANGIO HEAD WO CONTRAST  Result Date: 08/24/2020 CLINICAL DATA:  Right-sided facial droop and right-sided weakness EXAM: MRI HEAD WITHOUT CONTRAST MRA HEAD WITHOUT CONTRAST TECHNIQUE:  Multiplanar, multi-echo pulse sequences of the brain and surrounding structures were acquired without intravenous contrast. Angiographic images of the Circle of Willis were acquired using MRA technique without intravenous contrast. COMPARISON: No pertinent prior exam. COMPARISON:  None FINDINGS: MRI HEAD DWI and sagittal T1 sequences were obtained. Patient could not tolerate remainder. There is a subcentimeter infarct involving the posterior limb of the left internal capsule. Additional area of diffusion hyperintensity without restriction along the left periatrial white matter. MRA HEAD Motion artifact is present. Intracranial internal carotid arteries and proximal anterior and middle cerebral arteries appear patent. Intracranial vertebral arteries, basilar artery, and proximal left posterior cerebral artery appear patent. Right P1 PCA is patent. There is poor flow related enhancement of the remainder of the proximal right PCA. IMPRESSION: Acute lacunar type infarct involving the posterior limb of the left internal capsule. Additional small subacute lacunar type infarct involving the left periatrial white matter. Motion degraded MRA of the head. Possible stenosis of the proximal right PCA. Electronically Signed   By: Guadlupe Spanish M.D.   On: 08/24/2020 19:09   MR BRAIN WO CONTRAST  Result Date: 08/24/2020 CLINICAL DATA:  Right-sided facial droop and right-sided weakness EXAM: MRI HEAD WITHOUT CONTRAST MRA HEAD WITHOUT CONTRAST TECHNIQUE: Multiplanar, multi-echo pulse sequences of the brain and surrounding structures were acquired without intravenous contrast. Angiographic images of the Circle of Willis were acquired using MRA technique without intravenous contrast. COMPARISON: No pertinent prior exam. COMPARISON:  None FINDINGS: MRI HEAD DWI and sagittal T1 sequences were obtained. Patient could not tolerate remainder. There is a subcentimeter infarct involving the posterior limb of the left internal capsule.  Additional area of diffusion hyperintensity without restriction along the left periatrial white matter. MRA HEAD Motion artifact is present. Intracranial internal carotid arteries and proximal anterior and middle cerebral arteries appear patent. Intracranial vertebral arteries, basilar artery, and proximal left posterior cerebral artery appear patent. Right P1 PCA is patent. There is poor flow related enhancement of the remainder of the proximal right PCA. IMPRESSION: Acute lacunar type infarct involving the posterior limb of the left internal capsule. Additional small subacute lacunar type infarct involving the left periatrial white matter. Motion degraded MRA of the head. Possible stenosis of the proximal right PCA. Electronically Signed   By: Guadlupe Spanish M.D.   On: 08/24/2020 19:09   US Carotid Bilateral (at [facility] and [facility] only)  Result Date: 08/25/2020 CLINICAL DATA:  Acute ischemic stroke EXAM: BILATERAL CAROTID DUPLEX ULTRASOUND TECHNIQUE: Wallace Cullens scale imaging, color Doppler  and duplex ultrasound were performed of bilateral carotid and vertebral arteries in the neck. COMPARISON:  None. FINDINGS: Criteria: Quantification of carotid stenosis is based on velocity parameters that correlate the residual internal carotid diameter with NASCET-based stenosis levels, using the diameter of the distal internal carotid lumen as the denominator for stenosis measurement. The following velocity measurements were obtained: RIGHT ICA: 48/10 cm/sec CCA: 52/8 cm/sec SYSTOLIC ICA/CCA RATIO:  0.9 ECA: 40 cm/sec LEFT ICA: 57/25 cm/sec CCA: 69/13 cm/sec SYSTOLIC ICA/CCA RATIO:  0.8 ECA: 41 cm/sec RIGHT CAROTID ARTERY: No significant atheromatous plaque. RIGHT VERTEBRAL ARTERY:  Antegrade flow. LEFT CAROTID ARTERY:  No significant atheromatous plaque. LEFT VERTEBRAL ARTERY:  Antegrade flow. IMPRESSION: No significant stenosis internal carotid arteries. Electronically Signed   By: Acquanetta Belling M.D.   On: 08/25/2020 10:54   DG  Swallowing Func-Speech Pathology  Result Date: 08/26/2020 Objective Swallowing Evaluation: Type of Study: MBS-Modified Barium Swallow Study  Patient Details Name: Alexandrya Chim MRN: [id number] Date of Birth: 12-01-1965 Today's Date: 08/26/2020 Time: SLP Start Time (ACUTE ONLY): 5916 -SLP Stop Time (ACUTE ONLY): 1025 SLP Time Calculation (min) (ACUTE ONLY): 43 min Past Medical History: Past Medical History: Diagnosis Date . Acute heart failure (HCC)  . ADD (attention deficit disorder)  . Bronchitis  . Depression  . Diabetes mellitus without complication (HCC)  . Elevated troponin  . Hypertension  . Obesity  Past Surgical History: Past Surgical History: Procedure Laterality Date . CESAREAN SECTION   HPI: Ravleen Ries is a 55 y.o. female with medical history significant of chronic combined systolic and diastolic heart failure, elevated troponin, hypertension, ADD, history of bronchitis, depression, class III obesity who was brought to the emergency department due to right-sided weakness after having a fall at home.  She was last seen well the previous date at 1700.  Her family became concerned and called EMS.  She initially refused to come to the ED.  She has motor aphasia and is unable to provide further information at this time.SLE requested. 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 Additional small subacute lacunar type infarct  involving the left periatrial white matter.  Assessment / Plan / Recommendation CHL IP CLINICAL IMPRESSIONS 08/26/2020 Clinical Impression Pt presents with mild pharyngeal dysphagia characterized by occasional flash penetration of thin liquids during the swallow; note one aspiration episode with pill administration that occured prior to the swallow which was likely related to coordination of pill/liquid. Further note deep flash penetration of thin liquids with a straw. With single sips and cues to swallow "hard and fast", note significant  improvement manifested by no penetration of thin liquids with this strategy. Pt exhibits adequate hyolaryngeal excursion and good laryngeal vestibule closure with a hard and fast swallow strategy; further note without "hard/fast swallow" note decreased laryngeal vestibule closure resulting in occasional episodes of penetration. Note flash penetration with NTL trials also, again note no penetration of NTL with single sip and cues to swallow "hard/fast". All penetration/aspiration episodes were sensed and followed by a strong reflexive cough. Recommend initiate D3/mech soft diet with upgrade to thin liquids -- NO STRAWS - one sip at a time with reminders to swallow "hard and fast"; meds should be administered whole with applesauce. Pt will benefit from continued ST to reinforce compensatory strategies for dysphagia.  SLP Visit Diagnosis Dysarthria and anarthria (R47.1);Cognitive communication deficit (R41.841) Attention and concentration deficit following -- Frontal lobe and executive function deficit following -- Impact on safety and function Mild  aspiration risk;Moderate aspiration risk   CHL IP TREATMENT RECOMMENDATION 08/26/2020 Treatment Recommendations Therapy as outlined in treatment plan below   Prognosis 08/26/2020 Prognosis for Safe Diet Advancement Good Barriers to Reach Goals -- Barriers/Prognosis Comment -- CHL IP DIET RECOMMENDATION 08/26/2020 SLP Diet Recommendations Dysphagia 3 (Mech soft) solids;Thin liquid Liquid Administration via No straw;Cup Medication Administration Whole meds with puree Compensations Slow rate;Small sips/bites Postural Changes Seated upright at 90 degrees   CHL IP OTHER RECOMMENDATIONS 08/26/2020 Recommended Consults -- Oral Care Recommendations Oral care BID Other Recommendations Clarify dietary restrictions   CHL IP FOLLOW UP RECOMMENDATIONS 08/26/2020 Follow up Recommendations Inpatient Rehab   CHL IP FREQUENCY AND DURATION 08/26/2020 Speech Therapy Frequency (ACUTE ONLY) min  2x/week Treatment Duration 1 week      CHL IP ORAL PHASE 08/26/2020 Oral Phase Impaired Oral - Pudding Teaspoon -- Oral - Pudding Cup -- Oral - Honey Teaspoon -- Oral - Honey Cup -- Oral - Nectar Teaspoon -- Oral - Nectar Cup -- Oral - Nectar Straw -- Oral - Thin Teaspoon -- Oral - Thin Cup -- Oral - Thin Straw -- Oral - Puree -- Oral - Mech Soft -- Oral - Regular -- Oral - Multi-Consistency -- Oral - Pill -- Oral Phase - Comment --  CHL IP PHARYNGEAL PHASE 08/26/2020 Pharyngeal Phase Impaired Pharyngeal- Pudding Teaspoon -- Pharyngeal -- Pharyngeal- Pudding Cup -- Pharyngeal -- Pharyngeal- Honey Teaspoon -- Pharyngeal -- Pharyngeal- Honey Cup -- Pharyngeal -- Pharyngeal- Nectar Teaspoon NT Pharyngeal -- Pharyngeal- Nectar Cup Penetration/Aspiration during swallow;Pharyngeal residue - valleculae;Pharyngeal residue - pyriform Pharyngeal Material enters airway, remains ABOVE vocal cords then ejected out Pharyngeal- Nectar Straw NT Pharyngeal -- Pharyngeal- Thin Teaspoon Penetration/Aspiration during swallow;Pharyngeal residue - valleculae;Pharyngeal residue - pyriform Pharyngeal Material enters airway, remains ABOVE vocal cords then ejected out Pharyngeal- Thin Cup Penetration/Aspiration before swallow;Penetration/Aspiration during swallow;Moderate aspiration Pharyngeal Material enters airway, passes BELOW cords and not ejected out despite cough attempt by patient;Material enters airway, passes BELOW cords then ejected out Pharyngeal- Thin Straw Penetration/Aspiration during swallow;Pharyngeal residue - valleculae;Pharyngeal residue - pyriform Pharyngeal Material enters airway, passes BELOW cords then ejected out Pharyngeal- Puree WFL Pharyngeal -- Pharyngeal- Mechanical Soft NT Pharyngeal -- Pharyngeal- Regular WFL Pharyngeal -- Pharyngeal- Multi-consistency NT Pharyngeal -- Pharyngeal- Pill NT Pharyngeal -- Pharyngeal Comment --  CHL IP CERVICAL ESOPHAGEAL PHASE 08/26/2020 Cervical Esophageal Phase WFL Pudding Teaspoon  -- Pudding Cup -- Honey Teaspoon -- Honey Cup -- Nectar Teaspoon -- Nectar Cup -- Nectar Straw -- Thin Teaspoon -- Thin Cup -- Thin Straw -- Puree -- Mechanical Soft -- Regular -- Multi-consistency -- Pill -- Cervical Esophageal Comment -- Amelia H. Romie Levee, CCC-SLP Speech Language Pathologist [name] 08/26/2020, 10:52 AM              ECHOCARDIOGRAM COMPLETE BUBBLE STUDY  Result Date: 08/25/2020    ECHOCARDIOGRAM REPORT   Patient Name:   KESLYN TEATER Date of Exam: 08/25/2020 Medical Rec #:  [id number]    Height:       64.0 in Accession #:    [id number]   Weight:       253.5 lb Date of Birth:  12-Feb-1966   BSA:          2.164 m Patient Age:    54 years     BP:           144/84 mmHg Patient Gender: F            HR:  88 bpm. [facility] Procedure: 2D Echo, Cardiac Doppler, Color Doppler and Saline Contrast Bubble            Study Indications:    Stroke 434.91/l63.9  History:        Patient has prior history of Echocardiogram examinations, most                 recent 03/09/2020. Risk Factors:Hypertension and Diabetes. Acute                 CVA (cerebrovascular accident) [facility]), Chronic combined systolic                 and diastolic congestive heart failure (HCC), Obesity.  Sonographer:    Celesta Gentile RCS Referring Phys: [id number] DAVID MANUEL ORTIZ IMPRESSIONS  1. Left ventricular ejection fraction, by estimation, is approximately 20%. The left ventricle has severely decreased function. The left ventricle demonstrates global hypokinesis. There is moderate left ventricular hypertrophy. Left ventricular diastolic parameters are indeterminate.  2. Right ventricular systolic function is severely reduced. The right ventricular size is normal. Tricuspid regurgitation signal is inadequate for assessing PA pressure.  3. The mitral valve is abnormal, mildly calcified. Mild mitral valve regurgitation.  4. The aortic valve is tricuspid. Aortic valve regurgitation is not visualized.  5. The  inferior vena cava is normal in size with <50% respiratory variability, suggesting right atrial pressure of 8 mmHg.  6. Agitated saline contrast bubble study was negative, with no evidence of any interatrial shunt. FINDINGS  Left Ventricle: Left ventricular ejection fraction, by estimation, is 20%. The left ventricle has severely decreased function. The left ventricle demonstrates global hypokinesis. The left ventricular internal cavity size was normal in size. There is moderate left ventricular hypertrophy. Left ventricular diastolic parameters are indeterminate. Right Ventricle: The right ventricular size is normal. No increase in right ventricular wall thickness. Right ventricular systolic function is severely reduced. Tricuspid regurgitation signal is inadequate for assessing PA pressure. Left Atrium: Left atrial size was normal in size. Right Atrium: Right atrial size was normal in size. Pericardium: There is no evidence of pericardial effusion. Presence of pericardial fat pad. Mitral Valve: The mitral valve is abnormal. There is mild calcification of the mitral valve leaflet(s). Mild mitral annular calcification. Mild mitral valve regurgitation. Tricuspid Valve: The tricuspid valve is grossly normal. Tricuspid valve regurgitation is mild. Aortic Valve: The aortic valve is tricuspid. There is mild aortic valve annular calcification. Aortic valve regurgitation is not visualized. Pulmonic Valve: The pulmonic valve was grossly normal. Pulmonic valve regurgitation is trivial. Aorta: The aortic root is normal in size and structure. Venous: The inferior vena cava is normal in size with less than 50% respiratory variability, suggesting right atrial pressure of 8 mmHg. IAS/Shunts: No atrial level shunt detected by color flow Doppler. Agitated saline contrast was given intravenously to evaluate for intracardiac shunting. Agitated saline contrast bubble study was negative, with no evidence of any interatrial shunt.  LEFT  VENTRICLE PLAX 2D LVIDd:         5.40 cm LVIDs:         4.70 cm LV PW:         1.70 cm LV IVS:        1.40 cm LVOT diam:     1.90 cm LV SV:         29 LV SV Index:   13 LVOT Area:     2.84 cm  RIGHT VENTRICLE RV S prime:  8.70 cm/s TAPSE (M-mode): 1.3 cm LEFT ATRIUM             Index       RIGHT ATRIUM           Index LA diam:        4.30 cm 1.99 cm/m  RA Area:     20.80 cm LA Vol (A2C):   80.1 ml 37.02 ml/m RA Volume:   59.50 ml  27.50 ml/m LA Vol (A4C):   61.3 ml 28.33 ml/m LA Biplane Vol: 74.4 ml 34.38 ml/m  AORTIC VALVE LVOT Vmax:   62.00 cm/s LVOT Vmean:  43.500 cm/s LVOT VTI:    0.102 m  AORTA Ao Root diam: 2.90 cm MITRAL VALVE MV Area (PHT): 4.57 cm     SHUNTS MV Decel Time: 166 msec     Systemic VTI:  0.10 m MV E velocity: 107.00 cm/s  Systemic Diam: 1.90 cm MV A velocity: 71.60 cm/s MV E/A ratio:  1.49 Nona Dell MD Electronically signed by Nona Dell MD Signature Date/Time: 08/25/2020/3:54:59 PM    Final      CBC Recent Labs  Lab 08/24/20 1624 08/27/20 0434  WBC 7.2 6.6  HGB 14.1 12.7  HCT 42.2 39.8  PLT 202 192  MCV 94.2 98.5  MCH 31.5 31.4  MCHC 33.4 31.9  RDW 15.2 16.4*  LYMPHSABS 1.5  --   MONOABS 0.3  --   EOSABS 0.2  --   BASOSABS 0.1  --     Chemistries  Recent Labs  Lab 08/24/20 1624 08/25/20 0349 08/27/20 0434  NA 138  --  139  K 3.6  --  3.8  CL 104  --  103  CO2 24  --  26  GLUCOSE 207*  --  148*  BUN 16  --  16  CREATININE 0.96  --  1.01*  CALCIUM 9.1  --  9.1  MG 1.8  --  1.8  AST 17 17  --   ALT 22 20  --   ALKPHOS 79 72  --   BILITOT 1.8* 1.2  --    ------------------------------------------------------------------------------------------------------------------ Recent Labs    08/25/20 0349  CHOL 181  HDL 39*  LDLCALC 110*  TRIG 160*  CHOLHDL 4.6    Lab Results  Component Value Date   HGBA1C 9.8 (H) 08/25/2020    ------------------------------------------------------------------------------------------------------------------ Recent Labs    08/27/20 0434  TSH 5.372*   ------------------------------------------------------------------------------------------------------------------ No results for input(s): VITAMINB12, FOLATE, FERRITIN, TIBC, IRON, RETICCTPCT in the last 72 hours.  Coagulation profile Recent Labs  Lab 08/24/20 1624  INR 1.1    No results for input(s): DDIMER in the last 72 hours.  Cardiac Enzymes No results for input(s): CKMB, TROPONINI, MYOGLOBIN in the last 168 hours.  Invalid input(s): CK ------------------------------------------------------------------------------------------------------------------    Component Value Date/Time   BNP 699.0 (H) 03/08/2020 1738     Kendell Bane M.D on 08/27/2020 at 12:45 PM  Go to [email or web address] - for contact info  [facility] - Office  [phone]

## 2020-08-27 NOTE — Progress Notes (Signed)
[facility] night shift.  The patient complaining of 8/10 right lower back pain and requested something for pain.  Hydromorphone 1 mg and ondansetron 4 mg IVP ordered.  Her abdomen was soft and nontender.  There was no CVA tenderness on percussion.  The nursing staff noticed that she was tender to palpation earlier on her lower right paraspinal area, but this has subsided after analgesic was given.  I will obtain an urinalysis to rule out UTI, but seems like this pain musculoskeletal.  Sanda Klein, MD.

## 2020-08-27 NOTE — Progress Notes (Signed)
Patients blood pressure 180/139. Patient is resting in bed. Patient responding to voice. This morning meds held due to patient being able to tolerate PO's. Dr. Flossie Dibble  made aware.

## 2020-08-28 DIAGNOSIS — F32A Depression, unspecified: Secondary | ICD-10-CM

## 2020-08-28 DIAGNOSIS — I10 Essential (primary) hypertension: Secondary | ICD-10-CM | POA: Diagnosis not present

## 2020-08-28 DIAGNOSIS — E118 Type 2 diabetes mellitus with unspecified complications: Secondary | ICD-10-CM

## 2020-08-28 DIAGNOSIS — I639 Cerebral infarction, unspecified: Secondary | ICD-10-CM | POA: Diagnosis not present

## 2020-08-28 DIAGNOSIS — I5042 Chronic combined systolic (congestive) and diastolic (congestive) heart failure: Secondary | ICD-10-CM

## 2020-08-28 DIAGNOSIS — E039 Hypothyroidism, unspecified: Secondary | ICD-10-CM

## 2020-08-28 DIAGNOSIS — R9431 Abnormal electrocardiogram [ECG] [EKG]: Secondary | ICD-10-CM

## 2020-08-28 LAB — GLUCOSE, CAPILLARY
Glucose-Capillary: 155 mg/dL — ABNORMAL HIGH (ref 70–99)
Glucose-Capillary: 168 mg/dL — ABNORMAL HIGH (ref 70–99)
Glucose-Capillary: 136 mg/dL — ABNORMAL HIGH (ref 70–99)
Glucose-Capillary: 175 mg/dL — ABNORMAL HIGH (ref 70–99)

## 2020-08-28 LAB — T4, FREE: Free T4: 1.1 ng/dL (ref 0.61–1.12)

## 2020-08-28 MED ORDER — INSULIN GLARGINE 100 UNIT/ML ~~LOC~~ SOLN: 6.0000 [IU] | Freq: Every day | SUBCUTANEOUS | 11 refills | Status: DC

## 2020-08-28 MED ORDER — INSULIN ASPART 100 UNIT/ML IJ SOLN: 0.0000 [IU] | Freq: Three times a day (TID) | INTRAMUSCULAR | 11 refills | Status: DC

## 2020-08-28 MED ORDER — ASPIRIN 81 MG PO TBEC: 81.0000 mg | DELAYED_RELEASE_TABLET | Freq: Every day | ORAL | 11 refills | Status: DC

## 2020-08-28 MED ORDER — LEVOTHYROXINE SODIUM 100 MCG PO TABS: 100.0000 ug | ORAL_TABLET | Freq: Every day | ORAL | 0 refills | Status: DC

## 2020-08-28 MED ORDER — CLOPIDOGREL BISULFATE 75 MG PO TABS: 75.0000 mg | ORAL_TABLET | Freq: Every day | ORAL | 0 refills | Status: AC

## 2020-08-28 MED ORDER — LABETALOL HCL 200 MG PO TABS
100.0000 mg | ORAL_TABLET | Freq: Two times a day (BID) | ORAL | Status: DC
  Administered 2020-08-28 – 2020-08-30 (×6): 100 mg via ORAL
  Filled 2020-08-28 (×6): qty 1

## 2020-08-28 NOTE — Progress Notes (Signed)
Pt BP was 191/154 automatic  Nurse checked BP manually it was 180/120 MD notified no new orders at this time

## 2020-08-28 NOTE — Discharge Summary (Addendum)
Physician Discharge Summary [facility]    Patient: Connie Shepard                   Admit date: 08/24/2020   DOB: [birth year]             Discharge date:09/07/2020/10:53 AM WUJ:[id number]                          PCP: Toma Deiters, MD  Disposition:: Inpatient SNF for rehabilitation [facility] in [place])  Recommendations for Outpatient Follow-up:   . Follow up: Neurologist in 2-3 weeks . Follow-up with PCP in 1 week after discharge from SNF  Discharge Condition: Stable   Code Status:   Code Status: Full Code  Diet recommendation: Diabetic diet, low sodium and low calorie diet   Discharge Diagnoses:    Principal Problem:   Acute CVA (cerebrovascular accident) [facility]) Active Problems:   Essential hypertension   Depression   Hypothyroidism   Type 2 diabetes mellitus with complication, without long-term current use of insulin (HCC)   Borderline prolonged QT interval   Chronic combined systolic and diastolic congestive heart failure (HCC)   History of Present Illness/ Hospital Course Charline Bills Summary:    Brief Summary: 55 y.o.femalewith medical history significant ofchronic combined systolic and diastolic heart failure, morbid obesity, hypertension, ADD, history of bronchitis, depression, admitted on 08/24/2020 with acute stroke with right-sided hemiparesis and speech and swallowing concerns   --------------------------------------------------------------------------------------------------------------------------- Assessment and plan 1) Acute CVA--  MRI brain shows Acute lacunar type infarct involving the posterior limb of the left internal capsule. Additional small subacute lacunar type infarct involving the left periatrial white matter. -Echo with EF of 20%, with global hypokinesis -Agitated bubble study without evidence of interatrial shunt -MRA head without LVO, carotid artery Dopplers without hemodynamically significant stenosis -On aspirin, Plavix,  Lipitor -PT and OT eval appreciated recommends acute inpatient rehab (CIR)--patient will need ongoing physical therapy, Occupational Therapy and speech therapy involvement LDL is 110, HDL 39 -in no acute distress and ready for discharge  2)FEN--- speech and swallow eval appreciated, recommended Dysphagia 3 (Mech soft); thin liquid --Had swallow study- MBS on 08/26/2020 -No straws, tablets/pills should be taken in applesauce -reassessment by speech therapy at SNF recommended.   3)DM2-  -A1c 9.8 reflecting uncontrolled DM with hyperglycemia PTA -continue lantus and SSI -resume metformin -will recommend initiation of farxiga (in order to help controlling sugar and to further assist with CHF management) - Accu-Cheks/Fingersticks as ordered -modified carbohydrates diet encouraged    4)HTN--allow permissive hypertension   -Resuming Entresto, Aldactone, Coreg, torsemide -low sodium diet recommended   5)HFrEF--patient with chronic systolic dysfunction CHF with EF in the 20% range, BP meds was initially held to allow for permissive hypertension Will resume and continue Entresto, torsemide as well as Aldactone -Okay to titrate Coreg up to PTA dose of 12.5 mg bid -continue to follow daily weights, low sodium diet and adequate hydration.  6)Depression/insomnia-- -no SI or hallucinations -holding trazodone due to prolonged QT -resume Wellbutrin and Lexapro  7) Hypothyroidism--continue levothyroxine at adjusted dose - TSH elevated to 5.372, free T4 1.10 -repeat thyroid panel in 3 months  8) Encephalopathy  -mentation back to baseline; oriented X 3 currently. - UA was checked overnight, greater than 500 glucose, negative for nitrites or leukocyte esterase no bacteria, WBC > 50 -patient empirically treated with rocephin X 3 days during hospitalization -no dysuria currently.  9) morbid obesity -Body mass index is 43.52  kg/m. -low calorie diet, portion control and increased physical  activity discussed with patient.    Disposition: The patient is from: Home   Anticipated d/c is to: SNF 09/07/20   CODE STATUS-full code   Family Communication:     (patient is alert, awake and coherent); plan of care discussed with patient directly.   Consults  :  Neurology  Nutritional status:   The patient's BMI is: Body mass index is 43.52 kg/m. I agree with the assessment and plan as outlined   Discharge Instructions:   Discharge Instructions    Activity as tolerated - No restrictions   Complete by: As directed    Call MD for:  difficulty breathing, headache or visual disturbances   Complete by: As directed    Call MD for:  persistant dizziness or light-headedness   Complete by: As directed    Diet - low sodium heart healthy   Complete by: As directed    Discharge instructions   Complete by: As directed    Continue current recommended medications, aggressive PT OT, precautions   Increase activity slowly   Complete by: As directed        Medication List    STOP taking these medications   hydrALAZINE 50 MG tablet Commonly known as: APRESOLINE   losartan 50 MG tablet Commonly known as: COZAAR   traZODone 50 MG tablet Commonly known as: DESYREL   zolpidem 5 MG tablet Commonly known as: AMBIEN     TAKE these medications   aspirin 81 MG EC tablet Take 1 tablet (81 mg total) by mouth daily with breakfast. Swallow whole. Start taking on: Aug 29, 2020   buPROPion 150 MG 24 hr tablet Commonly known as: Wellbutrin XL Take 1 tablet (150 mg total) by mouth daily.   carvedilol 12.5 MG tablet Commonly known as: COREG Take 1 tablet (12.5 mg total) by mouth 2 (two) times daily with a meal.   clopidogrel 75 MG tablet Commonly known as: PLAVIX Take 1 tablet (75 mg total) by mouth daily. Start taking on: Aug 29, 2020   Entresto 49-51 MG Generic drug: sacubitril-valsartan Take 1 tablet by mouth 2 (two) times daily.   escitalopram  20 MG tablet Commonly known as: LEXAPRO Take 1 tablet (20 mg total) by mouth daily.   insulin aspart 100 UNIT/ML injection Commonly known as: novoLOG Inject 0-20 Units into the skin 3 (three) times daily with meals.   insulin glargine 100 UNIT/ML injection Commonly known as: LANTUS Inject 0.06 mLs (6 Units total) into the skin at bedtime.   levothyroxine 100 MCG tablet Commonly known as: SYNTHROID Take 1 tablet (100 mcg total) by mouth daily at 6 (six) AM. Start taking on: Aug 29, 2020 What changed:   medication strength  how much to take  when to take this   metFORMIN 500 MG tablet Commonly known as: GLUCOPHAGE Take 1 tablet (500 mg total) by mouth daily with breakfast.   potassium chloride SA 20 MEQ tablet Commonly known as: KLOR-CON Take 1 tablet (20 mEq total) by mouth 2 (two) times daily.   spironolactone 25 MG tablet Commonly known as: ALDACTONE Take 1 tablet (25 mg total) by mouth daily.   torsemide 20 MG tablet Commonly known as: Demadex Take 2 tablets (40 mg total) by mouth 2 (two) times daily.   Vitamin D (Ergocalciferol) 1.25 MG (50000 UNIT) Caps capsule Commonly known as: DRISDOL Take 1 capsule (50,000 Units total) by mouth every 7 (seven) days.  Allergies  Allergen Reactions  . Hydroxyzine     Bad dreams     Procedures /Studies:   CT HEAD WO CONTRAST  Result Date: 08/24/2020 CLINICAL DATA:  Acute neural deficit. EXAM: CT HEAD WITHOUT CONTRAST TECHNIQUE: Contiguous axial images were obtained from the base of the skull through the vertex without intravenous contrast. COMPARISON:  March 11, 2020 FINDINGS: Brain: No evidence of acute hemorrhage, hydrocephalus, extra-axial collection or mass lesion/mass effect. Areas of hypoattenuation in the subcortical matter of the bilateral frontal lobes and right occipital lobe may represent asymmetric microvascular ischemic changes or other white matter disease. Vascular: Calcific atherosclerotic disease  of the intra cavernous carotid arteries. Skull: Normal. Negative for fracture or focal lesion. Sinuses/Orbits: No acute finding. Other: None. IMPRESSION: 1. Areas of hypoattenuation in the subcortical matter of the bilateral frontal lobes and right occipital lobe may represent asymmetric microvascular ischemic changes or other white matter disease. 2. No evidence of acute intracranial hemorrhage. Electronically Signed   By: Ted Mcalpine M.D.   On: 08/24/2020 17:10   CT CERVICAL SPINE WO CONTRAST  Result Date: 08/24/2020 CLINICAL DATA:  Status post fall. EXAM: CT CERVICAL SPINE WITHOUT CONTRAST TECHNIQUE: Multidetector CT imaging of the cervical spine was performed without intravenous contrast. Multiplanar CT image reconstructions were also generated. COMPARISON:  None. FINDINGS: Alignment: Normal. Skull base and vertebrae: No acute fracture. No primary bone lesion or focal pathologic process. Soft tissues and spinal canal: No prevertebral fluid or swelling. No visible canal hematoma. Disc levels:  Normal. Upper chest: Negative. Other: None. IMPRESSION: No evidence of acute traumatic injury to the cervical spine. Electronically Signed   By: Ted Mcalpine M.D.   On: 08/24/2020 17:13   MR ANGIO HEAD WO CONTRAST  Result Date: 08/24/2020 CLINICAL DATA:  Right-sided facial droop and right-sided weakness EXAM: MRI HEAD WITHOUT CONTRAST MRA HEAD WITHOUT CONTRAST TECHNIQUE: Multiplanar, multi-echo pulse sequences of the brain and surrounding structures were acquired without intravenous contrast. Angiographic images of the Circle of Willis were acquired using MRA technique without intravenous contrast. COMPARISON: No pertinent prior exam. COMPARISON:  None FINDINGS: MRI HEAD DWI and sagittal T1 sequences were obtained. Patient could not tolerate remainder. There is a subcentimeter infarct involving the posterior limb of the left internal capsule. Additional area of diffusion hyperintensity without  restriction along the left periatrial white matter. MRA HEAD Motion artifact is present. Intracranial internal carotid arteries and proximal anterior and middle cerebral arteries appear patent. Intracranial vertebral arteries, basilar artery, and proximal left posterior cerebral artery appear patent. Right P1 PCA is patent. There is poor flow related enhancement of the remainder of the proximal right PCA. IMPRESSION: Acute lacunar type infarct involving the posterior limb of the left internal capsule. Additional small subacute lacunar type infarct involving the left periatrial white matter. Motion degraded MRA of the head. Possible stenosis of the proximal right PCA. Electronically Signed   By: Guadlupe Spanish M.D.   On: 08/24/2020 19:09   MR BRAIN WO CONTRAST  Result Date: 08/24/2020 CLINICAL DATA:  Right-sided facial droop and right-sided weakness EXAM: MRI HEAD WITHOUT CONTRAST MRA HEAD WITHOUT CONTRAST TECHNIQUE: Multiplanar, multi-echo pulse sequences of the brain and surrounding structures were acquired without intravenous contrast. Angiographic images of the Circle of Willis were acquired using MRA technique without intravenous contrast. COMPARISON: No pertinent prior exam. COMPARISON:  None FINDINGS: MRI HEAD DWI and sagittal T1 sequences were obtained. Patient could not tolerate remainder. There is a subcentimeter infarct involving the posterior limb of the left  internal capsule. Additional area of diffusion hyperintensity without restriction along the left periatrial white matter. MRA HEAD Motion artifact is present. Intracranial internal carotid arteries and proximal anterior and middle cerebral arteries appear patent. Intracranial vertebral arteries, basilar artery, and proximal left posterior cerebral artery appear patent. Right P1 PCA is patent. There is poor flow related enhancement of the remainder of the proximal right PCA. IMPRESSION: Acute lacunar type infarct involving the posterior limb of  the left internal capsule. Additional small subacute lacunar type infarct involving the left periatrial white matter. Motion degraded MRA of the head. Possible stenosis of the proximal right PCA. Electronically Signed   By: Guadlupe Spanish M.D.   On: 08/24/2020 19:09   US Carotid Bilateral (at [facility] and [facility] only)  Result Date: 08/25/2020 CLINICAL DATA:  Acute ischemic stroke EXAM: BILATERAL CAROTID DUPLEX ULTRASOUND TECHNIQUE: Wallace Cullens scale imaging, color Doppler and duplex ultrasound were performed of bilateral carotid and vertebral arteries in the neck. COMPARISON:  None. FINDINGS: Criteria: Quantification of carotid stenosis is based on velocity parameters that correlate the residual internal carotid diameter with NASCET-based stenosis levels, using the diameter of the distal internal carotid lumen as the denominator for stenosis measurement. The following velocity measurements were obtained: RIGHT ICA: 48/10 cm/sec CCA: 52/8 cm/sec SYSTOLIC ICA/CCA RATIO:  0.9 ECA: 40 cm/sec LEFT ICA: 57/25 cm/sec CCA: 69/13 cm/sec SYSTOLIC ICA/CCA RATIO:  0.8 ECA: 41 cm/sec RIGHT CAROTID ARTERY: No significant atheromatous plaque. RIGHT VERTEBRAL ARTERY:  Antegrade flow. LEFT CAROTID ARTERY:  No significant atheromatous plaque. LEFT VERTEBRAL ARTERY:  Antegrade flow. IMPRESSION: No significant stenosis internal carotid arteries. Electronically Signed   By: Acquanetta Belling M.D.   On: 08/25/2020 10:54   DG Swallowing Func-Speech Pathology  Result Date: 08/26/2020 Objective Swallowing Evaluation: Type of Study: MBS-Modified Barium Swallow Study  Patient Details Name: Chasta Deshpande MRN: [id number] Date of Birth: [birth year] Today's Date: 08/26/2020 Time: SLP Start Time (ACUTE ONLY): 6440 -SLP Stop Time (ACUTE ONLY): 1025 SLP Time Calculation (min) (ACUTE ONLY): 43 min Past Medical History: Past Medical History: Diagnosis Date . Acute heart failure (HCC)  . ADD (attention deficit disorder)  . Bronchitis  . Depression  . Diabetes mellitus  without complication (HCC)  . Elevated troponin  . Hypertension  . Obesity  Past Surgical History: Past Surgical History: Procedure Laterality Date . CESAREAN SECTION   HPI: Gabriele Zwilling is a 55 y.o. female with medical history significant of chronic combined systolic and diastolic heart failure, elevated troponin, hypertension, ADD, history of bronchitis, depression, class III obesity who was brought to the emergency department due to right-sided weakness after having a fall at home.  She was last seen well the previous date at 1700.  Her family became concerned and called EMS.  She initially refused to come to the ED.  She has motor aphasia and is unable to provide further information at this time.SLE requested. 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 Additional small subacute lacunar type infarct  involving the left periatrial white matter.  Assessment / Plan / Recommendation CHL IP CLINICAL IMPRESSIONS 08/26/2020 Clinical Impression Pt presents with mild pharyngeal dysphagia characterized by occasional flash penetration of thin liquids during the swallow; note one aspiration episode with pill administration that occured prior to the swallow which was likely related to coordination of pill/liquid. Further note deep flash penetration of thin liquids with a straw. With single sips and cues to swallow "hard and fast", note significant improvement manifested by  no penetration of thin liquids with this strategy. Pt exhibits adequate hyolaryngeal excursion and good laryngeal vestibule closure with a hard and fast swallow strategy; further note without "hard/fast swallow" note decreased laryngeal vestibule closure resulting in occasional episodes of penetration. Note flash penetration with NTL trials also, again note no penetration of NTL with single sip and cues to swallow "hard/fast". All penetration/aspiration episodes were sensed and followed by a strong  reflexive cough. Recommend initiate D3/mech soft diet with upgrade to thin liquids -- NO STRAWS - one sip at a time with reminders to swallow "hard and fast"; meds should be administered whole with applesauce. Pt will benefit from continued ST to reinforce compensatory strategies for dysphagia.  SLP Visit Diagnosis Dysarthria and anarthria (R47.1);Cognitive communication deficit (R41.841) Attention and concentration deficit following -- Frontal lobe and executive function deficit following -- Impact on safety and function Mild aspiration risk;Moderate aspiration risk   CHL IP TREATMENT RECOMMENDATION 08/26/2020 Treatment Recommendations Therapy as outlined in treatment plan below   Prognosis 08/26/2020 Prognosis for Safe Diet Advancement Good Barriers to Reach Goals -- Barriers/Prognosis Comment -- CHL IP DIET RECOMMENDATION 08/26/2020 SLP Diet Recommendations Dysphagia 3 (Mech soft) solids;Thin liquid Liquid Administration via No straw;Cup Medication Administration Whole meds with puree Compensations Slow rate;Small sips/bites Postural Changes Seated upright at 90 degrees   CHL IP OTHER RECOMMENDATIONS 08/26/2020 Recommended Consults -- Oral Care Recommendations Oral care BID Other Recommendations Clarify dietary restrictions   CHL IP FOLLOW UP RECOMMENDATIONS 08/26/2020 Follow up Recommendations Inpatient Rehab   CHL IP FREQUENCY AND DURATION 08/26/2020 Speech Therapy Frequency (ACUTE ONLY) min 2x/week Treatment Duration 1 week      CHL IP ORAL PHASE 08/26/2020 Oral Phase Impaired Oral - Pudding Teaspoon -- Oral - Pudding Cup -- Oral - Honey Teaspoon -- Oral - Honey Cup -- Oral - Nectar Teaspoon -- Oral - Nectar Cup -- Oral - Nectar Straw -- Oral - Thin Teaspoon -- Oral - Thin Cup -- Oral - Thin Straw -- Oral - Puree -- Oral - Mech Soft -- Oral - Regular -- Oral - Multi-Consistency -- Oral - Pill -- Oral Phase - Comment --  CHL IP PHARYNGEAL PHASE 08/26/2020 Pharyngeal Phase Impaired Pharyngeal- Pudding Teaspoon --  Pharyngeal -- Pharyngeal- Pudding Cup -- Pharyngeal -- Pharyngeal- Honey Teaspoon -- Pharyngeal -- Pharyngeal- Honey Cup -- Pharyngeal -- Pharyngeal- Nectar Teaspoon NT Pharyngeal -- Pharyngeal- Nectar Cup Penetration/Aspiration during swallow;Pharyngeal residue - valleculae;Pharyngeal residue - pyriform Pharyngeal Material enters airway, remains ABOVE vocal cords then ejected out Pharyngeal- Nectar Straw NT Pharyngeal -- Pharyngeal- Thin Teaspoon Penetration/Aspiration during swallow;Pharyngeal residue - valleculae;Pharyngeal residue - pyriform Pharyngeal Material enters airway, remains ABOVE vocal cords then ejected out Pharyngeal- Thin Cup Penetration/Aspiration before swallow;Penetration/Aspiration during swallow;Moderate aspiration Pharyngeal Material enters airway, passes BELOW cords and not ejected out despite cough attempt by patient;Material enters airway, passes BELOW cords then ejected out Pharyngeal- Thin Straw Penetration/Aspiration during swallow;Pharyngeal residue - valleculae;Pharyngeal residue - pyriform Pharyngeal Material enters airway, passes BELOW cords then ejected out Pharyngeal- Puree WFL Pharyngeal -- Pharyngeal- Mechanical Soft NT Pharyngeal -- Pharyngeal- Regular WFL Pharyngeal -- Pharyngeal- Multi-consistency NT Pharyngeal -- Pharyngeal- Pill NT Pharyngeal -- Pharyngeal Comment --  CHL IP CERVICAL ESOPHAGEAL PHASE 08/26/2020 Cervical Esophageal Phase WFL Pudding Teaspoon -- Pudding Cup -- Honey Teaspoon -- Honey Cup -- Nectar Teaspoon -- Nectar Cup -- Nectar Straw -- Thin Teaspoon -- Thin Cup -- Thin Straw -- Puree -- Mechanical Soft -- Regular -- Multi-consistency -- Pill -- Cervical Esophageal Comment -- Lauris Poag  Pennelope Bracken MA, CCC-SLP Speech Language Pathologist [name] 08/26/2020, 10:52 AM              ECHOCARDIOGRAM COMPLETE BUBBLE STUDY  Result Date: 08/25/2020    ECHOCARDIOGRAM REPORT   Patient Name:   ADELINE PETITFRERE Date of Exam: 08/25/2020 Medical Rec #:  [id number]     Height:       64.0 in Accession #:    [id number]   Weight:       253.5 lb Date of Birth:  Apr 17, 1965   BSA:          2.164 m Patient Age:    54 years     BP:           144/84 mmHg Patient Gender: F            HR:           88 bpm. [facility] Procedure: 2D Echo, Cardiac Doppler, Color Doppler and Saline Contrast Bubble            Study Indications:    Stroke 434.91/l63.9  History:        Patient has prior history of Echocardiogram examinations, most                 recent 03/09/2020. Risk Factors:Hypertension and Diabetes. Acute                 CVA (cerebrovascular accident) [facility]), Chronic combined systolic                 and diastolic congestive heart failure (HCC), Obesity.  Sonographer:    Celesta Gentile RCS Referring Phys: [id number] DAVID MANUEL ORTIZ IMPRESSIONS  1. Left ventricular ejection fraction, by estimation, is approximately 20%. The left ventricle has severely decreased function. The left ventricle demonstrates global hypokinesis. There is moderate left ventricular hypertrophy. Left ventricular diastolic parameters are indeterminate.  2. Right ventricular systolic function is severely reduced. The right ventricular size is normal. Tricuspid regurgitation signal is inadequate for assessing PA pressure.  3. The mitral valve is abnormal, mildly calcified. Mild mitral valve regurgitation.  4. The aortic valve is tricuspid. Aortic valve regurgitation is not visualized.  5. The inferior vena cava is normal in size with <50% respiratory variability, suggesting right atrial pressure of 8 mmHg.  6. Agitated saline contrast bubble study was negative, with no evidence of any interatrial shunt. FINDINGS  Left Ventricle: Left ventricular ejection fraction, by estimation, is 20%. The left ventricle has severely decreased function. The left ventricle demonstrates global hypokinesis. The left ventricular internal cavity size was normal in size. There is moderate left ventricular hypertrophy. Left  ventricular diastolic parameters are indeterminate. Right Ventricle: The right ventricular size is normal. No increase in right ventricular wall thickness. Right ventricular systolic function is severely reduced. Tricuspid regurgitation signal is inadequate for assessing PA pressure. Left Atrium: Left atrial size was normal in size. Right Atrium: Right atrial size was normal in size. Pericardium: There is no evidence of pericardial effusion. Presence of pericardial fat pad. Mitral Valve: The mitral valve is abnormal. There is mild calcification of the mitral valve leaflet(s). Mild mitral annular calcification. Mild mitral valve regurgitation. Tricuspid Valve: The tricuspid valve is grossly normal. Tricuspid valve regurgitation is mild. Aortic Valve: The aortic valve is tricuspid. There is mild aortic valve annular calcification. Aortic valve regurgitation is not visualized. Pulmonic Valve: The pulmonic valve was grossly normal. Pulmonic valve regurgitation is trivial. Aorta: The aortic root is normal  in size and structure. Venous: The inferior vena cava is normal in size with less than 50% respiratory variability, suggesting right atrial pressure of 8 mmHg. IAS/Shunts: No atrial level shunt detected by color flow Doppler. Agitated saline contrast was given intravenously to evaluate for intracardiac shunting. Agitated saline contrast bubble study was negative, with no evidence of any interatrial shunt.  LEFT VENTRICLE PLAX 2D LVIDd:         5.40 cm LVIDs:         4.70 cm LV PW:         1.70 cm LV IVS:        1.40 cm LVOT diam:     1.90 cm LV SV:         29 LV SV Index:   13 LVOT Area:     2.84 cm  RIGHT VENTRICLE RV S prime:     8.70 cm/s TAPSE (M-mode): 1.3 cm LEFT ATRIUM             Index       RIGHT ATRIUM           Index LA diam:        4.30 cm 1.99 cm/m  RA Area:     20.80 cm LA Vol (A2C):   80.1 ml 37.02 ml/m RA Volume:   59.50 ml  27.50 ml/m LA Vol (A4C):   61.3 ml 28.33 ml/m LA Biplane Vol: 74.4 ml  34.38 ml/m  AORTIC VALVE LVOT Vmax:   62.00 cm/s LVOT Vmean:  43.500 cm/s LVOT VTI:    0.102 m  AORTA Ao Root diam: 2.90 cm MITRAL VALVE MV Area (PHT): 4.57 cm     SHUNTS MV Decel Time: 166 msec     Systemic VTI:  0.10 m MV E velocity: 107.00 cm/s  Systemic Diam: 1.90 cm MV A velocity: 71.60 cm/s MV E/A ratio:  1.49 Nona DellSamuel Mcdowell MD Electronically signed by Nona DellSamuel Mcdowell MD Signature Date/Time: 08/25/2020/3:54:59 PM    Final      Subjective:   Patient was seen and examined 09/07/2020, 11:33 AM Patient stable today. No acute distress.  No issues overnight Stable for discharge.  Discharge Exam:    Vitals:   08/27/20 1414 08/27/20 1656 08/27/20 1950 08/28/20 0526  BP: (!) 194/136 (!) 168/109 (!) 177/99 (!) 180/101  Pulse: 78 62 67 69  Resp: 20  18 18   Temp: 97.9 F (36.6 C)  97.9 F (36.6 C) 98.8 F (37.1 C)  TempSrc: Oral     SpO2: 96%  99% 99%  Weight:      Height:        General: Pt lying comfortably in bed & appears in no obvious distress. Cardiovascular: S1 & S2 heard, RRR, S1/S2 +. No murmurs, rubs, gallops or clicks. No JVD or pedal edema. Respiratory: Clear to auscultation without wheezing, rhonchi or crackles. No increased work of breathing. Abdominal:  Non-distended, non-tender & soft. No organomegaly or masses appreciated. Normal bowel sounds heard. CNS: Right-sided facial droop, dysarthric, right upper and lower extremity weakness alert and oriented. No focal deficits. Extremities: Right-sided weakness upper and lower extremity, no edema, no cyanosis    The results of significant diagnostics from this hospitalization (including imaging, microbiology, ancillary and laboratory) are listed below for reference.      Microbiology:   Recent Results (from the past 240 hour(s))  Resp Panel by RT-PCR (Flu A&B, Covid) Nasopharyngeal Swab     Status: None   Collection Time: 08/24/20  4:24  PM   Specimen: Nasopharyngeal Swab; Nasopharyngeal(NP) swabs in vial transport  medium  Result Value Ref Range Status   SARS Coronavirus 2 by RT PCR NEGATIVE NEGATIVE Final    Comment: (NOTE) SARS-CoV-2 target nucleic acids are NOT DETECTED.  The SARS-CoV-2 RNA is generally detectable in upper respiratory specimens during the acute phase of infection. The lowest concentration of SARS-CoV-2 viral copies this assay can detect is 138 copies/mL. A negative result does not preclude SARS-Cov-2 infection and should not be used as the sole basis for treatment or other patient management decisions. A negative result may occur with  improper specimen collection/handling, submission of specimen other than nasopharyngeal swab, presence of viral mutation(s) within the areas targeted by this assay, and inadequate number of viral copies(<138 copies/mL). A negative result must be combined with clinical observations, patient history, and epidemiological information. The expected result is Negative.  Fact Sheet for Patients:  BloggerCourse.com  Fact Sheet for Healthcare Providers:  SeriousBroker.it  This test is no t yet approved or cleared by the [place] FDA and  has been authorized for detection and/or diagnosis of SARS-CoV-2 by FDA under an Emergency Use Authorization (EUA). This EUA will remain  in effect (meaning this test can be used) for the duration of the COVID-19 declaration under Section 564(b)(1) of the Act, 21 U.S.C.section 360bbb-3(b)(1), unless the authorization is terminated  or revoked sooner.       Influenza A by PCR NEGATIVE NEGATIVE Final   Influenza B by PCR NEGATIVE NEGATIVE Final    Comment: (NOTE) The Xpert Xpress SARS-CoV-2/FLU/RSV plus assay is intended as an aid in the diagnosis of influenza from Nasopharyngeal swab specimens and should not be used as a sole basis for treatment. Nasal washings and aspirates are unacceptable for Xpert Xpress SARS-CoV-2/FLU/RSV testing.  Fact Sheet for  Patients: BloggerCourse.com  Fact Sheet for Healthcare Providers: SeriousBroker.it  This test is not yet approved or cleared by the [place] FDA and has been authorized for detection and/or diagnosis of SARS-CoV-2 by FDA under an Emergency Use Authorization (EUA). This EUA will remain in effect (meaning this test can be used) for the duration of the COVID-19 declaration under Section 564(b)(1) of the Act, 21 U.S.C. section 360bbb-3(b)(1), unless the authorization is terminated or revoked.  Performed at [facility], [address] [place], [place] [address]      Labs:   CBC: Recent Labs  Lab 08/24/20 1624 08/27/20 0434  WBC 7.2 6.6  NEUTROABS 5.1  --   HGB 14.1 12.7  HCT 42.2 39.8  MCV 94.2 98.5  PLT 202 192   Basic Metabolic Panel: Recent Labs  Lab 08/24/20 1624 08/27/20 0434  NA 138 139  K 3.6 3.8  CL 104 103  CO2 24 26  GLUCOSE 207* 148*  BUN 16 16  CREATININE 0.96 1.01*  CALCIUM 9.1 9.1  MG 1.8 1.8  PHOS 5.3*  --    Liver Function Tests: Recent Labs  Lab 08/24/20 1624 08/25/20 0349  AST 17 17  ALT 22 20  ALKPHOS 79 72  BILITOT 1.8* 1.2  PROT 7.1 6.7  ALBUMIN 3.5 3.2*   BNP (last 3 results) Recent Labs    03/08/20 1738  BNP 699.0*   Cardiac Enzymes: Recent Labs  Lab 08/24/20 1624  CKTOTAL 49   CBG: Recent Labs  Lab 08/27/20 0726 08/27/20 1112 08/27/20 1625 08/27/20 1945 08/28/20 0732  GLUCAP 162* 132* 146* 191* 155*   Thyroid function studies Recent Labs    08/27/20  0434  TSH 5.372*   Urinalysis    Component Value Date/Time   COLORURINE YELLOW 08/27/2020 0609   APPEARANCEUR TURBID (A) 08/27/2020 0609   LABSPEC 1.029 08/27/2020 0609   PHURINE 5.0 08/27/2020 0609   GLUCOSEU >=500 (A) 08/27/2020 0609   HGBUR NEGATIVE 08/27/2020 0609   BILIRUBINUR NEGATIVE 08/27/2020 0609   KETONESUR NEGATIVE 08/27/2020 0609   PROTEINUR 100 (A) 08/27/2020 0609   UROBILINOGEN 0.2  03/19/2010 1239   NITRITE NEGATIVE 08/27/2020 0609   LEUKOCYTESUR NEGATIVE 08/27/2020 0609      Time coordinating discharge: Over 35 minutes  Vassie Loll MD [phone]

## 2020-08-28 NOTE — PMR Pre-admission (Incomplete)
PMR Admission Coordinator Pre-Admission Assessment  Patient: Connie Shepard is an 55 y.o., female MRN: [id number] DOB: [birth year] Height: 5\' 4"  (162.6 cm) Weight: 115 kg  Insurance Information HMO: ***    PPO: ***     PCP:      IPA:      80/20:      OTHER:  PRIMARY: [employer]      Policy#:      Subscriber: [id number] CM Name: ***      Phone#: ***     Fax#: *** Pre-Cert#: ***      Employer: *** Benefits:  Phone #: ***     Name: *** Nat Christen. Date: ***     Deduct: ***      Out of Pocket Max: ***      Life Max: *** CIR: ***      SNF: *** Outpatient: ***     Co-Pay: *** Home Health: ***      Co-Pay: *** DME: ***     Co-Pay: *** Providers: in-network SECONDARY: [place] [employer]    Policy#: Dolores Hoose o     Phone#: [phone]  Financial Counselor:       Phone#:   The "Data Collection Information Summary" for patients in Inpatient Rehabilitation Facilities with attached "Privacy Act Statement-Health Care Records" was provided and verbally reviewed with: {CHL IP Patient Family [phone]  Emergency Contact Information Contact Information    Name Relation Home Work Mobile   [id number]} Mother   [phone]   Fawne, Hughley Daughter   [phone]      Current Medical History  Patient Admitting Diagnosis: CVA History of Present Illness: *** Complete NIHSS TOTAL: 9  Patient's medical record from [facility] has been reviewed by the rehabilitation admission coordinator and physician.  Past Medical History  Past Medical History:  Diagnosis Date  . Acute heart failure (HCC)   . ADD (attention deficit disorder)   . Bronchitis   . Depression   . Diabetes mellitus without complication (HCC)   . Elevated troponin   . Hypertension   . Obesity     Family History   family history includes Heart disease in her father; Hypertension in her mother; Kidney Stones in her mother.  Prior Rehab/Hospitalizations Has the patient had prior rehab or  hospitalizations prior to admission? Yes  Has the patient had major surgery during 100 days prior to admission? No   Current Medications  Current Facility-Administered Medications:  .  acetaminophen (TYLENOL) tablet 650 mg, 650 mg, Oral, Q6H PRN **OR** acetaminophen (TYLENOL) suppository 650 mg, 650 mg, Rectal, Q6H PRN, [facility], MD .  aspirin EC tablet 325 mg, 325 mg, Oral, Once, Bobette Mo, MD .  aspirin EC tablet 81 mg, 81 mg, Oral, Q breakfast, Emokpae, Courage, MD, 81 mg at 08/28/20 0856 .  atorvastatin (LIPITOR) tablet 40 mg, 40 mg, Oral, Daily, Emokpae, Courage, MD, 40 mg at 08/28/20 0856 .  cefTRIAXone (ROCEPHIN) 1 g in sodium chloride 0.9 % 100 mL IVPB, 1 g, Intravenous, Q24H, Shahmehdi, Seyed A, MD, Last Rate: 200 mL/hr at 08/27/20 1350, 1 g at 08/27/20 1350 .  Chlorhexidine Gluconate Cloth 2 % PADS 6 each, 6 each, Topical, Daily, 08/29/20, Courage, MD, 6 each at 08/28/20 0903 .  clopidogrel (PLAVIX) tablet 75 mg, 75 mg, Oral, Once, 08/30/20, MD .  clopidogrel (PLAVIX) tablet 75 mg, 75 mg, Oral, Daily, Emokpae, Courage, MD, 75 mg at 08/28/20 0856 .  enoxaparin (LOVENOX) injection 40 mg, 40 mg,  Subcutaneous, Q24H, Bobette Mo, MD, 40 mg at 08/28/20 0857 .  insulin aspart (novoLOG) injection 0-20 Units, 0-20 Units, Subcutaneous, TID WC, Bobette Mo, MD, 4 Units at 08/28/20 [phone] .  insulin glargine (LANTUS) injection 6 Units, 6 Units, Subcutaneous, QHS, Emokpae, Courage, MD, 6 Units at 08/27/20 2256 .  labetalol (NORMODYNE) injection 10 mg, 10 mg, Intravenous, Q4H PRN, Emokpae, Courage, MD, 10 mg at 08/26/20 1641 .  labetalol (NORMODYNE) tablet 100 mg, 100 mg, Oral, BID, Shahmehdi, Seyed A, MD, 100 mg at 08/28/20 0856 .  levothyroxine (SYNTHROID) tablet 100 mcg, 100 mcg, Oral, Q0600, Shahmehdi, Seyed A, MD, 100 mcg at 08/28/20 0540 .  LORazepam (ATIVAN) injection 0.5 mg, 0.5 mg, Intravenous, Q6H PRN, Shahmehdi, Seyed A, MD, 0.5 mg at 08/28/20  0351 .  sodium chloride (OCEAN) 0.65 % nasal spray 1 spray, 1 spray, Each Nare, PRN, Emokpae, Courage, MD  Patients Current Diet:  Diet Order            Diet - low sodium heart healthy           DIET DYS 3 Room service appropriate? Yes; Fluid consistency: Thin  Diet effective now                 Precautions / Restrictions Precautions Precautions: Fall Precaution Comments: R side hemiparesis Restrictions Weight Bearing Restrictions: Yes   Has the patient had 2 or more falls or a fall with injury in the past year? Yes  Prior Activity Level    Prior Functional Level Self Care: Did the patient need help bathing, dressing, using the toilet or eating? Independent per mother's report  Indoor Mobility: Did the patient need assistance with walking from room to room (with or without device)? Needed some help  Stairs: Did the patient need assistance with internal or external stairs (with or without device)? Independent per mother's report  Functional Cognition: Did the patient need help planning regular tasks such as shopping or remembering to take medications? Independent per mother's report  Home Assistive Devices / Equipment Home Assistive Devices/Equipment: Energy manager (specify type) Home Equipment: Shower seat,Bedside commode,Cane - single point,Walker - 2 wheels,Grab bars - tub/shower,Wheelchair - manual  Prior Device Use: Indicate devices/aids used by the patient prior to current illness, exacerbation or injury? None of the above  Current Functional Level Cognition  Arousal/Alertness: Awake/alert Overall Cognitive Status: Within Functional Limits for tasks assessed Orientation Level: Oriented X4 General Comments: oriented x3 Attention: Sustained Sustained Attention:  (missed one month when saying in reverse) Memory: Appears intact Awareness: Impaired Awareness Impairment: Anticipatory impairment Problem Solving: Appears intact Safety/Judgment: Appears intact     Extremity Assessment (includes Sensation/Coordination)  Upper Extremity Assessment: Defer to OT evaluation RUE Deficits / Details: R UE completely flaccid with no trace movement. WFL P/ROM grossly. RUE Sensation: WNL RUE Coordination: decreased fine motor,decreased gross motor LUE Deficits / Details: WFL; 4+/5 grossly LUE Sensation: WNL LUE Coordination: WNL  Lower Extremity Assessment: Generalized weakness,RLE deficits/detail,LLE deficits/detail RLE Deficits / Details: grossly 2/5 except 0/5 ankle dorsiflexion RLE Sensation: WNL RLE Coordination: WNL LLE Deficits / Details: grossly 4+/5 LLE Sensation: WNL LLE Coordination: WNL    ADLs  Overall ADL's : Needs assistance/impaired Eating/Feeding: Set up,Minimal assistance,Sitting,Bed level Grooming: Wash/dry hands,Wash/dry face,Oral care,Moderate assistance,Sitting,Applying deodorant,Brushing hair,Bed level Upper Body Bathing: Moderate assistance,Sitting Lower Body Bathing: Bed level,Maximal assistance Upper Body Dressing : Moderate assistance,Sitting Lower Body Dressing: Total assistance,Bed level Lower Body Dressing Details (indicate cue type and reason): total assist to don socks at  bed level Toilet Transfer: Maximal assistance,Stand-pivot,Moderate assistance Toilet Transfer Details (indicate cue type and reason): simulated via EOB to chair transfer Toileting- Clothing Manipulation and Hygiene: Total assistance,Bed level Tub/ Shower Transfer: Maximal assistance,Total assistance,Tub bench Functional mobility during ADLs: Maximal assistance General ADL Comments: Clinical judgement used for all but lower body dressing which was actually observed.    Mobility  Overal bed mobility: Needs Assistance Bed Mobility: Supine to Sit Supine to sit: Mod assist,Max assist General bed mobility comments: increased time, labored movement    Transfers  Overall transfer level: Needs assistance Equipment used: Rolling walker (2 wheeled),1  person hand held assist Transfers: Sit to/from [employer] Sit to Stand: Mod assist Stand pivot transfers: Mod assist,Max assist General transfer comment: required right knee blocked to avoid falling, unable to hold onto walker with right hand due to weakness, required stand pivot to transfer to chair    Ambulation / Gait / Stairs / Wheelchair Mobility  Ambulation/Gait Ambulation/Gait assistance: Mod assist,Max assist Gait Distance (Feet): 4 Feet Assistive device: Rolling walker (2 wheeled) Gait Pattern/deviations: Decreased step length - right,Decreased step length - left,Decreased stance time - right,Decreased stride length,Shuffle General Gait Details: required right knee blocked to avoid loss of balance and limited to a few side steps before having to sit due to fatigue and anxiety due to fear of falling Gait velocity: decreased    Posture / Balance Dynamic Sitting Balance Sitting balance - Comments: fair/good supported with LUE Balance Overall balance assessment: Needs assistance Sitting-balance support: Feet supported,No upper extremity supported Sitting balance-Leahy Scale: Fair Sitting balance - Comments: fair/good supported with LUE Postural control: Right lateral lean,Posterior lean Standing balance support: During functional activity,Bilateral upper extremity supported Standing balance-Leahy Scale: Poor Standing balance comment: fair/poor using mostly LUE for support on RW    Special needs/care consideration Diabetic management Lantus: 6 units daily at bedtime; novoLOG: 0-20 units 3 times daily with meals Bladder incontinence and Designated visitor Mitzi Davenport, mother; Makira Holleman, daughter***   Previous Surveyor, minerals (from acute therapy documentation) Living Arrangements: Parent  Lives With: Family Available Help at Discharge: Family,Available 24 hours/day Type of Home: House Home Layout: Multi-level,Able to live on main level with  bedroom/bathroom Alternate Level Stairs-Rails: Right Alternate Level Stairs-Number of Steps: 12 to 2nd floor, 12 to 3rd floor Home Access: Stairs to enter Entrance Stairs-Rails: None Entrance Stairs-Number of Steps: 3 Bathroom Shower/Tub: Engineer, manufacturing systems: Handicapped height Home Care Services: Yes Type of Home Care Services: Homehealth aide Additional Comments: at baseline does not use assistive device  Discharge Living Setting Does the patient have any problems obtaining your medications?: No  Social/Family/Support Systems    Goals    Decrease burden of Care through IP rehab admission: NA  Possible need for SNF placement upon discharge: Not anticipated  Patient Condition: {PATIENT'S CONDITION:22832}  Preadmission Screen Completed By:  Domingo Pulse, 08/28/2020 11:56 AM ______________________________________________________________________   Discussed status with Dr. Marland Kitchen on *** at *** and received approval for admission today.  Admission Coordinator:  Domingo Pulse, CCC-SLP, time ***Dorna Bloom ***   Assessment/Plan: Diagnosis: 1. Does the need for close, 24 hr/day Medical supervision in concert with the patient's rehab needs make it unreasonable for this patient to be served in a less intensive setting? {yes_no_potentially:3041433} 2. Co-Morbidities requiring supervision/potential complications: *** 3. Due to {due XN:2355732}, does the patient require 24 hr/day rehab nursing? {yes_no_potentially:3041433} 4. Does the patient require coordinated care of a physician, rehab nurse, PT, OT, and SLP to  address physical and functional deficits in the context of the above medical diagnosis(es)? {yes_no_potentially:3041433} Addressing deficits in the following areas: {deficits:3041436} 5. Can the patient actively participate in an intensive therapy program of at least 3 hrs of therapy 5 days a week? {yes_no_potentially:3041433} 6. The potential for patient  to make measurable gains while on inpatient rehab is {potential:3041437} 7. Anticipated functional outcomes upon discharge from inpatient rehab: {functional outcomes:304600100} PT, {functional outcomes:304600100} OT, {functional outcomes:304600100} SLP 8. Estimated rehab length of stay to reach the above functional goals is: *** 9. Anticipated discharge destination: {anticipated dc setting:21604} 10. Overall Rehab/Functional Prognosis: {potential:3041437}   MD Signature: ***

## 2020-08-28 NOTE — Progress Notes (Signed)
Inpatient Rehab Admissions Coordinator:  Able to talk to pt via phone.  Explained CIR goals and expectations with her. She acknowledged understanding. She indicated that she is interested in pursuing CIR.  Will continue to follow.   Wolfgang Phoenix, MS, CCC-SLP Admissions Coordinator [phone]

## 2020-08-28 NOTE — Progress Notes (Signed)
Notified pt is requesting some cream for itching.. states lower extremities are itching.Connie Shepard awaiting orders.pt stable. Tolerated bed bath, peri care and hair was done by LPN,and NT . will monitor

## 2020-08-29 LAB — GLUCOSE, CAPILLARY
Glucose-Capillary: 174 mg/dL — ABNORMAL HIGH (ref 70–99)
Glucose-Capillary: 155 mg/dL — ABNORMAL HIGH (ref 70–99)
Glucose-Capillary: 160 mg/dL — ABNORMAL HIGH (ref 70–99)
Glucose-Capillary: 116 mg/dL — ABNORMAL HIGH (ref 70–99)

## 2020-08-29 LAB — URINE CULTURE

## 2020-08-29 LAB — T3, FREE: T3, Free: 2.7 pg/mL (ref 2.0–4.4)

## 2020-08-29 MED ORDER — DIPHENHYDRAMINE-ZINC ACETATE 2-0.1 % EX CREA
TOPICAL_CREAM | Freq: Two times a day (BID) | CUTANEOUS | Status: DC | PRN
  Filled 2020-08-29: qty 28

## 2020-08-29 MED ORDER — KETOROLAC TROMETHAMINE 30 MG/ML IJ SOLN
30.0000 mg | Freq: Four times a day (QID) | INTRAMUSCULAR | Status: DC | PRN
  Administered 2020-08-29 – 2020-09-02 (×6): 30 mg via INTRAVENOUS
  Filled 2020-08-29 (×6): qty 1

## 2020-08-29 MED ORDER — DIPHENHYDRAMINE HCL 25 MG PO CAPS
25.0000 mg | ORAL_CAPSULE | Freq: Once | ORAL | Status: AC | PRN
  Administered 2020-08-29: 25 mg via ORAL
  Filled 2020-08-29: qty 1

## 2020-08-29 NOTE — Progress Notes (Signed)
Inpatient Rehab Admissions Coordinator:   Attempted to speak with pt over the phone regarding insurance benefits for CIR.  Pt extremely lethargic/dysarthic and unable to participate in conversation.  Spoke to pt's mother, Clide Cliff, who was at bedside regarding CIR goals and expectations.  We discussed 2-3 week length of stay, at which point pt would have to d/c home.  I let her know that we would expect that [place] would still probably need physical assist/care for all tasks (standing, walking, bathing, dressing, feeding, etc).  Likely minimal assistance, but still 24/7 physical care.  Laverne states that pt will stay with her and her husband and d/c and they can provide this level of care.  I also reviewed pt's benefits with Laverne, as patient was not able to participate in the conversation, nor did she appear to understand what I was speaking about.  I let them know that the co-insurance for CIR was the same as for home health (pt's portion is 20%, not due up-front).  Pt also has [facility] Medicaid as a secondary policy and I let Laverne know I wasn't sure how that played into her bill/coverage.  Laverne verbalized understanding and states they are comfortable with patients benefits and proceeding with CIR.  I also left a message for pt's daughter, Sofie Hartigan, based on concerns expressed in [facility] note from 5/14.  Note PT and SLP have updated notes today so will start insurance process, but will need to assure pt has a safe dispo from CIR as her insurance will not approve a SNF stay following CIR admission.  Updated Heather Settle, LCSW.   Estill Dooms, PT, DPT Admissions Coordinator [phone] 08/29/20  3:41 PM

## 2020-08-29 NOTE — Plan of Care (Signed)
  Problem: Education: Goal: Knowledge of disease or condition will improve Outcome: Progressing Goal: Knowledge of secondary prevention will improve Outcome: Progressing   Problem: Coping: Goal: Will verbalize positive feelings about self Outcome: Progressing Goal: Will identify appropriate support needs Outcome: Progressing   Problem: Health Behavior/Discharge Planning: Goal: Ability to manage health-related needs will improve Outcome: Progressing   Problem: Self-Care: Goal: Ability to participate in self-care as condition permits will improve Outcome: Progressing Goal: Verbalization of feelings and concerns over difficulty with self-care will improve Outcome: Progressing Goal: Ability to communicate needs accurately will improve Outcome: Progressing   Problem: Nutrition: Goal: Risk of aspiration will decrease Outcome: Progressing Goal: Dietary intake will improve Outcome: Progressing   Problem: Intracerebral Hemorrhage Tissue Perfusion: Goal: Complications of Intracerebral Hemorrhage will be minimized Outcome: Progressing   Problem: Ischemic Stroke/TIA Tissue Perfusion: Goal: Complications of ischemic stroke/TIA will be minimized Outcome: Progressing   Problem: Spontaneous Subarachnoid Hemorrhage Tissue Perfusion: Goal: Complications of Spontaneous Subarachnoid Hemorrhage will be minimized Outcome: Progressing   Problem: Education: Goal: Knowledge of General Education information will improve Description: Including pain rating scale, medication(s)/side effects and non-pharmacologic comfort measures Outcome: Progressing   Problem: Health Behavior/Discharge Planning: Goal: Ability to manage health-related needs will improve Outcome: Progressing   Problem: Clinical Measurements: Goal: Ability to maintain clinical measurements within normal limits will improve Outcome: Progressing Goal: Will remain free from infection Outcome: Progressing Goal: Diagnostic test  results will improve Outcome: Progressing Goal: Respiratory complications will improve Outcome: Progressing Goal: Cardiovascular complication will be avoided Outcome: Progressing   Problem: Activity: Goal: Risk for activity intolerance will decrease Outcome: Progressing   Problem: Nutrition: Goal: Adequate nutrition will be maintained Outcome: Progressing   Problem: Coping: Goal: Level of anxiety will decrease Outcome: Progressing   Problem: Elimination: Goal: Will not experience complications related to bowel motility Outcome: Progressing Goal: Will not experience complications related to urinary retention Outcome: Progressing   Problem: Pain Managment: Goal: General experience of comfort will improve Outcome: Progressing   Problem: Safety: Goal: Ability to remain free from injury will improve Outcome: Progressing   Problem: Skin Integrity: Goal: Risk for impaired skin integrity will decrease Outcome: Progressing   

## 2020-08-29 NOTE — Progress Notes (Signed)
Physical Therapy Treatment Patient Details Name: Connie Shepard MRN: [id number] DOB: 08-Oct-1965 Today's Date: 08/29/2020    History of Present Illness HPI: Shanai Lartigue is a 55 y.o. female with medical history significant of chronic combined systolic and diastolic heart failure, elevated troponin, hypertension, ADD, history of bronchitis, depression, class III obesity who was brought to the emergency department due to right-sided weakness after having a fall at home.  She was last seen well the previous date at 1700.  Her family became concerned and called EMS.  She initially refused to come to the ED.  She has motor aphasia and is unable to provide further information at this time.    PT Comments    Patient took pain medication prior to therapy due to low back pain.  Patient demonstrates slow labored movement for sitting up at bedside, once seated had difficulty maintaining balance with frequent falling over to the right, had to hold onto bed rail with LUE most of time, limited to keeping trunk in midline for up to 30-40 seconds when not using LUE.  Patient stood for up to 30 seconds supporting self mostly with left side, frequent buckling of right knee and unable to hold onto RW with right hand, frequent slipping when attempting to hold patient's right hand onto walker.  Patient able to laterally scoot using left hand to push down on RW and demonstrated fair/good return for using LUE and LLE to help reposition self in bed.  Patient will benefit from continued physical therapy in hospital and recommended venue below to increase strength, balance, endurance for safe ADLs and gait.   Follow Up Recommendations  CIR     Equipment Recommendations  Other (comment) (to be determined)    Recommendations for Other Services       Precautions / Restrictions Precautions Precautions: Fall Restrictions Weight Bearing Restrictions: No    Mobility  Bed Mobility Overal bed mobility: Needs  Assistance Bed Mobility: Supine to Sit     Supine to sit: Mod assist;Max assist;HOB elevated     General bed mobility comments: slow labored movement    Transfers Overall transfer level: Needs assistance   Transfers: Sit to/from Stand Sit to Stand: Mod assist;Max assist         General transfer comment: very unsteady on feet with right knee buckling and unable to hold onto RW with right hand  Ambulation/Gait                 Stairs             Wheelchair Mobility    Modified Rankin (Stroke Patients Only)       Balance Overall balance assessment: Needs assistance Sitting-balance support: Feet supported;No upper extremity supported Sitting balance-Leahy Scale: Poor Sitting balance - Comments: frequent falling over to the right Postural control: Right lateral lean Standing balance support: During functional activity;Bilateral upper extremity supported Standing balance-Leahy Scale: Poor Standing balance comment: poor using mostly LUE for support on RW                            Cognition Arousal/Alertness: Awake/alert Behavior During Therapy: [facility] for tasks assessed/performed Overall Cognitive Status: Within Functional Limits for tasks assessed                                        Exercises General Exercises - Lower  Extremity Long Arc Quad: Seated;AAROM;Strengthening;10 reps;Both;AROM    General Comments        Pertinent Vitals/Pain Pain Assessment: Faces Faces Pain Scale: Hurts little more Pain Location: low back Pain Descriptors / Indicators: Aching;Sore;Grimacing Pain Intervention(s): Limited activity within patient's tolerance;Monitored during session;Premedicated before session    Home Living                      Prior Function            PT Goals (current goals can now be found in the care plan section) Acute Rehab PT Goals Patient Stated Goal: return home after rehab PT Goal Formulation:  With patient Time For Goal Achievement: 09/08/20 Potential to Achieve Goals: Good Progress towards PT goals: Progressing toward goals    Frequency    Min 5X/week      PT Plan Current plan remains appropriate    Co-evaluation              AM-PAC PT "6 Clicks" Mobility   Outcome Measure  Help needed turning from your back to your side while in a flat bed without using bedrails?: A Lot Help needed moving from lying on your back to sitting on the side of a flat bed without using bedrails?: A Lot Help needed moving to and from a bed to a chair (including a wheelchair)?: A Lot Help needed standing up from a chair using your arms (e.g., wheelchair or bedside chair)?: A Lot Help needed to walk in hospital room?: Total Help needed climbing 3-5 steps with a railing? : Total 6 Click Score: 10    End of Session   Activity Tolerance: Patient tolerated treatment well;Patient limited by fatigue;Patient limited by pain Patient left: in bed;with call bell/phone within reach;with family/visitor present Nurse Communication: Mobility status PT Visit Diagnosis: Unsteadiness on feet (R26.81);Other abnormalities of gait and mobility (R26.89);Muscle weakness (generalized) (M62.81)     Time: 5053-9767 PT Time Calculation (min) (ACUTE ONLY): 22 min  Charges:  $Therapeutic Activity: 8-22 mins                     1:53 PM, 08/29/20 Ocie Bob, MPT Physical Therapist with [facility] 336 [phone] office [phone] mobile phone

## 2020-08-29 NOTE — Progress Notes (Signed)
  Speech Language Pathology Treatment: Dysphagia  Patient Details Name: Connie Shepard MRN: [id number] DOB: 10-23-1965 Today's Date: 08/29/2020 Time: 5726-2035 SLP Time Calculation (min) (ACUTE ONLY): 17 min  Assessment / Plan / Recommendation Clinical Impression  Pt seen for ongoing dysphagia intervention following MBSS completed on Friday. Pt with intermittent alertness as she was given pain medication earlier. Pt's mother was at bedside and reports poor po intake. Pt agreeable to eating some of her lunch meal and was encouraged to self feed. She required verbal cues for alertness. She was reminded to take small sips (no straws) and then swallow "fast and hard". No coughing exhibited this date. Continue diet as ordered and SLP to follow during acute stay.   HPI HPI: Connie Shepard is a 55 y.o. female with medical history significant of chronic combined systolic and diastolic heart failure, elevated troponin, hypertension, ADD, history of bronchitis, depression, class III obesity who was brought to the emergency department due to right-sided weakness after having a fall at home.  She was last seen well the previous date at 1700.  Her family became concerned and called EMS.  She initially refused to come to the ED.  She has motor aphasia and is unable to provide further information at this time.SLE requested. 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 Additional small subacute lacunar type infarct  involving the left periatrial white matter.      SLP Plan  Continue with current plan of care       Recommendations  Diet recommendations: Dysphagia 3 (mechanical soft);Thin liquid Liquids provided via: Cup;Straw Medication Administration: Whole meds with liquid Supervision: Staff to assist with self feeding;Full supervision/cueing for compensatory strategies Compensations: Slow rate;Small sips/bites Postural Changes and/or Swallow Maneuvers: Out  of bed for meals;Seated upright 90 degrees;Upright 30-60 min after meal                Oral Care Recommendations: Oral care BID Follow up Recommendations: Inpatient Rehab SLP Visit Diagnosis: Dysarthria and anarthria (R47.1);Cognitive communication deficit [phone]) Plan: Continue with current plan of care       Thank you,  Havery Moros, CCC-SLP [phone]                 Prospero Mahnke 08/29/2020, 3:26 PM

## 2020-08-29 NOTE — TOC Progression Note (Signed)
Transition of Care [facility]) - Progression Note    Patient Details  Name: Connie Shepard MRN: [id number] Date of Birth: 22-Jul-1965  Transition of [facility] CM/SW Contact  Annice Needy, LCSW Phone Number: 08/29/2020, 12:09 PM  Clinical Narrative:    No beds available at CIR today. Patient will need updated notes from therapies. Updated Notes requested from therapies.         Expected Discharge Plan and Services           Expected Discharge Date: 08/29/20                                     Social Determinants of Health (SDOH) Interventions    Readmission Risk Interventions No flowsheet data found.

## 2020-08-29 NOTE — Progress Notes (Signed)
Patient seen and evaluated this am with complaints of some low back pain today for which Toradol will be given as needed. She is still awaiting a bed placement at CIR. Please refer to discharge summary dictated on 5/15 for full details.  Discussed with father on phone 5/16.  Total care time: 20 minutes.

## 2020-08-30 LAB — GLUCOSE, CAPILLARY
Glucose-Capillary: 102 mg/dL — ABNORMAL HIGH (ref 70–99)
Glucose-Capillary: 227 mg/dL — ABNORMAL HIGH (ref 70–99)
Glucose-Capillary: 132 mg/dL — ABNORMAL HIGH (ref 70–99)
Glucose-Capillary: 151 mg/dL — ABNORMAL HIGH (ref 70–99)

## 2020-08-30 MED ORDER — LORAZEPAM 2 MG/ML IJ SOLN
2.0000 mg | Freq: Once | INTRAMUSCULAR | Status: AC
  Administered 2020-08-31: 2 mg via INTRAVENOUS
  Filled 2020-08-30: qty 1

## 2020-08-30 NOTE — Progress Notes (Signed)
Occupational Therapy Treatment Patient Details Name: Connie Shepard MRN: [id number] DOB: 29-Mar-1966 Today's Date: 08/30/2020    History of present illness Connie Shepard is a 55 y.o. female with medical history significant of chronic combined systolic and diastolic heart failure, elevated troponin, hypertension, ADD, history of bronchitis, depression, class III obesity who was brought to the emergency department due to right-sided weakness after having a fall at home.  She was last seen well the previous date at 1700.  Her family became concerned and called EMS.  She initially refused to come to the ED.  She has motor aphasia and is unable to provide further information at this time.   OT comments  Pt agreeable to OT treatment. Pt initially asking when she could go home. Pt reminded of CIR recommendation and informed of the therapeutic process at CIR. Pt appeared more receptive to the placement when informed that it is not a place to get left in a room, which the pt reported she did not want.  Pt informed of intense 3 hour of therapy demand and seemed more receptive. Pt demonstrated need for mod to max A for bed mobility with HOB elevated. Pt able to complete x3 sit to stand with mod/max A without use of RW. Pt continues to demonstrate a flaccid R UE. Pt able to complete x10 reps of R side lateral propping on elbow with mod A and a rest break after 5. Pt will continue to benefit from acute OT services to address deficit areas.   Follow Up Recommendations  CIR    Equipment Recommendations  None recommended by OT          Precautions / Restrictions Precautions Precautions: Fall Precaution Comments: R side hemiparesis Restrictions Weight Bearing Restrictions: No       Mobility Bed Mobility Overal bed mobility: Needs Assistance Bed Mobility: Supine to Sit     Supine to sit: Mod assist;Max assist;HOB elevated     General bed mobility comments: slow labored movement     Transfers Overall transfer level: Needs assistance   Transfers: Sit to/from Stand Sit to Stand: Mod assist;Max assist         General transfer comment: x3 sit to stand completed with mod - max A with this therapist providing support anteriorly while blocking R knee.    Balance Overall balance assessment: Needs assistance Sitting-balance support: Feet supported;No upper extremity supported Sitting balance-Leahy Scale: Poor Sitting balance - Comments: leaning posteriorly and to R side. Postural control: Right lateral lean Standing balance support: During functional activity;Bilateral upper extremity supported Standing balance-Leahy Scale: Poor Standing balance comment: during sit to stand with this therapist providing UE and torso support anteriorly.                           ADL either performed or assessed with clinical judgement   ADL                                                                 Cognition Arousal/Alertness: Awake/alert Behavior During Therapy: WFL for tasks assessed/performed Overall Cognitive Status: Within Functional Limits for tasks assessed  Exercises Exercises: Other exercises Other Exercises Other Exercises: Pt completed x10 R side lateral propping to R elbow with ~3 second hold and mod A.   Shoulder Instructions       General Comments      Pertinent Vitals/ Pain       Pain Assessment: No/denies pain                                                          Frequency  Min 2X/week        Progress Toward Goals  OT Goals(current goals can now be found in the care plan section)  Progress towards OT goals: Progressing toward goals  Acute Rehab OT Goals Patient Stated Goal: return home after rehab OT Goal Formulation: With patient Time For Goal Achievement: 09/08/20 Potential to Achieve Goals: Fair ADL  Goals Pt Will Perform Eating: with set-up;sitting;bed level Pt Will Perform Grooming: with min assist;with supervision;sitting Pt Will Perform Upper Body Dressing: with min assist;with supervision;with adaptive equipment;sitting Pt Will Perform Lower Body Dressing: with mod assist;with adaptive equipment;sitting/lateral leans;bed level Pt Will Transfer to Toilet: with min assist;grab bars;stand pivot transfer Pt/caregiver will Perform Home Exercise Program: Right Upper extremity;Increased ROM;With minimal assist Additional ADL Goal #1: Pt will demonstrated improved postural stability by sitting upright at EOB with SPV and completing lateral leans with Min guard to SPV while demosntrated R UE weight bearing to assist with lateral leans.  Plan Discharge plan remains appropriate                                    End of Session Equipment Utilized During Treatment: Oxygen (3 L O2 used intermittently.)  OT Visit Diagnosis: Unsteadiness on feet (R26.81);Other abnormalities of gait and mobility (R26.89);Other symptoms and signs involving the nervous system (R29.898);Cognitive communication deficit (R41.841);Hemiplegia and hemiparesis Symptoms and signs involving cognitive functions: Cerebral infarction Hemiplegia - Right/Left: Right Hemiplegia - dominant/non-dominant: Dominant Hemiplegia - caused by: Cerebral infarction   Activity Tolerance Patient tolerated treatment well   Patient Left in bed;with call bell/phone within reach;with bed alarm set   Nurse Communication Mobility status        Time: 0277-4128 OT Time Calculation (min): 38 min  Charges: OT General Charges $OT Visit: 1 Visit OT Treatments $Self Care/Home Management : 8-22 mins $Therapeutic Exercise: 23-37 mins  Jamyla Ard OT, MOT    Danie Chandler 08/30/2020, 12:06 PM

## 2020-08-30 NOTE — Progress Notes (Signed)
Patient seen and evaluated this a.m. with no significant concerns or complaints noted this morning.  No acute events noted overnight.  She is still awaiting a bed placement at CIR.  Please refer to discharge summary dictated on 5/15 for full details.  Total care time: 15 minutes.

## 2020-08-30 NOTE — TOC Progression Note (Signed)
Transition of Care [facility]) - Progression Note    Patient Details  Name: Connie Shepard MRN: [id number] Date of Birth: 01/02/1966  [facility] CM/SW Contact  Annice Needy, LCSW Phone Number: 08/30/2020, 4:16 PM  Clinical Narrative:    Berkley Harvey started by CIR on yesterday 5/16.        Expected Discharge Plan and Services           Expected Discharge Date: 08/29/20                                     Social Determinants of Health (SDOH) Interventions    Readmission Risk Interventions No flowsheet data found.

## 2020-08-30 NOTE — Progress Notes (Signed)
Inpatient Rehab Admissions Coordinator:   Awaiting determination from insurance regarding CIR auth.    Estill Dooms, PT, DPT Admissions Coordinator [phone] 08/30/20  1:25 PM

## 2020-08-30 NOTE — Progress Notes (Signed)
Inpatient Rehab Admissions Coordinator:   I was able to speak to patient's daughter, Sofie Hartigan, over the phone to discuss CIR goals/expectations and answer questions.  Sydnee relays that prior to pt's admission she had been declining in both mobility and mood for many months, refusing to seek medical attention, and with behavioral outbursts towards pt's parents.  Sofie Hartigan does have concerns that if pt refuses to participate in rehab program (in any setting) that pt's parents would not be able to care for her and she would ultimately need long term placement.  We discussed that insurance will not approve SNF for short term rehab following a CIR admission, so in some cases (I.e. need for long term care), SNF for rehab is the better option.  Sydnee would like to take the evening to follow up with her family and discuss this information and determine whether they feel that CIR or SNF is the better option at this time.  Insurance is still pending and I will let that continue to progress while they discuss.  Sydnee does also, again, express concern over whether her mother is able to make her own decisions, which I let her know would need to be addressed by the physician.     Estill Dooms, PT, DPT Admissions Coordinator [phone] 08/30/20  4:03 PM

## 2020-08-30 NOTE — Progress Notes (Signed)
Physical Therapy Treatment Patient Details Name: Connie Shepard MRN: [id number] DOB: October 18, 1965 Today's Date: 08/30/2020    History of Present Illness Connie Shepard is a 55 y.o. female with medical history significant of chronic combined systolic and diastolic heart failure, elevated troponin, hypertension, ADD, history of bronchitis, depression, class III obesity who was brought to the emergency department due to right-sided weakness after having a fall at home.  She was last seen well the previous date at 1700.  Her family became concerned and called EMS.  She initially refused to come to the ED.  She has motor aphasia and is unable to provide further information at this time.    PT Comments    Patient presents alert and cooperative with therapy.  Patient has to lean on LUE in order to maintain sitting balance at bedside, frequent falling over the right when not cued to rest bodyweight on left hand, able to complete sit to stands and take a couple of side steps with right side supported and leaning on RW with left hand.  Patient limited mostly due to c/o fatigue and fear of falling.  Patient tolerated sitting up in chair for up to 2 hours after therapy before requesting to go back to bed.  Patient will benefit from continued physical therapy in hospital and recommended venue below to increase strength, balance, endurance for safe ADLs and gait.    Follow Up Recommendations  CIR     Equipment Recommendations  Other (comment) (to be determined)    Recommendations for Other Services       Precautions / Restrictions Precautions Precautions: Fall Precaution Comments: R side hemiparesis Restrictions Weight Bearing Restrictions: No    Mobility  Bed Mobility Overal bed mobility: Needs Assistance Bed Mobility: Supine to Sit     Supine to sit: Mod assist;Max assist     General bed mobility comments: HOB flat, slow labored movement with frequent falling over to the right     Transfers Overall transfer level: Needs assistance Equipment used: Rolling walker (2 wheeled);1 person hand held assist Transfers: Sit to/from Stand Sit to Stand: Mod assist Stand pivot transfers: Mod assist;Max assist       General transfer comment: required right side supported during transfer to chair  Ambulation/Gait Ambulation/Gait assistance: Mod assist;Max assist Gait Distance (Feet): 2 Feet Assistive device: Rolling walker (2 wheeled);1 person hand held assist Gait Pattern/deviations: Decreased step length - right;Decreased step length - left;Decreased stance time - right;Decreased stride length;Shuffle Gait velocity: decreased   General Gait Details: limited to a couple of side steps with mostly shuffling of right foot with knee blocked   Stairs             Wheelchair Mobility    Modified Rankin (Stroke Patients Only)       Balance Overall balance assessment: Needs assistance Sitting-balance support: Feet supported;Single extremity supported Sitting balance-Leahy Scale: Fair Sitting balance - Comments: fair/poor with frequent leaning to the right Postural control: Right lateral lean Standing balance support: During functional activity;Single extremity supported Standing balance-Leahy Scale: Poor Standing balance comment: holding on RW with left hand while supported on the right side                            Cognition Arousal/Alertness: Awake/alert Behavior During Therapy: WFL for tasks assessed/performed Overall Cognitive Status: Within Functional Limits for tasks assessed  Exercises General Exercises - Lower Extremity Long Arc Quad: Seated;AAROM;Strengthening;Both;AROM;15 reps Hip Flexion/Marching: Seated;AAROM;Strengthening;15 reps;AROM;Both Other Exercises Other Exercises: Pt completed x10 R side lateral propping to R elbow with ~3 second hold and mod A.    General Comments         Pertinent Vitals/Pain Pain Assessment: No/denies pain Faces Pain Scale: Hurts little more Pain Location: low back Pain Descriptors / Indicators: Aching;Sore;Grimacing Pain Intervention(s): Limited activity within patient's tolerance;Monitored during session;Premedicated before session;Repositioned    Home Living                      Prior Function            PT Goals (current goals can now be found in the care plan section) Acute Rehab PT Goals Patient Stated Goal: return home after rehab PT Goal Formulation: With patient Time For Goal Achievement: 09/08/20 Potential to Achieve Goals: Good Progress towards PT goals: Progressing toward goals    Frequency    Min 5X/week      PT Plan Current plan remains appropriate    Co-evaluation              AM-PAC PT "6 Clicks" Mobility   Outcome Measure  Help needed turning from your back to your side while in a flat bed without using bedrails?: A Lot Help needed moving from lying on your back to sitting on the side of a flat bed without using bedrails?: A Lot Help needed moving to and from a bed to a chair (including a wheelchair)?: A Lot Help needed standing up from a chair using your arms (e.g., wheelchair or bedside chair)?: A Lot Help needed to walk in hospital room?: Total Help needed climbing 3-5 steps with a railing? : Total 6 Click Score: 10    End of Session   Activity Tolerance: Patient tolerated treatment well;Patient limited by fatigue Patient left: in chair;with call bell/phone within reach;with family/visitor present;with chair alarm set Nurse Communication: Mobility status PT Visit Diagnosis: Unsteadiness on feet (R26.81);Other abnormalities of gait and mobility (R26.89);Muscle weakness (generalized) (M62.81)     Time: 1308-6578 PT Time Calculation (min) (ACUTE ONLY): 27 min  Charges:  $Therapeutic Exercise: 8-22 mins $Therapeutic Activity: 8-22 mins                     2:51 PM,  08/30/20 Ocie Bob, MPT Physical Therapist with [facility] [phone] office [phone] mobile phone

## 2020-08-30 NOTE — Plan of Care (Signed)
  Problem: Education: Goal: Knowledge of disease or condition will improve Outcome: Progressing Goal: Knowledge of secondary prevention will improve Outcome: Progressing   Problem: Coping: Goal: Will verbalize positive feelings about self Outcome: Progressing Goal: Will identify appropriate support needs Outcome: Progressing   Problem: Health Behavior/Discharge Planning: Goal: Ability to manage health-related needs will improve Outcome: Progressing   Problem: Self-Care: Goal: Ability to participate in self-care as condition permits will improve Outcome: Progressing Goal: Verbalization of feelings and concerns over difficulty with self-care will improve Outcome: Progressing Goal: Ability to communicate needs accurately will improve Outcome: Progressing   Problem: Nutrition: Goal: Risk of aspiration will decrease Outcome: Progressing Goal: Dietary intake will improve Outcome: Progressing   Problem: Intracerebral Hemorrhage Tissue Perfusion: Goal: Complications of Intracerebral Hemorrhage will be minimized Outcome: Progressing   Problem: Ischemic Stroke/TIA Tissue Perfusion: Goal: Complications of ischemic stroke/TIA will be minimized Outcome: Progressing   Problem: Spontaneous Subarachnoid Hemorrhage Tissue Perfusion: Goal: Complications of Spontaneous Subarachnoid Hemorrhage will be minimized Outcome: Progressing   Problem: Education: Goal: Knowledge of General Education information will improve Description: Including pain rating scale, medication(s)/side effects and non-pharmacologic comfort measures Outcome: Progressing   Problem: Health Behavior/Discharge Planning: Goal: Ability to manage health-related needs will improve Outcome: Progressing   Problem: Clinical Measurements: Goal: Ability to maintain clinical measurements within normal limits will improve Outcome: Progressing Goal: Will remain free from infection Outcome: Progressing Goal: Diagnostic test  results will improve Outcome: Progressing Goal: Respiratory complications will improve Outcome: Progressing Goal: Cardiovascular complication will be avoided Outcome: Progressing   Problem: Activity: Goal: Risk for activity intolerance will decrease Outcome: Progressing   Problem: Nutrition: Goal: Adequate nutrition will be maintained Outcome: Progressing   Problem: Coping: Goal: Level of anxiety will decrease Outcome: Progressing   Problem: Elimination: Goal: Will not experience complications related to bowel motility Outcome: Progressing Goal: Will not experience complications related to urinary retention Outcome: Progressing   Problem: Pain Managment: Goal: General experience of comfort will improve Outcome: Progressing   Problem: Safety: Goal: Ability to remain free from injury will improve Outcome: Progressing   Problem: Skin Integrity: Goal: Risk for impaired skin integrity will decrease Outcome: Progressing

## 2020-08-31 LAB — GLUCOSE, CAPILLARY
Glucose-Capillary: 138 mg/dL — ABNORMAL HIGH (ref 70–99)
Glucose-Capillary: 176 mg/dL — ABNORMAL HIGH (ref 70–99)
Glucose-Capillary: 162 mg/dL — ABNORMAL HIGH (ref 70–99)
Glucose-Capillary: 140 mg/dL — ABNORMAL HIGH (ref 70–99)

## 2020-08-31 LAB — CREATININE, SERUM
Creatinine, Ser: 1.19 mg/dL — ABNORMAL HIGH (ref 0.44–1.00)
GFR, Estimated: 55 mL/min — ABNORMAL LOW (ref >60)

## 2020-08-31 MED ORDER — SPIRONOLACTONE 25 MG PO TABS
25.0000 mg | ORAL_TABLET | Freq: Every day | ORAL | Status: DC
  Administered 2020-08-31 – 2020-09-02 (×3): 25 mg via ORAL
  Filled 2020-08-31 (×4): qty 1

## 2020-08-31 MED ORDER — BUPROPION HCL ER (XL) 150 MG PO TB24
150.0000 mg | ORAL_TABLET | Freq: Every day | ORAL | Status: DC
  Administered 2020-08-31 – 2020-09-07 (×8): 150 mg via ORAL
  Filled 2020-08-31 (×8): qty 1

## 2020-08-31 MED ORDER — LORAZEPAM 2 MG/ML IJ SOLN
1.0000 mg | Freq: Four times a day (QID) | INTRAMUSCULAR | Status: DC | PRN
  Administered 2020-08-31 – 2020-09-05 (×6): 1 mg via INTRAVENOUS
  Filled 2020-08-31 (×6): qty 1

## 2020-08-31 MED ORDER — HYDRALAZINE HCL 25 MG PO TABS
25.0000 mg | ORAL_TABLET | Freq: Three times a day (TID) | ORAL | Status: DC
  Administered 2020-08-31 – 2020-09-02 (×6): 25 mg via ORAL
  Filled 2020-08-31 (×6): qty 1

## 2020-08-31 MED ORDER — ESCITALOPRAM OXALATE 10 MG PO TABS
20.0000 mg | ORAL_TABLET | Freq: Every day | ORAL | Status: DC
  Administered 2020-08-31 – 2020-09-07 (×8): 20 mg via ORAL
  Filled 2020-08-31 (×8): qty 2

## 2020-08-31 MED ORDER — LEVOTHYROXINE SODIUM 50 MCG PO TABS
50.0000 ug | ORAL_TABLET | Freq: Every day | ORAL | Status: DC
  Administered 2020-09-01 – 2020-09-07 (×7): 50 ug via ORAL
  Filled 2020-08-31 (×7): qty 1

## 2020-08-31 MED ORDER — LOSARTAN POTASSIUM 50 MG PO TABS
50.0000 mg | ORAL_TABLET | Freq: Every day | ORAL | Status: DC
  Administered 2020-08-31 – 2020-09-02 (×3): 50 mg via ORAL
  Filled 2020-08-31 (×4): qty 1

## 2020-08-31 MED ORDER — POLYETHYLENE GLYCOL 3350 17 G PO PACK
17.0000 g | PACK | Freq: Every day | ORAL | Status: DC
  Administered 2020-09-01 – 2020-09-07 (×6): 17 g via ORAL
  Filled 2020-08-31 (×7): qty 1

## 2020-08-31 MED ORDER — TORSEMIDE 20 MG PO TABS
40.0000 mg | ORAL_TABLET | Freq: Two times a day (BID) | ORAL | Status: DC
  Administered 2020-08-31 – 2020-09-02 (×6): 40 mg via ORAL
  Filled 2020-08-31 (×7): qty 2

## 2020-08-31 MED ORDER — SACUBITRIL-VALSARTAN 49-51 MG PO TABS
1.0000 | ORAL_TABLET | Freq: Two times a day (BID) | ORAL | Status: DC
  Administered 2020-08-31 – 2020-09-02 (×6): 1 via ORAL
  Filled 2020-08-31 (×7): qty 1

## 2020-08-31 MED ORDER — CARVEDILOL 12.5 MG PO TABS
12.5000 mg | ORAL_TABLET | Freq: Two times a day (BID) | ORAL | Status: DC
  Administered 2020-08-31 – 2020-09-07 (×15): 12.5 mg via ORAL
  Filled 2020-08-31 (×15): qty 1

## 2020-08-31 MED ORDER — HYDRALAZINE HCL 20 MG/ML IJ SOLN
10.0000 mg | Freq: Once | INTRAMUSCULAR | Status: AC
  Administered 2020-08-31: 10 mg via INTRAVENOUS
  Filled 2020-08-31: qty 1

## 2020-08-31 NOTE — Progress Notes (Signed)
SLP Cancellation Note  Patient Details Name: Connie Shepard MRN: [id number] DOB: [birth year]   Cancelled treatment:       Reason Eval/Treat Not Completed: Fatigue/lethargy limiting ability to participate. Pt's mother was present at bedside and reports Pt had "just fallen asleep" and really needs sleep; she requested SLP not wake Pt for PO trials. ST will continue efforts. Thank you,   Brogan England H. Romie Levee, CCC-SLP Speech Language Pathologist    [name] 08/31/2020, 4:40 PM

## 2020-08-31 NOTE — Progress Notes (Signed)
Patient seen and evaluated this a.m. with elevated blood pressures noted for which her home blood pressure medications have been resumed.  She was seen later in the day as well due to some anxiety issues with mother at bedside and has received Ativan with some relief.  She is still awaiting bed placement at [facility].  Please refer to discharge summary dictated on 5/15 for full details.  Total care time: 20 minutes.

## 2020-09-01 LAB — GLUCOSE, CAPILLARY
Glucose-Capillary: 127 mg/dL — ABNORMAL HIGH (ref 70–99)
Glucose-Capillary: 203 mg/dL — ABNORMAL HIGH (ref 70–99)
Glucose-Capillary: 155 mg/dL — ABNORMAL HIGH (ref 70–99)
Glucose-Capillary: 196 mg/dL — ABNORMAL HIGH (ref 70–99)

## 2020-09-01 MED ORDER — LIDOCAINE 5 % EX PTCH
1.0000 | MEDICATED_PATCH | CUTANEOUS | Status: DC
  Administered 2020-09-01 – 2020-09-05 (×4): 1 via TRANSDERMAL
  Filled 2020-09-01 (×7): qty 1

## 2020-09-01 NOTE — [place] FL2 (Signed)
[place] MEDICAID FL2 LEVEL OF CARE SCREENING TOOL     IDENTIFICATION  [name] Birthdate: May 24, 1965 Sex: female Admission Date (Current Location): 08/24/2020  [place] Number:  Aaron Edelman [id number] [facility] Facility and Address:  [facility],  [address] [place] [address]      Provider Number: [id number]  Attending Physician Name and Address:  Erick Blinks, DO  Relative Name and Phone Number:  Mitzi Davenport (Mother)   [phone]    Current Level of Care: Hospital Recommended Level of Care: Skilled Nursing Facility Prior Approval Number:    Date Approved/Denied:   PASRR Number:    Discharge Plan: SNF    Current Diagnoses: Patient Active Problem List   Diagnosis Date Noted  . Chronic combined systolic and diastolic congestive heart failure (HCC) 08/25/2020  . Acute CVA (cerebrovascular accident) (HCC) 08/24/2020  . Borderline prolonged QT interval 08/24/2020  . Type 2 diabetes mellitus with complication, without long-term current use of insulin (HCC) 03/16/2020  . Anasarca   . Diabetes mellitus (HCC)   . Hypokalemia   . Vitamin D deficiency   . Hypothyroidism   . Physical deconditioning   . Acute on chronic systolic CHF (congestive heart failure) (HCC) 03/09/2020  . Acute on chronic systolic HF (heart failure) (HCC) 03/09/2020  . Hyperglycemia 12/22/2018  . Hypertensive crisis 04/04/2018  . Acute CHF (congestive heart failure) (HCC) 04/04/2018  . Mild renal insufficiency 04/04/2018  . Elevated troponin 04/04/2018  . Bronchitis   . Obesity   . Seborrheic dermatitis of scalp 05/23/2016  . Essential hypertension 12/02/2015  . Depression 12/02/2015  . Primary insomnia 12/02/2015  . Morbid obesity with body mass index of 50.0-59.9 in adult (HCC) 12/02/2015  . Attention deficit hyperactivity disorder (ADHD), predominantly inattentive type 12/02/2015    Orientation RESPIRATION BLADDER Height & Weight      Self,Time,Situation,Place  Normal Incontinent,Continent Weight: 253 lb 8.5 oz (115 kg) Height:  5\' 4"  (162.6 cm)  BEHAVIORAL SYMPTOMS/MOOD NEUROLOGICAL BOWEL NUTRITION STATUS      Continent Diet (DYS 3)  AMBULATORY STATUS COMMUNICATION OF NEEDS Skin   Extensive Assist Verbally Normal                       Personal Care Assistance Level of Assistance  Bathing,Feeding,Dressing Bathing Assistance: Limited assistance Feeding assistance: Independent Dressing Assistance: Limited assistance     Functional Limitations Info  Sight,Hearing,Speech Sight Info: Adequate Hearing Info: Adequate Speech Info: Adequate    SPECIAL CARE FACTORS FREQUENCY  PT (By licensed PT),OT (By licensed OT),Speech therapy     PT Frequency: 5x//week OT Frequency: 3x/week     Speech Therapy Frequency: 3x/week      Contractures Contractures Info: Not present    Additional Factors Info  Code Status,Allergies,Psychotropic Code Status Info: Full Allergies Info: Hydroxyzine Psychotropic Info: Lexapro, Desyrel         Current Medications (09/01/2020):  This is the current hospital active medication list Current Facility-Administered Medications  Medication Dose Route Frequency Provider Last Rate Last Admin  . acetaminophen (TYLENOL) tablet 650 mg  650 mg Oral Q6H PRN 09/03/2020, MD   650 mg at 09/01/20 0300   Or  . acetaminophen (TYLENOL) suppository 650 mg  650 mg Rectal Q6H PRN 09/03/20, MD      . aspirin EC tablet 325 mg  325 mg Oral Once Bobette Mo, MD      . aspirin EC tablet 81 mg  81  mg Oral Q breakfast Mariea Clonts, Courage, MD   81 mg at 09/01/20 0906  . atorvastatin (LIPITOR) tablet 40 mg  40 mg Oral Daily Emokpae, Courage, MD   40 mg at 09/01/20 0907  . buPROPion (WELLBUTRIN XL) 24 hr tablet 150 mg  150 mg Oral Daily Sherryll Burger, Pratik D, DO   150 mg at 09/01/20 0906  . carvedilol (COREG) tablet 12.5 mg  12.5 mg Oral BID WC Sherryll Burger, Pratik D, DO   12.5 mg at 09/01/20 0907   . Chlorhexidine Gluconate Cloth 2 % PADS 6 each  6 each Topical Daily Shon Hale, MD   6 each at 09/01/20 0914  . clopidogrel (PLAVIX) tablet 75 mg  75 mg Oral Once Bobette Mo, MD      . clopidogrel (PLAVIX) tablet 75 mg  75 mg Oral Daily Shon Hale, MD   75 mg at 09/01/20 0907  . diphenhydrAMINE-zinc acetate (BENADRYL) 2-0.1 % cream   Topical BID PRN Sherryll Burger, Pratik D, DO      . enoxaparin (LOVENOX) injection 40 mg  40 mg Subcutaneous Q24H Bobette Mo, MD   40 mg at 09/01/20 0908  . escitalopram (LEXAPRO) tablet 20 mg  20 mg Oral Daily Sherryll Burger, Pratik D, DO   20 mg at 09/01/20 0907  . hydrALAZINE (APRESOLINE) tablet 25 mg  25 mg Oral Q8H Shah, Pratik D, DO   25 mg at 09/01/20 0603  . insulin aspart (novoLOG) injection 0-20 Units  0-20 Units Subcutaneous TID WC Bobette Mo, MD   7 Units at 09/01/20 1204  . insulin glargine (LANTUS) injection 6 Units  6 Units Subcutaneous QHS Shon Hale, MD   6 Units at 08/31/20 2235  . ketorolac (TORADOL) 30 MG/ML injection 30 mg  30 mg Intravenous Q6H PRN Sherryll Burger, Pratik D, DO   30 mg at 08/31/20 1158  . labetalol (NORMODYNE) injection 10 mg  10 mg Intravenous Q4H PRN Shon Hale, MD   10 mg at 08/31/20 0526  . levothyroxine (SYNTHROID) tablet 50 mcg  50 mcg Oral QAC breakfast Maurilio Lovely D, DO   50 mcg at 09/01/20 0603  . lidocaine (LIDODERM) 5 % 1 patch  1 patch Transdermal Q24H Sherryll Burger, Pratik D, DO   1 patch at 09/01/20 1204  . LORazepam (ATIVAN) injection 1 mg  1 mg Intravenous Q6H PRN Sherryll Burger, Pratik D, DO   1 mg at 08/31/20 1048  . losartan (COZAAR) tablet 50 mg  50 mg Oral Daily Sherryll Burger, Pratik D, DO   50 mg at 09/01/20 0907  . polyethylene glycol (MIRALAX / GLYCOLAX) packet 17 g  17 g Oral Daily Adefeso, Oladapo, DO   17 g at 09/01/20 0908  . sacubitril-valsartan (ENTRESTO) 49-51 mg per tablet  1 tablet Oral BID Maurilio Lovely D, DO   1 tablet at 09/01/20 0907  . sodium chloride (OCEAN) 0.65 % nasal spray 1 spray  1 spray  Each Nare PRN Shon Hale, MD   1 spray at 08/31/20 1549  . spironolactone (ALDACTONE) tablet 25 mg  25 mg Oral Daily Sherryll Burger, Pratik D, DO   25 mg at 09/01/20 0907  . torsemide (DEMADEX) tablet 40 mg  40 mg Oral BID Maurilio Lovely D, DO   40 mg at 09/01/20 0907     Discharge Medications: Please see discharge summary for a list of discharge medications.  Relevant Imaging Results:  Relevant Lab Results:   Additional Information SSN [id number], Juleen China, LCSW

## 2020-09-01 NOTE — Progress Notes (Signed)
At this time patient has pulled her IV access out. She states she has the right to not have it in. She is able to take meds p.o. I will talk with her again in the morning about allowing [place] to obtain IV access.

## 2020-09-01 NOTE — Progress Notes (Signed)
Physical Therapy Treatment Patient Details Name: Connie Shepard MRN: [id number] DOB: [birth year] Today's Date: 09/01/2020    History of Present Illness Dietrich Ke is a 55 y.o. female with medical history significant of chronic combined systolic and diastolic heart failure, elevated troponin, hypertension, ADD, history of bronchitis, depression, class III obesity who was brought to the emergency department due to right-sided weakness after having a fall at home.  She was last seen well the previous date at 1700.  Her family became concerned and called EMS.  She initially refused to come to the ED.  She has motor aphasia and is unable to provide further information at this time.    PT Comments    Patient presents up in chair (assisted by nursing staff via mechanical lift) and agreeable for therapy.  Patient able to keep trunk in midline without support at back for up to 3-4 minutes while assisted with completing BLE ROM exercises, some spastic movement noted in RLE during exercises, unable to lift RLE against gravity due to weakness, when standing able to support 20% of body weight with RLE and can drag RLE when taking side steps.  Patient took 3-4 slow labored side steps supported on right side while using RW with left hand.  Patient demonstrates fair/good return for using LUE and LLE for helping to reposition self and bridging to move over to the right, pull/push self up when put back to bed.  Patient will benefit from continued physical therapy in hospital and recommended venue below to increase strength, balance, endurance for safe ADLs and gait.   Follow Up Recommendations  [facility]IR     Equipment Recommendations  Other (comment) (to be determined)    Recommendations for Other Services       Precautions / Restrictions Precautions Precautions: Fall Precaution Comments: R side hemiparesis Restrictions Weight Bearing Restrictions: No    Mobility  Bed Mobility Overal bed mobility: Needs  Assistance Bed Mobility: Sit to Supine       Sit to supine: Mod assist   General bed mobility comments: requires Mod assist to move RLE back onto bed, tends to fall over to the right when not supporting self with LUE    Transfers Overall transfer level: Needs assistance Equipment used: Rolling walker (2 wheeled) Transfers: Sit to/from [employer] Sit to Stand: Mod assist Stand pivot transfers: Mod assist;Max assist       General transfer comment: unable to keep right hand on RW due to weakness, required right knee blocked for safety when standing  Ambulation/Gait Ambulation/Gait assistance: Mod assist;Max assist Gait Distance (Feet): 4 Feet Assistive device: Rolling walker (2 wheeled);1 person hand held assist Gait Pattern/deviations: Decreased step length - right;Decreased step length - left;Decreased stance time - right;Decreased stride length;Shuffle Gait velocity: slow   General Gait Details: able to complete 3-4 slow labored side steps with tactile cueing to move RLE, able to bear 20% of body weight on right side when standing   Stairs             Wheelchair Mobility    Modified Rankin (Stroke Patients Only)       Balance Overall balance assessment: Needs assistance Sitting-balance support: Feet supported;Single extremity supported Sitting balance-Leahy Scale: Fair Sitting balance - Comments: fair/poor with frequent leaning to the right Postural control: Right lateral lean Standing balance support: During functional activity;Single extremity supported Standing balance-Leahy Scale: Poor Standing balance comment: holding on RW with left hand while supported on the right side  Cognition Arousal/Alertness: Awake/alert Behavior During Therapy: WFL for tasks assessed/performed Overall Cognitive Status: Within Functional Limits for tasks assessed                                         Exercises General Exercises - Lower Extremity Long Arc Quad: Seated;AAROM;Strengthening;Both;AROM;15 reps Hip Flexion/Marching: Seated;AAROM;Strengthening;15 reps;AROM;Both    General Comments        Pertinent Vitals/Pain Pain Assessment: Faces Faces Pain Scale: Hurts a little bit Pain Location: low back Pain Descriptors / Indicators: Sore Pain Intervention(s): Limited activity within patient's tolerance;Monitored during session;Premedicated before session    Home Living                      Prior Function            PT Goals (current goals can now be found in the care plan section) Acute Rehab PT Goals Patient Stated Goal: return home after rehab PT Goal Formulation: With patient Time For Goal Achievement: 09/08/20 Potential to Achieve Goals: Good Progress towards PT goals: Progressing toward goals    Frequency    Min 5X/week      PT Plan Current plan remains appropriate    Co-evaluation              AM-PAC PT "6 Clicks" Mobility   Outcome Measure  Help needed turning from your back to your side while in a flat bed without using bedrails?: A Lot Help needed moving from lying on your back to sitting on the side of a flat bed without using bedrails?: A Lot Help needed moving to and from a bed to a chair (including a wheelchair)?: A Lot Help needed standing up from a chair using your arms (e.g., wheelchair or bedside chair)?: A Lot Help needed to walk in hospital room?: Total Help needed climbing 3-5 steps with a railing? : Total 6 Click Score: 10    End of Session   Activity Tolerance: Patient tolerated treatment well;Patient limited by fatigue Patient left: in bed;with call bell/phone within reach;with bed alarm set;with family/visitor present Nurse Communication: Mobility status PT Visit Diagnosis: Unsteadiness on feet (R26.81);Other abnormalities of gait and mobility (R26.89);Muscle weakness (generalized) (M62.81)     Time: 2130-8657 PT  Time Calculation (min) (ACUTE ONLY): 28 min  Charges:  $Therapeutic Exercise: 8-22 mins $Therapeutic Activity: 8-22 mins                     3:12 PM, 09/01/20 Ocie Bob, MPT Physical Therapist with [facility] [phone] office [phone] mobile phone

## 2020-09-01 NOTE — Progress Notes (Signed)
Patient seen and evaluated this morning with still some ongoing elevated blood pressure readings.  She was noted to have some ongoing low back pain for which Lidoderm patch has been prescribed to assist with pain management.  TOC now assisting with placement to SNF.  Total care time: 20 minutes.

## 2020-09-02 LAB — GLUCOSE, CAPILLARY
Glucose-Capillary: 274 mg/dL — ABNORMAL HIGH (ref 70–99)
Glucose-Capillary: 267 mg/dL — ABNORMAL HIGH (ref 70–99)
Glucose-Capillary: 109 mg/dL — ABNORMAL HIGH (ref 70–99)
Glucose-Capillary: 209 mg/dL — ABNORMAL HIGH (ref 70–99)

## 2020-09-02 MED ORDER — GABAPENTIN 100 MG PO CAPS
100.0000 mg | ORAL_CAPSULE | Freq: Three times a day (TID) | ORAL | Status: DC
  Administered 2020-09-02 – 2020-09-06 (×14): 100 mg via ORAL
  Filled 2020-09-02 (×15): qty 1

## 2020-09-02 MED ORDER — MUSCLE RUB 10-15 % EX CREA
TOPICAL_CREAM | CUTANEOUS | Status: DC | PRN
  Filled 2020-09-02: qty 85

## 2020-09-02 MED ORDER — HYDRALAZINE HCL 25 MG PO TABS
50.0000 mg | ORAL_TABLET | Freq: Three times a day (TID) | ORAL | Status: DC
  Administered 2020-09-02 – 2020-09-07 (×16): 50 mg via ORAL
  Filled 2020-09-02 (×16): qty 2

## 2020-09-02 MED ORDER — DICLOFENAC SODIUM 1 % EX GEL
2.0000 g | Freq: Four times a day (QID) | CUTANEOUS | Status: DC
  Administered 2020-09-02 – 2020-09-06 (×7): 2 g via TOPICAL
  Filled 2020-09-02 (×2): qty 100

## 2020-09-02 NOTE — H&P (Incomplete)
Physical Medicine and Rehabilitation Admission H&P    Chief Complaint  Patient presents with  . Altered Mental Status  : HPI: Connie Shepard is a 55 year old right-handed female with history of diabetes mellitus, chronic combined systolic and diastolic congestive heart failure as well as elevated troponin, hypertension, ADD, bronchitis, depression, obesity.  Per chart review patient lives with mother.  Independent with assistive device.  Multilevel home bed and bath on main level 3 steps to entry.  Presented 08/24/2020 to [facility] with acute onset of right side weakness as well as slurred speech with facial droop.  Cranial CT scan showed areas of hypoattenuation in the subcortical matter of the bilateral frontal lobes and right occipital lobe representing asymmetric microvascular ischemic changes or other white matter disease.  No evidence of acute intracranial hemorrhage.  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  Additional small subacute lacunar type infarct involving the left periatrial white matter.  Motion degraded MRA of the head possible stenosis of the proximal right PCA.  Patient did not receive tPA.  Carotid ultrasound no significant stenosis internal carotid arteries.  Echocardiogram with ejection fraction approximately 20%.  Left ventricle severely decreased function with global hypokinesis.  Admission chemistries unremarkable except glucose 207, total CK 49, urine drug screen negative, hemoglobin A1c 9.8.  Currently maintained on aspirin as well as Plavix for CVA prophylaxis.  Subcutaneous Lovenox for DVT prophylaxis.  Tolerating a mechanical soft diet.  Therapy evaluations completed due to patient's right side weakness and dysarthric speech was admitted for a comprehensive rehab program  Review of Systems  Constitutional: Negative for chills and fever.  HENT: Negative for hearing loss.   Eyes: Negative for blurred vision  and double vision.  Respiratory: Negative for cough.   Cardiovascular: Positive for leg swelling. Negative for palpitations.  Gastrointestinal: Positive for constipation. Negative for heartburn, nausea and vomiting.  Genitourinary: Negative for dysuria, flank pain and hematuria.  Musculoskeletal: Positive for myalgias.  Skin: Negative for rash.  Neurological: Positive for speech change and weakness.  Psychiatric/Behavioral: Positive for depression. The patient has insomnia.        Anxiety  All other systems reviewed and are negative.  Past Medical History:  Diagnosis Date  . Acute heart failure (HCC)   . ADD (attention deficit disorder)   . Bronchitis   . Depression   . Diabetes mellitus without complication (HCC)   . Elevated troponin   . Hypertension   . Obesity    Past Surgical History:  Procedure Laterality Date  . CESAREAN SECTION     Family History  Problem Relation Age of Onset  . Kidney Stones Mother   . Hypertension Mother   . Heart disease Father    Social History:  reports that she has never smoked. She has never used smokeless tobacco. She reports that she does not drink alcohol and does not use drugs. Allergies:  Allergies  Allergen Reactions  . Hydroxyzine     Bad dreams   Medications Prior to Admission  Medication Sig Dispense Refill  . buPROPion (WELLBUTRIN XL) 150 MG 24 hr tablet Take 1 tablet (150 mg total) by mouth daily. 30 tablet 5  . carvedilol (COREG) 12.5 MG tablet Take 1 tablet (12.5 mg total) by mouth 2 (two) times daily with a meal. 60 tablet 2  . escitalopram (LEXAPRO) 20 MG tablet Take 1 tablet (20 mg total) by mouth daily. 30 tablet 5  . hydrALAZINE (APRESOLINE) 50  MG tablet TAKE 1/2 TABLET 3 TIMES DAILY 90 tablet 5  . levothyroxine (SYNTHROID) 50 MCG tablet Take 1 tablet (50 mcg total) by mouth daily before breakfast. 30 tablet 2  . losartan (COZAAR) 50 MG tablet Take 1 tablet by mouth daily.    . metFORMIN (GLUCOPHAGE) 500 MG tablet Take  1 tablet (500 mg total) by mouth daily with breakfast. 90 tablet 3  . potassium chloride SA (KLOR-CON) 20 MEQ tablet Take 1 tablet (20 mEq total) by mouth 2 (two) times daily. 60 tablet 0  . sacubitril-valsartan (ENTRESTO) 49-51 MG Take 1 tablet by mouth 2 (two) times daily. 60 tablet 6  . spironolactone (ALDACTONE) 25 MG tablet Take 1 tablet (25 mg total) by mouth daily. 90 tablet 3  . torsemide (DEMADEX) 20 MG tablet Take 2 tablets (40 mg total) by mouth 2 (two) times daily. 120 tablet 2  . zolpidem (AMBIEN) 5 MG tablet Take 5 mg by mouth at bedtime.    . traZODone (DESYREL) 50 MG tablet Take 1 tablet by mouth at bedtime as needed. (Patient not taking: Reported on 08/24/2020)    . Vitamin D, Ergocalciferol, (DRISDOL) 1.25 MG (50000 UNIT) CAPS capsule Take 1 capsule (50,000 Units total) by mouth every 7 (seven) days. 8 capsule 1    Drug Regimen Review Drug regimen was reviewed and remains appropriate with no significant issues identified  Home: Home Living Family/patient expects to be discharged to:: Private residence Living Arrangements: Parent Available Help at Discharge: Family,Available 24 hours/day Type of Home: House Home Access: Stairs to enter [employer] of Steps: 3 Entrance Stairs-Rails: None Home Layout: Two level,Able to live on main level with bedroom/bathroom Alternate Level Stairs-Number of Steps: 12 Alternate Level Stairs-Rails: Left Bathroom Shower/Tub: Engineer, manufacturing systems: Handicapped height Bathroom Accessibility: Yes Home Equipment: Shower seat,Bedside commode,Cane - single point,Walker - 2 wheels,Grab bars - tub/shower,Wheelchair - manual Additional Comments: at baseline does not use assistive device  Lives With: Family   Functional History: Prior Function Level of Independence: Independent,Independent with assistive device(s) Gait / Transfers Assistance Needed: Pt reports she has hand held assist by mother when ambulating at  times. ADL's / Homemaking Assistance Needed: Reportedly independent for ADL's and IADL's ; pt was driving Comments: household and short distanced community ambulator with family member providing hand held assistance  Functional Status:  Mobility: Bed Mobility Overal bed mobility: Needs Assistance Bed Mobility: Sit to Supine Supine to sit: Mod assist,Max assist Sit to supine: Mod assist General bed mobility comments: requires Mod assist to move RLE back onto bed, tends to fall over to the right when not supporting self with LUE Transfers Overall transfer level: Needs assistance Equipment used: Rolling walker (2 wheeled) Transfers: Sit to/from [employer] Sit to Stand: Mod assist Stand pivot transfers: Mod assist,Max assist General transfer comment: unable to keep right hand on RW due to weakness, required right knee blocked for safety when standing Ambulation/Gait Ambulation/Gait assistance: Mod assist,Max assist Gait Distance (Feet): 4 Feet Assistive device: Rolling walker (2 wheeled),1 person hand held assist Gait Pattern/deviations: Decreased step length - right,Decreased step length - left,Decreased stance time - right,Decreased stride length,Shuffle General Gait Details: able to complete 3-4 slow labored side steps with tactile cueing to move RLE, able to bear 20% of body weight on right side when standing Gait velocity: slow    ADL: ADL Overall ADL's : Needs assistance/impaired Eating/Feeding: Set up,Minimal assistance,Sitting,Bed level Grooming: Wash/dry hands,Wash/dry face,Oral care,Moderate assistance,Sitting,Applying deodorant,Brushing hair,Bed level Upper Body Bathing: Moderate  assistance,Sitting Lower Body Bathing: Bed level,Maximal assistance Upper Body Dressing : Moderate assistance,Sitting Lower Body Dressing: Total assistance,Bed level Lower Body Dressing Details (indicate cue type and reason): total assist to don socks at bed level Toilet  Transfer: Maximal assistance,Stand-pivot,Moderate assistance Toilet Transfer Details (indicate cue type and reason): simulated via EOB to chair transfer Toileting- Clothing Manipulation and Hygiene: Total assistance,Bed level Tub/ Shower Transfer: Maximal assistance,Total assistance,Tub bench Functional mobility during ADLs: Maximal assistance General ADL Comments: Clinical judgement used for all but lower body dressing which was actually observed.  Cognition: Cognition Overall Cognitive Status: Within Functional Limits for tasks assessed Arousal/Alertness: Awake/alert Orientation Level: Oriented to person,Oriented to place,Oriented to time Attention: Sustained Sustained Attention:  (missed one month when saying in reverse) Memory: Appears intact Awareness: Impaired Awareness Impairment: Anticipatory impairment Problem Solving: Appears intact Safety/Judgment: Appears intact Cognition Arousal/Alertness: Awake/alert Behavior During Therapy: WFL for tasks assessed/performed Overall Cognitive Status: Within Functional Limits for tasks assessed General Comments: oriented x3  Physical Exam: Blood pressure (!) 136/106, pulse 77, temperature 97.8 F (36.6 C), temperature source Oral, resp. rate 19, height 5\' 4"  (1.626 m), weight 115 kg, SpO2 90 %. Physical Exam Neurological:     Comments: Patient was alert in no acute distress.  Mild to moderate dysarthria.  Oriented to person and place.  She did display some decrease in attention.  Follows simple commands.     Results for orders placed or performed during the hospital encounter of 08/24/20 (from the past 48 hour(s))  Glucose, capillary     Status: Abnormal   Collection Time: 08/31/20  7:29 AM  Result Value Ref Range   Glucose-Capillary 138 (H) 70 - 99 mg/dL    Comment: Glucose reference range applies only to samples taken after fasting for at least 8 hours.  Glucose, capillary     Status: Abnormal   Collection Time: 08/31/20 11:09  AM  Result Value Ref Range   Glucose-Capillary 176 (H) 70 - 99 mg/dL    Comment: Glucose reference range applies only to samples taken after fasting for at least 8 hours.  Glucose, capillary     Status: Abnormal   Collection Time: 08/31/20  4:05 PM  Result Value Ref Range   Glucose-Capillary 162 (H) 70 - 99 mg/dL    Comment: Glucose reference range applies only to samples taken after fasting for at least 8 hours.   Comment 1 Notify RN    Comment 2 Document in Chart   Glucose, capillary     Status: Abnormal   Collection Time: 08/31/20 10:20 PM  Result Value Ref Range   Glucose-Capillary 140 (H) 70 - 99 mg/dL    Comment: Glucose reference range applies only to samples taken after fasting for at least 8 hours.  Glucose, capillary     Status: Abnormal   Collection Time: 09/01/20  7:46 AM  Result Value Ref Range   Glucose-Capillary 127 (H) 70 - 99 mg/dL    Comment: Glucose reference range applies only to samples taken after fasting for at least 8 hours.  Glucose, capillary     Status: Abnormal   Collection Time: 09/01/20 11:00 AM  Result Value Ref Range   Glucose-Capillary 203 (H) 70 - 99 mg/dL    Comment: Glucose reference range applies only to samples taken after fasting for at least 8 hours.  Glucose, capillary     Status: Abnormal   Collection Time: 09/01/20  4:32 PM  Result Value Ref Range   Glucose-Capillary 155 (H) 70 - 99  mg/dL    Comment: Glucose reference range applies only to samples taken after fasting for at least 8 hours.  Glucose, capillary     Status: Abnormal   Collection Time: 09/01/20  9:01 PM  Result Value Ref Range   Glucose-Capillary 196 (H) 70 - 99 mg/dL    Comment: Glucose reference range applies only to samples taken after fasting for at least 8 hours.   No results found.     Medical Problem List and Plan: 1.  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  Antithrombotics: -DVT/anticoagulation: Lovenox  -antiplatelet therapy: Aspirin 81 mg daily and Plavix 75 mg daily 3. Pain Management: Lidoderm patch for back pain 4. Mood/ADD: Lexapro 20 mg daily, Wellbutrin 150 mg daily  -antipsychotic agents: N/A 5. Neuropsych: This patient is capable of making decisions on her own behalf. 6. Skin/Wound Care: Routine skin checks 7. Fluids/Electrolytes/Nutrition: Routine in and outs with follow-up chemistries 8.  Chronic combined diastolic/systolic congestive heart failure.  Continue Entresto 49-51 mg twice daily, Aldactone 25 mg daily, Demadex 40 mg twice daily.  Monitor for any signs of fluid overload 9.  Hypertension.  Cozaar 50 mg daily, hydralazine 25 mg every 8 hours, Coreg 12.5 mg twice daily.  Monitor with increased mobility 10.  Hyperlipidemia.  Lipitor 11.  Diabetes mellitus.  Hemoglobin A1c 9.8.  Currently on Lantus insulin 6 units nightly.  Patient had been on Glucophage alone 500 mg daily prior to admission.  Check blood sugars before meals and at bedtime 12.  Hypothyroidism.  Synthroid 13.  Obesity.  Dietary follow-up  ***  Charlton AmorDaniel J Abimael Zeiter, PA-C 09/02/2020

## 2020-09-02 NOTE — Progress Notes (Signed)
Physical Therapy Treatment Patient Details Name: Connie Shepard MRN: [id number] DOB: 1966-02-15 Today's Date: 09/02/2020    History of Present Illness Connie Shepard is a 55 y.o. female with medical history significant of chronic combined systolic and diastolic heart failure, elevated troponin, hypertension, ADD, history of bronchitis, depression, class III obesity who was brought to the emergency department due to right-sided weakness after having a fall at home.  She was last seen well the previous date at 1700.  Her family became concerned and called EMS.  She initially refused to come to the ED.  She has motor aphasia and is unable to provide further information at this time.    PT Comments    Patient demonstrates fair/good return for transferring to [facility] with right side supported, able to stand using LUE on RW and supported on right side for up to 2-3 minutes while being cleaned by NT, tolerated sitting up at bedside for approximately 20 minutes while completing trunk stabilization/core exercises leaning forward/backwards, left/right up to 10 times each with arms crossed without loss of balance, and required frequent rest breaks by leaning on LLE.  Patient able to take a few side steps with mostly dragging of RLE during bed/chair transfers and sat up in chair for approximately 1 hour before requesting to go back to bed.   Patient will benefit from continued physical therapy in hospital and recommended venue below to increase strength, balance, endurance for safe ADLs and gait.   Follow Up Recommendations  CIR     Equipment Recommendations  Other (comment) (to be determined)    Recommendations for Other Services       Precautions / Restrictions Precautions Precautions: Fall Precaution Comments: R side hemiparesis Restrictions Weight Bearing Restrictions: No    Mobility  Bed Mobility Overal bed mobility: Needs Assistance Bed Mobility: Supine to Sit;Sit to Supine     Supine to sit:  Mod assist;Max assist Sit to supine: Mod assist   General bed mobility comments: has diffiuclty mostly due to right sided weakness    Transfers Overall transfer level: Needs assistance Equipment used: Rolling walker (2 wheeled) Transfers: Sit to/from [employer] Sit to Stand: Mod assist Stand pivot transfers: Mod assist       General transfer comment: Patient able to transfer to commode with improvement balance for stand pivot supported on the right side  Ambulation/Gait Ambulation/Gait assistance: Mod assist;Max assist Gait Distance (Feet): 4 Feet Assistive device: Rolling walker (2 wheeled);1 person hand held assist Gait Pattern/deviations: Decreased step length - right;Decreased step length - left;Decreased stance time - right;Decreased stride length;Shuffle Gait velocity: slow   General Gait Details: able to complete 3-4 slow labored side steps with tactile cueing to move RLE, able to bear 30-40% of body weight on right side when standing   Stairs             Wheelchair Mobility    Modified Rankin (Stroke Patients Only)       Balance Overall balance assessment: Needs assistance Sitting-balance support: Feet supported;No upper extremity supported Sitting balance-Leahy Scale: Fair Sitting balance - Comments: able to keep trunk in midline while completing BLE exercises seated at bedside Postural control: Right lateral lean Standing balance support: During functional activity;Single extremity supported Standing balance-Leahy Scale: Poor Standing balance comment: holding on RW with left hand while supported on the right side  Cognition Arousal/Alertness: Awake/alert Behavior During Therapy: WFL for tasks assessed/performed Overall Cognitive Status: Within Functional Limits for tasks assessed                                        Exercises General Exercises - Lower Extremity Long Arc  Quad: Seated;AAROM;Strengthening;Both;AROM;10 reps Hip Flexion/Marching: Seated;AAROM;Strengthening;AROM;Both;10 reps Heel Raises: Seated;AROM;Strengthening;10 reps;Both    General Comments        Pertinent Vitals/Pain Pain Assessment: Faces Faces Pain Scale: Hurts a little bit Pain Location: low back Pain Descriptors / Indicators: Sore Pain Intervention(s): Limited activity within patient's tolerance;Monitored during session;Repositioned    Home Living                      Prior Function            PT Goals (current goals can now be found in the care plan section) Acute Rehab PT Goals Patient Stated Goal: return home after rehab PT Goal Formulation: With patient Time For Goal Achievement: 09/08/20 Potential to Achieve Goals: Good Progress towards PT goals: Progressing toward goals    Frequency    Min 5X/week      PT Plan Current plan remains appropriate    Co-evaluation              AM-PAC PT "6 Clicks" Mobility   Outcome Measure  Help needed turning from your back to your side while in a flat bed without using bedrails?: A Lot Help needed moving from lying on your back to sitting on the side of a flat bed without using bedrails?: A Lot Help needed moving to and from a bed to a chair (including a wheelchair)?: A Lot Help needed standing up from a chair using your arms (e.g., wheelchair or bedside chair)?: A Lot Help needed to walk in hospital room?: Total Help needed climbing 3-5 steps with a railing? : Total 6 Click Score: 10    End of Session   Activity Tolerance: Patient tolerated treatment well;Patient limited by fatigue Patient left: with call bell/phone within reach;with family/visitor present;in chair;with chair alarm set Nurse Communication: Mobility status PT Visit Diagnosis: Unsteadiness on feet (R26.81);Other abnormalities of gait and mobility (R26.89);Muscle weakness (generalized) (M62.81)     Time: 1010-1053 PT Time  Calculation (min) (ACUTE ONLY): 43 min  Charges:  $Therapeutic Exercise: 8-22 mins $Therapeutic Activity: 23-37 mins                     2:28 PM, 09/02/20 Ocie Bob, MPT Physical Therapist with [facility] [phone] office [phone] mobile phone

## 2020-09-02 NOTE — Progress Notes (Signed)
Pt pulled out 22g IV from Left hand. She stated she "woke up and it was out".

## 2020-09-02 NOTE — TOC Progression Note (Signed)
Transition of Care [facility]) - Progression Note    Patient Details  Name: Connie Shepard MRN: [id number] Date of Birth: 1966-02-10  [facility] CM/SW Contact  Barry Brunner, LCSW Phone Number: 09/02/2020, 5:42 PM  Clinical Narrative:    CSW confirmed with CIR that authorization request was canceled. Revonda Standard with [employer] agreaable to start auth for SNF TOC to follow.   Expected Discharge Plan: (P) Skilled Nursing Facility Barriers to Discharge: (P) Continued Medical Work up  Expected Discharge Plan and Services Expected Discharge Plan: (P) Skilled Nursing Facility         Expected Discharge Date: 08/29/20                                     Social Determinants of Health (SDOH) Interventions    Readmission Risk Interventions No flowsheet data found.

## 2020-09-02 NOTE — Progress Notes (Signed)
Inpatient Rehab Admissions Coordinator:   Late entry:  Spoke to pt's daughter, Sofie Hartigan.  She has spoken to family and they prefer SNF for prolonged rehab course.  I let Tretha Sciara, LCSW, and Dr. Sherryll Burger know.   Estill Dooms, PT, DPT Admissions Coordinator [phone] 09/02/20  9:40 AM

## 2020-09-02 NOTE — Progress Notes (Signed)
Patient seen and evaluated this morning with ongoing elevated blood pressure readings noted.  Her hydralazine dose will be increased to 50 mg 3 times daily moving forward.  TOC assisting with disposition with potential placement to [facility] in the next 1-2 days.  Total care time: 15 minutes.

## 2020-09-02 NOTE — Progress Notes (Signed)
  Speech Language Pathology Treatment: Dysphagia  Patient Details Name: Connie Shepard MRN: [id number] DOB: [birth year] Today's Date: 09/02/2020 Time: 1856-3149 SLP Time Calculation (min) (ACUTE ONLY): 16 min  Assessment / Plan / Recommendation Clinical Impression  Ongoing diagnostic dysphagia therapy provided with limited trials of thin liquids; Pt continues to demonstrate impulsive drinking, taking multiple consecutive sips of thin liquids followed by a delayed throat clear. SLP reiterated the importance of Pt taking single sips and swallowing "hard and fast". When following this recommendation, no delayed throat clearing or wet vocal quality was noted. Trials were limited to thin liquids secondary to Pt be "ready to get in bed". Pt's mother, present for treatment reports Pt seems to be tolerating her meals well. SLP educated mom and Pt on strategies recommended by recent MBS -ST will continue to follow acutely.   HPI HPI: Connie Shepard is a 55 y.o. female with medical history significant of chronic combined systolic and diastolic heart failure, elevated troponin, hypertension, ADD, history of bronchitis, depression, class III obesity who was brought to the emergency department due to right-sided weakness after having a fall at home.  She was last seen well the previous date at 1700.  Her family became concerned and called EMS.  She initially refused to come to the ED.  She has motor aphasia and is unable to provide further information at this time.SLE requested. 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 Additional small subacute lacunar type infarct  involving the left periatrial white matter.      SLP Plan   ST will continue to follow acutely       Recommendations  Diet recommendations: Dysphagia 3 (mechanical soft);Thin liquid Liquids provided via: Cup;Straw Medication Administration: Whole meds with liquid Supervision: Staff to assist  with self feeding;Full supervision/cueing for compensatory strategies Compensations: Slow rate;Small sips/bites Postural Changes and/or Swallow Maneuvers: Out of bed for meals;Seated upright 90 degrees;Upright 30-60 min after meal     Connie Shepard H. Romie Levee, CCC-SLP Speech Language Pathologist       [name] 09/02/2020, 12:18 PM

## 2020-09-03 LAB — MAGNESIUM: Magnesium: 1.7 mg/dL (ref 1.7–2.4)

## 2020-09-03 LAB — BASIC METABOLIC PANEL
Anion gap: 11 (ref 5–15)
BUN: 32 mg/dL — ABNORMAL HIGH (ref 6–20)
CO2: 28 mmol/L (ref 22–32)
Calcium: 9.5 mg/dL (ref 8.9–10.3)
Chloride: 100 mmol/L (ref 98–111)
Creatinine, Ser: 1.5 mg/dL — ABNORMAL HIGH (ref 0.44–1.00)
GFR, Estimated: 41 mL/min — ABNORMAL LOW (ref >60)
Glucose, Bld: 168 mg/dL — ABNORMAL HIGH (ref 70–99)
Potassium: 3.3 mmol/L — ABNORMAL LOW (ref 3.5–5.1)
Sodium: 139 mmol/L (ref 135–145)

## 2020-09-03 LAB — GLUCOSE, CAPILLARY
Glucose-Capillary: 158 mg/dL — ABNORMAL HIGH (ref 70–99)
Glucose-Capillary: 266 mg/dL — ABNORMAL HIGH (ref 70–99)
Glucose-Capillary: 181 mg/dL — ABNORMAL HIGH (ref 70–99)
Glucose-Capillary: 183 mg/dL — ABNORMAL HIGH (ref 70–99)

## 2020-09-03 MED ORDER — LACTATED RINGERS IV SOLN: INTRAVENOUS | Status: AC

## 2020-09-03 MED ORDER — POTASSIUM CHLORIDE CRYS ER 20 MEQ PO TBCR
40.0000 meq | EXTENDED_RELEASE_TABLET | Freq: Once | ORAL | Status: AC
  Administered 2020-09-03: 40 meq via ORAL
  Filled 2020-09-03: qty 2

## 2020-09-03 NOTE — Progress Notes (Signed)
Patient seen and evaluated this AM.  Her BMP was repeated today with noted elevation in creatinine levels to 1.5 from baseline of approximately 1.1.  Plan will be to discontinue nephrotoxic medications today and place on gentle IV fluid with repeat pending in a.m. TOC assisting with disposition with potential placement to [facility] in the next 24 hours.  Total care time: 25 minutes.

## 2020-09-03 NOTE — TOC Progression Note (Signed)
Transition of Care [facility]) - Progression Note    Patient Details  Name: Connie Shepard MRN: [id number] Date of Birth: [birth year]  Transition of Care [facility]) CM/SW Contact  Barry Brunner, LCSW Phone Number: 09/03/2020, 4:01 PM  Clinical Narrative:    CSW spoke with Revonda Standard of [facility] to inquire if patient had received authorization. Revonda Standard reported that they had not yet received auth. MD requested that CSW confirm with patient that patient is agreeable to [facility]. CSW spoke with patient and notified her of the barriers with [facility]. CSW inquired if patient is agreeable to [facility]. Patient agreeable to [facility]. TOC to follow.   Expected Discharge Plan: (P) Skilled Nursing Facility Barriers to Discharge: (P) Continued Medical Work up  Expected Discharge Plan and Services Expected Discharge Plan: (P) Skilled Nursing Facility         Expected Discharge Date: 08/29/20                                     Social Determinants of Health (SDOH) Interventions    Readmission Risk Interventions No flowsheet data found.

## 2020-09-03 NOTE — Plan of Care (Signed)
  Problem: Education: Goal: Knowledge of disease or condition will improve Outcome: Progressing Goal: Knowledge of secondary prevention will improve Outcome: Progressing   Problem: Coping: Goal: Will verbalize positive feelings about self Outcome: Progressing Goal: Will identify appropriate support needs Outcome: Progressing   Problem: Health Behavior/Discharge Planning: Goal: Ability to manage health-related needs will improve Outcome: Progressing   Problem: Self-Care: Goal: Ability to participate in self-care as condition permits will improve Outcome: Progressing Goal: Verbalization of feelings and concerns over difficulty with self-care will improve Outcome: Progressing Goal: Ability to communicate needs accurately will improve Outcome: Progressing   Problem: Nutrition: Goal: Risk of aspiration will decrease Outcome: Progressing Goal: Dietary intake will improve Outcome: Progressing   Problem: Intracerebral Hemorrhage Tissue Perfusion: Goal: Complications of Intracerebral Hemorrhage will be minimized Outcome: Progressing   Problem: Ischemic Stroke/TIA Tissue Perfusion: Goal: Complications of ischemic stroke/TIA will be minimized Outcome: Progressing   Problem: Spontaneous Subarachnoid Hemorrhage Tissue Perfusion: Goal: Complications of Spontaneous Subarachnoid Hemorrhage will be minimized Outcome: Progressing   Problem: Education: Goal: Knowledge of General Education information will improve Description: Including pain rating scale, medication(s)/side effects and non-pharmacologic comfort measures Outcome: Progressing   Problem: Health Behavior/Discharge Planning: Goal: Ability to manage health-related needs will improve Outcome: Progressing   Problem: Clinical Measurements: Goal: Ability to maintain clinical measurements within normal limits will improve Outcome: Progressing Goal: Will remain free from infection Outcome: Progressing Goal: Diagnostic test  results will improve Outcome: Progressing Goal: Respiratory complications will improve Outcome: Progressing Goal: Cardiovascular complication will be avoided Outcome: Progressing   Problem: Activity: Goal: Risk for activity intolerance will decrease Outcome: Progressing   Problem: Nutrition: Goal: Adequate nutrition will be maintained Outcome: Progressing   Problem: Coping: Goal: Level of anxiety will decrease Outcome: Progressing   Problem: Elimination: Goal: Will not experience complications related to bowel motility Outcome: Progressing Goal: Will not experience complications related to urinary retention Outcome: Progressing   Problem: Pain Managment: Goal: General experience of comfort will improve Outcome: Progressing   Problem: Safety: Goal: Ability to remain free from injury will improve Outcome: Progressing   Problem: Skin Integrity: Goal: Risk for impaired skin integrity will decrease Outcome: Progressing   

## 2020-09-04 LAB — BASIC METABOLIC PANEL
Anion gap: 10 (ref 5–15)
BUN: 29 mg/dL — ABNORMAL HIGH (ref 6–20)
CO2: 28 mmol/L (ref 22–32)
Calcium: 9.4 mg/dL (ref 8.9–10.3)
Chloride: 102 mmol/L (ref 98–111)
Creatinine, Ser: 1.27 mg/dL — ABNORMAL HIGH (ref 0.44–1.00)
GFR, Estimated: 51 mL/min — ABNORMAL LOW (ref >60)
Glucose, Bld: 154 mg/dL — ABNORMAL HIGH (ref 70–99)
Potassium: 3.6 mmol/L (ref 3.5–5.1)
Sodium: 140 mmol/L (ref 135–145)

## 2020-09-04 LAB — GLUCOSE, CAPILLARY
Glucose-Capillary: 164 mg/dL — ABNORMAL HIGH (ref 70–99)
Glucose-Capillary: 214 mg/dL — ABNORMAL HIGH (ref 70–99)
Glucose-Capillary: 159 mg/dL — ABNORMAL HIGH (ref 70–99)
Glucose-Capillary: 209 mg/dL — ABNORMAL HIGH (ref 70–99)

## 2020-09-04 NOTE — TOC Progression Note (Signed)
Transition of Care [facility]) - Progression Note    Patient Details  Name: Connie Shepard MRN: [id number] Date of Birth: [birth year]  Transition of Care [facility]) CM/SW Contact  Barry Brunner, LCSW Phone Number: 09/04/2020, 4:12 PM  Clinical Narrative:    CSW notified of patient's mother's request to speak to [facility] about placement. Pt's mother inquired about other options for Pt to receive PT. CSW notified patient's mother of copay cost for HHPT and explained the process for obtaining a CAP aid. Patient and patient's mother agreeable to a medicaid referral. CSW referred patient to [facility]. CSW informed patient and patient's mother that [facility] is the only bed offer. CSW also explained that [facility] did not have any bed availability and insurance auth to revisit [facility] would greatly increase time for discharge. [name] reported that she will speak with continue conversation with patient. TOC to follow.   Expected Discharge Plan: (P) Skilled Nursing Facility Barriers to Discharge: (P) Continued Medical Work up  Expected Discharge Plan and Services Expected Discharge Plan: (P) Skilled Nursing Facility         Expected Discharge Date: 08/29/20                                     Social Determinants of Health (SDOH) Interventions    Readmission Risk Interventions No flowsheet data found.

## 2020-09-04 NOTE — Progress Notes (Signed)
Patient seen and evaluated this morning and is not agreeable to going to [facility] for inpatient rehabilitation.  Mother is at bedside and is attempting to convince her to go as she remains a moderate to max assist and is unsafe for discharge to home and does not have enough resources for care and rehabilitation at home.  Creatinine levels are starting to improve back towards baseline. Recheck BMP in AM.  Please refer to discharge summary dictated on 5/15 for full details.  Total care time: 30 minutes.

## 2020-09-05 LAB — GLUCOSE, CAPILLARY
Glucose-Capillary: 151 mg/dL — ABNORMAL HIGH (ref 70–99)
Glucose-Capillary: 159 mg/dL — ABNORMAL HIGH (ref 70–99)
Glucose-Capillary: 140 mg/dL — ABNORMAL HIGH (ref 70–99)
Glucose-Capillary: 260 mg/dL — ABNORMAL HIGH (ref 70–99)

## 2020-09-05 LAB — BASIC METABOLIC PANEL
Anion gap: 8 (ref 5–15)
BUN: 25 mg/dL — ABNORMAL HIGH (ref 6–20)
CO2: 29 mmol/L (ref 22–32)
Calcium: 9.4 mg/dL (ref 8.9–10.3)
Chloride: 102 mmol/L (ref 98–111)
Creatinine, Ser: 1.14 mg/dL — ABNORMAL HIGH (ref 0.44–1.00)
GFR, Estimated: 58 mL/min — ABNORMAL LOW (ref >60)
Glucose, Bld: 159 mg/dL — ABNORMAL HIGH (ref 70–99)
Potassium: 3.8 mmol/L (ref 3.5–5.1)
Sodium: 139 mmol/L (ref 135–145)

## 2020-09-05 NOTE — Progress Notes (Signed)
Patient seen and evaluated she has had discussion with family members and is now agreeable to going to [facility]. Creatinine levels improved with no further need to recheck in am. Please refer to discharge summary dictated on 5/15 for full details.  Total care time: 30 minutes.

## 2020-09-05 NOTE — Progress Notes (Signed)
Physical Therapy Treatment Patient Details Name: Connie Shepard MRN: [id number] DOB: 1966-03-03 Today's Date: 09/05/2020    History of Present Illness Connie Shepard is a 55 y.o. female with medical history significant of chronic combined systolic and diastolic heart failure, elevated troponin, hypertension, ADD, history of bronchitis, depression, class III obesity who was brought to the emergency department due to right-sided weakness after having a fall at home.  She was last seen well the previous date at 1700.  Her family became concerned and called EMS.  She initially refused to come to the ED.  She has motor aphasia and is unable to provide further information at this time.    PT Comments    Patient demonstrates improvement maintaining sitting balance while completing trunk control/core stability exercises while seated at bedside with occasional loss of balance when leaning backwards, able to stand for up to 2 minutes leaning on hemi-walker, but poor return for taking steps due RLE giving way due to weakness, and required right knee blocked to transfer to chair using Hemi-walker.  Patient tolerated sitting up in chair for greater than 2 hours and put back to bed by nursing staff without use of mechanical lift.  Patient will benefit from continued physical therapy in hospital and recommended venue below to increase strength, balance, endurance for safe ADLs and gait.   Follow Up Recommendations  CIR     Equipment Recommendations  Other (comment) (to be determined)    Recommendations for Other Services       Precautions / Restrictions Precautions Precautions: Fall Precaution Comments: R side hemiparesis Restrictions Weight Bearing Restrictions: No    Mobility  Bed Mobility Overal bed mobility: Needs Assistance Bed Mobility: Rolling;Sidelying to Sit Rolling: Min assist Sidelying to sit: Mod assist;Max assist       General bed mobility comments: increased time, labored  movement    Transfers Overall transfer level: Needs assistance Equipment used: Hemi-walker Transfers: Sit to/from [employer] Sit to Stand: Mod assist Stand pivot transfers: Mod assist       General transfer comment: fair/poor carryover for using Hemi-walker due to not leaning on it for support, tends to hold onto helper with left hand  Ambulation/Gait Ambulation/Gait assistance: Mod assist;Max assist Gait Distance (Feet): 3 Feet Assistive device: Hemi-walker Gait Pattern/deviations: Decreased stance time - right;Decreased step length - right;Decreased stride length Gait velocity: slow   General Gait Details: limited to 3-4 unsteady labored side steps with right knee blocked to avoid loss of balance   Stairs             Wheelchair Mobility    Modified Rankin (Stroke Patients Only)       Balance Overall balance assessment: Needs assistance Sitting-balance support: Feet supported;No upper extremity supported Sitting balance-Leahy Scale: Fair Sitting balance - Comments: seated at EOB   Standing balance support: During functional activity;Single extremity supported Standing balance-Leahy Scale: Poor Standing balance comment: using Hemi-walker                            Cognition Arousal/Alertness: Awake/alert Behavior During Therapy: WFL for tasks assessed/performed Overall Cognitive Status: Within Functional Limits for tasks assessed                                        Exercises General Exercises - Lower Extremity Long Arc Quad: Seated;AAROM;Strengthening;Both;AROM;10 reps Hip Flexion/Marching: Seated;AAROM;Strengthening;AROM;Both;10  reps Toe Raises: Seated;AROM;Strengthening;Left;10 reps Heel Raises: Seated;AROM;Strengthening;10 reps;Left Other Exercises Other Exercises: trunk control exercises seated at bedside to include left/right, forward/backwards and circular movement with arms crossed x 10 reps each     General Comments        Pertinent Vitals/Pain Pain Assessment: Faces Faces Pain Scale: Hurts a little bit Pain Location: low back Pain Descriptors / Indicators: Sore Pain Intervention(s): Limited activity within patient's tolerance;Monitored during session;Repositioned    Home Living                      Prior Function            PT Goals (current goals can now be found in the care plan section) Acute Rehab PT Goals Patient Stated Goal: return home after rehab PT Goal Formulation: With patient Time For Goal Achievement: 09/08/20 Potential to Achieve Goals: Good Progress towards PT goals: Progressing toward goals    Frequency    Min 5X/week      PT Plan Current plan remains appropriate    Co-evaluation              AM-PAC PT "6 Clicks" Mobility   Outcome Measure  Help needed turning from your back to your side while in a flat bed without using bedrails?: A Lot Help needed moving from lying on your back to sitting on the side of a flat bed without using bedrails?: A Lot Help needed moving to and from a bed to a chair (including a wheelchair)?: A Lot Help needed standing up from a chair using your arms (e.g., wheelchair or bedside chair)?: A Lot Help needed to walk in hospital room?: Total Help needed climbing 3-5 steps with a railing? : Total 6 Click Score: 10    End of Session   Activity Tolerance: Patient tolerated treatment well;Patient limited by fatigue Patient left: in chair;with call bell/phone within reach Nurse Communication: Mobility status PT Visit Diagnosis: Unsteadiness on feet (R26.81);Other abnormalities of gait and mobility (R26.89);Muscle weakness (generalized) (M62.81)     Time: 3382-5053 PT Time Calculation (min) (ACUTE ONLY): 30 min  Charges:  $Therapeutic Exercise: 8-22 mins $Therapeutic Activity: 8-22 mins                     3:02 PM, 09/05/20 Ocie Bob, MPT Physical Therapist with [facility] [phone] office [phone] mobile phone

## 2020-09-05 NOTE — TOC Progression Note (Signed)
Transition of Care [facility]) - Progression Note    Patient Details  Name: Connie Shepard MRN: [id number] Date of Birth: 07-18-1965  Transition of Care [facility]) CM/SW Contact  Karn Cassis, [place] Phone Number: 09/05/2020, 3:13 PM  Clinical Narrative:  Authorization still pending for SNF. Pt's mother updated. TOC will continue to follow.      Expected Discharge Plan: (P) Skilled Nursing Facility Barriers to Discharge: (P) Continued Medical Work up  Expected Discharge Plan and Services Expected Discharge Plan: (P) Skilled Nursing Facility         Expected Discharge Date: 08/29/20                                     Social Determinants of Health (SDOH) Interventions    Readmission Risk Interventions No flowsheet data found.

## 2020-09-06 DIAGNOSIS — I639 Cerebral infarction, unspecified: Secondary | ICD-10-CM | POA: Diagnosis not present

## 2020-09-06 LAB — GLUCOSE, CAPILLARY
Glucose-Capillary: 158 mg/dL — ABNORMAL HIGH (ref 70–99)
Glucose-Capillary: 209 mg/dL — ABNORMAL HIGH (ref 70–99)
Glucose-Capillary: 147 mg/dL — ABNORMAL HIGH (ref 70–99)
Glucose-Capillary: 181 mg/dL — ABNORMAL HIGH (ref 70–99)

## 2020-09-06 LAB — RESP PANEL BY RT-PCR (FLU A&B, COVID) ARPGX2
Influenza A by PCR: NEGATIVE
Influenza B by PCR: NEGATIVE
SARS Coronavirus 2 by RT PCR: NEGATIVE

## 2020-09-06 MED ORDER — SPIRONOLACTONE 25 MG PO TABS
25.0000 mg | ORAL_TABLET | Freq: Every day | ORAL | Status: DC
  Administered 2020-09-06 – 2020-09-07 (×2): 25 mg via ORAL
  Filled 2020-09-06 (×2): qty 1

## 2020-09-06 MED ORDER — HYDRALAZINE HCL 25 MG PO TABS: 25.0000 mg | ORAL_TABLET | Freq: Three times a day (TID) | ORAL | Status: DC

## 2020-09-06 MED ORDER — SACUBITRIL-VALSARTAN 49-51 MG PO TABS
1.0000 | ORAL_TABLET | Freq: Two times a day (BID) | ORAL | Status: DC
  Administered 2020-09-06 – 2020-09-07 (×3): 1 via ORAL
  Filled 2020-09-06 (×3): qty 1

## 2020-09-06 NOTE — Progress Notes (Signed)
Occupational Therapy Treatment Patient Details Name: Connie Shepard MRN: [id number] DOB: Dec 30, 1965 Today's Date: 09/06/2020    History of present illness Connie Shepard is a 55 y.o. female with medical history significant of chronic combined systolic and diastolic heart failure, elevated troponin, hypertension, ADD, history of bronchitis, depression, class III obesity who was brought to the emergency department due to right-sided weakness after having a fall at home.  She was last seen well the previous date at 1700.  Her family became concerned and called EMS.  She initially refused to come to the ED.  She has motor aphasia and is unable to provide further information at this time.   OT comments  Pt pleasant and agreeable to OT treatment this date. Pt demonstrates improvements in postural stability due to being able to sit upright without assist and no UE support for 3+ minutes. Pt also improved with lateral propping to R elbow with Min A needed this date rather than moderate. Pt also was able to transfer to chair from EOB using Hemi-walker and mod to max support. Pt will continue to benefit from acute OT services to address deficit areas prior to discharge.   Follow Up Recommendations  CIR    Equipment Recommendations  None recommended by OT          Precautions / Restrictions Precautions Precautions: Fall Precaution Comments: R side hemiparesis Restrictions Weight Bearing Restrictions: No       Mobility Bed Mobility Overal bed mobility: Needs Assistance Bed Mobility: Supine to Sit     Supine to sit: Mod assist;HOB elevated     General bed mobility comments: increased time, labored movement    Transfers Overall transfer level: Needs assistance Equipment used: Hemi-walker Transfers: Sit to/from [employer] Sit to Stand: Mod assist;Max assist Stand pivot transfers: Mod assist;Max assist       General transfer comment: cuing to not hold on to helper but  use Hemi-Walker. Assist to block R knee from buckling.    Balance Overall balance assessment: Needs assistance Sitting-balance support: Feet supported;No upper extremity supported Sitting balance-Leahy Scale: Fair Sitting balance - Comments: seated at EOB   Standing balance support: During functional activity;Single extremity supported Standing balance-Leahy Scale: Poor Standing balance comment: using Hemi-walker                           ADL either performed or assessed with clinical judgement   ADL                   Upper Body Dressing : Supervision/safety;Sitting (donning gown with cuing and education on dressing with flaccid R UE.)                                             Cognition Arousal/Alertness: Awake/alert Behavior During Therapy: WFL for tasks assessed/performed Overall Cognitive Status: Within Functional Limits for tasks assessed                                          Exercises Other Exercises Other Exercises: Tunk control and R UE weight bearing via x5 reps of lateral propping to R side holding for 3 seconds with Min A. Able to sit at EOB for 3+ minutes without UE support  and no loss control.                Pertinent Vitals/ Pain       Pain Assessment: No/denies pain                                                          Frequency  Min 2X/week        Progress Toward Goals  OT Goals(current goals can now be found in the care plan section)  Progress towards OT goals: Progressing toward goals  Acute Rehab OT Goals Patient Stated Goal: return home after rehab OT Goal Formulation: With patient Time For Goal Achievement: 09/08/20 Potential to Achieve Goals: Fair ADL Goals Pt Will Perform Eating: with set-up;sitting;bed level Pt Will Perform Grooming: with min assist;with supervision;sitting Pt Will Perform Upper Body Dressing: with min assist;with supervision;with  adaptive equipment;sitting Pt Will Perform Lower Body Dressing: with mod assist;with adaptive equipment;sitting/lateral leans;bed level Pt Will Transfer to Toilet: with min assist;grab bars;stand pivot transfer Pt/caregiver will Perform Home Exercise Program: Right Upper extremity;Increased ROM;With minimal assist Additional ADL Goal #1: Pt will demonstrated improved postural stability by sitting upright at EOB with SPV and completing lateral leans with Min guard to SPV while demosntrated R UE weight bearing to assist with lateral leans.  Plan Discharge plan remains appropriate                                    End of Session Equipment Utilized During Treatment: Other (comment) (Hemi-walker)  OT Visit Diagnosis: Unsteadiness on feet (R26.81);Other abnormalities of gait and mobility (R26.89);Other symptoms and signs involving the nervous system (R29.898);Cognitive communication deficit (R41.841);Hemiplegia and hemiparesis Symptoms and signs involving cognitive functions: Cerebral infarction Hemiplegia - Right/Left: Right Hemiplegia - dominant/non-dominant: Dominant Hemiplegia - caused by: Cerebral infarction   Activity Tolerance Patient tolerated treatment well   Patient Left with chair alarm set;with call bell/phone within reach;in chair   Nurse Communication Other (comment) (Nursind notified that pt was placed in the chair.)        Time: 4332-9518 OT Time Calculation (min): 36 min  Charges: OT General Charges $OT Visit: 1 Visit OT Treatments $Therapeutic Activity: 23-37 mins  Myonna Chisom OT, MOT    Danie Chandler 09/06/2020, 11:36 AM

## 2020-09-06 NOTE — TOC Transition Note (Signed)
Transition of Care [facility]) - CM/SW Discharge Note   Patient Details  Name: Connie Shepard MRN: [id number] Date of Birth: [birth year]  Transition of Care [facility]) CM/SW Contact:  Annice Needy, LCSW Phone Number: 09/06/2020, 4:07 PM   Clinical Narrative:    Discharge clinicals sent to facility. RN to call report. TOC signing off.    Final next level of care: Skilled Nursing Facility Barriers to Discharge: No Barriers Identified   Patient Goals and CMS Choice        Discharge Placement              Patient chooses bed at: [facility] Patient to be transferred to facility by: [facility] Name of family member notified: Ms. Carleene Cooper, mother Patient and family notified of of transfer: 09/06/20  Discharge Plan and Services                                     Social Determinants of Health (SDOH) Interventions     Readmission Risk Interventions No flowsheet data found.

## 2020-09-06 NOTE — Progress Notes (Signed)
  Speech Language Pathology Treatment: Cognitive-Linquistic  Patient Details Name: Connie Shepard MRN: [id number] DOB: [birth year] Today's Date: 09/06/2020 Time: 2878-6767 SLP Time Calculation (min) (ACUTE ONLY): 20 min  Assessment / Plan / Recommendation Clinical Impression  SLP provided ongoing diagnostic speech therapy targeting dysarthria; Pt's speech clarity has improved since initial SLE, however intelligibility is still slightly reduced. Pt was pleasant and cooperative with desire to participate in thearpy. Targeting dysarthria, SLP reviewed strategies including over-articulation and slowed rate. Pt was very stimulable for improved clarity utilizing strategies as instructed with challenging multisyllabic words.  Pt reports continued tolerance of current diet and pleasantly refused trials at this time. SLP reviewed universal aspiration precautions and recommendations made on recent MBSS. Pt will benefit from continued ST for dysarthria and dysphagia while in acute and for more in depth SLE upon arrival to d/c location and ongoing dysphagia therapy to ensure usage of recommended compensatory strategies in d/c location as well. Thank you,   HPI HPI: Connie Shepard is a 55 y.o. female with medical history significant of chronic combined systolic and diastolic heart failure, elevated troponin, hypertension, ADD, history of bronchitis, depression, class III obesity who was brought to the emergency department due to right-sided weakness after having a fall at home.  She was last seen well the previous date at 1700.  Her family became concerned and called EMS.  She initially refused to come to the ED.  She has motor aphasia and is unable to provide further information at this time.SLE requested. 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 Additional small subacute lacunar type infarct  involving the left periatrial white matter.      SLP Plan  Continue  with current plan of care       Recommendations  Diet recommendations: Thin liquid;Dysphagia 3 (mechanical soft) Liquids provided via: Cup;Straw Medication Administration: Whole meds with liquid Supervision: Staff to assist with self feeding;Full supervision/cueing for compensatory strategies Compensations: Slow rate;Small sips/bites Postural Changes and/or Swallow Maneuvers: Out of bed for meals;Seated upright 90 degrees;Upright 30-60 min after meal                Plan: Continue with current plan of care       Nasif Bos H. Romie Levee, CCC-SLP Speech Language Pathologist    [name] 09/06/2020, 3:05 PM

## 2020-09-06 NOTE — Progress Notes (Signed)
Patient seen and evaluated bedside and she is agreeable to going to SNF.  No acute overnight events or concerns noted.  Please refer to discharge summary dictated 5/15 for full details.  COVID testing ordered noted to be negative.  Total care time: 20 minutes.

## 2020-09-07 LAB — GLUCOSE, CAPILLARY
Glucose-Capillary: 185 mg/dL — ABNORMAL HIGH (ref 70–99)
Glucose-Capillary: 174 mg/dL — ABNORMAL HIGH (ref 70–99)

## 2020-09-07 LAB — CREATININE, SERUM
Creatinine, Ser: 0.96 mg/dL (ref 0.44–1.00)
GFR, Estimated: >60 mL/min (ref >60)

## 2020-09-07 NOTE — Progress Notes (Signed)
Patient was not transferred to facility due to EMS not getting here before 2100. [facility] does not take admissions after 2100. Patient is to go in am.

## 2020-09-07 NOTE — Progress Notes (Signed)
Patient seen and examined. Stable for discharge to SNF for further care and conditioning. VS stable and in no acute distress. AAOX3. Pleaser refer to updated discharge summary initially created on 08/28/20 for details and further recommendations.  Vassie Loll MD [phone]

## 2021-06-27 NOTE — Progress Notes (Deleted)
? ?Office Visit  ?  ?[name] ?Date of Encounter: 06/27/2021 ? ?PCP:  Neale Burly, MD ?  ?[place]  ?Cardiologist:  Minus Breeding, MD  ?Advanced Practice Provider:  No care team member to display ?Electrophysiologist:  None  ? ? ?Chief Complaint  ?  ?Connie Shepard is a 56 y.o. female with a hx of *** presents today for ***  ? ?Past Medical History  ?  ?Past Medical History:  ?Diagnosis Date  ? Acute heart failure ([place])   ? ADD (attention deficit disorder)   ? Bronchitis   ? Depression   ? Diabetes mellitus without complication ([place])   ? Elevated troponin   ? Hypertension   ? Obesity   ? ?Past Surgical History:  ?Procedure Laterality Date  ? CESAREAN SECTION    ? ? ?Allergies ? ?Allergies  ?Allergen Reactions  ? Hydroxyzine   ?  Bad dreams  ? ? ?History of Present Illness  ?  ?Connie Shepard is a 56 y.o. female with a hx of CVA, DM2, HTN, HFrEF, depression, insomnia, hypothyroidism, obesity*** last seen 03/18/20 by Dr. Percival Spanish. ? ?08/2017 admission with acute CHF exacerbation and hypertensive urgency (LVEF 30%). Admission 02/2020 with volume overload in setting of medication noncompliance (LVEF 35%). She was diuresed over 22L. She was seen 03/2020 by Dr. Percival Spanish in clinic as hospital follow up. Lengthy discussion regarding compliance with medications, daily weights, salt/fluid restriction. She was transitioned from Losartan to [facility] and recommended for follow up in 1 month which was not completed. ? ?Admitted 08/24/20 with CVA (MRi brain showing acute lacunar type infarct involving posterior limb of left internal capsule and small subacute lacunar type infarct of left periatrial white matter). Echo during admission with LVEF 20%, global hypokinesis, bubble studdy with no shunt. Carotid doppler without hemodynamically significant stenosis. She was discharged to [facility] in [place]. ? ?Per [place] note from 05/24/21 noting depression, not taking medications,  predominantly laying in bed with her elderly parents wait on her. ? ?Seen by PCP 06/26/21 with BP 196/140 in setting of not taking her medications that morning. She was additionally referred to [facility] and [facility].  ? ?EKGs/Labs/Other Studies Reviewed:  ? ?The following studies were reviewed today: ? ?Echo 08/25/20 ? 1. Left ventricular ejection fraction, by estimation, is approximately  ?20%. The left ventricle has severely decreased function. The left  ?ventricle demonstrates global hypokinesis. There is moderate left  ?ventricular hypertrophy. Left ventricular  ?diastolic parameters are indeterminate.  ? 2. Right ventricular systolic function is severely reduced. The right  ?ventricular size is normal. Tricuspid regurgitation signal is inadequate  ?for assessing PA pressure.  ? 3. The mitral valve is abnormal, mildly calcified. Mild mitral valve  ?regurgitation.  ? 4. The aortic valve is tricuspid. Aortic valve regurgitation is not  ?visualized.  ? 5. The inferior vena cava is normal in size with <50% respiratory  ?variability, suggesting right atrial pressure of 8 mmHg.  ? 6. Agitated saline contrast bubble study was negative, with no evidence  ?of any interatrial shunt.  ? ?Carotid duplex 08/25/20 ?IMPRESSION: ?No significant stenosis internal carotid arteries. ? ?Echo 02/2020 ? 1. Very limited images.  ? 2. Left ventricular ejection fraction, by estimation, is approximately  ?35%. The left ventricle has moderately decreased function. Left  ?ventricular endocardial border not optimally defined to evaluate regional  ?wall motion even with Definity contrast.  ?There is mild left ventricular hypertrophy. Left ventricular diastolic  ?parameters  are indeterminate.  ? 3. Right ventricular systolic function is severely reduced. The right  ?ventricular size is normal.  ? 4. The mitral valve is grossly normal. Trivial mitral valve  ?regurgitation.  ? 5. The aortic valve is tricuspid. There is mild calcification of the  ?aortic  valve. Aortic valve regurgitation is not visualized.  ? 6. Unable to estimate CVP.  ? ?Echo 08/2017 ?- Left ventricle: The cavity size was normal. Wall thickness was  ?  increased in a pattern of mild LVH. Systolic function was  ?  moderately to severely reduced. The estimated ejection fraction  ?  was in the range of 30% to 35%. Diffuse hypokinesis. Features are  ?  consistent with a pseudonormal left ventricular filling pattern,  ?  with concomitant abnormal relaxation and increased filling  ?  pressure (grade 2 diastolic dysfunction). Doppler parameters are  ?  consistent with high ventricular filling pressure.  ?- Mitral valve: Calcified annulus.  ?- Left atrium: The atrium was mildly dilated.  ?- Pericardium, extracardiac: A small pericardial effusion was  ?  identified.  ? ?EKG:  EKG is *** ordered today.  The ekg ordered today demonstrates *** ? ?Recent Labs: ?08/25/2020: ALT 20 ?08/27/2020: Hemoglobin 12.7; Platelets 192; TSH 5.372 ?09/03/2020: Magnesium 1.7 ?09/05/2020: BUN 25; Potassium 3.8; Sodium 139 ?09/07/2020: Creatinine, Ser 0.96  ?Recent Lipid Panel ?   ?Component Value Date/Time  ? CHOL 181 08/25/2020 0349  ? CHOL 206 (H) 09/03/2018 1450  ? TRIG 160 (H) 08/25/2020 0349  ? HDL 39 (L) 08/25/2020 0349  ? HDL 48 09/03/2018 1450  ? CHOLHDL 4.6 08/25/2020 0349  ? VLDL 32 08/25/2020 0349  ? [place] 110 (H) 08/25/2020 0349  ? LDLCALC 118 (H) 09/03/2018 1450  ? ?Home Medications  ? ?No outpatient medications have been marked as taking for the 06/28/21 encounter (Appointment) with Loel Dubonnet, NP.  ?  ? ?Review of Systems  ?    ?All other systems reviewed and are otherwise negative except as noted above. ? ?Physical Exam  ?  ?VS:  There were no vitals taken for this visit. , BMI There is no height or weight on file to calculate BMI. ? ?Wt Readings from Last 3 Encounters:  ?08/25/20 253 lb 8.5 oz (115 kg)  ?03/18/20 260 lb 6.4 oz (118.1 kg)  ?03/13/20 (!) 314 lb 6 oz (142.6 kg)  ?  ? ?GEN: Well nourished, well  developed, in no acute distress. ?HEENT: normal. ?Neck: Supple, no JVD, carotid bruits, or masses. ?Cardiac: ***RRR, no murmurs, rubs, or gallops. No clubbing, cyanosis, edema.  ***Radials/PT 2+ and equal bilaterally.  ?Respiratory:  ***Respirations regular and unlabored, clear to auscultation bilaterally. ?GI: Soft, nontender, nondistended. ?MS: No deformity or atrophy. ?Skin: Warm and dry, no rash. ?Neuro:  Strength and sensation are intact. ?Psych: Normal affect. ? ?Assessment & Plan  ?  ?HTN -  ? ?HFrEF -  ? ?Hx of CVA - Admission 08/2020. *** ? ?DM2 - 05/15/21 A1c 8.2. *** ? ?HLD - 05/15/21 total cholesterol 151, triglycerides 219, HDL 47, LDL 68. *** ? ?Morbid obesity -  ? ?Disposition: Follow up {follow up:15908} with Minus Breeding, MD or APP. ? ?Signed, ?Loel Dubonnet, NP ?06/27/2021, 8:08 PM ?[place] ?

## 2021-06-28 ENCOUNTER — Ambulatory Visit (HOSPITAL_BASED_OUTPATIENT_CLINIC_OR_DEPARTMENT_OTHER): Payer: 59 | Admitting: Family

## 2021-07-04 ENCOUNTER — Other Ambulatory Visit: Payer: Self-pay

## 2021-07-04 ENCOUNTER — Ambulatory Visit: Payer: 59

## 2021-07-04 NOTE — Therapy (Signed)
Upon further assessment, the patient would benefit from specialized rehabilitation at [facility]. She agreed with this recommendation.  ? ?Candi Leash, PT, DPT  ?

## 2021-07-13 ENCOUNTER — Ambulatory Visit (HOSPITAL_COMMUNITY): Payer: 59 | Attending: Family Medicine | Admitting: Physical Therapy

## 2021-07-13 ENCOUNTER — Encounter (HOSPITAL_COMMUNITY): Payer: Self-pay | Admitting: Physical Therapy

## 2021-07-13 DIAGNOSIS — M6281 Muscle weakness (generalized): Secondary | ICD-10-CM | POA: Diagnosis present

## 2021-07-13 DIAGNOSIS — R2689 Other abnormalities of gait and mobility: Secondary | ICD-10-CM | POA: Diagnosis present

## 2021-07-13 NOTE — Therapy (Signed)
?OUTPATIENT PHYSICAL THERAPY NEURO EVALUATION ? ? ?[name] ?MRN: [id number] ?DOB:08-15-1965, 56 y.o., female ?Today's Date: 07/13/2021 ? ?PCP: Toma DeitersHasanaj, Xaje A, MD ?REFERRING PROVIDER: Vivien Prestoorrington, Kip A, MD ? ? PT End of Session - 07/13/21 1314   ? ? Visit Number 1   ? Number of Visits 12   ? Date for PT Re-Evaluation 08/24/21   ? Authorization Type [employer] (60 VL 1 used, 59 remain)   ? Authorization Time Period --   ? Authorization - Visit Number 1   ? Authorization - Number of Visits 59   ? PT Start Time 1315   ? PT Stop Time 1355   ? PT Time Calculation (min) 40 min   ? Equipment Utilized During Treatment Gait belt   ? Activity Tolerance Patient tolerated treatment well   ? Behavior [facility] tasks assessed/performed   ? ?  ?  ? ?  ? ? ?Past Medical History:  ?Diagnosis Date  ? Acute heart failure (HCC)   ? ADD (attention deficit disorder)   ? Bronchitis   ? Depression   ? Diabetes mellitus without complication (HCC)   ? Elevated troponin   ? Hypertension   ? Obesity   ? ?Past Surgical History:  ?Procedure Laterality Date  ? CESAREAN SECTION    ? ?Patient Active Problem List  ? Diagnosis Date Noted  ? Chronic combined systolic and diastolic congestive heart failure (HCC) 08/25/2020  ? Acute CVA (cerebrovascular accident) (HCC) 08/24/2020  ? Borderline prolonged QT interval 08/24/2020  ? Type 2 diabetes mellitus with complication, without long-term current use of insulin (HCC) 03/16/2020  ? Anasarca   ? Diabetes mellitus (HCC)   ? Hypokalemia   ? Vitamin D deficiency   ? Hypothyroidism   ? Physical deconditioning   ? Acute on chronic systolic CHF (congestive heart failure) (HCC) 03/09/2020  ? Acute on chronic systolic HF (heart failure) (HCC) 03/09/2020  ? Hyperglycemia 12/22/2018  ? Hypertensive crisis 04/04/2018  ? Acute CHF (congestive heart failure) (HCC) 04/04/2018  ? Mild renal insufficiency 04/04/2018  ? Elevated troponin 04/04/2018  ? Bronchitis   ? Obesity   ?  Seborrheic dermatitis of scalp 05/23/2016  ? Essential hypertension 12/02/2015  ? Depression 12/02/2015  ? Primary insomnia 12/02/2015  ? Morbid obesity with body mass index of 50.0-59.9 in adult [facility](HCC) 12/02/2015  ? Attention deficit hyperactivity disorder (ADHD), predominantly inattentive type 12/02/2015  ? ? ?ONSET DATE: 08/23/20 ? ?REFERRING DIAG: 3Z86.73 (ICD-10-CM) - Personal history of transient ischemic attack (TIA), and cerebral infarction without residual deficits  ? ?THERAPY DIAG:  ?Other abnormalities of gait and mobility ? ?Muscle weakness (generalized) ? ?SUBJECTIVE:  ?                                                                                                                                                                                           ? ?  SUBJECTIVE STATEMENT: ?Patient states she has practiced walking but mostly does the chair. She has R sided weakness. No falls. She was not having mobility issues before CVA on 08/23/20. ?Pt accompanied by: family member ? ?PERTINENT HISTORY: Essential hypertension;  ?Hypothyroidism due to Hashimoto's thyroiditis;  ?CVD (cerebrovascular disease);  ?History of CVA (cerebrovascular accident);  ?Type 2 diabetes mellitus with hyperglycemia  ? ?PAIN:  ?Are you having pain? No ? ?PRECAUTIONS: Fall ? ?WEIGHT BEARING RESTRICTIONS No ? ?FALLS: Has patient fallen in last 6 months? No ? ?LIVING ENVIRONMENT: ?Lives with: lives with their family ?Lives in: House/apartment ?Stairs:  ramp ?Has following equipment at home: Dan Humphreys - 2 wheeled ? ?PLOF: Independent ? ?PATIENT GOALS improve leg movement ? ?OBJECTIVE:  ? ?DIAGNOSTIC FINDINGS: MRI 08/24/20 IMPRESSION: ?Acute lacunar type infarct involving the posterior limb of the left ?internal capsule. Additional small subacute lacunar type infarct ?involving the left periatrial white matter. ? ?COGNITION: ?Overall cognitive status: Within functional limits for tasks assessed ?  ?SENSATION: ?WFL ? ? ? ?POSTURE: rounded  shoulders and forward head ? ?LE ROM:    WFL for tasks assessed ? ?Active  Right ?07/13/2021 Left ?07/13/2021  ?Hip flexion    ?Hip extension    ?Hip abduction    ?Hip adduction    ?Hip internal rotation    ?Hip external rotation    ?Knee flexion    ?Knee extension    ?Ankle dorsiflexion    ?Ankle plantarflexion    ?Ankle inversion    ?Ankle eversion    ? (Blank rows = not tested) ? ?MMT:   ? ?MMT Right ?07/13/2021 Left ?07/13/2021  ?Hip flexion 3-/5 5/5  ?Hip extension    ?Hip abduction    ?Hip adduction    ?Hip internal rotation    ?Hip external rotation    ?Knee flexion 3+/5 5/5  ?Knee extension 4-/5 5/5  ?Ankle dorsiflexion 3/5 5/5  ?Ankle plantarflexion    ?Ankle inversion    ?Ankle eversion    ?(Blank rows = not tested) ? ? ?TRANSFERS: ?Assistive device utilized: Product manager (manual)  ?Sit to stand: Mod A ?Stand to sit: Mod A ? ? ? ?GAIT: ?Gait pattern: ataxic ?Distance walked: 3 feet ?Assistive device utilized:  hemiwalker ?Level of assistance:  min/mod assist ?Comments: unsteady ? ? ?TODAY'S TREATMENT:  ?07/13/21 ?Marching 2x 10  ?LAQ 2x 10 5 Second ?Heel/toe raises 2 x 10 5 second hold ? ? ?PATIENT EDUCATION:  ?Education details: Patient educated on exam findings, POC, scope of PT, HEP, and increasing activity. ?Person educated: Patient ?Education method: Explanation, Demonstration, and Handouts ?Education comprehension: verbalized understanding, returned demonstration, verbal cues required, and tactile cues required  ? ? ?HOME EXERCISE PROGRAM: ?Marching 2x 10  ?LAQ 2x 10 5 Second ?Heel/toe raises 2 x 10 5 second hold ? ? ? ?GOALS: ?Goals reviewed with patient? Yes ? ?SHORT TERM GOALS: Target date: 08/03/2021 ? ?Patient will be independent with HEP in order to improve functional outcomes. ?Baseline:  ?Goal status: INITIAL ? ?2.  Patient will report at least 25% improvement in symptoms for improved quality of life. ?Baseline:  ?Goal status: INITIAL ? ? ?LONG TERM GOALS: Target date:  08/24/2021 ? ?Patient will report at least 75% improvement in symptoms for improved quality of life. ?Baseline:  ?Goal status: INITIAL ? ?2.  Patient will be able to perform sit to stand without UE use in order to demonstrate improved functional strength. ?Baseline:  ?Goal status: INITIAL ? ?3.  Patient will be able to  stand/walk for 5 minutes with LRAD use for balance in order to complete household tasks. ?Baseline:  ?Goal status: INITIAL ? ?  ? ?ASSESSMENT: ? ?CLINICAL IMPRESSION: ?Patient a 56 y.o. y.o. female who was seen today for physical therapy evaluation and treatment for s/p CVA with strength, gait, and balance deficits. Patient will continue to benefit from physical therapy in order to improve function and reduce impairment. ? ? ? ?OBJECTIVE IMPAIRMENTS Abnormal gait, decreased activity tolerance, decreased balance, decreased endurance, decreased knowledge of use of DME, decreased mobility, difficulty walking, decreased ROM, decreased strength, impaired flexibility, impaired UE functional use, improper body mechanics, and obesity.  ? ?ACTIVITY LIMITATIONS cleaning, community activity, driving, meal prep, occupation, laundry, yard work, shopping, and yard work.  ? ?PERSONAL FACTORS Behavior pattern, Fitness, Past/current experiences, Time since onset of injury/illness/exacerbation, and 3+ comorbidities: CVA, increased BMI, sedentary   are also affecting patient's functional outcome.  ? ? ?REHAB POTENTIAL: Good ? ?CLINICAL DECISION MAKING: Evolving/moderate complexity ? ?EVALUATION COMPLEXITY: Moderate ? ?PLAN: ?PT FREQUENCY: 2x/week ? ?PT DURATION: 6 weeks ? ?PLANNED INTERVENTIONS: Therapeutic exercises, Therapeutic activity, Neuromuscular re-education, Balance training, Gait training, Patient/Family education, Joint manipulation, Joint mobilization, Stair training, Orthotic/Fit training, DME instructions, Aquatic Therapy, Dry Needling, Electrical stimulation, Spinal manipulation, Spinal mobilization,  Cryotherapy, Moist heat, Compression bandaging, scar mobilization, Splintting, Taping, Traction, Ultrasound, Ionotophoresis 4mg /ml Dexamethasone, and Manual therapy ? ? ?PLAN FOR NEXT SESSION: f/u with HEP and increasing home 

## 2021-07-13 NOTE — Patient Instructions (Signed)
Access Code: [email or web address] ?URL: [email or web address] ?Date: 07/13/2021 ?Prepared by: Greig Castilla Breslin Hemann ? ?Exercises ?- Seated March  - 5 x daily - 7 x weekly - 2 sets - 10 reps - 5 second hold ?- Seated Long Arc Quad  - 5 x daily - 7 x weekly - 2 sets - 10 reps - 5 second hold ?- Seated Heel Toe Raises  - 5 x daily - 7 x weekly - 2 sets - 10 reps - 5 second hold ?

## 2021-07-17 ENCOUNTER — Telehealth (HOSPITAL_COMMUNITY): Payer: Self-pay

## 2021-07-17 NOTE — Telephone Encounter (Signed)
She could not get here Transportation could not bring her - she will be here Friday-s/w patient's daddy ?

## 2021-07-18 ENCOUNTER — Ambulatory Visit (HOSPITAL_COMMUNITY): Payer: 59

## 2021-07-21 ENCOUNTER — Ambulatory Visit (HOSPITAL_COMMUNITY): Payer: 59 | Attending: Family Medicine | Admitting: Physical Therapy

## 2021-07-21 ENCOUNTER — Encounter (HOSPITAL_COMMUNITY): Payer: Self-pay | Admitting: Physical Therapy

## 2021-07-21 DIAGNOSIS — R2689 Other abnormalities of gait and mobility: Secondary | ICD-10-CM | POA: Insufficient documentation

## 2021-07-21 DIAGNOSIS — M6281 Muscle weakness (generalized): Secondary | ICD-10-CM | POA: Diagnosis not present

## 2021-07-21 DIAGNOSIS — R278 Other lack of coordination: Secondary | ICD-10-CM | POA: Diagnosis present

## 2021-07-21 DIAGNOSIS — R29898 Other symptoms and signs involving the musculoskeletal system: Secondary | ICD-10-CM | POA: Insufficient documentation

## 2021-07-21 DIAGNOSIS — R29818 Other symptoms and signs involving the nervous system: Secondary | ICD-10-CM | POA: Diagnosis present

## 2021-07-21 NOTE — Therapy (Addendum)
?OUTPATIENT PHYSICAL THERAPY TREATMENT NOTE ? ? ?Patient Name: Connie CapronDenise Shepard ?MRN: [id number] ?DOB:[birth year], 56 y.o., female ?Today's Date: 07/21/2021 ? ?PCP: Toma DeitersHasanaj, Xaje A, MD ?REFERRING PROVIDER: Toma DeitersHasanaj, Xaje A, MD ? ?END OF SESSION:  ? PT End of Session - 07/21/21 1623   ? ? Visit Number 2   ? Number of Visits 12   ? Date for PT Re-Evaluation 08/24/21   ? Authorization Type [employer] (60 VL 1 used, 59 remain)   ? Authorization - Visit Number 1   ? Authorization - Number of Visits 59   ? PT Start Time 1624   ? PT Stop Time 1704   ? PT Time Calculation (min) 40 min   ? Equipment Utilized During Treatment Gait belt   ? Activity Tolerance Patient tolerated treatment well   ? Behavior During Therapy [facility] for tasks assessed/performed   ? ?  ?  ? ?  ? ? ?Past Medical History:  ?Diagnosis Date  ? Acute heart failure (HCC)   ? ADD (attention deficit disorder)   ? Bronchitis   ? Depression   ? Diabetes mellitus without complication (HCC)   ? Elevated troponin   ? Hypertension   ? Obesity   ? ?Past Surgical History:  ?Procedure Laterality Date  ? CESAREAN SECTION    ? ?Patient Active Problem List  ? Diagnosis Date Noted  ? Chronic combined systolic and diastolic congestive heart failure (HCC) 08/25/2020  ? Acute CVA (cerebrovascular accident) (HCC) 08/24/2020  ? Borderline prolonged QT interval 08/24/2020  ? Type 2 diabetes mellitus with complication, without long-term current use of insulin (HCC) 03/16/2020  ? Anasarca   ? Diabetes mellitus (HCC)   ? Hypokalemia   ? Vitamin D deficiency   ? Hypothyroidism   ? Physical deconditioning   ? Acute on chronic systolic CHF (congestive heart failure) (HCC) 03/09/2020  ? Acute on chronic systolic HF (heart failure) (HCC) 03/09/2020  ? Hyperglycemia 12/22/2018  ? Hypertensive crisis 04/04/2018  ? Acute CHF (congestive heart failure) (HCC) 04/04/2018  ? Mild renal insufficiency 04/04/2018  ? Elevated troponin 04/04/2018  ? Bronchitis   ? Obesity   ? Seborrheic dermatitis of  scalp 05/23/2016  ? Essential hypertension 12/02/2015  ? Depression 12/02/2015  ? Primary insomnia 12/02/2015  ? Morbid obesity with body mass index of 50.0-59.9 in adult [facility](HCC) 12/02/2015  ? Attention deficit hyperactivity disorder (ADHD), predominantly inattentive type 12/02/2015  ? ? ?REFERRING DIAG: Z86.73 (ICD-10-CM) - Personal history of transient ischemic attack (TIA), and cerebral infarction without residual deficits  ?  ? ?THERAPY DIAG:  ?Muscle weakness (generalized) ? ?Other abnormalities of gait and mobility ? ?PERTINENT HISTORY:  Essential hypertension;  ?Hypothyroidism due to Hashimoto's thyroiditis;  ?CVD (cerebrovascular disease);  ?History of CVA (cerebrovascular accident);  ?Type 2 diabetes mellitus with hyperglycemia  ? ?PRECAUTIONS: fall  ? ?SUBJECTIVE: Pt has been doing the exercises everyday  ? ?PAIN:  ?Are you having pain? No once session begins pt c/o of back and  ?Rt shoulder pain with activity but subsides with rest.  ? ?   ?DIAGNOSTIC FINDINGS: MRI 08/24/20 IMPRESSION: ?Acute lacunar type infarct involving the posterior limb of the left ?internal capsule. Additional small subacute lacunar type infarct ?involving the left periatrial white matter. ? ?           ?MMT Right ?07/13/2021 Left ?07/13/2021  ?Hip flexion 3-/5 5/5  ?Hip extension  2-    ?Hip abduction  3-    ?Hip adduction      ?  Hip internal rotation      ?Hip external rotation      ?Knee flexion 2/5tested prone 5/5  ?Knee extension 4-/5 5/5  ?Ankle dorsiflexion 3/5 5/5  ?Sit to stand and transfer with mod assist with pt having a posterior lean. ?Sit to supine with mod assist ?Supine to side lying to prone with min assist  ? ?TX:  sit to stand x 5;  ?               Sitting:  LAQ x 10 with manual resistance  ?               Side lying hip abduction x 5 ?               Supine:  bridge x 10  ?                      SLR x 10               Isometric ab/adduction x 10  ?                      Clam x 10 ?                      Dead bug with  therapist assist of RT UE x 10  ?               Prone:  Knee flexion, SLR x 10@ ?  ? ?07/13/2021  ?LE ROM:    WFL for tasks assessed ?  ?Active  Right ?07/13/2021 Left ?07/13/2021  ?Hip flexion      ?Hip extension      ?Hip abduction      ?Hip adduction      ?Hip internal rotation      ?Hip external rotation      ?Knee flexion      ?Knee extension      ?Ankle dorsiflexion      ?Ankle plantarflexion      ?Ankle inversion      ?Ankle eversion      ? (Blank rows = not tested) ?  ?MMT:   ?  ?MMT Right ?07/13/2021 Left ?07/13/2021  ?Hip flexion 3-/5 5/5  ?Hip extension      ?Hip abduction      ?Hip adduction      ?Hip internal rotation      ?Hip external rotation      ?Knee flexion 3+/5 5/5  ?Knee extension 4-/5 5/5  ?Ankle dorsiflexion 3/5 5/5  ? ?Ankle plantarflexion      ?Ankle inversion      ?Ankle eversion      ?(Blank rows = not tested) ?  ?  ?TRANSFERS: ?Assistive device utilized: Product manager (manual)  ?Sit to stand: Mod A ?Stand to sit: Mod A ?  ?  ?  ?GAIT: ?Gait pattern: ataxic ?Distance walked: 3 feet ?Assistive device utilized:  hemiwalker ?Level of assistance:  min/mod assist ?Comments: unsteady ?  ?  ?TODAY'S TREATMENT:  ?07/13/21 ?Marching 2x 10  ?LAQ 2x 10 5 Second ?Heel/toe raises 2 x 10 5 second hold ?  ?  ?PATIENT EDUCATION:  ?Education details: Patient educated on exam findings, POC, scope of PT, HEP, and increasing activity. ?Person educated: Patient ?Education method: Explanation, Demonstration, and Handouts ?Education comprehension: verbalized understanding, returned demonstration, verbal cues required, and tactile cues required  ?  ?  ?HOME  EXERCISE PROGRAM: ?EValuation:Marching 2x 10  ?                   LAQ 2x 10 5 Second ?                   Heel/toe raises 2 x 10 5 second hold ?             07/21/2021  Side lying hip abduction x 5 ?               Supine:  bridge x 10  ?                              SLR x 10         ?  ?  ?GOALS: ?Goals reviewed with patient? Yes ?  ?SHORT TERM GOALS:  Target date: 08/03/2021 ?  ?Patient will be independent with HEP in order to improve functional outcomes. ?Baseline:  ?Goal status: ongoing  ?  ?2.  Patient will report at least 25% improvement in symptoms for improved quality of life. ?Baseline:  ?Goal status:ongoing  ?  ?  ?LONG TERM GOALS: Target date: 08/24/2021 ?  ?Patient will report at least 75% improvement in symptoms for improved quality of life. ?Baseline:  ?Goal status: ongoing  ?  ?2.  Patient will be able to perform sit to stand without UE use in order to demonstrate improved functional strength. ?Baseline:  ?Goal status: ongoing  ?  ?3.  Patient will be able to stand/walk for 5 minutes with LRAD use for balance in order to complete household tasks. ?Baseline:  ?Goal status: ongoing  ?  ?  ?  ?ASSESSMENT: ?  ?CLINICAL IMPRESSION: ? Therapist further assessed muscular strength and changed hamstring strength as it  ?Was not tested in prone position during the evaluation.  PT worked on strengthening and functional movement in this session with pt needing moderate assistance.  PT will continue to benefit from both skilled PT and OT to improve functioning mobility. ?  ?  ?  ?OBJECTIVE IMPAIRMENTS Abnormal gait, decreased activity tolerance, decreased balance, decreased endurance, decreased knowledge of use of DME, decreased mobility, difficulty walking, decreased ROM, decreased strength, impaired flexibility, impaired UE functional use, improper body mechanics, and obesity.  ?  ?ACTIVITY LIMITATIONS cleaning, community activity, driving, meal prep, occupation, laundry, yard work, shopping, and yard work.  ?  ?PERSONAL FACTORS Behavior pattern, Fitness, Past/current experiences, Time since onset of injury/illness/exacerbation, and 3+ comorbidities: CVA, increased BMI, sedentary   are also affecting patient's functional outcome.  ?  ?  ?REHAB POTENTIAL: Good ?  ?CLINICAL DECISION MAKING: Evolving/moderate complexity ?  ?EVALUATION COMPLEXITY: Moderate ?   ?PLAN: ?PT FREQUENCY: 2x/week ?  ?PT DURATION: 6 weeks ?  ?PLANNED INTERVENTIONS: Therapeutic exercises, Therapeutic activity, Neuromuscular re-education, Balance training, Gait training, Patient/Family education, J

## 2021-07-25 ENCOUNTER — Encounter (HOSPITAL_COMMUNITY): Payer: Self-pay

## 2021-07-25 ENCOUNTER — Ambulatory Visit (HOSPITAL_COMMUNITY): Payer: 59

## 2021-07-25 DIAGNOSIS — M6281 Muscle weakness (generalized): Secondary | ICD-10-CM

## 2021-07-25 DIAGNOSIS — R2689 Other abnormalities of gait and mobility: Secondary | ICD-10-CM

## 2021-07-25 NOTE — Therapy (Signed)
?OUTPATIENT PHYSICAL THERAPY TREATMENT NOTE ? ? ?[name] ?MRN: [id number] ?DOB:1965/08/06, 56 y.o., female ?Today's Date: 07/25/2021 ? ?PCP: Neale Burly, MD ?REFERRING PROVIDER: Neale Burly, MD ? ?END OF SESSION:  ? ? ? ?Past Medical History:  ?Diagnosis Date  ? Acute heart failure ([place])   ? ADD (attention deficit disorder)   ? Bronchitis   ? Depression   ? Diabetes mellitus without complication ([place])   ? Elevated troponin   ? Hypertension   ? Obesity   ? ?Past Surgical History:  ?Procedure Laterality Date  ? CESAREAN SECTION    ? ?Patient Active Problem List  ? Diagnosis Date Noted  ? Chronic combined systolic and diastolic congestive heart failure ([place]) 08/25/2020  ? Acute CVA (cerebrovascular accident) ([place]) 08/24/2020  ? Borderline prolonged QT interval 08/24/2020  ? Type 2 diabetes mellitus with complication, without long-term current use of insulin ([place]) 03/16/2020  ? Anasarca   ? Diabetes mellitus ([place])   ? Hypokalemia   ? Vitamin D deficiency   ? Hypothyroidism   ? Physical deconditioning   ? Acute on chronic systolic CHF (congestive heart failure) ([place]) 03/09/2020  ? Acute on chronic systolic HF (heart failure) ([place]) 03/09/2020  ? Hyperglycemia 12/22/2018  ? Hypertensive crisis 04/04/2018  ? Acute CHF (congestive heart failure) ([place]) 04/04/2018  ? Mild renal insufficiency 04/04/2018  ? Elevated troponin 04/04/2018  ? Bronchitis   ? Obesity   ? Seborrheic dermatitis of scalp 05/23/2016  ? Essential hypertension 12/02/2015  ? Depression 12/02/2015  ? Primary insomnia 12/02/2015  ? Morbid obesity with body mass index of 50.0-59.9 in adult [facility]) 12/02/2015  ? Attention deficit hyperactivity disorder (ADHD), predominantly inattentive type 12/02/2015  ? ? ?REFERRING DIAG: Z86.73 (ICD-10-CM) - Personal history of transient ischemic attack (TIA), and cerebral infarction without residual deficits  ?  ? ?THERAPY DIAG:  ?No diagnosis found. ? ?PERTINENT HISTORY:  Essential hypertension;   ?Hypothyroidism due to Hashimoto's thyroiditis;  ?CVD (cerebrovascular disease);  ?History of CVA (cerebrovascular accident);  ?Type 2 diabetes mellitus with hyperglycemia  ? ?PRECAUTIONS: fall  ? ?SUBJECTIVE: Pt stated she was stiff, arrived early for session and has been sitting in [facility] for 1.5hrs ? ?PAIN:  ?Are you having pain? No once session begins pt c/o of back and  ?Rt shoulder pain with activity but subsides with rest.  ? ?   ?DIAGNOSTIC FINDINGS: MRI 08/24/20 IMPRESSION: ?Acute lacunar type infarct involving the posterior limb of the left ?internal capsule. Additional small subacute lacunar type infarct ?involving the left periatrial white matter. ? ?           ?MMT Right ?07/13/2021 Left ?07/13/2021  ?Hip flexion 3-/5 5/5  ?Hip extension  2-    ?Hip abduction  3-    ?Hip adduction      ?Hip internal rotation      ?Hip external rotation      ?Knee flexion 2/5tested prone 5/5  ?Knee extension 4-/5 5/5  ?Ankle dorsiflexion 3/5 5/5  ?Sit to stand and transfer with mod assist with pt having a posterior lean. ?Sit to supine with mod assist ?Supine to side lying to prone with min assist  ? ?TX:  sit to stand x 5;  ?               Sitting:  LAQ x 10 with manual resistance  ?               Side lying hip abduction x 5 ?  Supine:  bridge x 10  ?                      SLR x 10               Isometric ab/adduction x 10  ?                      Clam x 10 ?                      Dead bug with therapist assist of RT UE x 10  ?               Prone:  Knee flexion, SLR x 10@ ?  ? ?07/13/2021  ?LE ROM:    WFL for tasks assessed ?  ?Active  Right ?07/13/2021 Left ?07/13/2021  ?Hip flexion      ?Hip extension      ?Hip abduction      ?Hip adduction      ?Hip internal rotation      ?Hip external rotation      ?Knee flexion      ?Knee extension      ?Ankle dorsiflexion      ?Ankle plantarflexion      ?Ankle inversion      ?Ankle eversion      ? (Blank rows = not tested) ?  ?MMT:   ?  ?MMT Right ?07/13/2021 Left ?07/13/2021   ?Hip flexion 3-/5 5/5  ?Hip extension      ?Hip abduction      ?Hip adduction      ?Hip internal rotation      ?Hip external rotation      ?Knee flexion 3+/5 5/5  ?Knee extension 4-/5 5/5  ?Ankle dorsiflexion 3/5 5/5  ? ?Ankle plantarflexion      ?Ankle inversion      ?Ankle eversion      ?(Blank rows = not tested) ?  ?  ?TRANSFERS: ?Assistive device utilized: Adult nurse (manual)  ?Sit to stand: Mod A ?Stand to sit: Mod A ?  ?  ?  ?GAIT: ?Gait pattern: ataxic ?Distance walked: 3 feet ?Assistive device utilized:  hemiwalker ?Level of assistance:  min/mod assist ?Comments: unsteady ?  ?  ?TODAY'S TREATMENT:  ?07/25/21: ? Transfer min A (manual to assist with standing balance and instructed to rotate LE) ?Rolling in bed ?Prone: ? Hamstring alternating Lt and Rt (use muscle gun Rt hamstrings) ? Prone SLR 10x  ?Sidelying: Lt forearm pushup to assist with sidelying to sit ?Seated AAROM UE flexion 5x ?Standing:  weight shifting with hemiwalker ?STS 5x (4 with hemiwalker), last set with therapist assistance for SPT to [place]. ? ?  ? ?07/13/21 ?Marching 2x 10  ?LAQ 2x 10 5 Second ?Heel/toe raises 2 x 10 5 second hold ?  ?  ?PATIENT EDUCATION:  ?Education details: Patient educated on exam findings, POC, scope of PT, HEP, and increasing activity. ?Person educated: Patient ?Education method: Explanation, Demonstration, and Handouts ?Education comprehension: verbalized understanding, returned demonstration, verbal cues required, and tactile cues required  ?  ?  ?HOME EXERCISE PROGRAM: ?EValuation:Marching 2x 10  ?                   LAQ 2x 10 5 Second ?                   Heel/toe raises 2 x 10 5 second  hold ?             07/21/2021  Side lying hip abduction x 5 ?               Supine:  bridge x 10  ?                              SLR x 10        ?4/11: sidelying forearm push up exercises  ?  ?  ?GOALS: ?Goals reviewed with patient? Yes ?  ?SHORT TERM GOALS: Target date: 08/03/2021 ?  ?Patient will be independent with HEP  in order to improve functional outcomes. ?Baseline:  ?Goal status: ongoing  ?  ?2.  Patient will report at least 25% improvement in symptoms for improved quality of life. ?Baseline:  ?Goal status:ongoing  ?  ?  ?LONG TERM GOALS: Target date: 08/24/2021 ?  ?Patient will report at least 75% improvement in symptoms for improved quality of life. ?Baseline:  ?Goal status: ongoing  ?  ?2.  Patient will be able to perform sit to stand without UE use in order to demonstrate improved functional strength. ?Baseline:  ?Goal status: ongoing  ?  ?3.  Patient will be able to stand/walk for 5 minutes with LRAD use for balance in order to complete household tasks. ?Baseline:  ?Goal status: ongoing  ?  ?  ?  ?ASSESSMENT: ?  ?CLINICAL IMPRESSION: ?Transfer training with min A with verbal and tactile cueing to improve weight shifting.  Added sidelying push-ups to HEP for Lt UE strengthening to improve sidelying to sit, able to demonstrate independently.  Pt easily fatigued, required period rest breaks through session, c/o hot with fan and water assistance.  Improved hamstring activation with use of gun vibrations.  ?  ?  ?OBJECTIVE IMPAIRMENTS Abnormal gait, decreased activity tolerance, decreased balance, decreased endurance, decreased knowledge of use of DME, decreased mobility, difficulty walking, decreased ROM, decreased strength, impaired flexibility, impaired UE functional use, improper body mechanics, and obesity.  ?  ?ACTIVITY LIMITATIONS cleaning, community activity, driving, meal prep, occupation, laundry, yard work, shopping, and yard work.  ?  ?PERSONAL FACTORS Behavior pattern, Fitness, Past/current experiences, Time since onset of injury/illness/exacerbation, and 3+ comorbidities: CVA, increased BMI, sedentary   are also affecting patient's functional outcome.  ?  ?  ?REHAB POTENTIAL: Good ?  ?CLINICAL DECISION MAKING: Evolving/moderate complexity ?  ?EVALUATION COMPLEXITY: Moderate ?  ?PLAN: ?PT FREQUENCY: 2x/week ?  ?PT  DURATION: 6 weeks ?  ?PLANNED INTERVENTIONS: Therapeutic exercises, Therapeutic activity, Neuromuscular re-education, Balance training, Gait training, Patient/Family education, Joint manipulation, Joint mobil

## 2021-07-27 ENCOUNTER — Encounter (HOSPITAL_COMMUNITY): Payer: 59 | Admitting: Occupational Therapy

## 2021-07-27 ENCOUNTER — Ambulatory Visit (HOSPITAL_COMMUNITY): Payer: 59 | Admitting: Physical Therapy

## 2021-07-27 DIAGNOSIS — M6281 Muscle weakness (generalized): Secondary | ICD-10-CM

## 2021-07-27 DIAGNOSIS — R2689 Other abnormalities of gait and mobility: Secondary | ICD-10-CM

## 2021-07-31 ENCOUNTER — Ambulatory Visit (HOSPITAL_COMMUNITY): Payer: 59

## 2021-07-31 DIAGNOSIS — R2689 Other abnormalities of gait and mobility: Secondary | ICD-10-CM

## 2021-07-31 DIAGNOSIS — M6281 Muscle weakness (generalized): Secondary | ICD-10-CM

## 2021-07-31 NOTE — Therapy (Addendum)
OUTPATIENT PHYSICAL THERAPY TREATMENT NOTE   [name] MRN: [id number] DOB:10-01-1965, 56 y.o., female Today's Date: 07/31/2021  PCP: Toma Deiters, MD REFERRING PROVIDER: Toma Deiters, MD  END OF SESSION:   PT End of Session - 07/31/21 1435     Visit Number 4    Number of Visits 12    Date for PT Re-Evaluation 08/24/21    Authorization Type [employer] (60 VL 1 used, 59 remain)    Authorization - Visit Number 2    Authorization - Number of Visits 59    PT Start Time 1433    PT Stop Time 1525    PT Time Calculation (min) 52 min    Equipment Utilized During Treatment Gait belt    Activity Tolerance Patient tolerated treatment well;Patient limited by fatigue    Behavior During Therapy WFL for tasks assessed/performed              Past Medical History:  Diagnosis Date   Acute heart failure ([facility])    ADD (attention deficit disorder)    Bronchitis    Depression    Diabetes mellitus without complication ([facility])    Elevated troponin    Hypertension    Obesity    Past Surgical History:  Procedure Laterality Date   CESAREAN SECTION     Patient Active Problem List   Diagnosis Date Noted   Chronic combined systolic and diastolic congestive heart failure ([facility]) 08/25/2020   Acute CVA (cerebrovascular accident) ([facility]) 08/24/2020   Borderline prolonged QT interval 08/24/2020   Type 2 diabetes mellitus with complication, without long-term current use of insulin ([facility]) 03/16/2020   Anasarca    Diabetes mellitus ([facility])    Hypokalemia    Vitamin D deficiency    Hypothyroidism    Physical deconditioning    Acute on chronic systolic CHF (congestive heart failure) ([facility]) 03/09/2020   Acute on chronic systolic HF (heart failure) ([facility]) 03/09/2020   Hyperglycemia 12/22/2018   Hypertensive crisis 04/04/2018   Acute CHF (congestive heart failure) ([facility]) 04/04/2018   Mild renal insufficiency 04/04/2018   Elevated troponin 04/04/2018   Bronchitis    Obesity     Seborrheic dermatitis of scalp 05/23/2016   Essential hypertension 12/02/2015   Depression 12/02/2015   Primary insomnia 12/02/2015   Morbid obesity with body mass index of 50.0-59.9 in adult [facility]) 12/02/2015   Attention deficit hyperactivity disorder (ADHD), predominantly inattentive type 12/02/2015    REFERRING DIAG: Z86.73 (ICD-10-CM) - Personal history of transient ischemic attack (TIA), and cerebral infarction without residual deficits     THERAPY DIAG:  Muscle weakness (generalized)  Other abnormalities of gait and mobility  PERTINENT HISTORY:  Essential hypertension;  Hypothyroidism due to Hashimoto's thyroiditis;  CVD (cerebrovascular disease);  History of CVA (cerebrovascular accident);  Type 2 diabetes mellitus with hyperglycemia   PRECAUTIONS: fall   SUBJECTIVE: Pt states that she does not want to get on her stomach today because she got carsick on the way to therapy. She states she is not having any pain today.  PAIN:  Are you having pain? No      DIAGNOSTIC FINDINGS: MRI 08/24/20 IMPRESSION: Acute lacunar type infarct involving the posterior limb of the left internal capsule. Additional small subacute lacunar type infarct involving the left periatrial white matter.             MMT Right 07/13/2021 Left 07/13/2021  Hip flexion 3-/5 5/5  Hip extension  2-    Hip abduction  3-  Hip adduction      Hip internal rotation      Hip external rotation      Knee flexion 2/5tested prone 5/5  Knee extension 4-/5 5/5  Ankle dorsiflexion 3/5 5/5  Sit to stand and transfer with mod assist with pt having a posterior lean. Sit to supine with mod assist Supine to side lying to prone with min assist   TX:  sit to stand x 5;                 Sitting:  LAQ x 10 with manual resistance                 Side lying hip abduction x 5                Supine:  bridge x 10                        SLR x 10               Isometric ab/adduction x 10                        Clam x  10                       Dead bug with therapist assist of RT UE x 10                 Prone:  Knee flexion, SLR x 10@    07/13/2021  LE ROM:    WFL for tasks assessed   Active  Right 07/13/2021 Left 07/13/2021  Hip flexion      Hip extension      Hip abduction      Hip adduction      Hip internal rotation      Hip external rotation      Knee flexion      Knee extension      Ankle dorsiflexion      Ankle plantarflexion      Ankle inversion      Ankle eversion       (Blank rows = not tested)   MMT:     MMT Right 07/13/2021 Left 07/13/2021  Hip flexion 3-/5 5/5  Hip extension      Hip abduction      Hip adduction      Hip internal rotation      Hip external rotation      Knee flexion 3+/5 5/5  Knee extension 4-/5 5/5  Ankle dorsiflexion 3/5 5/5   Ankle plantarflexion      Ankle inversion      Ankle eversion      (Blank rows = not tested)     TRANSFERS: Assistive device utilized: Hemi walker and Wheelchair (manual)  Sit to stand: Mod A Stand to sit: Mod A       GAIT: Gait pattern: ataxic Distance walked: 3 feet Assistive device utilized:  hemiwalker Level of assistance:  min/mod assist Comments: unsteady     TODAY'S TREATMENT:  07/31/21  Transfer min A to mod A (manual to assist with standing balance and instructed to pivot LE)  Seated: Hip flexion x 10 each LAQ x 10 each Leaning to Right with emphasis on pushing back up to upright using Right arm 2 x 10 Ball roll outs with assist for Right hand to keep it open Sit to  stand with hemiwalker 2 x 5 Weight shift with hemi walker x 10 reps Forward Toe tap on the Right in standing with hemiwalker x 2   *declines lying exercise due getting carsick earlier    07/25/21:  Transfer min A (manual to assist with standing balance and instructed to rotate LE) Rolling in bed Prone:  Hamstring alternating Lt and Rt (use muscle gun Rt hamstrings)  Prone SLR 10x  Sidelying: Lt forearm pushup to assist with  sidelying to sit Seated AAROM UE flexion 5x Standing:  weight shifting with hemiwalker STS 5x (4 with hemiwalker), last set with therapist assistance for SPT to WC.     07/13/21 Marching 2x 10  LAQ 2x 10 5 Second Heel/toe raises 2 x 10 5 second hold     PATIENT EDUCATION:  Education details: Patient educated on exam findings, POC, scope of PT, HEP, and increasing activity. Person educated: Patient Education method: Explanation, Demonstration, and Handouts Education comprehension: verbalized understanding, returned demonstration, verbal cues required, and tactile cues required      HOME EXERCISE PROGRAM: EValuation:Marching 2x 10                     LAQ 2x 10 5 Second                    Heel/toe raises 2 x 10 5 second hold              07/21/2021  Side lying hip abduction x 5                Supine:  bridge x 10                                SLR x 10        4/11: sidelying forearm push up exercises      GOALS: Goals reviewed with patient? Yes   SHORT TERM GOALS: Target date: 08/03/2021   Patient will be independent with HEP in order to improve functional outcomes. Baseline:  Goal status: ongoing    2.  Patient will report at least 25% improvement in symptoms for improved quality of life. Baseline:  Goal status:ongoing      LONG TERM GOALS: Target date: 08/24/2021   Patient will report at least 75% improvement in symptoms for improved quality of life. Baseline:  Goal status: ongoing    2.  Patient will be able to perform sit to stand without UE use in order to demonstrate improved functional strength. Baseline:  Goal status: ongoing    3.  Patient will be able to stand/walk for 5 minutes with LRAD use for balance in order to complete household tasks. Baseline:  Goal status: ongoing        ASSESSMENT:   CLINICAL IMPRESSION: Patient declined any lying exercise due to carsickness on the way here so focused on sit to stand and stand pivot transfers and LE  strengthening; sitting and standing balance. Patient able to stand with CGA for safety with hemi walker and weight shift; sometimes has trouble keeping the hemi walker down on its feet but no LOB note. States she wants to return to walking so worked on advancing Right lower extremity and able to perform with decreased control but fatigues quickly and needs frequent rest.  Increased reps of sit to stand; worked on reaching with left hand to reach opposite arm of the WC for stand  pivot transfer and she is able to do so but lacks confidence to do so. Patient will benefit from continued skilled therapy interventions to address deficits and improve functional mobility.     OBJECTIVE IMPAIRMENTS Abnormal gait, decreased activity tolerance, decreased balance, decreased endurance, decreased knowledge of use of DME, decreased mobility, difficulty walking, decreased ROM, decreased strength, impaired flexibility, impaired UE functional use, improper body mechanics, and obesity.    ACTIVITY LIMITATIONS cleaning, community activity, driving, meal prep, occupation, laundry, yard work, shopping, and yard work.    PERSONAL FACTORS Behavior pattern, Fitness, Past/current experiences, Time since onset of injury/illness/exacerbation, and 3+ comorbidities: CVA, increased BMI, sedentary   are also affecting patient's functional outcome.      REHAB POTENTIAL: Good   CLINICAL DECISION MAKING: Evolving/moderate complexity   EVALUATION COMPLEXITY: Moderate   PLAN: PT FREQUENCY: 2x/week   PT DURATION: 6 weeks   PLANNED INTERVENTIONS: Therapeutic exercises, Therapeutic activity, Neuromuscular re-education, Balance training, Gait training, Patient/Family education, Joint manipulation, Joint mobilization, Stair training, Orthotic/Fit training, DME instructions, Aquatic Therapy, Dry Needling, Electrical stimulation, Spinal manipulation, Spinal mobilization, Cryotherapy, Moist heat, Compression bandaging, scar mobilization,  Splintting, Taping, Traction, Ultrasound, Ionotophoresis 4mg /ml Dexamethasone, and Manual therapy     PLAN FOR NEXT SESSION: Continue with strengthening and standing tolerance and pre-gait activities.  try sidestep front of mat with hemiwalker; toe taps to target next session.   3:42 PM, 07/31/21 Anabeth Chilcott Small [place] Blew MPT [facility] [place] [phone]

## 2021-08-04 ENCOUNTER — Ambulatory Visit (HOSPITAL_COMMUNITY): Payer: 59

## 2021-08-04 ENCOUNTER — Encounter (HOSPITAL_COMMUNITY): Payer: Self-pay

## 2021-08-04 DIAGNOSIS — R2689 Other abnormalities of gait and mobility: Secondary | ICD-10-CM

## 2021-08-04 DIAGNOSIS — M6281 Muscle weakness (generalized): Secondary | ICD-10-CM

## 2021-08-04 NOTE — Therapy (Signed)
?OUTPATIENT PHYSICAL THERAPY TREATMENT NOTE ? ? ?[name] ?MRN: [id number] ?DOB:[birth year], 56 y.o., female ?Today's Date: 08/04/2021 ? ?PCP: Neale Burly, MD ?REFERRING PROVIDER: Neale Burly, MD ? ?END OF SESSION:  ? PT End of Session - 08/04/21 1508   ? ? Visit Number 5   ? Number of Visits 12   ? Date for PT Re-Evaluation 08/24/21   ? Authorization Type [employer] (60 VL 1 used, 59 remain)   ? Authorization - Visit Number 5   ? Authorization - Number of Visits 59   ? PT Start Time 1510   ? PT Stop Time [id number]   ? PT Time Calculation (min) 45 min   ? Equipment Utilized During Treatment Gait belt   ? Activity Tolerance Patient tolerated treatment well;Patient limited by fatigue   ? Behavior During Therapy [facility] for tasks assessed/performed   ? ?  ?  ? ?  ? ? ? ?Past Medical History:  ?Diagnosis Date  ? Acute heart failure ([place])   ? ADD (attention deficit disorder)   ? Bronchitis   ? Depression   ? Diabetes mellitus without complication ([place])   ? Elevated troponin   ? Hypertension   ? Obesity   ? ?Past Surgical History:  ?Procedure Laterality Date  ? CESAREAN SECTION    ? ?Patient Active Problem List  ? Diagnosis Date Noted  ? Chronic combined systolic and diastolic congestive heart failure ([place]) 08/25/2020  ? Acute CVA (cerebrovascular accident) ([place]) 08/24/2020  ? Borderline prolonged QT interval 08/24/2020  ? Type 2 diabetes mellitus with complication, without long-term current use of insulin ([place]) 03/16/2020  ? Anasarca   ? Diabetes mellitus ([place])   ? Hypokalemia   ? Vitamin D deficiency   ? Hypothyroidism   ? Physical deconditioning   ? Acute on chronic systolic CHF (congestive heart failure) ([place]) 03/09/2020  ? Acute on chronic systolic HF (heart failure) ([place]) 03/09/2020  ? Hyperglycemia 12/22/2018  ? Hypertensive crisis 04/04/2018  ? Acute CHF (congestive heart failure) ([place]) 04/04/2018  ? Mild renal insufficiency 04/04/2018  ? Elevated troponin 04/04/2018  ? Bronchitis   ? Obesity    ? Seborrheic dermatitis of scalp 05/23/2016  ? Essential hypertension 12/02/2015  ? Depression 12/02/2015  ? Primary insomnia 12/02/2015  ? Morbid obesity with body mass index of 50.0-59.9 in adult [facility]) 12/02/2015  ? Attention deficit hyperactivity disorder (ADHD), predominantly inattentive type 12/02/2015  ? ? ?REFERRING DIAG: Z86.73 (ICD-10-CM) - Personal history of transient ischemic attack (TIA), and cerebral infarction without residual deficits  ?  ? ?THERAPY DIAG:  ?Muscle weakness (generalized) ? ?Other abnormalities of gait and mobility ? ?PERTINENT HISTORY:  Essential hypertension;  ?Hypothyroidism due to Hashimoto's thyroiditis;  ?CVD (cerebrovascular disease);  ?History of CVA (cerebrovascular accident);  ?Type 2 diabetes mellitus with hyperglycemia  ? ?PRECAUTIONS: fall  ? ?SUBJECTIVE: Pt states she is doing well, no car sickness, feeling good.  Has a question about pain in right arm yesterday for an hour, points at region of bracioradialis and states a little pain into back of hand. Denies any today.  ?PAIN:  ?Are you having pain? No  ? ?OBJECTIVE:    ?Italics below from Evaluation on 07/13/2021 ?DIAGNOSTIC FINDINGS: MRI 08/24/20 IMPRESSION: ?Acute lacunar type infarct involving the posterior limb of the left ?internal capsule. Additional small subacute lacunar type infarct ?involving the left periatrial white matter. ? ?           ?MMT Right ?07/13/2021 Left ?07/13/2021  ?  Hip flexion 3-/5 5/5  ?Hip extension  2-    ?Hip abduction  3-    ?Hip adduction      ?Hip internal rotation      ?Hip external rotation      ?Knee flexion 2/5tested prone 5/5  ?Knee extension 4-/5 5/5  ?Ankle dorsiflexion 3/5 5/5  ?Sit to stand and transfer with mod assist with pt having a posterior lean. ?Sit to supine with mod assist ?Supine to side lying to prone with min assist  ? ?TX:  sit to stand x 5;  ?               Sitting:  LAQ x 10 with manual resistance  ?               Side lying hip abduction x 5 ?               Supine:   bridge x 10  ?                      SLR x 10               Isometric ab/adduction x 10  ?                      Clam x 10 ?                      Dead bug with therapist assist of RT UE x 10  ?               Prone:  Knee flexion, SLR x 10@ ?  ? ?07/13/2021  ?LE ROM:    WFL for tasks assessed ?  ?Active  Right ?07/13/2021 Left ?07/13/2021  ?Hip flexion      ?Hip extension      ?Hip abduction      ?Hip adduction      ?Hip internal rotation      ?Hip external rotation      ?Knee flexion      ?Knee extension      ?Ankle dorsiflexion      ?Ankle plantarflexion      ?Ankle inversion      ?Ankle eversion      ? (Blank rows = not tested) ?  ?MMT:   ?  ?MMT Right ?07/13/2021 Left ?07/13/2021  ?Hip flexion 3-/5 5/5  ?Hip extension      ?Hip abduction      ?Hip adduction      ?Hip internal rotation      ?Hip external rotation      ?Knee flexion 3+/5 5/5  ?Knee extension 4-/5 5/5  ?Ankle dorsiflexion 3/5 5/5  ? ?Ankle plantarflexion      ?Ankle inversion      ?Ankle eversion      ?(Blank rows = not tested) ?  ?  ?TRANSFERS: ?Assistive device utilized: Adult nurse (manual)  ?Sit to stand: Mod A ?Stand to sit: Mod A ?  ?  ?  ?GAIT: ?Gait pattern: ataxic ?Distance walked: 3 feet ?Assistive device utilized:  hemiwalker ?Level of assistance:  min/mod assist ?Comments: unsteady ?  ?  ?TODAY'S TREATMENT:  ?08/04/21:   There-Ex ?Transfer with minA sit to stand and step pivot R ?-Sitting = hip shifts side to side x 10 B ? - arms crossed, lumbar rotations x 10 B ?- Supine = CGA for safety sit to sidelying ? -  Right sidelying for left hip abduction clamshell 2 x 10  ? - Supine - hip adduction isometric verse yellow smiley ball 2 x 10  ? - Supine - bridge 2 x 8  ? - Supine - Single knee to chest - transition to piriformis fig 4 stretch 2 x 30 sec B  ? - Left sidelying for right hip abduction clamshell 2 x 10 ? - Left sidelying for AAROM gravity eliminated R shoulder flexion x 10 reps ?- Transition - bed rolling back to right  sidelying for modA to sit edge of bed ?- Sitting =  ?- Big blue Ball roll outs with assist for Right hand to keep it open x 10  ?- Sit to stand with hemiwalker x 5 ?-AAROM R shoulder flexion punch out then up x 10 with minA for available range ?-AAROM R shoulder flexion punch up with maxA to PROM top x 5  ?- Standing = Weight shift with hemi walker x 10 reps ?- Sitting = LAQ with cue for posterior chain opening x 10 B  ?- with last one manual support hold R hamstring stretch 1 x 30 seconds  ?Transfer with minA sit to stand and step pivot L  ? ? ?07/31/21 ? ?Transfer min A to mod A (manual to assist with standing balance and instructed to pivot LE) ? ?Seated: ?Hip flexion x 10 each ?LAQ x 10 each ?Leaning to Right with emphasis on pushing back up to upright using Right arm 2 x 10 ?Ball roll outs with assist for Right hand to keep it open ?Sit to stand with hemiwalker 2 x 5 ?Weight shift with hemi walker x 10 reps ?Forward Toe tap on the Right in standing with hemiwalker x 2 ? ? ?*declines lying exercise due getting carsick earlier ? ? ? ?07/25/21: ? Transfer min A (manual to assist with standing balance and instructed to rotate LE) ?Rolling in bed ?Prone: ? Hamstring alternating Lt and Rt (use muscle gun Rt hamstrings) ? Prone SLR 10x  ?Sidelying: Lt forearm pushup to assist with sidelying to sit ?Seated AAROM UE flexion 5x ?Standing:  weight shifting with hemiwalker ?STS 5x (4 with hemiwalker), last set with therapist assistance for SPT to [place]. ? ?  ? ?07/13/21 ?Marching 2x 10  ?LAQ 2x 10 5 Second ?Heel/toe raises 2 x 10 5 second hold ?  ?  ?PATIENT EDUCATION:  ?Education details: Patient educated on exam findings, POC, scope of PT, HEP, and increasing activity. 08/04/21 = education on nerve tension, nerve and muscle lengthening, clonus reaction and pressure to stop; and mostly focused on education of increased movement for unweighting "wiggle throughout day"  ?Person educated: Patient ?Education method: Explanation,  Demonstration, and Handouts ?Education comprehension: verbalized understanding, returned demonstration, verbal cues required, and tactile cues required  ?  ?  ?HOME EXERCISE PROGRAM: ?EValuation:Marching 2x

## 2021-08-08 ENCOUNTER — Encounter (HOSPITAL_COMMUNITY): Payer: Self-pay

## 2021-08-08 ENCOUNTER — Ambulatory Visit (HOSPITAL_COMMUNITY): Payer: 59

## 2021-08-08 ENCOUNTER — Encounter (HOSPITAL_COMMUNITY): Payer: Self-pay | Admitting: Physical Therapy

## 2021-08-08 ENCOUNTER — Ambulatory Visit (HOSPITAL_COMMUNITY): Payer: 59 | Admitting: Physical Therapy

## 2021-08-08 DIAGNOSIS — M6281 Muscle weakness (generalized): Secondary | ICD-10-CM | POA: Diagnosis not present

## 2021-08-08 DIAGNOSIS — R29818 Other symptoms and signs involving the nervous system: Secondary | ICD-10-CM

## 2021-08-08 DIAGNOSIS — R278 Other lack of coordination: Secondary | ICD-10-CM

## 2021-08-08 DIAGNOSIS — R2689 Other abnormalities of gait and mobility: Secondary | ICD-10-CM

## 2021-08-08 DIAGNOSIS — R29898 Other symptoms and signs involving the musculoskeletal system: Secondary | ICD-10-CM

## 2021-08-08 NOTE — Therapy (Signed)
?OUTPATIENT OCCUPATIONAL THERAPY NEURO EVALUATION ? ?[name] ?MRN: [id number] ?DOB:1965-11-03, 56 y.o., female ?Today's Date: 08/08/2021 ? ?PCP: Toma Deiters, MD ?REFERRING PROVIDER: Vivien Presto, MD ? ? OT End of Session - 08/08/21 1752   ? ? Visit Number 1   ? Number of Visits 24   ? Date for OT Re-Evaluation 10/31/21   ? Authorization Type 1) [employer] 2) [place] medicaid [employer]   ? Authorization Time Period 1) no copay 60 visit limit 2) No auth required for patients over 21 at this time. Adults - 27 visits per calendar year OT/PT/SLP   ? OT Start Time 1430   ? OT Stop Time 1515   ? OT Time Calculation (min) 45 min   ? Activity Tolerance Patient tolerated treatment well   ? Behavior During Therapy [facility] for tasks assessed/performed   ? ?  ?  ? ?  ? ? ?Past Medical History:  ?Diagnosis Date  ? Acute heart failure (H[facility])   ? ADD (attention deficit disorder)   ? Bronchitis   ? Depression   ? Diabetes mellitus without complication ([facility])   ? Elevated troponin   ? Hypertension   ? Obesity   ? ?Past Surgical History:  ?Procedure Laterality Date  ? CESAREAN SECTION    ? ?Patient Active Problem List  ? Diagnosis Date Noted  ? Chronic combined systolic and diastolic congestive heart failure ([facility]) 08/25/2020  ? Acute CVA (cerebrovascular accident) ([facility]) 08/24/2020  ? Borderline prolonged QT interval 08/24/2020  ? Type 2 diabetes mellitus with complication, without long-term current use of insulin ([facility]) 03/16/2020  ? Anasarca   ? Diabetes mellitus ([facility])   ? Hypokalemia   ? Vitamin D deficiency   ? Hypothyroidism   ? Physical deconditioning   ? Acute on chronic systolic CHF (congestive heart failure) ([facility]) 03/09/2020  ? Acute on chronic systolic HF (heart failure) ([facility]) 03/09/2020  ? Hyperglycemia 12/22/2018  ? Hypertensive crisis 04/04/2018  ? Acute CHF (congestive heart failure) ([facility]) 04/04/2018  ? Mild renal insufficiency 04/04/2018  ? Elevated troponin 04/04/2018  ? Bronchitis   ? Obesity   ?  Seborrheic dermatitis of scalp 05/23/2016  ? Essential hypertension 12/02/2015  ? Depression 12/02/2015  ? Primary insomnia 12/02/2015  ? Morbid obesity with body mass index of 50.0-59.9 in adult [facility]) 12/02/2015  ? Attention deficit hyperactivity disorder (ADHD), predominantly inattentive type 12/02/2015  ? ? ?ONSET DATE: 510/22 ? ?REFERRING DIAG: left CVA with right side weakness ? ?THERAPY DIAG:  ?Other symptoms and signs involving the nervous system ? ?Other lack of coordination ? ?Other symptoms and signs involving the musculoskeletal system ? ?SUBJECTIVE:  ? ?SUBJECTIVE STATEMENT: ?S: It was discovered that I have very high blood pressure. ?Pt accompanied by: family member (Mother) ? ?PERTINENT HISTORY: Patient sustained an acute lacunar infarct on the left side causing right side weakness. Patient reports that she received therapy services at [facility] for 2.5 weeks, and HH OT and PT for 1 month. Does not know the date when she sees Dr. Salvadore Farber for a follow-up. She is currently using a borrowed manual wheelchair from [facility] [place] she is unaware when she needs to return it.   ? ?PRECAUTIONS: Fall and Other: right side hemiparesis ? ?WEIGHT BEARING RESTRICTIONS No ? ?PAIN:  ?Are you having pain? No ? ?FALLS: Has patient fallen in last 6 months? No ? ?LIVING ENVIRONMENT: ?Lives with: lives with their family (mother) ?Lives in: House/apartment ?Has following equipment at  home: Dan Humphreys - 4 wheeled, Wheelchair (manual), Grab bars,  ?(Grab bars in shower and at toilet). ? ?PLOF: Independent ? ?PATIENT GOALS Be able to use her right hand better. ? ?OBJECTIVE:  ? ?HAND DOMINANCE: Right ? ?ADLs: ?Overall ADLs: Requires total assist to complete ADL tasks. ?Transfers/ambulation related to ADLs: Patient reports that she is able to ambulate with someone walking behind her with close supervision; no device used. Note: From PT notes, patient requires Min assist with hemi walker and today's session required 2  person assist.  Transfers: min a squat pivot ?Eating: Set-up (using left hand) ?Grooming: Set-up ?UB Dressing: Total A ?LB Dressing: Toilet A ?Toileting: Total A ?Bathing: Sponge bathing sitting on EOB. Total A ?Tub Shower transfers: Not performed at home.  ?Equipment: Grab bars and tub/shower ? ? ? ?MOBILITY STATUS: Needs Assist: With PT in clinic has walked 3 feet max distance with min assist and hemi walker (1-2 person assist) ? ?POSTURE COMMENTS:  ?rounded shoulders, forward head, and flexed trunk  ? ? ?ACTIVITY TOLERANCE: ?Activity tolerance: decreased activity tolerance with fatigue noted during evaluation; especially with transfer.  ? ?FUNCTIONAL OUTCOME MEASURES: ?FOTO: perform at next session. ? ?UE ROM    ? ?Active ROM - seated. IR/er adducted Right ?08/08/2021  ?Shoulder flexion 0  ?Shoulder abduction 50  ?Shoulder internal rotation 60  ?Shoulder external rotation 0 - UE positioning assist  ?Elbow flexion 136  ?Elbow extension -21  ?Wrist flexion  - gravity eliminated 66  ?Wrist extension - gravity eliminated 0  ?Wrist ulnar deviation unable  ?Wrist radial deviation unable  ?Wrist pronation unable  ?Wrist supination 0  ?(Blank rows = not tested) ? ? ?UE MMT:    ? ?MMT - seated. IR/er adducted Right ?08/08/2021  ?Shoulder flexion 3-/5  ?Shoulder abduction 3-/5  ?Shoulder internal rotation 3/5  ?Shoulder external rotation 3-/5  ?Elbow flexion 3/5  ?Elbow extension 3-/5  ?Wrist flexion 3/5  ?Wrist extension 3-/5  ?Wrist ulnar deviation 0/5  ?Wrist radial deviation 0/5  ?Wrist pronation 3-/5  ?Wrist supination 3-/5  ?(Blank rows = not tested) ? ?HAND FUNCTION: ?Grip strength: Right: 3 lbs; Left: 58 lbs and Lateral pinch: Right: 4 lbs, Left: 12 lbs ? ?COORDINATION: ?Unable to test 9 hole peg test ? ?SENSATION: ?Hot/Cold: WFL (pt reports) ?Pt reports no difficulty with proprioception. Will continue to monitor. ? ?EDEMA: Edema noted in right hand ? ?MUSCLE TONE: RUE: Mild, Hypertonic, and right  hand. ? ?COGNITION: ?Overall cognitive status: Within functional limits for tasks assessed. May have decreased deficit awareness based on information reported by patient and reviewing therapy notes of functional performance.  ? ?VISION: ?Subjective report: Reports irritation in left eye today. Woke up this way. Did not discuss any vision deficits.  ?Baseline vision: No visual deficits ? ? ?VISION ASSESSMENT: ?Not tested ? ?Patient has difficulty with following activities due to following visual impairments: N/A ? ?PERCEPTION: Not tested ? ?PRAXIS: Not tested ? ? ? ? ?PATIENT EDUCATION: ?Education details: Discussed treatment plan and patient's goals for therapy.  ?Person educated: Patient and Caregiver ?Education method: Explanation ?Education comprehension: verbalized understanding ? ? ?HOME EXERCISE PROGRAM: ?Will provide HEP at next session. ? ? ? ?GOALS: ? ? ?SHORT TERM GOALS: Target date: 09/19/2021 ? ?Patient/caregiver will be educated on HEP in order to facilitate her progress in therapy and increase her functional performance when completing ADL tasks and move towards more functional use of her RUE.  ?Baseline: ?Goal status: INITIAL ? ?2.  Patient will complete bathing  and dressing tasks at moderate assist level while seated and using a hemi dressing technique.  ?Baseline:  ?Goal status: INITIAL ? ?3.  Patient will increase her RUE A/ROM by 10 degrees where able in order to increase her ability to assist and complete basic ADL tasks.  ?Baseline:  ?Goal status: INITIAL ? ?4.  Patient will demonstrate an increase her right hand coordination in order to complete the 9 hole peg test with assist.  ?Baseline:  ?Goal status: INITIAL ? ?5.  Patient will increase her hand hand grip and pinch strength by 2-3lbs in order to progress towards using her right hand to stabilize items when eating, grooming, etc. ?Baseline:  ?Goal status: INITIAL ? ? ?LONG TERM GOALS: Target date: 10/31/2021 ? ?Patient will complete  bathing,dressing, and grooming tasks at min assist level while seated and using a hemi dressing technique.  ?Baseline:  ?Goal status: INITIAL ? ?2.  Patient will be able to demonstrate a tub/shower transfer at min assist using a tub bench

## 2021-08-08 NOTE — Therapy (Signed)
?OUTPATIENT PHYSICAL THERAPY TREATMENT NOTE ? ? ?[name] ?MRN: [id number] ?DOB:07/09/1965, 56 y.o., female ?Today's Date: 08/08/2021 ? ?PCP: Neale Burly, MD ?REFERRING PROVIDER: Neale Burly, MD ? ?END OF SESSION:  ? PT End of Session - 08/08/21 1515   ? ? Visit Number 6   ? Number of Visits 12   ? Date for PT Re-Evaluation 08/24/21   ? Authorization Type [employer] (60 VL 1 used, 59 remain)   ? Authorization - Visit Number 6   ? Authorization - Number of Visits 59   ? PT Start Time 1535   ? PT Stop Time 1620   ? PT Time Calculation (min) 45 min   ? Equipment Utilized During Treatment Gait belt   ? Activity Tolerance Patient tolerated treatment well;Patient limited by fatigue   ? Behavior During Therapy [facility] for tasks assessed/performed   ? ?  ?  ? ?  ? ? ? ?Past Medical History:  ?Diagnosis Date  ? Acute heart failure ([place])   ? ADD (attention deficit disorder)   ? Bronchitis   ? Depression   ? Diabetes mellitus without complication ([place])   ? Elevated troponin   ? Hypertension   ? Obesity   ? ?Past Surgical History:  ?Procedure Laterality Date  ? CESAREAN SECTION    ? ?Patient Active Problem List  ? Diagnosis Date Noted  ? Chronic combined systolic and diastolic congestive heart failure ([place]) 08/25/2020  ? Acute CVA (cerebrovascular accident) ([place]) 08/24/2020  ? Borderline prolonged QT interval 08/24/2020  ? Type 2 diabetes mellitus with complication, without long-term current use of insulin ([place]) 03/16/2020  ? Anasarca   ? Diabetes mellitus ([place])   ? Hypokalemia   ? Vitamin D deficiency   ? Hypothyroidism   ? Physical deconditioning   ? Acute on chronic systolic CHF (congestive heart failure) ([place]) 03/09/2020  ? Acute on chronic systolic HF (heart failure) ([place]) 03/09/2020  ? Hyperglycemia 12/22/2018  ? Hypertensive crisis 04/04/2018  ? Acute CHF (congestive heart failure) ([place]) 04/04/2018  ? Mild renal insufficiency 04/04/2018  ? Elevated troponin 04/04/2018  ? Bronchitis   ? Obesity    ? Seborrheic dermatitis of scalp 05/23/2016  ? Essential hypertension 12/02/2015  ? Depression 12/02/2015  ? Primary insomnia 12/02/2015  ? Morbid obesity with body mass index of 50.0-59.9 in adult [facility]) 12/02/2015  ? Attention deficit hyperactivity disorder (ADHD), predominantly inattentive type 12/02/2015  ? ? ?REFERRING DIAG: Z86.73 (ICD-10-CM) - Personal history of transient ischemic attack (TIA), and cerebral infarction without residual deficits  ?  ? ?THERAPY DIAG:  ?Muscle weakness (generalized) ? ?Other abnormalities of gait and mobility ? ?PERTINENT HISTORY:  Essential hypertension;  ?Hypothyroidism due to Hashimoto's thyroiditis;  ?CVD (cerebrovascular disease);  ?History of CVA (cerebrovascular accident);  ?Type 2 diabetes mellitus with hyperglycemia  ? ?PRECAUTIONS: fall  ? ?SUBJECTIVE: Pt states that she sat in the chair for to long and her low back is killing her. ?PAIN:  ?Are you having pain? Yes; back 8/10 therapist placed [place] while completing  ?Supine exercises which helped the [place]  ? ?OBJECTIVE:    ?  ?  ?TODAY'S TREATMENT:  ?           08/08/2021 ?                 Standing:  wt shifting; attempt to side step ?                 Prone:  knee flexion x 1o ?                              Hip extensin x 10  ?                              Heel squeeze/ glut squeeze each x 10   ?                 Side lying:  hip abduction x 10 ?                 Supine:  bridge x 10 ?                               Bent knee raise x 10 ?                               Trunk rotation x 10 ?                                Curl x 10  ?                               Hip adduction isometric x 10 ?                 Gait at end of session with hemiwalker 3 ft x 2 with min assist. ? ? ? ? ? ?                08/04/21:   There-Ex ?Transfer with minA sit to stand and step pivot R ?-Sitting = hip shifts side to side x 10 B ? - arms crossed, lumbar rotations x 10 B ?- Supine = CGA for safety sit to sidelying ? - Right sidelying for left  hip abduction clamshell 2 x 10  ? - Supine - hip adduction isometric verse yellow smiley ball 2 x 10  ? - Supine - bridge 2 x 8  ? - Supine - Single knee to chest - transition to piriformis fig 4 stretch 2 x 30 sec B  ? - Left sidelying for right hip abduction clamshell 2 x 10 ? - Left sidelying for AAROM gravity eliminated R shoulder flexion x 10 reps ?- Transition - bed rolling back to right sidelying for modA to sit edge of bed ?- Sitting =  ?- Big blue Ball roll outs with assist for Right hand to keep it open x 10  ?- Sit to stand with hemiwalker x 5 ?-AAROM R shoulder flexion punch out then up x 10 with minA for available range ?-AAROM R shoulder flexion punch up with maxA to PROM top x 5  ?- Standing = Weight shift with hemi walker x 10 reps ?- Sitting = LAQ with cue for posterior chain opening x 10 B  ?- with last one manual support hold R hamstring stretch 1 x 30 seconds  ?Transfer with minA sit to stand and step pivot L  ? ? ?07/31/21 ? ?Transfer min A to mod A (manual to assist with standing balance and instructed to pivot LE) ? ?Seated: ?Hip flexion x 10 each ?  LAQ x 10 each ?Leaning to Right with emphasis on pushing back up to upright using Right arm 2 x 10 ?Ball roll outs with assist for Right hand to keep it open ?Sit to stand with hemiwalker 2 x 5 ?Weight shift with hemi walker x 10 reps ?Forward Toe tap on the Right in standing with hemiwalker x 2 ? ? ?*declines lying exercise due getting carsick earlier ? ? ? ?07/25/21: ? Transfer min A (manual to assist with standing balance and instructed to rotate LE) ?Rolling in bed ?Prone: ? Hamstring alternating Lt and Rt (use muscle gun Rt hamstrings) ? Prone SLR 10x  ?Sidelying: Lt forearm pushup to assist with sidelying to sit ?Seated AAROM UE flexion 5x ?Standing:  weight shifting with hemiwalker ?STS 5x (4 with hemiwalker), last set with therapist assistance for SPT to [place]. ? ?  ? ?07/13/21 ?Marching 2x 10  ?LAQ 2x 10 5 Second ?Heel/toe raises 2 x 10 5 second  hold ? Italics below from Evaluation on 07/13/2021 ?DIAGNOSTIC FINDINGS: MRI 08/24/20 IMPRESSION: ?Acute lacunar type infarct involving the posterior limb of the left ?internal capsule. Additional small subacute lacunar type infarct ?involving the left periatrial white matter. ? ?           ?MMT Right ?07/13/2021 Left ?07/13/2021  ?Hip flexion 3-/5 5/5  ?Hip extension  2-    ?Hip abduction  3-    ?Hip adduction      ?Hip internal rotation      ?Hip external rotation      ?Knee flexion 2/5tested prone 5/5  ?Knee extension 4-/5 5/5  ?Ankle dorsiflexion 3/5 5/5  ?Sit to stand and transfer with mod assist with pt having a posterior lean. ?Sit to supine with mod assist ?Supine to side lying to prone with min assist  ? ?TX:  sit to stand x 5;  ?               Sitting:  LAQ x 10 with manual resistance  ?               Side lying hip abduction x 5 ?               Supine:  bridge x 10  ?                      SLR x 10               Isometric ab/adduction x 10  ?                      Clam x 10 ?                      Dead bug with therapist assist of RT UE x 10  ?               Prone:  Knee flexion, SLR x 10@ ?  ? ?07/13/2021  ?LE ROM:    WFL for tasks assessed ?  ?Active  Right ?07/13/2021 Left ?07/13/2021  ?Hip flexion      ?Hip extension      ?Hip abduction      ?Hip adduction      ?Hip internal rotation      ?Hip external rotation      ?Knee flexion      ?Knee extension      ?Ankle dorsiflexion      ?Ankle plantarflexion      ?  Ankle inversion      ?Ankle eversion      ? (Blank rows = not tested) ?  ?MMT:   ?  ?MMT Right ?07/13/2021 Left ?07/13/2021  ?Hip flexion 3-/5 5/5  ?Hip extension      ?Hip abduction      ?Hip adduction      ?Hip internal rotation      ?Hip external rotation      ?Knee flexion 3+/5 5/5  ?Knee extension 4-/5 5/5  ?Ankle dorsiflexion 3/5 5/5  ? ?Ankle plantarflexion      ?Ankle inversion      ?Ankle eversion      ?(Blank rows = not tested) ?  ?  ?TRANSFERS: ?Assistive device utilized: Adult nurse  (manual)  ?Sit to stand: Mod A ?Stand to sit: Mod A ?  ?  ?  ?GAIT: ?Gait pattern: ataxic ?Distance walked: 3 feet ?Assistive device utilized:  hemiwalker ?Level of assistance:  min/mod assist ?Comment

## 2021-08-10 ENCOUNTER — Ambulatory Visit (HOSPITAL_COMMUNITY): Payer: 59

## 2021-08-10 ENCOUNTER — Encounter (HOSPITAL_COMMUNITY): Payer: Self-pay

## 2021-08-10 DIAGNOSIS — R2689 Other abnormalities of gait and mobility: Secondary | ICD-10-CM

## 2021-08-10 DIAGNOSIS — R29898 Other symptoms and signs involving the musculoskeletal system: Secondary | ICD-10-CM

## 2021-08-10 DIAGNOSIS — R278 Other lack of coordination: Secondary | ICD-10-CM

## 2021-08-10 DIAGNOSIS — R29818 Other symptoms and signs involving the nervous system: Secondary | ICD-10-CM

## 2021-08-10 DIAGNOSIS — M6281 Muscle weakness (generalized): Secondary | ICD-10-CM

## 2021-08-10 NOTE — Therapy (Signed)
OUTPATIENT PHYSICAL THERAPY TREATMENT NOTE   [name] MRN: [id number] DOB:July 22, 1965, 56 y.o., female Today's Date: 08/10/2021  PCP: Toma Deiters, MD REFERRING PROVIDER: Toma Deiters, MD  END OF SESSION:   PT End of Session - 08/10/21 1356     Visit Number 7    Number of Visits 12    Date for PT Re-Evaluation 08/24/21    Authorization Type [employer] (60 VL 1 used, 59 remain)    Authorization - Visit Number 7    Authorization - Number of Visits 59    PT Start Time 1351    PT Stop Time 1430    PT Time Calculation (min) 39 min    Equipment Utilized During Treatment Gait belt    Activity Tolerance Patient tolerated treatment well;Patient limited by fatigue    Behavior During Therapy WFL for tasks assessed/performed              Past Medical History:  Diagnosis Date   Acute heart failure (H[facility])    ADD (attention deficit disorder)    Bronchitis    Depression    Diabetes mellitus without complication ([facility])    Elevated troponin    Hypertension    Obesity    Past Surgical History:  Procedure Laterality Date   CESAREAN SECTION     Patient Active Problem List   Diagnosis Date Noted   Chronic combined systolic and diastolic congestive heart failure ([facility]) 08/25/2020   Acute CVA (cerebrovascular accident) ([facility]) 08/24/2020   Borderline prolonged QT interval 08/24/2020   Type 2 diabetes mellitus with complication, without long-term current use of insulin ([facility]) 03/16/2020   Anasarca    Diabetes mellitus ([facility])    Hypokalemia    Vitamin D deficiency    Hypothyroidism    Physical deconditioning    Acute on chronic systolic CHF (congestive heart failure) ([facility]) 03/09/2020   Acute on chronic systolic HF (heart failure) ([facility]) 03/09/2020   Hyperglycemia 12/22/2018   Hypertensive crisis 04/04/2018   Acute CHF (congestive heart failure) ([facility]) 04/04/2018   Mild renal insufficiency 04/04/2018   Elevated troponin 04/04/2018   Bronchitis    Obesity     Seborrheic dermatitis of scalp 05/23/2016   Essential hypertension 12/02/2015   Depression 12/02/2015   Primary insomnia 12/02/2015   Morbid obesity with body mass index of 50.0-59.9 in adult [facility]) 12/02/2015   Attention deficit hyperactivity disorder (ADHD), predominantly inattentive type 12/02/2015    REFERRING DIAG: Z86.73 (ICD-10-CM) - Personal history of transient ischemic attack (TIA), and cerebral infarction without residual deficits     THERAPY DIAG:  Other symptoms and signs involving the nervous system  Other abnormalities of gait and mobility  Other lack of coordination  Other symptoms and signs involving the musculoskeletal system  Muscle weakness (generalized)  PERTINENT HISTORY:  Essential hypertension;  Hypothyroidism due to Hashimoto's thyroiditis;  CVD (cerebrovascular disease);  History of CVA (cerebrovascular accident);  Type 2 diabetes mellitus with hyperglycemia   PRECAUTIONS: fall   SUBJECTIVE: No pain today on patient arrival.  PAIN:  Are you having pain? No  OBJECTIVE:        TODAY'S TREATMENT:  08/10/21 Supine:    bridge x 10                                 Bent knee raise x 10  LTR x 10                                  Hip adduction isometric x 10 with ball  Prone:     Prone on elbows    Prone glute sets x 10    Prone hip extension x 5 each  Sit to stand from the mat and ambulation with hemiwalker and min A x 2 x 4 ft with WC following; brief seated rest and then ambulation another 3 ft with hemiwalker and min A x 2 with WC following.                 08/08/2021                  Standing:  wt shifting; attempt to side step                  Prone:  knee flexion x 1o                               Hip extensin x 10                                Heel squeeze/ glut squeeze each x 10                    Side lying:  hip abduction x 10                  Supine:  bridge x 10                                 Bent knee raise x 10                                Trunk rotation x 10                                 Curl x 10                                 Hip adduction isometric x 10                  Gait at end of session with hemiwalker 3 ft x 2 with min assist.                      08/04/21:   There-Ex Transfer with minA sit to stand and step pivot R -Sitting = hip shifts side to side x 10 B  - arms crossed, lumbar rotations x 10 B - Supine = CGA for safety sit to sidelying  - Right sidelying for left hip abduction clamshell 2 x 10   - Supine - hip adduction isometric verse yellow smiley ball 2 x 10   - Supine - bridge 2 x 8   - Supine - Single knee to chest - transition to piriformis fig 4 stretch 2 x 30 sec B   - Left sidelying for right hip abduction clamshell  2 x 10  - Left sidelying for AAROM gravity eliminated R shoulder flexion x 10 reps - Transition - bed rolling back to right sidelying for modA to sit edge of bed - Sitting =  - Big blue Ball roll outs with assist for Right hand to keep it open x 10  - Sit to stand with hemiwalker x 5 -AAROM R shoulder flexion punch out then up x 10 with minA for available range -AAROM R shoulder flexion punch up with maxA to PROM top x 5  - Standing = Weight shift with hemi walker x 10 reps - Sitting = LAQ with cue for posterior chain opening x 10 B  - with last one manual support hold R hamstring stretch 1 x 30 seconds  Transfer with minA sit to stand and step pivot L    07/31/21  Transfer min A to mod A (manual to assist with standing balance and instructed to pivot LE)  Seated: Hip flexion x 10 each LAQ x 10 each Leaning to Right with emphasis on pushing back up to upright using Right arm 2 x 10 Ball roll outs with assist for Right hand to keep it open Sit to stand with hemiwalker 2 x 5 Weight shift with hemi walker x 10 reps Forward Toe tap on the Right in standing with hemiwalker x 2   *declines lying exercise due getting carsick  earlier    07/13/21 Marching 2x 10  LAQ 2x 10 5 Second Heel/toe raises 2 x 10 5 second hold  Italics below from Evaluation on 07/13/2021 DIAGNOSTIC FINDINGS: MRI 08/24/20 IMPRESSION: Acute lacunar type infarct involving the posterior limb of the left internal capsule. Additional small subacute lacunar type infarct involving the left periatrial white matter.             MMT Right 07/13/2021 Left 07/13/2021  Hip flexion 3-/5 5/5  Hip extension  2-    Hip abduction  3-    Hip adduction      Hip internal rotation      Hip external rotation      Knee flexion 2/5tested prone 5/5  Knee extension 4-/5 5/5  Ankle dorsiflexion 3/5 5/5  Sit to stand and transfer with mod assist with pt having a posterior lean. Sit to supine with mod assist Supine to side lying to prone with min assist   TX:  sit to stand x 5;                 Sitting:  LAQ x 10 with manual resistance                 Side lying hip abduction x 5                Supine:  bridge x 10                        SLR x 10               Isometric ab/adduction x 10                        Clam x 10                       Dead bug with therapist assist of RT UE x 10                 Prone:  Knee flexion, SLR x 10@    07/13/2021  LE ROM:    WFL for tasks assessed   Active  Right 07/13/2021 Left 07/13/2021  Hip flexion      Hip extension      Hip abduction      Hip adduction      Hip internal rotation      Hip external rotation      Knee flexion      Knee extension      Ankle dorsiflexion      Ankle plantarflexion      Ankle inversion      Ankle eversion       (Blank rows = not tested)   MMT:     MMT Right 07/13/2021 Left 07/13/2021  Hip flexion 3-/5 5/5  Hip extension      Hip abduction      Hip adduction      Hip internal rotation      Hip external rotation      Knee flexion 3+/5 5/5  Knee extension 4-/5 5/5  Ankle dorsiflexion 3/5 5/5   Ankle plantarflexion      Ankle inversion      Ankle eversion      (Blank  rows = not tested)     TRANSFERS: Assistive device utilized: Hemi walker and Wheelchair (manual)  Sit to stand: Mod A Stand to sit: Mod A       GAIT: Gait pattern: ataxic Distance walked: 3 feet Assistive device utilized:  hemiwalker Level of assistance:  min/mod assist Comments: unsteady   PATIENT EDUCATION:  Education details: Patient educated on exam findings, POC, scope of PT, HEP, and increasing activity. 08/04/21 = education on nerve tension, nerve and muscle lengthening, clonus reaction and pressure to stop; and mostly focused on education of increased movement for unweighting "wiggle throughout day"  Person educated: Patient Education method: Explanation, Demonstration, and Handouts Education comprehension: verbalized understanding, returned demonstration, verbal cues required, and tactile cues required      HOME EXERCISE PROGRAM: EValuation:Marching 2x 10                     LAQ 2x 10 5 Second                    Heel/toe raises 2 x 10 5 second hold              07/21/2021  Side lying hip abduction x 5                Supine:  bridge x 10                                SLR x 10        4/11: sidelying forearm push up exercises      GOALS: Goals reviewed with patient? Yes   SHORT TERM GOALS: Target date: 08/03/2021   Patient will be independent with HEP in order to improve functional outcomes. Baseline:  Goal status: ongoing    2.  Patient will report at least 25% improvement in symptoms for improved quality of life. Baseline:  Goal status:ongoing      LONG TERM GOALS: Target date: 08/24/2021   Patient will report at least 75% improvement in symptoms for improved quality of life. Baseline:  Goal status: ongoing    2.  Patient will be able to perform sit to stand without  UE use in order to demonstrate improved functional strength. Baseline:  Goal status: ongoing    3.  Patient will be able to stand/walk for 5 minutes with LRAD use for balance in order to  complete household tasks. Baseline:  Goal status: ongoing        ASSESSMENT:   CLINICAL IMPRESSION: Today's session continued to focus on overall mobility; bed mobility, SPT, sit to stand transfer and ambulation with hemiwalker.  Patient performs SPT from [facility] to mat with verbal cues and CGA for safety with gait belt.  She can sit on Edge of mat with Supervision and good balance.  Needs min A for legs with sit to supine and performs supine to prone with extra time but SBA from therapist only.  Patient tends to groan quite a bit throughout treatment but is able to walk further today with min A x 2; she states her legs feel numb and needs verbal cues for correct sequencing and to reach back for the chair when returning to sit. Patient will benefit from continued skilled therapy interventions to address deficits and improve functional mobility.    OBJECTIVE IMPAIRMENTS Abnormal gait, decreased activity tolerance, decreased balance, decreased endurance, decreased knowledge of use of DME, decreased mobility, difficulty walking, decreased ROM, decreased strength, impaired flexibility, impaired UE functional use, improper body mechanics, and obesity.    ACTIVITY LIMITATIONS cleaning, community activity, driving, meal prep, occupation, laundry, yard work, shopping, and yard work.    PERSONAL FACTORS Behavior pattern, Fitness, Past/current experiences, Time since onset of injury/illness/exacerbation, and 3+ comorbidities: CVA, increased BMI, sedentary   are also affecting patient's functional outcome.      REHAB POTENTIAL: Good   CLINICAL DECISION MAKING: Evolving/moderate complexity   EVALUATION COMPLEXITY: Moderate   PLAN: PT FREQUENCY: 2x/week   PT DURATION: 6 weeks   PLANNED INTERVENTIONS: Therapeutic exercises, Therapeutic activity, Neuromuscular re-education, Balance training, Gait training, Patient/Family education, Joint manipulation, Joint mobilization, Stair training, Orthotic/Fit  training, DME instructions, Aquatic Therapy, Dry Needling, Electrical stimulation, Spinal manipulation, Spinal mobilization, Cryotherapy, Moist heat, Compression bandaging, scar mobilization, Splintting, Taping, Traction, Ultrasound, Ionotophoresis 4mg /ml Dexamethasone, and Manual therapy     PLAN FOR NEXT SESSION: Continue with strengthening and standing tolerance trial of walking with rolling walker with platform for Right arm.   4:00 PM, 08/10/21 Anakaren Campion Small Kazumi Lachney MPT [facility] [place] [phone] Ph:[phone]

## 2021-08-10 NOTE — Patient Instructions (Signed)
Your Splint This splint should initially be fitted by a healthcare practitioner.  The healthcare practitioner is responsible for providing wearing instructions and precautions to the patient, other healthcare practitioners and care provider involved in the patient's care.  This splint was custom made for you. Please read the following instructions to learn about wearing and caring for your splint.  Precautions Should your splint cause any of the following problems, remove the splint immediately and contact your therapist/physician. Swelling Severe Pain Pressure Areas Stiffness Numbness  Do not wear your splint while operating machinery unless it has been fabricated for that purpose.  When To Wear Your Splint Where your splint according to your therapist/physician instructions. Nights and rest periods only  Care and Cleaning of Your Splint Keep your splint away from open flames. Your splint will lose its shape in temperatures over 135 degrees Farenheit, ( in car windows, near radiators, ovens or in hot water).  Never make any adjustments to your splint, if the splint needs adjusting remove it and make an appointment to see your therapist. Your splint, including the cushion liner may be cleaned with soap and lukewarm water.  Do not immerse in hot water over 135 degrees Farenheit. Straps may be washed with soap and water, but do not moisten the self-adhesive portion. For ink or hard to remove spots use a scouring cleanser which contains chlorine.  Rinse the splint thoroughly after using chlorine cleanser.  

## 2021-08-10 NOTE — Therapy (Signed)
?OUTPATIENT OCCUPATIONAL THERAPY TREATMENT NOTE ? ? ?[name] ?MRN: [id number] ?DOB:[birth year], 56 y.o., female ?Today's Date: 08/10/2021 ? ?PCP: Toma Deiters, MD ?REFERRING PROVIDER: Delano Metz, MD ? ?END OF SESSION:  ? OT End of Session - 08/10/21 1528   ? ? Visit Number 2   ? Number of Visits 24   ? Date for OT Re-Evaluation 10/31/21   ? Authorization Type 1) [employer] 2) [place] medicaid [employer]   ? Authorization Time Period 1) no copay 60 visit limit  2) No auth required for patients over 21 at this time. Adults - 27 visits per calendar year OT/PT/SLP   ? Authorization - Visit Number 2   ? Authorization - Number of Visits 60   ? OT Start Time 1433   ? OT Stop Time 1520   ? OT Time Calculation (min) 47 min   ? Activity Tolerance Patient tolerated treatment well   ? Behavior During Therapy [facility] for tasks assessed/performed   ? ?  ?  ? ?  ? ? ?Past Medical History:  ?Diagnosis Date  ? Acute heart failure (HCC)   ? ADD (attention deficit disorder)   ? Bronchitis   ? Depression   ? Diabetes mellitus without complication (HCC)   ? Elevated troponin   ? Hypertension   ? Obesity   ? ?Past Surgical History:  ?Procedure Laterality Date  ? CESAREAN SECTION    ? ?Patient Active Problem List  ? Diagnosis Date Noted  ? Chronic combined systolic and diastolic congestive heart failure (HCC) 08/25/2020  ? Acute CVA (cerebrovascular accident) (HCC) 08/24/2020  ? Borderline prolonged QT interval 08/24/2020  ? Type 2 diabetes mellitus with complication, without long-term current use of insulin (HCC) 03/16/2020  ? Anasarca   ? Diabetes mellitus (HCC)   ? Hypokalemia   ? Vitamin D deficiency   ? Hypothyroidism   ? Physical deconditioning   ? Acute on chronic systolic CHF (congestive heart failure) (HCC) 03/09/2020  ? Acute on chronic systolic HF (heart failure) (HCC) 03/09/2020  ? Hyperglycemia 12/22/2018  ? Hypertensive crisis 04/04/2018  ? Acute CHF (congestive heart failure) (HCC) 04/04/2018  ? Mild renal  insufficiency 04/04/2018  ? Elevated troponin 04/04/2018  ? Bronchitis   ? Obesity   ? Seborrheic dermatitis of scalp 05/23/2016  ? Essential hypertension 12/02/2015  ? Depression 12/02/2015  ? Primary insomnia 12/02/2015  ? Morbid obesity with body mass index of 50.0-59.9 in adult [facility]) 12/02/2015  ? Attention deficit hyperactivity disorder (ADHD), predominantly inattentive type 12/02/2015  ? ? ?ONSET DATE: 08/23/20 ? ?REFERRING DIAG: Left CVA with right side weakness ? ?THERAPY DIAG:  ?Other lack of coordination ? ?Other symptoms and signs involving the nervous system ? ?Other symptoms and signs involving the musculoskeletal system ? ? ?PERTINENT HISTORY: Patient sustained an acute lacunar infarct on the left side causing right side weakness. Patient reports that she received therapy services at [facility] for 2.5 weeks, and HH OT and PT for 1 month. Does not know the date when she sees Dr. Salvadore Farber for a follow-up. She is currently using a borrowed manual wheelchair from [facility], [place] she is unaware when she needs to return it.  ? ?PRECAUTIONS: Fall and Other: right side hemiparesis ? ?SUBJECTIVE: S: Nothing new to report. Legs continue to go numb while seated in wheelchair.  ? ?PAIN:  ?Are you having pain?  No number provided during session.  ? ? ? ? ?OBJECTIVE:  ?UE ROM    ?  ?  Active ROM - seated. IR/er adducted Right ?08/08/2021  ?Shoulder flexion 0  ?Shoulder abduction 50  ?Shoulder internal rotation 60  ?Shoulder external rotation 0 - UE positioning assist  ?Elbow flexion 136  ?Elbow extension -21  ?Wrist flexion  - gravity eliminated 66  ?Wrist extension - gravity eliminated 0  ?Wrist ulnar deviation unable  ?Wrist radial deviation unable  ?Wrist pronation unable  ?Wrist supination 0  ?(Blank rows = not tested) ?  ?  ?UE MMT:    ?  ?MMT - seated. IR/er adducted Right ?08/08/2021  ?Shoulder flexion 3-/5  ?Shoulder abduction 3-/5  ?Shoulder internal rotation 3/5  ?Shoulder external rotation 3-/5   ?Elbow flexion 3/5  ?Elbow extension 3-/5  ?Wrist flexion 3/5  ?Wrist extension 3-/5  ?Wrist ulnar deviation 0/5  ?Wrist radial deviation 0/5  ?Wrist pronation 3-/5  ?Wrist supination 3-/5  ?(Blank rows = not tested) ?  ?HAND FUNCTION: ?Grip strength: Right: 3 lbs; Left: 58 lbs and Lateral pinch: Right: 4 lbs, Left: 12 lbs ?  ?COORDINATION: ?Unable to test 9 hole peg test ? ?GOALS: ?  ?  ?SHORT TERM GOALS: Target date: 09/19/2021 ?  ?Patient/caregiver will be educated on HEP in order to facilitate her progress in therapy and increase her functional performance when completing ADL tasks and move towards more functional use of her RUE.  ?Baseline: ?Goal status: on-going ?  ?2.  Patient will complete bathing and dressing tasks at moderate assist level while seated and using a hemi dressing technique.  ?Baseline:  ?Goal status: on-going ?  ?3.  Patient will increase her RUE A/ROM by 10 degrees where able in order to increase her ability to assist and complete basic ADL tasks.  ?Baseline:  ?Goal status:on-going ?  ?4.  Patient will demonstrate an increase her right hand coordination in order to complete the 9 hole peg test with assist.  ?Baseline:  ?Goal status:on-going ?  ?5.  Patient will increase her hand hand grip and pinch strength by 2-3lbs in order to progress towards using her right hand to stabilize items when eating, grooming, etc. ?Baseline:  ?Goal status: on-going ?  ?  ?LONG TERM GOALS: Target date: 10/31/2021 ?  ?Patient will complete bathing,dressing, and grooming tasks at min assist level while seated and using a hemi dressing technique.  ?Baseline:  ?Goal status:on-going ?  ?2.  Patient will be able to demonstrate a tub/shower transfer at min assist using a tub bench in order to return to taking shower with caregiver assisting. ?Baseline:  ?Goal status: on-going ?  ?3.  Patient will increase her RUE A/ROM in order to use her right arm/hand as an active assist when completing dressing, bathing, and grooming  tasks.  ?Baseline:  ?Goal status: on-going ?  ?4.  Patient will demonstrate an  increase her right hand coordination by completing the 9 hole peg test without assistance. ?Baseline:  ?Goal status: on-going ?  ?5.  Patient will increase her right hand grip and pinch strength to 10% of average norms for age in order to progress her ability to use her hand as an active assist.  ?Baseline:  ?Goal status: on-going ? ? ?PATIENT EDUCATION: ?Education details: Splint education provided regarding care management, donning/doffing techniques, precautions, wearing schedule ?Person educated: Patient and Caregiver ?Education method: Explanation, verbalized, demonstrated, handout ?Education comprehension: verbalized understanding ?  ?  ?HOME EXERCISE PROGRAM: ? ? ?TODAY'S TREATMENT: ?08/10/21 ?- Splint fabrication: Custom fabricated composite finger extension splint for right hand to wear at night. Three  2 inch straps used to secure forearm and MCP joints. One 1 inch strap applied to secure thumb. Four sockette's provided.  ? ? ?  ? ? ?CLINICAL IMPRESSION: ?P: Due to increased spacticity developing in right UE fingers, custom fabricated night time finger extension splint was provided. Reviewed all education with patient and mother. During session, discussed interest and benefits of receiving own personal wheelchair to provide proper support and positioning with OT offering to contact mobility specialist to start process. Patient verbalized interest. ? ? ?PLAN: ?OT FREQUENCY: 2x/week ?  ?OT DURATION: 12 weeks ?  ?PLANNED INTERVENTIONS: self care/ADL training, therapeutic exercise, therapeutic activity, neuromuscular re-education, manual therapy, passive range of motion, balance training, splinting, electrical stimulation, ultrasound, moist heat, cryotherapy, patient/family education, visual/perceptual remediation/compensation, psychosocial skills training, energy conservation, coping strategies training, and DME and/or AE  instructions ?  ?RECOMMENDED OTHER SERVICES: Manual wheelchair evaluation from mobility specialist ?  ?CONSULTED AND AGREED WITH PLAN OF CARE: Patient and family member/caregiver ?  ?PLAN FOR NEXT SESSION: P: Compl

## 2021-08-15 ENCOUNTER — Ambulatory Visit (HOSPITAL_COMMUNITY): Payer: 59 | Attending: Family Medicine | Admitting: Physical Therapy

## 2021-08-15 ENCOUNTER — Encounter (HOSPITAL_COMMUNITY): Payer: 59 | Admitting: Occupational Therapy

## 2021-08-15 ENCOUNTER — Encounter (HOSPITAL_COMMUNITY): Payer: Self-pay | Admitting: Physical Therapy

## 2021-08-15 DIAGNOSIS — M6281 Muscle weakness (generalized): Secondary | ICD-10-CM | POA: Insufficient documentation

## 2021-08-15 DIAGNOSIS — R278 Other lack of coordination: Secondary | ICD-10-CM | POA: Diagnosis present

## 2021-08-15 DIAGNOSIS — R29818 Other symptoms and signs involving the nervous system: Secondary | ICD-10-CM | POA: Diagnosis present

## 2021-08-15 DIAGNOSIS — R29898 Other symptoms and signs involving the musculoskeletal system: Secondary | ICD-10-CM | POA: Diagnosis present

## 2021-08-15 DIAGNOSIS — R2689 Other abnormalities of gait and mobility: Secondary | ICD-10-CM | POA: Insufficient documentation

## 2021-08-15 NOTE — Therapy (Signed)
?OUTPATIENT PHYSICAL THERAPY TREATMENT NOTE ? ? ?[name] ?MRN: [id number] ?DOB:06/04/1965, 56 y.o., female ?Today's Date: 08/15/2021 ? ?PCP: Toma Deiters, MD ?REFERRING PROVIDER: Toma Deiters, MD ? ?END OF SESSION:  ? PT End of Session - 08/15/21 1406   ? ? Visit Number 8   ? Number of Visits 12   ? Date for PT Re-Evaluation 08/24/21   ? Authorization Type [employer] (60 VL 1 used, 59 remain)   ? Authorization - Visit Number 8   ? Authorization - Number of Visits 59   ? PT Start Time 1406   ? PT Stop Time 1445   ? PT Time Calculation (min) 39 min   ? Equipment Utilized During Treatment Gait belt   ? Activity Tolerance Patient tolerated treatment well;Patient limited by fatigue   ? Behavior During Therapy [facility] for tasks assessed/performed   ? ?  ?  ? ?  ? ? ? ?Past Medical History:  ?Diagnosis Date  ? Acute heart failure (HCC)   ? ADD (attention deficit disorder)   ? Bronchitis   ? Depression   ? Diabetes mellitus without complication (HCC)   ? Elevated troponin   ? Hypertension   ? Obesity   ? ?Past Surgical History:  ?Procedure Laterality Date  ? CESAREAN SECTION    ? ?Patient Active Problem List  ? Diagnosis Date Noted  ? Chronic combined systolic and diastolic congestive heart failure (HCC) 08/25/2020  ? Acute CVA (cerebrovascular accident) (HCC) 08/24/2020  ? Borderline prolonged QT interval 08/24/2020  ? Type 2 diabetes mellitus with complication, without long-term current use of insulin (HCC) 03/16/2020  ? Anasarca   ? Diabetes mellitus (HCC)   ? Hypokalemia   ? Vitamin D deficiency   ? Hypothyroidism   ? Physical deconditioning   ? Acute on chronic systolic CHF (congestive heart failure) (HCC) 03/09/2020  ? Acute on chronic systolic HF (heart failure) (HCC) 03/09/2020  ? Hyperglycemia 12/22/2018  ? Hypertensive crisis 04/04/2018  ? Acute CHF (congestive heart failure) (HCC) 04/04/2018  ? Mild renal insufficiency 04/04/2018  ? Elevated troponin 04/04/2018  ? Bronchitis   ? Obesity    ? Seborrheic dermatitis of scalp 05/23/2016  ? Essential hypertension 12/02/2015  ? Depression 12/02/2015  ? Primary insomnia 12/02/2015  ? Morbid obesity with body mass index of 50.0-59.9 in adult [facility]) 12/02/2015  ? Attention deficit hyperactivity disorder (ADHD), predominantly inattentive type 12/02/2015  ? ? ?REFERRING DIAG: Z86.73 (ICD-10-CM) - Personal history of transient ischemic attack (TIA), and cerebral infarction without residual deficits  ?  ? ?THERAPY DIAG:  ?Other abnormalities of gait and mobility ? ?Muscle weakness (generalized) ? ?PERTINENT HISTORY:  Essential hypertension;  ?Hypothyroidism due to Hashimoto's thyroiditis;  ?CVD (cerebrovascular disease);  ?History of CVA (cerebrovascular accident);  ?Type 2 diabetes mellitus with hyperglycemia  ? ?PRECAUTIONS: fall  ? ?SUBJECTIVE: Been walking about the same as home. No falls ?PAIN:  ?Are you having pain? No ? ?OBJECTIVE:    ?  ?  ?TODAY'S TREATMENT:  ? ?08/15/21 ?Transfer to standing with hemi walker x2 with min A with 3 feet ambulation to table min A ?Bridge 2x10  ?March 2x 10 bilateral  ?Seated pelvic tilts 2x 20  ?Seated lateral pelvic tilts 2x 20  ?Lateral scooting 2 x 4 feet ?Transfer to standing with hemiwalker x1 with min A with 2 feet ambulation with min/mod A ? ? ? ?08/10/21 ?Supine:    bridge x 10 ?  Bent knee raise x 10 ?                                LTR x 10 ?                                 Hip adduction isometric x 10 with ball ? ?Prone:  ?   Prone on elbows ?   Prone glute sets x 10 ?   Prone hip extension x 5 each ? ?Sit to stand from the mat and ambulation with hemiwalker and min A x 2 x 4 ft with WC following; brief seated rest and then ambulation another 3 ft with hemiwalker and min A x 2 with WC following.  ? ? ? ? ?           08/08/2021 ?                 Standing:  wt shifting; attempt to side step ?                 Prone:  knee flexion x 1o ?                              Hip extensin x 10  ?                               Heel squeeze/ glut squeeze each x 10   ?                 Side lying:  hip abduction x 10 ?                 Supine:  bridge x 10 ?                               Bent knee raise x 10 ?                               Trunk rotation x 10 ?                                Curl x 10  ?                               Hip adduction isometric x 10 ?                 Gait at end of session with hemiwalker 3 ft x 2 with min assist. ? ? ? ? ? ?                08/04/21:   There-Ex ?Transfer with minA sit to stand and step pivot R ?-Sitting = hip shifts side to side x 10 B ? - arms crossed, lumbar rotations x 10 B ?- Supine = CGA for safety sit to sidelying ? - Right sidelying for left hip abduction clamshell 2 x 10  ? - Supine - hip adduction isometric verse yellow smiley ball 2 x 10  ? -  Supine - bridge 2 x 8  ? - Supine - Single knee to chest - transition to piriformis fig 4 stretch 2 x 30 sec B  ? - Left sidelying for right hip abduction clamshell 2 x 10 ? - Left sidelying for AAROM gravity eliminated R shoulder flexion x 10 reps ?- Transition - bed rolling back to right sidelying for modA to sit edge of bed ?- Sitting =  ?- Big blue Ball roll outs with assist for Right hand to keep it open x 10  ?- Sit to stand with hemiwalker x 5 ?-AAROM R shoulder flexion punch out then up x 10 with minA for available range ?-AAROM R shoulder flexion punch up with maxA to PROM top x 5  ?- Standing = Weight shift with hemi walker x 10 reps ?- Sitting = LAQ with cue for posterior chain opening x 10 B  ?- with last one manual support hold R hamstring stretch 1 x 30 seconds  ?Transfer with minA sit to stand and step pivot L  ? ? ?07/31/21 ? ?Transfer min A to mod A (manual to assist with standing balance and instructed to pivot LE) ? ?Seated: ?Hip flexion x 10 each ?LAQ x 10 each ?Leaning to Right with emphasis on pushing back up to upright using Right arm 2 x 10 ?Ball roll outs with assist for Right hand to keep it open ?Sit  to stand with hemiwalker 2 x 5 ?Weight shift with hemi walker x 10 reps ?Forward Toe tap on the Right in standing with hemiwalker x 2 ? ? ?*declines lying exercise due getting carsick earlier ? ? ? ?07/13/21 ?Marching 2x 10  ?LAQ 2x 10 5 Second ?Heel/toe raises 2 x 10 5 second hold ? Italics below from Evaluation on 07/13/2021 ?DIAGNOSTIC FINDINGS: MRI 08/24/20 IMPRESSION: ?Acute lacunar type infarct involving the posterior limb of the left ?internal capsule. Additional small subacute lacunar type infarct ?involving the left periatrial white matter. ? ?           ?MMT Right ?07/13/2021 Left ?07/13/2021  ?Hip flexion 3-/5 5/5  ?Hip extension  2-    ?Hip abduction  3-    ?Hip adduction      ?Hip internal rotation      ?Hip external rotation      ?Knee flexion 2/5tested prone 5/5  ?Knee extension 4-/5 5/5  ?Ankle dorsiflexion 3/5 5/5  ?Sit to stand and transfer with mod assist with pt having a posterior lean. ?Sit to supine with mod assist ?Supine to side lying to prone with min assist  ? ?TX:  sit to stand x 5;  ?               Sitting:  LAQ x 10 with manual resistance  ?               Side lying hip abduction x 5 ?               Supine:  bridge x 10  ?                      SLR x 10               Isometric ab/adduction x 10  ?                      Clam x 10 ?  Dead bug with therapist assist of RT UE x 10  ?               Prone:  Knee flexion, SLR x 10@ ?  ? ?07/13/2021  ?LE ROM:    WFL for tasks assessed ?  ?Active  Right ?07/13/2021 Left ?07/13/2021  ?Hip flexion      ?Hip extension      ?Hip abduction      ?Hip adduction      ?Hip internal rotation      ?Hip external rotation      ?Knee flexion      ?Knee extension      ?Ankle dorsiflexion      ?Ankle plantarflexion      ?Ankle inversion      ?Ankle eversion      ? (Blank rows = not tested) ?  ?MMT:   ?  ?MMT Right ?07/13/2021 Left ?07/13/2021  ?Hip flexion 3-/5 5/5  ?Hip extension      ?Hip abduction      ?Hip adduction      ?Hip internal rotation       ?Hip external rotation      ?Knee flexion 3+/5 5/5  ?Knee extension 4-/5 5/5  ?Ankle dorsiflexion 3/5 5/5  ? ?Ankle plantarflexion      ?Ankle inversion      ?Ankle eversion      ?(Blank rows = not te

## 2021-08-17 ENCOUNTER — Ambulatory Visit (HOSPITAL_COMMUNITY): Payer: 59 | Admitting: Physical Therapy

## 2021-08-17 DIAGNOSIS — R2689 Other abnormalities of gait and mobility: Secondary | ICD-10-CM

## 2021-08-17 DIAGNOSIS — R278 Other lack of coordination: Secondary | ICD-10-CM

## 2021-08-17 DIAGNOSIS — M6281 Muscle weakness (generalized): Secondary | ICD-10-CM

## 2021-08-17 NOTE — Therapy (Signed)
?OUTPATIENT PHYSICAL THERAPY TREATMENT NOTE ? ? ?[name] ?MRN: [id number] ?DOB:May 18, 1965, 56 y.o., female ?Today's Date: 08/17/2021 ? ?PCP: Toma Deiters, MD ?REFERRING PROVIDER: Toma Deiters, MD ? ?END OF SESSION:  ? PT End of Session - 08/17/21 1731   ? ? Visit Number 9   ? Number of Visits 12   ? Date for PT Re-Evaluation 08/24/21   ? Authorization Type [employer] (60 VL 1 used, 59 remain)   ? Authorization - Visit Number 9   ? Authorization - Number of Visits 59   ? PT Start Time 1405   ? PT Stop Time 1455   ? PT Time Calculation (min) 50 min   ? Equipment Utilized During Treatment Gait belt   ? Activity Tolerance Patient tolerated treatment well;Patient limited by fatigue   ? Behavior During Therapy [facility] for tasks assessed/performed   ? ?  ?  ? ?  ? ? ? ? ?Past Medical History:  ?Diagnosis Date  ? Acute heart failure (HCC)   ? ADD (attention deficit disorder)   ? Bronchitis   ? Depression   ? Diabetes mellitus without complication (HCC)   ? Elevated troponin   ? Hypertension   ? Obesity   ? ?Past Surgical History:  ?Procedure Laterality Date  ? CESAREAN SECTION    ? ?Patient Active Problem List  ? Diagnosis Date Noted  ? Chronic combined systolic and diastolic congestive heart failure (HCC) 08/25/2020  ? Acute CVA (cerebrovascular accident) (HCC) 08/24/2020  ? Borderline prolonged QT interval 08/24/2020  ? Type 2 diabetes mellitus with complication, without long-term current use of insulin (HCC) 03/16/2020  ? Anasarca   ? Diabetes mellitus (HCC)   ? Hypokalemia   ? Vitamin D deficiency   ? Hypothyroidism   ? Physical deconditioning   ? Acute on chronic systolic CHF (congestive heart failure) (HCC) 03/09/2020  ? Acute on chronic systolic HF (heart failure) (HCC) 03/09/2020  ? Hyperglycemia 12/22/2018  ? Hypertensive crisis 04/04/2018  ? Acute CHF (congestive heart failure) (HCC) 04/04/2018  ? Mild renal insufficiency 04/04/2018  ? Elevated troponin 04/04/2018  ? Bronchitis   ?  Obesity   ? Seborrheic dermatitis of scalp 05/23/2016  ? Essential hypertension 12/02/2015  ? Depression 12/02/2015  ? Primary insomnia 12/02/2015  ? Morbid obesity with body mass index of 50.0-59.9 in adult [facility]) 12/02/2015  ? Attention deficit hyperactivity disorder (ADHD), predominantly inattentive type 12/02/2015  ? ? ?REFERRING DIAG: Z86.73 (ICD-10-CM) - Personal history of transient ischemic attack (TIA), and cerebral infarction without residual deficits  ?  ? ?THERAPY DIAG:  ?Other abnormalities of gait and mobility ? ?Muscle weakness (generalized) ? ?Other lack of coordination ? ?PERTINENT HISTORY:  Essential hypertension;  ?Hypothyroidism due to Hashimoto's thyroiditis;  ?CVD (cerebrovascular disease);  ?History of CVA (cerebrovascular accident);  ?Type 2 diabetes mellitus with hyperglycemia  ? ?PRECAUTIONS: fall  ? ?SUBJECTIVE: Pt states she had her first fall yesterday as she was turning too sharp in her chair and it tipped over on it's side.  STates it took 2 people to help her get up out of the floor.  States her bottom and legs go numb sometimes but is having no pain. ? ?PAIN:  ?Are you having pain? No ? ?OBJECTIVE:    ?  ?  ?TODAY'S TREATMENT:  ? ?08/17/21 ? Ambulation using Rt UE platform walker, 2 bouts; 5' and 10' with min A ? Seated LAQ Rt 5" holds ? Supine Bridge 2X10 ? SLR  2X10 Rt LE ? Transfers mat to/from wheelchair with min assist and lateral scooting with VC's ? ?08/15/21 ?Transfer to standing with hemi walker x2 with min A with 3 feet ambulation to table min A ?Bridge 2x10  ?March 2x 10 bilateral  ?Seated pelvic tilts 2x 20  ?Seated lateral pelvic tilts 2x 20  ?Lateral scooting 2 x 4 feet ?Transfer to standing with hemiwalker x1 with min A with 2 feet ambulation with min/mod A ? ? ? ?08/10/21 ?Supine:    bridge x 10 ?                                 Bent knee raise x 10 ?                                 LTR x 10 ?                                  Hip adduction isometric x 10 with ball ? ?Prone:   ?    Prone on elbows ?    Prone glute sets x 10 ?    Prone hip extension x 5 each ? ?Sit to stand from the mat and ambulation with hemiwalker and min A x 2 x 4 ft with WC following; brief seated rest and then ambulation another 3 ft with hemiwalker and min A x 2 with WC following.  ? ? ? ? ?           08/08/2021 ?                 Standing:  wt shifting; attempt to side step ?                 Prone:  knee flexion x 1o ?                              Hip extensin x 10  ?                              Heel squeeze/ glut squeeze each x 10   ?                 Side lying:  hip abduction x 10 ?                 Supine:  bridge x 10 ?                               Bent knee raise x 10 ?                               Trunk rotation x 10 ?                                Curl x 10  ?                               Hip  adduction isometric x 10 ?                 Gait at end of session with hemiwalker 3 ft x 2 with min assist. ? ? ? ? ? ?                08/04/21:   There-Ex ?Transfer with minA sit to stand and step pivot R ?-Sitting = hip shifts side to side x 10 B ? - arms crossed, lumbar rotations x 10 B ?- Supine = CGA for safety sit to sidelying ? - Right sidelying for left hip abduction clamshell 2 x 10  ? - Supine - hip adduction isometric verse yellow smiley ball 2 x 10  ? - Supine - bridge 2 x 8  ? - Supine - Single knee to chest - transition to piriformis fig 4 stretch 2 x 30 sec B  ? - Left sidelying for right hip abduction clamshell 2 x 10 ? - Left sidelying for AAROM gravity eliminated R shoulder flexion x 10 reps ?- Transition - bed rolling back to right sidelying for modA to sit edge of bed ?- Sitting =  ?- Big blue Ball roll outs with assist for Right hand to keep it open x 10  ?- Sit to stand with hemiwalker x 5 ?-AAROM R shoulder flexion punch out then up x 10 with minA for available range ?-AAROM R shoulder flexion punch up with maxA to PROM top x 5  ?- Standing = Weight shift with hemi walker x 10 reps ?- Sitting =  LAQ with cue for posterior chain opening x 10 B  ?- with last one manual support hold R hamstring stretch 1 x 30 seconds  ?Transfer with minA sit to stand and step pivot L  ? ? ?07/31/21 ? ?Transfer min A to mod A (manual to assist with standing balance and instructed to pivot LE) ? ?Seated: ?Hip flexion x 10 each ?LAQ x 10 each ?Leaning to Right with emphasis on pushing back up to upright using Right arm 2 x 10 ?Ball roll outs with assist for Right hand to keep it open ?Sit to stand with hemiwalker 2 x 5 ?Weight shift with hemi walker x 10 reps ?Forward Toe tap on the Right in standing with hemiwalker x 2 ? ? ?*declines lying exercise due getting carsick earlier ? ? ? ?07/13/21 ?Marching 2x 10  ?LAQ 2x 10 5 Second ?Heel/toe raises 2 x 10 5 second hold ? Italics below from Evaluation on 07/13/2021 ?DIAGNOSTIC FINDINGS: MRI 08/24/20 IMPRESSION: ?Acute lacunar type infarct involving the posterior limb of the left ?internal capsule. Additional small subacute lacunar type infarct ?involving the left periatrial white matter. ? ?           ?MMT Right ?07/13/2021 Left ?07/13/2021  ?Hip flexion 3-/5 5/5  ?Hip extension  2-    ?Hip abduction  3-    ?Hip adduction      ?Hip internal rotation      ?Hip external rotation      ?Knee flexion 2/5tested prone 5/5  ?Knee extension 4-/5 5/5  ?Ankle dorsiflexion 3/5 5/5  ?Sit to stand and transfer with mod assist with pt having a posterior lean. ?Sit to supine with mod assist ?Supine to side lying to prone with min assist  ? ?TX:  sit to stand x 5;  ?               Sitting:  LAQ x 10  with manual resistance  ?               Side lying hip abduction x 5 ?               Supine:  bridge x 10  ?                      SLR x 10               Isometric ab/adduction x 10  ?                      Clam x 10 ?                      Dead bug with therapist assist of RT UE x 10  ?               Prone:  Knee flexion, SLR x 10@ ?  ? ?07/13/2021  ?LE ROM:    WFL for tasks assessed ?  ? ?MMT:   ?  ?MMT  Right ?07/13/2021 Left ?07/13/2021  ?Hip flexion 3-/5 5/5  ?Hip extension      ?Hip abduction      ?Hip adduction      ?Hip internal rotation      ?Hip external rotation      ?Knee flexion 3+/5 5/5  ?Knee extension 4-

## 2021-08-22 ENCOUNTER — Ambulatory Visit (HOSPITAL_COMMUNITY): Payer: 59 | Admitting: Physical Therapy

## 2021-08-24 ENCOUNTER — Ambulatory Visit (HOSPITAL_COMMUNITY): Payer: 59

## 2021-08-24 DIAGNOSIS — R2689 Other abnormalities of gait and mobility: Secondary | ICD-10-CM | POA: Diagnosis not present

## 2021-08-24 DIAGNOSIS — R278 Other lack of coordination: Secondary | ICD-10-CM

## 2021-08-24 DIAGNOSIS — R29818 Other symptoms and signs involving the nervous system: Secondary | ICD-10-CM

## 2021-08-24 DIAGNOSIS — R29898 Other symptoms and signs involving the musculoskeletal system: Secondary | ICD-10-CM

## 2021-08-24 NOTE — Therapy (Signed)
OUTPATIENT PHYSICAL THERAPY TREATMENT NOTE  Progress Note Reporting Period 07/13/2021 to 08/24/21  See note below for Objective Data and Assessment of Progress/Goals.       [name] MRN: [id number] DOB:1965/07/28, 56 y.o., female Today's Date: 08/24/2021  PCP: Toma Deiters, MD REFERRING PROVIDER: Vivien Presto, MD  END OF SESSION:   PT End of Session - 08/24/21 1452     Visit Number 10    Number of Visits 20    Date for PT Re-Evaluation 09/21/21    Authorization Type [employer] (60 VL 10 used, 50 remain)    Authorization - Visit Number 10    Authorization - Number of Visits 59    PT Start Time 1440    PT Stop Time 1520    PT Time Calculation (min) 40 min    Equipment Utilized During Treatment Gait belt    Activity Tolerance Patient tolerated treatment well;Patient limited by fatigue    Behavior During Therapy WFL for tasks assessed/performed                Past Medical History:  Diagnosis Date   Acute heart failure (HCC)    ADD (attention deficit disorder)    Bronchitis    Depression    Diabetes mellitus without complication (HCC)    Elevated troponin    Hypertension    Obesity    Past Surgical History:  Procedure Laterality Date   CESAREAN SECTION     Patient Active Problem List   Diagnosis Date Noted   Chronic combined systolic and diastolic congestive heart failure (HCC) 08/25/2020   Acute CVA (cerebrovascular accident) (HCC) 08/24/2020   Borderline prolonged QT interval 08/24/2020   Type 2 diabetes mellitus with complication, without long-term current use of insulin (HCC) 03/16/2020   Anasarca    Diabetes mellitus (HCC)    Hypokalemia    Vitamin D deficiency    Hypothyroidism    Physical deconditioning    Acute on chronic systolic CHF (congestive heart failure) (HCC) 03/09/2020   Acute on chronic systolic HF (heart failure) (HCC) 03/09/2020   Hyperglycemia 12/22/2018   Hypertensive crisis 04/04/2018   Acute  CHF (congestive heart failure) (HCC) 04/04/2018   Mild renal insufficiency 04/04/2018   Elevated troponin 04/04/2018   Bronchitis    Obesity    Seborrheic dermatitis of scalp 05/23/2016   Essential hypertension 12/02/2015   Depression 12/02/2015   Primary insomnia 12/02/2015   Morbid obesity with body mass index of 50.0-59.9 in adult [facility]) 12/02/2015   Attention deficit hyperactivity disorder (ADHD), predominantly inattentive type 12/02/2015    REFERRING DIAG: Z86.73 (ICD-10-CM) - Personal history of transient ischemic attack (TIA), and cerebral infarction without residual deficits     THERAPY DIAG:  Other abnormalities of gait and mobility  Muscle weakness (generalized)  Other lack of coordination  Other symptoms and signs involving the nervous system  Other symptoms and signs involving the musculoskeletal system  PERTINENT HISTORY:  Essential hypertension;  Hypothyroidism due to Hashimoto's thyroiditis;  CVD (cerebrovascular disease);  History of CVA (cerebrovascular accident);  Type 2 diabetes mellitus with hyperglycemia   PRECAUTIONS: fall   SUBJECTIVE: Patient groaning and states she has very high pain level back pain on start of therapy session "I've been sitting too long and my back is killing me"; she states she doesn't feel she is making any improvement even though she is now doing some walking at home; her mother states she is improving.   PAIN:  Are you  having pain? Yes,  9/10 initially  OBJECTIVE:        TODAY'S TREATMENT:  08/24/2021 WC to mat transfer with mod A today; increased pain levels today require patient to need increased assistance Sit to supine min to mod Assist Moist heat on low back in supine x 10' for pain  Rolling to prone with CGA Prone lying x 5' Prone to supine Cga Supine to sit min A Mat to WC with min Assist Progress note    08/17/21  Ambulation using Rt UE platform walker, 2 bouts; 5' and 10' with min A  Seated LAQ Rt 5"  holds  Supine Bridge 2X10  SLR 2X10 Rt LE  Transfers mat to/from wheelchair with min assist and lateral scooting with VC's  08/15/21 Transfer to standing with hemi walker x2 with min A with 3 feet ambulation to table min A Bridge 2x10  March 2x 10 bilateral  Seated pelvic tilts 2x 20  Seated lateral pelvic tilts 2x 20  Lateral scooting 2 x 4 feet Transfer to standing with hemiwalker x1 with min A with 2 feet ambulation with min/mod A               Italics below from Evaluation on 07/13/2021 DIAGNOSTIC FINDINGS: MRI 08/24/20 IMPRESSION: Acute lacunar type infarct involving the posterior limb of the left internal capsule. Additional small subacute lacunar type infarct involving the left periatrial white matter.             MMT Right 07/13/2021 Left 07/13/2021 Right 08/24/2021 Left 08/24/2021  Hip flexion 3-/5 5/5 3- 5  Hip extension  2-   2+ 4-  Hip abduction  3-      Hip adduction        Hip internal rotation        Hip external rotation        Knee flexion 2/5tested prone 5/5 2+ 5  Knee extension 4-/5 5/5 4 5   Ankle dorsiflexion 3/5 5/5 2- 5  Sit to stand and transfer with mod assist with pt having a posterior lean. Sit to supine with mod assist Supine to side lying to prone with min assist   TX:  sit to stand x 5;                 Sitting:  LAQ x 10 with manual resistance                 Side lying hip abduction x 5                Supine:  bridge x 10                        SLR x 10               Isometric ab/adduction x 10                        Clam x 10                       Dead bug with therapist assist of RT UE x 10                 Prone:  Knee flexion, SLR x 10@    07/13/2021  LE ROM:    WFL for tasks assessed    MMT:     MMT Right 07/13/2021 Left 07/13/2021  Hip flexion  3-/5 5/5  Hip extension      Hip abduction      Hip adduction      Hip internal rotation      Hip external rotation      Knee flexion 3+/5 5/5  Knee extension 4-/5 5/5  Ankle  dorsiflexion 3/5 5/5   Ankle plantarflexion      Ankle inversion      Ankle eversion      (Blank rows = not tested)     TRANSFERS: Assistive device utilized: Hemi walker and Wheelchair (manual)  Sit to stand: Mod A Stand to sit: Mod A       GAIT: Gait pattern: ataxic Distance walked: 3 feet Assistive device utilized:  hemiwalker Level of assistance:  min/mod assist Comments: unsteady   PATIENT EDUCATION:  Education details: 08/24/2021 progress and plan of care. Patient educated on exam findings, POC, scope of PT, HEP, and increasing activity. 08/04/21 = education on nerve tension, nerve and muscle lengthening, clonus reaction and pressure to stop; and mostly focused on education of increased movement for unweighting "wiggle throughout day"  Person educated: Patient Education method: Explanation, Demonstration, and Handouts Education comprehension: verbalized understanding, returned demonstration, verbal cues required, and tactile cues required      HOME EXERCISE PROGRAM: EValuation:Marching 2x 10                     LAQ 2x 10 5 Second                    Heel/toe raises 2 x 10 5 second hold              07/21/2021  Side lying hip abduction x 5                Supine:  bridge x 10                                SLR x 10        4/11: sidelying forearm push up exercises      GOALS: Goals reviewed with patient? Yes   SHORT TERM GOALS: Target date: 09/14/2021   Patient will be independent with HEP in order to improve functional outcomes. Baseline:  Goal status: ongoing    2.  Patient will report at least 25% improvement in symptoms for improved quality of life. Baseline:  Goal status:ongoing      LONG TERM GOALS: Target date: 09/21/2021   Patient will report at least 75% improvement in symptoms for improved quality of life. Baseline:  Goal status: ongoing    2.  Patient will be able to perform sit to stand without UE use in order to demonstrate improved functional  strength. Baseline: 08/24/21 needs min to mod A today due to increased back pain Goal status: ongoing    3.  Patient will be able to stand/walk for 5 minutes with LRAD use for balance in order to complete household tasks. Baseline: 08/24/21 walking 5' and then 10' ft at recent session.  30 sec to 1 min at a time.  Goal status: ongoing        ASSESSMENT:   CLINICAL IMPRESSION: Patient with very high pain levels at the start of session today.  Reports high levels with sitting in the wheelchair too long.  Progress note due today. Patient has made some improvements with lower extremity strength although her back pain today  likely limits her MMT's.  She did not walk today but walked 5' and 10' in recent session and reports she is walking some at home up to 18 steps with RW and wheelchair following.  Discussed continued therapy with patient to address remaining unmet and partially met goals and she is agreeable. Patient will continue to benefit from physical therapy in order to improve function and reduce impairment.    OBJECTIVE IMPAIRMENTS Abnormal gait, decreased activity tolerance, decreased balance, decreased endurance, decreased knowledge of use of DME, decreased mobility, difficulty walking, decreased ROM, decreased strength, impaired flexibility, impaired UE functional use, improper body mechanics, and obesity.    ACTIVITY LIMITATIONS cleaning, community activity, driving, meal prep, occupation, laundry, yard work, shopping, and yard work.    PERSONAL FACTORS Behavior pattern, Fitness, Past/current experiences, Time since onset of injury/illness/exacerbation, and 3+ comorbidities: CVA, increased BMI, sedentary   are also affecting patient's functional outcome.      REHAB POTENTIAL: Good   CLINICAL DECISION MAKING: Evolving/moderate complexity   EVALUATION COMPLEXITY: Moderate   PLAN: PT FREQUENCY: 2x/week   PT DURATION: 6 weeks   PLANNED INTERVENTIONS: Therapeutic exercises,  Therapeutic activity, Neuromuscular re-education, Balance training, Gait training, Patient/Family education, Joint manipulation, Joint mobilization, Stair training, Orthotic/Fit training, DME instructions, Aquatic Therapy, Dry Needling, Electrical stimulation, Spinal manipulation, Spinal mobilization, Cryotherapy, Moist heat, Compression bandaging, scar mobilization, Splintting, Taping, Traction, Ultrasound, Ionotophoresis 4mg /ml Dexamethasone, and Manual therapy    PLAN FOR NEXT SESSION: request continue therapy 2 x a week x 4 weeks to continue to work on strengthening, bed mobility, gait and transfer training, standing balance and increasing overall functional mobility and decreasing fall risk.    3:41 PM, 08/24/21 Ryah Cribb Small Carolyna Yerian MPT [facility] [place] [phone]

## 2021-08-27 ENCOUNTER — Encounter (HOSPITAL_COMMUNITY): Payer: Self-pay

## 2021-08-27 NOTE — Therapy (Signed)
?OUTPATIENT OCCUPATIONAL THERAPY WHEELCHAIR EVALUATION ? ? ?[name] ?MRN: [id number] ?DOB:1965-04-19, 56 y.o., female ?Today's Date: 08/27/2021 ? ?PCP: Toma Deiters, MD ?REFERRING PROVIDER: Delano Metz, MD ? ?END OF SESSION:  ? OT End of Session - 08/27/21 1823   ? ? Visit Number 3   ? Number of Visits 24   ? Date for OT Re-Evaluation 10/31/21   ? Authorization Type 1) [employer] 2) [place] medicaid [employer]   ? Authorization Time Period 1) no copay 60 visit limit  2) No auth required for patients over 21 at this time. Adults - 27 visits per calendar year OT/PT/SLP   ? Authorization - Visit Number 3   ? Authorization - Number of Visits 60   ? OT Start Time 1400   Pt arrived late  ? OT Stop Time 1430   ? OT Time Calculation (min) 30 min   ? Activity Tolerance Patient tolerated treatment well   ? Behavior During Therapy [facility] for tasks assessed/performed   ? ?  ?  ? ?  ? ? ? ?Past Medical History:  ?Diagnosis Date  ? Acute heart failure (HCC)   ? ADD (attention deficit disorder)   ? Bronchitis   ? Depression   ? Diabetes mellitus without complication (HCC)   ? Elevated troponin   ? Hypertension   ? Obesity   ? ?Past Surgical History:  ?Procedure Laterality Date  ? CESAREAN SECTION    ? ?Patient Active Problem List  ? Diagnosis Date Noted  ? Chronic combined systolic and diastolic congestive heart failure (HCC) 08/25/2020  ? Acute CVA (cerebrovascular accident) (HCC) 08/24/2020  ? Borderline prolonged QT interval 08/24/2020  ? Type 2 diabetes mellitus with complication, without long-term current use of insulin (HCC) 03/16/2020  ? Anasarca   ? Diabetes mellitus (HCC)   ? Hypokalemia   ? Vitamin D deficiency   ? Hypothyroidism   ? Physical deconditioning   ? Acute on chronic systolic CHF (congestive heart failure) (HCC) 03/09/2020  ? Acute on chronic systolic HF (heart failure) (HCC) 03/09/2020  ? Hyperglycemia 12/22/2018  ? Hypertensive crisis 04/04/2018  ? Acute CHF (congestive heart failure) (HCC)  04/04/2018  ? Mild renal insufficiency 04/04/2018  ? Elevated troponin 04/04/2018  ? Bronchitis   ? Obesity   ? Seborrheic dermatitis of scalp 05/23/2016  ? Essential hypertension 12/02/2015  ? Depression 12/02/2015  ? Primary insomnia 12/02/2015  ? Morbid obesity with body mass index of 50.0-59.9 in adult [facility]) 12/02/2015  ? Attention deficit hyperactivity disorder (ADHD), predominantly inattentive type 12/02/2015  ? ? ?ONSET DATE: 08/23/20 ? ?REFERRING DIAG: Left CVA with right side weakness ? ?THERAPY DIAG:  ?Other lack of coordination ? ?Other symptoms and signs involving the nervous system ? ?Other symptoms and signs involving the musculoskeletal system ? ? ?PERTINENT HISTORY: Patient sustained an acute lacunar infarct on the left side causing right side weakness. Patient reports that she received therapy services at [facility] for 2.5 weeks, and HH OT and PT for 1 month. Does not know the date when she sees Dr. Salvadore Farber for a follow-up. She is currently using a borrowed manual wheelchair from [facility], [place] she is unaware when she needs to return it.  ? ?PRECAUTIONS: Fall and Other: right side hemiparesis ? ?SUBJECTIVE: S: I really like the brace you made me. It's working out wonderfully! ? ?PAIN:  ?Are you having pain?  No number provided during session.  ? ? ? ?Wheelchair evaluation was completed  at today's visit.  Josh Cadle, ATP from [employer] will be doing a home visit for further evaluation of appropriate equipment.  Full letter of medical necessity will be available once final recommendations made. ? ?Thank you for the referral of this patient. ?Limmie Patricia, OTR/L,CBIS  ?[phone] ?  ? ? ? ? ?PLAN: ?OT FREQUENCY: 2x/week ?  ?OT DURATION: 12 weeks ?  ?PLANNED INTERVENTIONS: self care/ADL training, therapeutic exercise, therapeutic activity, neuromuscular re-education, manual therapy, passive range of motion, balance training, splinting, electrical stimulation, ultrasound, moist  heat, cryotherapy, patient/family education, visual/perceptual remediation/compensation, psychosocial skills training, energy conservation, coping strategies training, and DME and/or AE instructions ?  ?RECOMMENDED OTHER SERVICES:  ?  ?CONSULTED AND AGREED WITH PLAN OF CARE: Patient and family member/caregiver ?  ?PLAN FOR NEXT SESSION: P: Complete FOTO. Begin NM re-ed (start in gravity eliminated plane to facilitate the most shoulder movement. - Recommend doing left side side-lying with right UE on powder board. No HEP has been established yet and needs one started.   ? ?Limmie Patricia, OTR/L,CBIS  ?[phone] ? ?08/27/2021, 6:24 PM ? ?  ? ?  ?

## 2021-08-28 ENCOUNTER — Telehealth (HOSPITAL_COMMUNITY): Payer: Self-pay | Admitting: Occupational Therapy

## 2021-08-28 NOTE — Telephone Encounter (Signed)
Her dad called and said Transportation with Insurance will not bring her they need 2 day notice. I went over the patients future May appointments and ask that he call them today to set these up. He said he would ?

## 2021-08-29 ENCOUNTER — Ambulatory Visit (HOSPITAL_COMMUNITY): Payer: 59

## 2021-08-29 ENCOUNTER — Ambulatory Visit (HOSPITAL_COMMUNITY): Payer: 59 | Admitting: Occupational Therapy

## 2021-08-31 ENCOUNTER — Ambulatory Visit (HOSPITAL_COMMUNITY): Payer: 59

## 2021-08-31 ENCOUNTER — Encounter (HOSPITAL_COMMUNITY): Payer: Self-pay

## 2021-08-31 DIAGNOSIS — R2689 Other abnormalities of gait and mobility: Secondary | ICD-10-CM | POA: Diagnosis not present

## 2021-08-31 DIAGNOSIS — R278 Other lack of coordination: Secondary | ICD-10-CM

## 2021-08-31 DIAGNOSIS — M6281 Muscle weakness (generalized): Secondary | ICD-10-CM

## 2021-08-31 DIAGNOSIS — R29818 Other symptoms and signs involving the nervous system: Secondary | ICD-10-CM

## 2021-08-31 DIAGNOSIS — R29898 Other symptoms and signs involving the musculoskeletal system: Secondary | ICD-10-CM

## 2021-08-31 NOTE — Therapy (Signed)
OUTPATIENT OCCUPATIONAL THERAPY TREATMENT NOTE   Patient Name: Connie CapronDenise Dettman MRN: [id number] DOB:[birth year], 56 y.o., female Today's Date: 08/31/2021  PCP: Toma DeitersHasanaj, Xaje A, MD REFERRING PROVIDER: Delano Metzorrington, Kip, MD  END OF SESSION:   OT End of Session - 08/31/21 1531     Visit Number 4    Number of Visits 24    Date for OT Re-Evaluation 10/31/21    Authorization Type 1) [employer] 2) [place] medicaid [employer]    Authorization Time Period 1) no copay 60 visit limit  2) No auth required for patients over 21 at this time. Adults - 27 visits per calendar year OT/PT/SLP    Authorization - Visit Number 4    Authorization - Number of Visits 60    OT Start Time 1515    OT Stop Time 1553    OT Time Calculation (min) 38 min    Activity Tolerance Patient tolerated treatment well    Behavior During Therapy WFL for tasks assessed/performed              Past Medical History:  Diagnosis Date   Acute heart failure (HCC)    ADD (attention deficit disorder)    Bronchitis    Depression    Diabetes mellitus without complication (HCC)    Elevated troponin    Hypertension    Obesity    Past Surgical History:  Procedure Laterality Date   CESAREAN SECTION     Patient Active Problem List   Diagnosis Date Noted   Chronic combined systolic and diastolic congestive heart failure (HCC) 08/25/2020   Acute CVA (cerebrovascular accident) (HCC) 08/24/2020   Borderline prolonged QT interval 08/24/2020   Type 2 diabetes mellitus with complication, without long-term current use of insulin (HCC) 03/16/2020   Anasarca    Diabetes mellitus (HCC)    Hypokalemia    Vitamin D deficiency    Hypothyroidism    Physical deconditioning    Acute on chronic systolic CHF (congestive heart failure) (HCC) 03/09/2020   Acute on chronic systolic HF (heart failure) (HCC) 03/09/2020   Hyperglycemia 12/22/2018   Hypertensive crisis 04/04/2018   Acute CHF (congestive heart failure) (HCC) 04/04/2018   Mild renal  insufficiency 04/04/2018   Elevated troponin 04/04/2018   Bronchitis    Obesity    Seborrheic dermatitis of scalp 05/23/2016   Essential hypertension 12/02/2015   Depression 12/02/2015   Primary insomnia 12/02/2015   Morbid obesity with body mass index of 50.0-59.9 in adult [facility](HCC) 12/02/2015   Attention deficit hyperactivity disorder (ADHD), predominantly inattentive type 12/02/2015    ONSET DATE: 08/23/20  REFERRING DIAG: Left CVA with right side weakness  THERAPY DIAG:  Other lack of coordination  Other symptoms and signs involving the nervous system  Other symptoms and signs involving the musculoskeletal system   PERTINENT HISTORY: Patient sustained an acute lacunar infarct on the left side causing right side weakness. Patient [facility] and [facility] OT and PT for 1 month. Does not know the date when she sees Dr. Salvadore Farberorrington for a follow-up. She is currently using a borrowed manual wheelchair from [facility]and she is unaware when she needs to return it.   PRECAUTIONS: Fall and Other: right side hemiparesis  SUBJECTIVE: S: I took Zofran for motion sickness before I came here and it makes me so sleepy.  PAIN:  Are you having pain? Yes: NPRS scale: 4/10 Pain location: low back Pain description: ache, intermittent Aggravating factors:  sitting in wheelchair for too long. Relieving factors: laying down or changing position     OBJECTIVE:  UE ROM      Active ROM - seated. IR/er adducted Right 08/08/2021  Shoulder flexion 0  Shoulder abduction 50  Shoulder internal rotation 60  Shoulder external rotation 0 - UE positioning assist  Elbow flexion 136  Elbow extension -21  Wrist flexion  - gravity eliminated 66  Wrist extension - gravity eliminated 0  Wrist ulnar deviation unable  Wrist radial deviation unable  Wrist pronation unable  Wrist supination 0  (Blank rows = not tested)     UE MMT:      MMT -  seated. IR/er adducted Right 08/08/2021  Shoulder flexion 3-/5  Shoulder abduction 3-/5  Shoulder internal rotation 3/5  Shoulder external rotation 3-/5  Elbow flexion 3/5  Elbow extension 3-/5  Wrist flexion 3/5  Wrist extension 3-/5  Wrist ulnar deviation 0/5  Wrist radial deviation 0/5  Wrist pronation 3-/5  Wrist supination 3-/5  (Blank rows = not tested)   HAND FUNCTION: Grip strength: Right: 3 lbs; Left: 58 lbs and Lateral pinch: Right: 4 lbs, Left: 12 lbs   COORDINATION: Unable to test 9 hole peg test  GOALS:     SHORT TERM GOALS: Target date: 09/19/2021   Patient/caregiver will be educated on HEP in order to facilitate her progress in therapy and increase her functional performance when completing ADL tasks and move towards more functional use of her RUE.  Baseline: Goal status: on-going   2.  Patient will complete bathing and dressing tasks at moderate assist level while seated and using a hemi dressing technique.  Baseline:  Goal status: on-going   3.  Patient will increase her RUE A/ROM by 10 degrees where able in order to increase her ability to assist and complete basic ADL tasks.  Baseline:  Goal status:on-going   4.  Patient will demonstrate an increase her right hand coordination in order to complete the 9 hole peg test with assist.  Baseline:  Goal status:on-going   5.  Patient will increase her hand hand grip and pinch strength by 2-3lbs in order to progress towards using her right hand to stabilize items when eating, grooming, etc. Baseline:  Goal status: on-going     LONG TERM GOALS: Target date: 10/31/2021   Patient will complete bathing,dressing, and grooming tasks at min assist level while seated and using a hemi dressing technique.  Baseline:  Goal status:on-going   2.  Patient will be able to demonstrate a tub/shower transfer at min assist using a tub bench in order to return to taking shower with caregiver assisting. Baseline:  Goal  status: on-going   3.  Patient will increase her RUE A/ROM in order to use her right arm/hand as an active assist when completing dressing, bathing, and grooming tasks.  Baseline:  Goal status: on-going   4.  Patient will demonstrate an  increase her right hand coordination by completing the 9 hole peg test without assistance. Baseline:  Goal status: on-going   5.  Patient will increase her right hand grip and pinch strength to 10% of average norms for age in order to progress her ability to use her hand as an active assist.  Baseline:  Goal status: on-going   PATIENT EDUCATION: Education details: AA/ROM shoulder - supine (flexion, protraction), AA/ROM shoulder - seated (table flexion/extension), elbow (table flexion/extension) Person educated: patient, mother Education method: handout, demonstration, verbal cues, tactile cues Education comprehension:  verbalized and demonstrated understanding.     HOME EXERCISE PROGRAM: 5/18: AA/ROM - shoulder supine and seated (table), elbow AA/ROM at table.  TODAY'S TREATMENT: 08/31/21 - Functional transfer: from mat table and back to wheelchair: Mod Assist, squat pivot, towards left side each time. - Bed mobility: sit to sidelying: min assist, sidelying to sit: min assist. VC for technique, assist with placement of RUE.  - AA/ROM: shoulder supine, protraction, flexion, 10X (provided 2 finger assist on right to help with stability and form) - A/ROM: shoulder, supine, IR/er, 10X (provide positioning support at elbow) - A/ROM: elbow, seated, pillowcase used, flexion/extension, 5X  08/10/21 - Splint fabrication: Custom fabricated composite finger extension splint for right hand to wear at night. Three 2 inch straps used to secure forearm and MCP joints. One 1 inch strap applied to secure thumb. Four sockette's provided.        CLINICAL IMPRESSION: P: Pt reports feeling sleeping due to taking Zofran prior to transportation to clinic to help with  her motion sickness. Provided verbal and physical cues as needed throughout session to help with carry over, form and technique. Focused on shoulder and elbow ROM and mobility during session while establishing a HEP. Pt reports she was never provided one for her arm from any therapy venue.   PLAN: OT FREQUENCY: 2x/week   OT DURATION: 12 weeks   PLANNED INTERVENTIONS: self care/ADL training, therapeutic exercise, therapeutic activity, neuromuscular re-education, manual therapy, passive range of motion, balance training, splinting, electrical stimulation, ultrasound, moist heat, cryotherapy, patient/family education, visual/perceptual remediation/compensation, psychosocial skills training, energy conservation, coping strategies training, and DME and/or AE instructions   RECOMMENDED OTHER SERVICES: Manual wheelchair evaluation from mobility specialist   CONSULTED AND AGREED WITH PLAN OF CARE: Patient and family member/caregiver   PLAN FOR NEXT SESSION: P: Establish a simple HEP for wrist and hand. Continue to work on shoulder and elbow A/ROM.  Limmie Patricia, OTR/L,CBIS  [phone]  08/31/2021, 4:45 PM

## 2021-08-31 NOTE — Patient Instructions (Signed)
Perform each exercise ____10____ reps. 2x days. *Perform laying down for right now.  1) Protraction   Start by holding a wand or cane at chest height.  Next, slowly push the wand outwards in front of your body so that your elbows become fully straightened. Then, return to the original position.     2) Shoulder FLEXION   In the standing position, hold a wand/cane with both arms, palms down on both sides. Raise up the wand/cane allowing your unaffected arm to perform most of the effort. Your affected arm should be partially relaxed.  Don't go past shoulder level.      4) Internal & External Rotation   Supine:          Stand with elbows at the side and elbows bent 90 degrees. Move your forearms away from your body, then bring back inward toward the body.       Gravity Eliminated - UE ROM (table)  Complete ____2___ times day.        Shoulder flexion    1) Move right arm forward and back 5X     Elbow Flexion and Extension  Start with elbow bent and arm resting on the table. Straighten your elbow out and then bend it in. Make sure your shoulder does not move.   Complete 5 times. Rest. Then complete 5 more times.

## 2021-08-31 NOTE — Therapy (Signed)
OUTPATIENT PHYSICAL THERAPY TREATMENT NOTE  Progress Note Reporting Period 07/13/2021 to 08/24/21  See note below for Objective Data and Assessment of Progress/Goals.       [name] MRN: [id number] DOB:31-May-1965, 56 y.o., female Today's Date: 08/31/2021  PCP: Toma Deiters, MD REFERRING PROVIDER: Vivien Presto, MD  END OF SESSION:   PT End of Session - 08/31/21 1440     Visit Number 11    Number of Visits 20    Date for PT Re-Evaluation 09/21/21    Authorization Type [employer] (60 VL 10 used, 50 remain)    Authorization - Visit Number 11    Authorization - Number of Visits 59    PT Start Time 1440    PT Stop Time 1515    PT Time Calculation (min) 35 min    Equipment Utilized During Treatment Gait belt    Activity Tolerance Patient tolerated treatment well;Patient limited by fatigue    Behavior During Therapy WFL for tasks assessed/performed                Past Medical History:  Diagnosis Date   Acute heart failure (HCC)    ADD (attention deficit disorder)    Bronchitis    Depression    Diabetes mellitus without complication (HCC)    Elevated troponin    Hypertension    Obesity    Past Surgical History:  Procedure Laterality Date   CESAREAN SECTION     Patient Active Problem List   Diagnosis Date Noted   Chronic combined systolic and diastolic congestive heart failure (HCC) 08/25/2020   Acute CVA (cerebrovascular accident) (HCC) 08/24/2020   Borderline prolonged QT interval 08/24/2020   Type 2 diabetes mellitus with complication, without long-term current use of insulin (HCC) 03/16/2020   Anasarca    Diabetes mellitus (HCC)    Hypokalemia    Vitamin D deficiency    Hypothyroidism    Physical deconditioning    Acute on chronic systolic CHF (congestive heart failure) (HCC) 03/09/2020   Acute on chronic systolic HF (heart failure) (HCC) 03/09/2020   Hyperglycemia 12/22/2018   Hypertensive crisis 04/04/2018   Acute  CHF (congestive heart failure) (HCC) 04/04/2018   Mild renal insufficiency 04/04/2018   Elevated troponin 04/04/2018   Bronchitis    Obesity    Seborrheic dermatitis of scalp 05/23/2016   Essential hypertension 12/02/2015   Depression 12/02/2015   Primary insomnia 12/02/2015   Morbid obesity with body mass index of 50.0-59.9 in adult [facility]) 12/02/2015   Attention deficit hyperactivity disorder (ADHD), predominantly inattentive type 12/02/2015    REFERRING DIAG: Z86.73 (ICD-10-CM) - Personal history of transient ischemic attack (TIA), and cerebral infarction without residual deficits     THERAPY DIAG:  Other lack of coordination  Other symptoms and signs involving the nervous system  Other symptoms and signs involving the musculoskeletal system  Other abnormalities of gait and mobility  Muscle weakness (generalized)  PERTINENT HISTORY:  Essential hypertension;  Hypothyroidism due to Hashimoto's thyroiditis;  CVD (cerebrovascular disease);  History of CVA (cerebrovascular accident);  Type 2 diabetes mellitus with hyperglycemia   PRECAUTIONS: fall   SUBJECTIVE: Patient reports that she is sleepy this afternoon. Reports that has some back pain but doing ok and ready for PT.   PAIN:  Are you having pain? Yes,  4/10 initially  OBJECTIVE:        TODAY'S TREATMENT:  08/31/21 Pull to stand at // bar x10  Ambulation using Rt UE platform walker,  5' with min A Attempted use of harnessing system  Sit to stand from wheelchair x10 at [facility]  08/24/2021 WC to mat transfer with mod A today; increased pain levels today require patient to need increased assistance Sit to supine min to mod Assist Moist heat on low back in supine x 10' for pain  Rolling to prone with CGA Prone lying x 5' Prone to supine Cga Supine to sit min A Mat to WC with min Assist Progress note    08/17/21  Ambulation using Rt UE platform walker, 2 bouts; 5' and 10' with min A  Seated LAQ Rt 5"  holds  Supine Bridge 2X10  SLR 2X10 Rt LE  Transfers mat to/from wheelchair with min assist and lateral scooting with VC's  08/15/21 Transfer to standing with hemi walker x2 with min A with 3 feet ambulation to table min A Bridge 2x10  March 2x 10 bilateral  Seated pelvic tilts 2x 20  Seated lateral pelvic tilts 2x 20  Lateral scooting 2 x 4 feet Transfer to standing with hemiwalker x1 with min A with 2 feet ambulation with min/mod A               Italics below from Evaluation on 07/13/2021 DIAGNOSTIC FINDINGS: MRI 08/24/20 IMPRESSION: Acute lacunar type infarct involving the posterior limb of the left internal capsule. Additional small subacute lacunar type infarct involving the left periatrial white matter.             MMT Right 07/13/2021 Left 07/13/2021 Right 08/24/2021 Left 08/24/2021  Hip flexion 3-/5 5/5 3- 5  Hip extension  2-   2+ 4-  Hip abduction  3-      Hip adduction        Hip internal rotation        Hip external rotation        Knee flexion 2/5tested prone 5/5 2+ 5  Knee extension 4-/5 5/5 4 5   Ankle dorsiflexion 3/5 5/5 2- 5  Sit to stand and transfer with mod assist with pt having a posterior lean. Sit to supine with mod assist Supine to side lying to prone with min assist   TX:  sit to stand x 5;                 Sitting:  LAQ x 10 with manual resistance                 Side lying hip abduction x 5                Supine:  bridge x 10                        SLR x 10               Isometric ab/adduction x 10                        Clam x 10                       Dead bug with therapist assist of RT UE x 10                 Prone:  Knee flexion, SLR x 10@    07/13/2021  LE ROM:    WFL for tasks assessed    MMT:     MMT Right 07/13/2021 Left 07/13/2021  Hip  flexion 3-/5 5/5  Hip extension      Hip abduction      Hip adduction      Hip internal rotation      Hip external rotation      Knee flexion 3+/5 5/5  Knee extension 4-/5 5/5  Ankle  dorsiflexion 3/5 5/5   Ankle plantarflexion      Ankle inversion      Ankle eversion      (Blank rows = not tested)     TRANSFERS: Assistive device utilized: Hemi walker and Wheelchair (manual)  Sit to stand: Mod A Stand to sit: Mod A       GAIT: Gait pattern: ataxic Distance walked: 3 feet Assistive device utilized:  hemiwalker Level of assistance:  min/mod assist Comments: unsteady   PATIENT EDUCATION:  Education details: 08/24/2021 progress and plan of care. Patient educated on exam findings, POC, scope of PT, HEP, and increasing activity. 08/04/21 = education on nerve tension, nerve and muscle lengthening, clonus reaction and pressure to stop; and mostly focused on education of increased movement for unweighting "wiggle throughout day"  Person educated: Patient Education method: Explanation, Demonstration, and Handouts Education comprehension: verbalized understanding, returned demonstration, verbal cues required, and tactile cues required      HOME EXERCISE PROGRAM: EValuation:Marching 2x 10                     LAQ 2x 10 5 Second                    Heel/toe raises 2 x 10 5 second hold              07/21/2021  Side lying hip abduction x 5                Supine:  bridge x 10                                SLR x 10        4/11: sidelying forearm push up exercises      GOALS: Goals reviewed with patient? Yes   SHORT TERM GOALS: Target date: 09/14/2021   Patient will be independent with HEP in order to improve functional outcomes. Baseline:  Goal status: ongoing    2.  Patient will report at least 25% improvement in symptoms for improved quality of life. Baseline:  Goal status:ongoing      LONG TERM GOALS: Target date: 09/21/2021   Patient will report at least 75% improvement in symptoms for improved quality of life. Baseline:  Goal status: ongoing    2.  Patient will be able to perform sit to stand without UE use in order to demonstrate improved functional  strength. Baseline: 08/24/21 needs min to mod A today due to increased back pain Goal status: ongoing    3.  Patient will be able to stand/walk for 5 minutes with LRAD use for balance in order to complete household tasks. Baseline: 08/24/21 walking 5' and then 10' ft at recent session.  30 sec to 1 min at a time.  Goal status: ongoing        ASSESSMENT:   CLINICAL IMPRESSION: Patient sleepy and not tolerating being on feet for much time. Attempted to utilize harnessing system to calm patient from fear of falling but harness had reverse affect for pt and pt then did want to stand and felt she was  going to fall even more, despite not physically being able to fall.  Patient will continue to benefit from physical therapy in order to improve function and reduce impairment.    OBJECTIVE IMPAIRMENTS Abnormal gait, decreased activity tolerance, decreased balance, decreased endurance, decreased knowledge of use of DME, decreased mobility, difficulty walking, decreased ROM, decreased strength, impaired flexibility, impaired UE functional use, improper body mechanics, and obesity.    ACTIVITY LIMITATIONS cleaning, community activity, driving, meal prep, occupation, laundry, yard work, shopping, and yard work.    PERSONAL FACTORS Behavior pattern, Fitness, Past/current experiences, Time since onset of injury/illness/exacerbation, and 3+ comorbidities: CVA, increased BMI, sedentary   are also affecting patient's functional outcome.      REHAB POTENTIAL: Good   CLINICAL DECISION MAKING: Evolving/moderate complexity   EVALUATION COMPLEXITY: Moderate   PLAN: PT FREQUENCY: 2x/week   PT DURATION: 6 weeks   PLANNED INTERVENTIONS: Therapeutic exercises, Therapeutic activity, Neuromuscular re-education, Balance training, Gait training, Patient/Family education, Joint manipulation, Joint mobilization, Stair training, Orthotic/Fit training, DME instructions, Aquatic Therapy, Dry Needling, Electrical  stimulation, Spinal manipulation, Spinal mobilization, Cryotherapy, Moist heat, Compression bandaging, scar mobilization, Splintting, Taping, Traction, Ultrasound, Ionotophoresis 4mg /ml Dexamethasone, and Manual therapy    PLAN FOR NEXT SESSION: request continue therapy 2 x a week x 4 weeks to continue to work on strengthening, bed mobility, gait and transfer training, standing balance and increasing overall functional mobility and decreasing fall risk.    4:22 PM, 08/31/21 Chigozie Basaldua PT, DPT

## 2021-09-04 ENCOUNTER — Ambulatory Visit (HOSPITAL_COMMUNITY): Payer: 59 | Admitting: Occupational Therapy

## 2021-09-04 ENCOUNTER — Ambulatory Visit (HOSPITAL_COMMUNITY): Payer: 59

## 2021-09-08 ENCOUNTER — Ambulatory Visit (HOSPITAL_COMMUNITY): Payer: 59

## 2021-09-08 ENCOUNTER — Encounter (HOSPITAL_COMMUNITY): Payer: Self-pay

## 2021-09-08 ENCOUNTER — Encounter (HOSPITAL_COMMUNITY): Payer: Self-pay | Admitting: Occupational Therapy

## 2021-09-08 ENCOUNTER — Ambulatory Visit (HOSPITAL_COMMUNITY): Payer: 59 | Admitting: Occupational Therapy

## 2021-09-08 DIAGNOSIS — R29898 Other symptoms and signs involving the musculoskeletal system: Secondary | ICD-10-CM

## 2021-09-08 DIAGNOSIS — R29818 Other symptoms and signs involving the nervous system: Secondary | ICD-10-CM

## 2021-09-08 DIAGNOSIS — R2689 Other abnormalities of gait and mobility: Secondary | ICD-10-CM | POA: Diagnosis not present

## 2021-09-08 DIAGNOSIS — R278 Other lack of coordination: Secondary | ICD-10-CM

## 2021-09-08 DIAGNOSIS — M6281 Muscle weakness (generalized): Secondary | ICD-10-CM

## 2021-09-08 NOTE — Patient Instructions (Signed)
AROM Exercises *Complete exercises ___10___ times each, __2_____ times per day*  1) Wrist Flexion   Prop forearm up on towel roll, assistant holding forearm in thumb up position flex wrist forward towards your body then use left hand to push wrist back into extension       2) Wrist Extension  Start with wrist propped on towel roll, assistant helping stabilized forearm, extend wrist back then relax back down.      3) Radial Deviations  Prop forearm up on towel roll, assistant holding forearm in thumb up position bring thumb side of hand towards ceiling.

## 2021-09-08 NOTE — Therapy (Signed)
OUTPATIENT PHYSICAL THERAPY TREATMENT NOTE  Progress Note Reporting Period 07/13/2021 to 08/24/21  See note below for Objective Data and Assessment of Progress/Goals.       [name] MRN: [id number] DOB:03-05-1966, 56 y.o., female Today's Date: 09/08/2021  PCP: Toma Deiters, MD REFERRING PROVIDER: Vivien Presto, MD  END OF SESSION:   PT End of Session - 09/08/21 1347     Visit Number 12    Number of Visits 20    Date for PT Re-Evaluation 09/21/21    Authorization Type [employer] (60 VL 10 used, 50 remain)    Authorization - Visit Number 12    Authorization - Number of Visits 59    PT Start Time 1347    PT Stop Time 1430    PT Time Calculation (min) 43 min    Equipment Utilized During Treatment Gait belt    Activity Tolerance Patient tolerated treatment well;Patient limited by fatigue    Behavior During Therapy WFL for tasks assessed/performed                Past Medical History:  Diagnosis Date   Acute heart failure (HCC)    ADD (attention deficit disorder)    Bronchitis    Depression    Diabetes mellitus without complication (HCC)    Elevated troponin    Hypertension    Obesity    Past Surgical History:  Procedure Laterality Date   CESAREAN SECTION     Patient Active Problem List   Diagnosis Date Noted   Chronic combined systolic and diastolic congestive heart failure (HCC) 08/25/2020   Acute CVA (cerebrovascular accident) (HCC) 08/24/2020   Borderline prolonged QT interval 08/24/2020   Type 2 diabetes mellitus with complication, without long-term current use of insulin (HCC) 03/16/2020   Anasarca    Diabetes mellitus (HCC)    Hypokalemia    Vitamin D deficiency    Hypothyroidism    Physical deconditioning    Acute on chronic systolic CHF (congestive heart failure) (HCC) 03/09/2020   Acute on chronic systolic HF (heart failure) (HCC) 03/09/2020   Hyperglycemia 12/22/2018   Hypertensive crisis 04/04/2018   Acute  CHF (congestive heart failure) (HCC) 04/04/2018   Mild renal insufficiency 04/04/2018   Elevated troponin 04/04/2018   Bronchitis    Obesity    Seborrheic dermatitis of scalp 05/23/2016   Essential hypertension 12/02/2015   Depression 12/02/2015   Primary insomnia 12/02/2015   Morbid obesity with body mass index of 50.0-59.9 in adult [facility]) 12/02/2015   Attention deficit hyperactivity disorder (ADHD), predominantly inattentive type 12/02/2015    REFERRING DIAG: Z86.73 (ICD-10-CM) - Personal history of transient ischemic attack (TIA), and cerebral infarction without residual deficits     THERAPY DIAG:  Other lack of coordination  Other symptoms and signs involving the nervous system  Other symptoms and signs involving the musculoskeletal system  Muscle weakness (generalized)  Other abnormalities of gait and mobility  PERTINENT HISTORY:  Essential hypertension;  Hypothyroidism due to Hashimoto's thyroiditis;  CVD (cerebrovascular disease);  History of CVA (cerebrovascular accident);  Type 2 diabetes mellitus with hyperglycemia   PRECAUTIONS: fall   SUBJECTIVE: Patient reports that she is sleepy this afternoon. Reports that she has back pain from sitting in chair from too long.   PAIN:  Are you having pain? Yes,  8/10 initially  OBJECTIVE:        TODAY'S TREATMENT:  09/08/21 Pull to stand at // bar x10-> mini squat x3 Ambulation using Rt UE  platform walker,  3', 5', 10' x2with min A Wheelchair to mat with moda Manual resisted right leg press x30 Manual resisted sidelying hip extension x30 Bridges x10 with cueing for breath control Manual resisted hooklying clamshell x30    08/31/21 Pull to stand at // bar x10  Ambulation using Rt UE platform walker,  5' with min A Attempted use of harnessing system  Sit to stand from wheelchair x10 at [facility]  08/24/2021 WC to mat transfer with mod A today; increased pain levels today require patient to need increased  assistance Sit to supine min to mod Assist Moist heat on low back in supine x 10' for pain  Rolling to prone with CGA Prone lying x 5' Prone to supine Cga Supine to sit min A Mat to WC with min Assist Progress note    08/17/21  Ambulation using Rt UE platform walker, 2 bouts; 5' and 10' with min A  Seated LAQ Rt 5" holds  Supine Bridge 2X10  SLR 2X10 Rt LE  Transfers mat to/from wheelchair with min assist and lateral scooting with VC's  08/15/21 Transfer to standing with hemi walker x2 with min A with 3 feet ambulation to table min A Bridge 2x10  March 2x 10 bilateral  Seated pelvic tilts 2x 20  Seated lateral pelvic tilts 2x 20  Lateral scooting 2 x 4 feet Transfer to standing with hemiwalker x1 with min A with 2 feet ambulation with min/mod A               Italics below from Evaluation on 07/13/2021 DIAGNOSTIC FINDINGS: MRI 08/24/20 IMPRESSION: Acute lacunar type infarct involving the posterior limb of the left internal capsule. Additional small subacute lacunar type infarct involving the left periatrial white matter.             MMT Right 07/13/2021 Left 07/13/2021 Right 08/24/2021 Left 08/24/2021  Hip flexion 3-/5 5/5 3- 5  Hip extension  2-   2+ 4-  Hip abduction  3-      Hip adduction        Hip internal rotation        Hip external rotation        Knee flexion 2/5tested prone 5/5 2+ 5  Knee extension 4-/5 5/5 4 5   Ankle dorsiflexion 3/5 5/5 2- 5  Sit to stand and transfer with mod assist with pt having a posterior lean. Sit to supine with mod assist Supine to side lying to prone with min assist   TX:  sit to stand x 5;                 Sitting:  LAQ x 10 with manual resistance                 Side lying hip abduction x 5                Supine:  bridge x 10                        SLR x 10               Isometric ab/adduction x 10                        Clam x 10                       Dead bug with therapist assist  of RT UE x 10                 Prone:  Knee  flexion, SLR x 10@    07/13/2021  LE ROM:    WFL for tasks assessed    MMT:     MMT Right 07/13/2021 Left 07/13/2021  Hip flexion 3-/5 5/5  Hip extension      Hip abduction      Hip adduction      Hip internal rotation      Hip external rotation      Knee flexion 3+/5 5/5  Knee extension 4-/5 5/5  Ankle dorsiflexion 3/5 5/5   Ankle plantarflexion      Ankle inversion      Ankle eversion      (Blank rows = not tested)     TRANSFERS: Assistive device utilized: Hemi walker and Wheelchair (manual)  Sit to stand: Mod A Stand to sit: Mod A       GAIT: Gait pattern: ataxic Distance walked: 3 feet Assistive device utilized:  hemiwalker Level of assistance:  min/mod assist Comments: unsteady   PATIENT EDUCATION:  Education details: 08/24/2021 progress and plan of care. Patient educated on exam findings, POC, scope of PT, HEP, and increasing activity. 08/04/21 = education on nerve tension, nerve and muscle lengthening, clonus reaction and pressure to stop; and mostly focused on education of increased movement for unweighting "wiggle throughout day"  Person educated: Patient Education method: Explanation, Demonstration, and Handouts Education comprehension: verbalized understanding, returned demonstration, verbal cues required, and tactile cues required      HOME EXERCISE PROGRAM: EValuation:Marching 2x 10                     LAQ 2x 10 5 Second                    Heel/toe raises 2 x 10 5 second hold              07/21/2021  Side lying hip abduction x 5                Supine:  bridge x 10                                SLR x 10        4/11: sidelying forearm push up exercises      GOALS: Goals reviewed with patient? Yes   SHORT TERM GOALS: Target date: 09/14/2021   Patient will be independent with HEP in order to improve functional outcomes. Baseline:  Goal status: ongoing    2.  Patient will report at least 25% improvement in symptoms for improved quality of  life. Baseline:  Goal status:ongoing      LONG TERM GOALS: Target date: 09/21/2021   Patient will report at least 75% improvement in symptoms for improved quality of life. Baseline:  Goal status: ongoing    2.  Patient will be able to perform sit to stand without UE use in order to demonstrate improved functional strength. Baseline: 08/24/21 needs min to mod A today due to increased back pain Goal status: ongoing    3.  Patient will be able to stand/walk for 5 minutes with LRAD use for balance in order to complete household tasks. Baseline: 08/24/21 walking 5' and then 10' ft at recent session.  30 sec to 1 min at a time.  Goal status: ongoing        ASSESSMENT:   CLINICAL IMPRESSION: Patients back pain limits session greatly. Requires multi seated rest breaks. Patient states that she does not get back pain at home and is only getting it when she has to be in the wheelchair to get to therapy. During walking patient is consistently stating that she feels her legs are going to give out on her, on both affected and non affected sides. This limits patient to very limited distances despite being encouraged to continue and that she will not fall with having therapist guard her and having very close wheelchair follow. If reports are true of no pain at home patient may benefit from home PT instead of coming to outpatient therapy. Patient will continue to benefit from physical therapy in order to improve function and reduce impairment.    OBJECTIVE IMPAIRMENTS Abnormal gait, decreased activity tolerance, decreased balance, decreased endurance, decreased knowledge of use of DME, decreased mobility, difficulty walking, decreased ROM, decreased strength, impaired flexibility, impaired UE functional use, improper body mechanics, and obesity.    ACTIVITY LIMITATIONS cleaning, community activity, driving, meal prep, occupation, laundry, yard work, shopping, and yard work.    PERSONAL FACTORS Behavior  pattern, Fitness, Past/current experiences, Time since onset of injury/illness/exacerbation, and 3+ comorbidities: CVA, increased BMI, sedentary   are also affecting patient's functional outcome.      REHAB POTENTIAL: Good   CLINICAL DECISION MAKING: Evolving/moderate complexity   EVALUATION COMPLEXITY: Moderate   PLAN: PT FREQUENCY: 2x/week   PT DURATION: 6 weeks   PLANNED INTERVENTIONS: Therapeutic exercises, Therapeutic activity, Neuromuscular re-education, Balance training, Gait training, Patient/Family education, Joint manipulation, Joint mobilization, Stair training, Orthotic/Fit training, DME instructions, Aquatic Therapy, Dry Needling, Electrical stimulation, Spinal manipulation, Spinal mobilization, Cryotherapy, Moist heat, Compression bandaging, scar mobilization, Splintting, Taping, Traction, Ultrasound, Ionotophoresis 4mg /ml Dexamethasone, and Manual therapy    PLAN FOR NEXT SESSION: request continue therapy 2 x a week x 4 weeks to continue to work on strengthening, bed mobility, gait and transfer training, standing balance and increasing overall functional mobility and decreasing fall risk.    3:30 PM, 09/08/21 Tim Wilhide PT, DPT

## 2021-09-08 NOTE — Therapy (Signed)
OUTPATIENT OCCUPATIONAL THERAPY TREATMENT NOTE   [name] MRN: [id number] DOB:February 28, 1966, 56 y.o., female Today's Date: 09/08/2021  PCP: Toma Deiters, MD REFERRING PROVIDER: Delano Metz, MD  END OF SESSION:   OT End of Session - 09/08/21 1359     Visit Number 5    Number of Visits 24    Date for OT Re-Evaluation 10/31/21    Authorization Type 1) [employer] 2) [place] medicaid [employer]    Authorization Time Period 1) no copay 60 visit limit  2) No auth required for patients over 21 at this time. Adults - 27 visits per calendar year OT/PT/SLP    Authorization - Visit Number 5    Authorization - Number of Visits 60    OT Start Time 1300    OT Stop Time 1345    OT Time Calculation (min) 45 min    Behavior During Therapy WFL for tasks assessed/performed               Past Medical History:  Diagnosis Date   Acute heart failure (HCC)    ADD (attention deficit disorder)    Bronchitis    Depression    Diabetes mellitus without complication (HCC)    Elevated troponin    Hypertension    Obesity    Past Surgical History:  Procedure Laterality Date   CESAREAN SECTION     Patient Active Problem List   Diagnosis Date Noted   Chronic combined systolic and diastolic congestive heart failure (HCC) 08/25/2020   Acute CVA (cerebrovascular accident) (HCC) 08/24/2020   Borderline prolonged QT interval 08/24/2020   Type 2 diabetes mellitus with complication, without long-term current use of insulin (HCC) 03/16/2020   Anasarca    Diabetes mellitus (HCC)    Hypokalemia    Vitamin D deficiency    Hypothyroidism    Physical deconditioning    Acute on chronic systolic CHF (congestive heart failure) (HCC) 03/09/2020   Acute on chronic systolic HF (heart failure) (HCC) 03/09/2020   Hyperglycemia 12/22/2018   Hypertensive crisis 04/04/2018   Acute CHF (congestive heart failure) (HCC) 04/04/2018   Mild renal insufficiency 04/04/2018   Elevated troponin  04/04/2018   Bronchitis    Obesity    Seborrheic dermatitis of scalp 05/23/2016   Essential hypertension 12/02/2015   Depression 12/02/2015   Primary insomnia 12/02/2015   Morbid obesity with body mass index of 50.0-59.9 in adult [facility]) 12/02/2015   Attention deficit hyperactivity disorder (ADHD), predominantly inattentive type 12/02/2015    ONSET DATE: 08/23/20  REFERRING DIAG: Left CVA with right side weakness  THERAPY DIAG:  Other lack of coordination  Other symptoms and signs involving the nervous system  Other symptoms and signs involving the musculoskeletal system  Rationale for Evaluation and Treatment Rehabilitation   PERTINENT HISTORY: Patient sustained an acute lacunar infarct on the left side causing right side weakness. Patient reports that she received therapy services at [facility] for 2.5 weeks, and [facility] OT and PT for 1 month. Does not know the date when she sees Dr. Salvadore Farber for a follow-up. She is currently using a borrowed manual wheelchair from [facility], [place] she is unaware when she needs to return it.   PRECAUTIONS: Fall and Other: right side hemiparesis  SUBJECTIVE: S: I have been good just waiting every day to get better and better.  PAIN:  Are you having pain? No     OBJECTIVE:  UE ROM      Active ROM - seated.  IR/er adducted Right 08/08/2021  Shoulder flexion 0  Shoulder abduction 50  Shoulder internal rotation 60  Shoulder external rotation 0 - UE positioning assist  Elbow flexion 136  Elbow extension -21  Wrist flexion  - gravity eliminated 66  Wrist extension - gravity eliminated 0  Wrist ulnar deviation unable  Wrist radial deviation unable  Wrist pronation unable  Wrist supination 0  (Blank rows = not tested)     UE MMT:      MMT - seated. IR/er adducted Right 08/08/2021  Shoulder flexion 3-/5  Shoulder abduction 3-/5  Shoulder internal rotation 3/5  Shoulder external rotation 3-/5  Elbow flexion 3/5  Elbow extension  3-/5  Wrist flexion 3/5  Wrist extension 3-/5  Wrist ulnar deviation 0/5  Wrist radial deviation 0/5  Wrist pronation 3-/5  Wrist supination 3-/5  (Blank rows = not tested)   HAND FUNCTION: Grip strength: Right: 3 lbs; Left: 58 lbs and Lateral pinch: Right: 4 lbs, Left: 12 lbs   COORDINATION: Unable to test 9 hole peg test  GOALS:     SHORT TERM GOALS: Target date: 09/19/2021   Patient/caregiver will be educated on HEP in order to facilitate her progress in therapy and increase her functional performance when completing ADL tasks and move towards more functional use of her RUE.  Baseline: Goal status: on-going   2.  Patient will complete bathing and dressing tasks at moderate assist level while seated and using a hemi dressing technique.  Baseline:  Goal status: on-going   3.  Patient will increase her RUE A/ROM by 10 degrees where able in order to increase her ability to assist and complete basic ADL tasks.  Baseline:  Goal status:on-going   4.  Patient will demonstrate an increase her right hand coordination in order to complete the 9 hole peg test with assist.  Baseline:  Goal status:on-going   5.  Patient will increase her hand hand grip and pinch strength by 2-3lbs in order to progress towards using her right hand to stabilize items when eating, grooming, etc. Baseline:  Goal status: on-going     LONG TERM GOALS: Target date: 10/31/2021   Patient will complete bathing,dressing, and grooming tasks at min assist level while seated and using a hemi dressing technique.  Baseline:  Goal status:on-going   2.  Patient will be able to demonstrate a tub/shower transfer at min assist using a tub bench in order to return to taking shower with caregiver assisting. Baseline:  Goal status: on-going   3.  Patient will increase her RUE A/ROM in order to use her right arm/hand as an active assist when completing dressing, bathing, and grooming tasks.  Baseline:  Goal status:  on-going   4.  Patient will demonstrate an  increase her right hand coordination by completing the 9 hole peg test without assistance. Baseline:  Goal status: on-going   5.  Patient will increase her right hand grip and pinch strength to 10% of average norms for age in order to progress her ability to use her hand as an active assist.  Baseline:  Goal status: on-going   PATIENT EDUCATION: Education details: AA/ROM shoulder - supine (flexion, protraction), AA/ROM shoulder - seated (table flexion/extension), elbow (table flexion/extension) Person educated: patient, mother Education method: handout, demonstration, verbal cues, tactile cues Education comprehension: verbalized and demonstrated understanding.     HOME EXERCISE PROGRAM: 5/18: AA/ROM - shoulder supine and seated (table), elbow AA/ROM at table.  TODAY'S TREATMENT: 5/26/2  - table  slides with RUE on scooter board to target shoulder flexion, horizontal abduction 10x each direction  - AA/ROM- R hand secured to PVC pipe with green theraband; shoulder protraction, shoulder flexion (min assist from OT to support RUE)  10x -weight bearing through RUE- standing at table top OT assisting for elbow extension and at wrist for hand stability; 2x 1'; additional OT providing min guard assist to L side for standing balance  - weight shifting through RUE for functional activity- pt. standing at edge of table with RUE on table (OT providing support to RUE to extend fingers/wrist); reaching with L hand to retrieve pegs and cross midline to place pegs into peg board.  - A/ROM- wrist flexion (gravity eliminated) - propped on half circle wedge 10x - A/ROM- wrist extension (against gravity) -  propped on half circle wedge 2x 5; rest breaks given     08/31/21 - Functional transfer: from mat table and back to wheelchair: Mod Assist, squat pivot, towards left side each time. - Bed mobility: sit to sidelying: min assist, sidelying to sit: min assist.  VC for technique, assist with placement of RUE.  - AA/ROM: shoulder supine, protraction, flexion, 10X (provided 2 finger assist on right to help with stability and form) - A/ROM: shoulder, supine, IR/er, 10X (provide positioning support at elbow) - A/ROM: elbow, seated, pillowcase used, flexion/extension, 5X  08/10/21 - Splint fabrication: Custom fabricated composite finger extension splint for right hand to wear at night. Three 2 inch straps used to secure forearm and MCP joints. One 1 inch strap applied to secure thumb. Four sockette's provided.        CLINICAL IMPRESSION: P: Pt reports she has been completing HEP exercises and using her LUE to move her RUE. Noted increased tone in RUE, completed weight bearing activity to target spasticity. Noted increased in pain in wrist during weight shifting activity, pain decreased with repositioning. Completed shoulder A/AROM with PVC, OT providing min support to RUE. Issued HEP for wrist flexion and extension. Verbal and tactile assist provided as needed throughout session.   PLAN: OT FREQUENCY: 2x/week   OT DURATION: 12 weeks   PLANNED INTERVENTIONS: self care/ADL training, therapeutic exercise, therapeutic activity, neuromuscular re-education, manual therapy, passive range of motion, balance training, splinting, electrical stimulation, ultrasound, moist heat, cryotherapy, patient/family education, visual/perceptual remediation/compensation, psychosocial skills training, energy conservation, coping strategies training, and DME and/or AE instructions   RECOMMENDED OTHER SERVICES: Manual wheelchair evaluation from mobility specialist   CONSULTED AND AGREED WITH PLAN OF CARE: Patient and family member/caregiver   PLAN FOR NEXT SESSION: P: NMES targeting wrist extension, continue with weight bearing, follow up on HEP      UGI CorporationLeslie Yama Nielson, OTR/L  [phone] 09/08/2021, 2:10 PM

## 2021-09-13 ENCOUNTER — Encounter (HOSPITAL_COMMUNITY): Payer: Self-pay

## 2021-09-13 ENCOUNTER — Encounter (HOSPITAL_COMMUNITY): Payer: Self-pay | Admitting: Occupational Therapy

## 2021-09-13 ENCOUNTER — Ambulatory Visit (HOSPITAL_COMMUNITY): Payer: 59 | Attending: Family Medicine | Admitting: Occupational Therapy

## 2021-09-13 ENCOUNTER — Ambulatory Visit (HOSPITAL_COMMUNITY): Payer: 59

## 2021-09-13 DIAGNOSIS — R29898 Other symptoms and signs involving the musculoskeletal system: Secondary | ICD-10-CM | POA: Insufficient documentation

## 2021-09-13 DIAGNOSIS — R29818 Other symptoms and signs involving the nervous system: Secondary | ICD-10-CM | POA: Insufficient documentation

## 2021-09-13 DIAGNOSIS — Z8673 Personal history of transient ischemic attack (TIA), and cerebral infarction without residual deficits: Secondary | ICD-10-CM | POA: Insufficient documentation

## 2021-09-13 DIAGNOSIS — M6281 Muscle weakness (generalized): Secondary | ICD-10-CM | POA: Insufficient documentation

## 2021-09-13 DIAGNOSIS — R278 Other lack of coordination: Secondary | ICD-10-CM

## 2021-09-13 DIAGNOSIS — R2689 Other abnormalities of gait and mobility: Secondary | ICD-10-CM | POA: Diagnosis not present

## 2021-09-13 DIAGNOSIS — R279 Unspecified lack of coordination: Secondary | ICD-10-CM | POA: Insufficient documentation

## 2021-09-13 NOTE — Therapy (Signed)
OUTPATIENT PHYSICAL THERAPY TREATMENT NOTE  Progress Note Reporting Period 07/13/2021 to 08/24/21  See note below for Objective Data and Assessment of Progress/Goals.       [name] MRN: [id number] DOB:01-22-1966, 56 y.o., female Today's Date: 09/13/2021  PCP: Toma Deiters, MD REFERRING PROVIDER: Vivien Presto, MD  END OF SESSION:   PT End of Session - 09/13/21 1432     Visit Number 13    Number of Visits 20    Date for PT Re-Evaluation 09/21/21    Authorization Type [employer] (60 VL 10 used, 50 remain)    Authorization - Visit Number 13    Authorization - Number of Visits 59    PT Start Time 1432    PT Stop Time 1515    PT Time Calculation (min) 43 min    Equipment Utilized During Treatment Gait belt    Activity Tolerance Patient tolerated treatment well;Patient limited by fatigue    Behavior During Therapy WFL for tasks assessed/performed                Past Medical History:  Diagnosis Date   Acute heart failure (HCC)    ADD (attention deficit disorder)    Bronchitis    Depression    Diabetes mellitus without complication (HCC)    Elevated troponin    Hypertension    Obesity    Past Surgical History:  Procedure Laterality Date   CESAREAN SECTION     Patient Active Problem List   Diagnosis Date Noted   Chronic combined systolic and diastolic congestive heart failure (HCC) 08/25/2020   Acute CVA (cerebrovascular accident) (HCC) 08/24/2020   Borderline prolonged QT interval 08/24/2020   Type 2 diabetes mellitus with complication, without long-term current use of insulin (HCC) 03/16/2020   Anasarca    Diabetes mellitus (HCC)    Hypokalemia    Vitamin D deficiency    Hypothyroidism    Physical deconditioning    Acute on chronic systolic CHF (congestive heart failure) (HCC) 03/09/2020   Acute on chronic systolic HF (heart failure) (HCC) 03/09/2020   Hyperglycemia 12/22/2018   Hypertensive crisis 04/04/2018   Acute  CHF (congestive heart failure) (HCC) 04/04/2018   Mild renal insufficiency 04/04/2018   Elevated troponin 04/04/2018   Bronchitis    Obesity    Seborrheic dermatitis of scalp 05/23/2016   Essential hypertension 12/02/2015   Depression 12/02/2015   Primary insomnia 12/02/2015   Morbid obesity with body mass index of 50.0-59.9 in adult [facility]) 12/02/2015   Attention deficit hyperactivity disorder (ADHD), predominantly inattentive type 12/02/2015    REFERRING DIAG: Z86.73 (ICD-10-CM) - Personal history of transient ischemic attack (TIA), and cerebral infarction without residual deficits     THERAPY DIAG:  Other lack of coordination  Other symptoms and signs involving the nervous system  Muscle weakness (generalized)  PERTINENT HISTORY:  Essential hypertension;  Hypothyroidism due to Hashimoto's thyroiditis;  CVD (cerebrovascular disease);  History of CVA (cerebrovascular accident);  Type 2 diabetes mellitus with hyperglycemia   PRECAUTIONS: fall   SUBJECTIVE: Patient reports that she is not in pain today. Parents brought pt to PT and has not been sitting in chair for too long.    PAIN:  Are you having pain? Yes,  8/10 initially  OBJECTIVE:        TODAY'S TREATMENT:  09/13/21 Pull to stand at // bar 2x10 Ambulation using Rt UE platform walker,  3', attempted second stand Nustep x5 mins, b/l LE, uni R UE  Seated quad extensions at cc with 20# x20   09/08/21 Pull to stand at // bar x10-> mini squat x3 Ambulation using Rt UE platform walker,  3', 5', 10' x2with min A Wheelchair to mat with moda Manual resisted right leg press x30 Manual resisted sidelying hip extension x30 Bridges x10 with cueing for breath control Manual resisted hooklying clamshell x30    08/31/21 Pull to stand at // bar x10  Ambulation using Rt UE platform walker,  5' with min A Attempted use of harnessing system  Sit to stand from wheelchair x10 at [facility]  08/24/2021 WC to mat transfer with mod A  today; increased pain levels today require patient to need increased assistance Sit to supine min to mod Assist Moist heat on low back in supine x 10' for pain  Rolling to prone with CGA Prone lying x 5' Prone to supine Cga Supine to sit min A Mat to WC with min Assist Progress note    08/17/21  Ambulation using Rt UE platform walker, 2 bouts; 5' and 10' with min A  Seated LAQ Rt 5" holds  Supine Bridge 2X10  SLR 2X10 Rt LE  Transfers mat to/from wheelchair with min assist and lateral scooting with VC's  08/15/21 Transfer to standing with hemi walker x2 with min A with 3 feet ambulation to table min A Bridge 2x10  March 2x 10 bilateral  Seated pelvic tilts 2x 20  Seated lateral pelvic tilts 2x 20  Lateral scooting 2 x 4 feet Transfer to standing with hemiwalker x1 with min A with 2 feet ambulation with min/mod A               Italics below from Evaluation on 07/13/2021 DIAGNOSTIC FINDINGS: MRI 08/24/20 IMPRESSION: Acute lacunar type infarct involving the posterior limb of the left internal capsule. Additional small subacute lacunar type infarct involving the left periatrial white matter.             MMT Right 07/13/2021 Left 07/13/2021 Right 08/24/2021 Left 08/24/2021  Hip flexion 3-/5 5/5 3- 5  Hip extension  2-   2+ 4-  Hip abduction  3-      Hip adduction        Hip internal rotation        Hip external rotation        Knee flexion 2/5tested prone 5/5 2+ 5  Knee extension 4-/5 5/5 4 5   Ankle dorsiflexion 3/5 5/5 2- 5  Sit to stand and transfer with mod assist with pt having a posterior lean. Sit to supine with mod assist Supine to side lying to prone with min assist   TX:  sit to stand x 5;                 Sitting:  LAQ x 10 with manual resistance                 Side lying hip abduction x 5                Supine:  bridge x 10                        SLR x 10               Isometric ab/adduction x 10                        Clam x 10  Dead bug  with therapist assist of RT UE x 10                 Prone:  Knee flexion, SLR x 10@    07/13/2021  LE ROM:    WFL for tasks assessed    MMT:     MMT Right 07/13/2021 Left 07/13/2021  Hip flexion 3-/5 5/5  Hip extension      Hip abduction      Hip adduction      Hip internal rotation      Hip external rotation      Knee flexion 3+/5 5/5  Knee extension 4-/5 5/5  Ankle dorsiflexion 3/5 5/5   Ankle plantarflexion      Ankle inversion      Ankle eversion      (Blank rows = not tested)     TRANSFERS: Assistive device utilized: Hemi walker and Wheelchair (manual)  Sit to stand: Mod A Stand to sit: Mod A       GAIT: Gait pattern: ataxic Distance walked: 3 feet Assistive device utilized:  hemiwalker Level of assistance:  min/mod assist Comments: unsteady   PATIENT EDUCATION:  Education details: 08/24/2021 progress and plan of care. Patient educated on exam findings, POC, scope of PT, HEP, and increasing activity. 08/04/21 = education on nerve tension, nerve and muscle lengthening, clonus reaction and pressure to stop; and mostly focused on education of increased movement for unweighting "wiggle throughout day"  Person educated: Patient Education method: Explanation, Demonstration, and Handouts Education comprehension: verbalized understanding, returned demonstration, verbal cues required, and tactile cues required      HOME EXERCISE PROGRAM: EValuation:Marching 2x 10                     LAQ 2x 10 5 Second                    Heel/toe raises 2 x 10 5 second hold              07/21/2021  Side lying hip abduction x 5                Supine:  bridge x 10                                SLR x 10        4/11: sidelying forearm push up exercises      GOALS: Goals reviewed with patient? Yes   SHORT TERM GOALS: Target date: 09/14/2021   Patient will be independent with HEP in order to improve functional outcomes. Baseline:  Goal status: ongoing    2.  Patient will report at  least 25% improvement in symptoms for improved quality of life. Baseline:  Goal status:ongoing      LONG TERM GOALS: Target date: 09/21/2021   Patient will report at least 75% improvement in symptoms for improved quality of life. Baseline:  Goal status: ongoing    2.  Patient will be able to perform sit to stand without UE use in order to demonstrate improved functional strength. Baseline: 08/24/21 needs min to mod A today due to increased back pain Goal status: ongoing    3.  Patient will be able to stand/walk for 5 minutes with LRAD use for balance in order to complete household tasks. Baseline: 08/24/21 walking 5' and then 10' ft at recent session.  30 sec to  1 min at a time.  Goal status: ongoing        ASSESSMENT:   CLINICAL IMPRESSION: Patients back pain limits session greatly. Requires multi seated rest breaks. Patient only tolerates walking aprox 3 feet today. Pt able to complete nustep without issues. Pt reports that knee extensions at cc are tough and needs constant encouragement to continue despite being successful in the motion. Patient states that she is going to separate her therapy appointments to different days. Mentioned home PT as pt continuously having pain in outpatient clinic is limiting therapeutic benefit.  Patient will continue to benefit from physical therapy in order to improve function and reduce impairment.    OBJECTIVE IMPAIRMENTS Abnormal gait, decreased activity tolerance, decreased balance, decreased endurance, decreased knowledge of use of DME, decreased mobility, difficulty walking, decreased ROM, decreased strength, impaired flexibility, impaired UE functional use, improper body mechanics, and obesity.    ACTIVITY LIMITATIONS cleaning, community activity, driving, meal prep, occupation, laundry, yard work, shopping, and yard work.    PERSONAL FACTORS Behavior pattern, Fitness, Past/current experiences, Time since onset of injury/illness/exacerbation,  and 3+ comorbidities: CVA, increased BMI, sedentary   are also affecting patient's functional outcome.      REHAB POTENTIAL: Good   CLINICAL DECISION MAKING: Evolving/moderate complexity   EVALUATION COMPLEXITY: Moderate   PLAN: PT FREQUENCY: 2x/week   PT DURATION: 6 weeks   PLANNED INTERVENTIONS: Therapeutic exercises, Therapeutic activity, Neuromuscular re-education, Balance training, Gait training, Patient/Family education, Joint manipulation, Joint mobilization, Stair training, Orthotic/Fit training, DME instructions, Aquatic Therapy, Dry Needling, Electrical stimulation, Spinal manipulation, Spinal mobilization, Cryotherapy, Moist heat, Compression bandaging, scar mobilization, Splintting, Taping, Traction, Ultrasound, Ionotophoresis 4mg /ml Dexamethasone, and Manual therapy    PLAN FOR NEXT SESSION: continue to work on strengthening, bed mobility, gait and transfer training, standing balance and increasing overall functional mobility and decreasing fall risk.    3:18 PM, 09/13/21 Ayven Pheasant PT, DPT

## 2021-09-13 NOTE — Therapy (Signed)
OUTPATIENT OCCUPATIONAL THERAPY TREATMENT NOTE   [name] MRN: [id number] DOB:[birth year], 56 y.o., female Today's Date: 09/13/2021  PCP: Toma DeitersHasanaj, Xaje A, MD REFERRING PROVIDER: Delano Metzorrington, Kip, MD  END OF SESSION:   OT End of Session - 09/13/21 1436     Visit Number 6    Number of Visits 24    Date for OT Re-Evaluation 10/31/21    Authorization Type 1) [employer] 2) [place] medicaid [employer]    Authorization Time Period 1) no copay 60 visit limit  2) No auth required for patients over 21 at this time. Adults - 27 visits per calendar year OT/PT/SLP    Authorization - Visit Number 6    Authorization - Number of Visits 60    OT Start Time 1350    OT Stop Time 1430    OT Time Calculation (min) 40 min    Behavior During Therapy WFL for tasks assessed/performed                Past Medical History:  Diagnosis Date   Acute heart failure (HCC)    ADD (attention deficit disorder)    Bronchitis    Depression    Diabetes mellitus without complication (HCC)    Elevated troponin    Hypertension    Obesity    Past Surgical History:  Procedure Laterality Date   CESAREAN SECTION     Patient Active Problem List   Diagnosis Date Noted   Chronic combined systolic and diastolic congestive heart failure (HCC) 08/25/2020   Acute CVA (cerebrovascular accident) (HCC) 08/24/2020   Borderline prolonged QT interval 08/24/2020   Type 2 diabetes mellitus with complication, without long-term current use of insulin (HCC) 03/16/2020   Anasarca    Diabetes mellitus (HCC)    Hypokalemia    Vitamin D deficiency    Hypothyroidism    Physical deconditioning    Acute on chronic systolic CHF (congestive heart failure) (HCC) 03/09/2020   Acute on chronic systolic HF (heart failure) (HCC) 03/09/2020   Hyperglycemia 12/22/2018   Hypertensive crisis 04/04/2018   Acute CHF (congestive heart failure) (HCC) 04/04/2018   Mild renal insufficiency 04/04/2018   Elevated troponin  04/04/2018   Bronchitis    Obesity    Seborrheic dermatitis of scalp 05/23/2016   Essential hypertension 12/02/2015   Depression 12/02/2015   Primary insomnia 12/02/2015   Morbid obesity with body mass index of 50.0-59.9 in adult [facility](HCC) 12/02/2015   Attention deficit hyperactivity disorder (ADHD), predominantly inattentive type 12/02/2015    ONSET DATE: 08/23/20  REFERRING DIAG: Left CVA with right side weakness  THERAPY DIAG:  Other lack of coordination  Other symptoms and signs involving the nervous system  Other symptoms and signs involving the musculoskeletal system  Rationale for Evaluation and Treatment Rehabilitation   PERTINENT HISTORY: Patient sustained an acute lacunar infarct on the left side causing right side weakness. Patient [facility] and [facility] OT and PT for 1 month. Does not know the date when she sees Dr. Salvadore Farberorrington for a follow-up. She is currently using a borrowed manual wheelchair from [facility]and she is unaware when she needs to return it.   PRECAUTIONS: Fall and Other: right side hemiparesis  SUBJECTIVE: S: I've been doing the new exercises.   PAIN:  Are you having pain? No     OBJECTIVE:  UE ROM      Active ROM - seated. IR/er adducted Right 08/08/2021  Shoulder flexion 0  Shoulder abduction 50  Shoulder internal rotation 60  Shoulder external rotation 0 - UE positioning assist  Elbow flexion 136  Elbow extension -21  Wrist flexion  - gravity eliminated 66  Wrist extension - gravity eliminated 0  Wrist ulnar deviation unable  Wrist radial deviation unable  Wrist pronation unable  Wrist supination 0  (Blank rows = not tested)     UE MMT:      MMT - seated. IR/er adducted Right 08/08/2021  Shoulder flexion 3-/5  Shoulder abduction 3-/5  Shoulder internal rotation 3/5  Shoulder external rotation 3-/5  Elbow flexion 3/5  Elbow extension 3-/5  Wrist flexion 3/5   Wrist extension 3-/5  Wrist ulnar deviation 0/5  Wrist radial deviation 0/5  Wrist pronation 3-/5  Wrist supination 3-/5  (Blank rows = not tested)   HAND FUNCTION: Grip strength: Right: 3 lbs; Left: 58 lbs and Lateral pinch: Right: 4 lbs, Left: 12 lbs   COORDINATION: Unable to test 9 hole peg test  GOALS:     SHORT TERM GOALS: Target date: 09/19/2021   Patient/caregiver will be educated on HEP in order to facilitate her progress in therapy and increase her functional performance when completing ADL tasks and move towards more functional use of her RUE.  Baseline: Goal status: on-going   2.  Patient will complete bathing and dressing tasks at moderate assist level while seated and using a hemi dressing technique.  Baseline:  Goal status: on-going   3.  Patient will increase her RUE A/ROM by 10 degrees where able in order to increase her ability to assist and complete basic ADL tasks.  Baseline:  Goal status:on-going   4.  Patient will demonstrate an increase her right hand coordination in order to complete the 9 hole peg test with assist.  Baseline:  Goal status:on-going   5.  Patient will increase her hand hand grip and pinch strength by 2-3lbs in order to progress towards using her right hand to stabilize items when eating, grooming, etc. Baseline:  Goal status: on-going     LONG TERM GOALS: Target date: 10/31/2021   Patient will complete bathing,dressing, and grooming tasks at min assist level while seated and using a hemi dressing technique.  Baseline:  Goal status:on-going   2.  Patient will be able to demonstrate a tub/shower transfer at min assist using a tub bench in order to return to taking shower with caregiver assisting. Baseline:  Goal status: on-going   3.  Patient will increase her RUE A/ROM in order to use her right arm/hand as an active assist when completing dressing, bathing, and grooming tasks.  Baseline:  Goal status: on-going   4.  Patient  will demonstrate an  increase her right hand coordination by completing the 9 hole peg test without assistance. Baseline:  Goal status: on-going   5.  Patient will increase her right hand grip and pinch strength to 10% of average norms for age in order to progress her ability to use her hand as an active assist.  Baseline:  Goal status: on-going   PATIENT EDUCATION: Education details: discussed HEP completion Person educated: patient, mother Education method: explanation Education comprehension: verbalized understanding     HOME EXERCISE PROGRAM: 5/18: AA/ROM - shoulder supine and seated (table), elbow AA/ROM at table.  TODAY'S TREATMENT: 09/13/21 - table slides with RUE on towel to target shoulder flexion, diagonal to the left 10x each direction  - AA/ROM- R hand secured to PVC pipe  with green theraband; shoulder protraction, shoulder flexion (min assist from OT to support RUE)  2 sets of 10 repetitions, rest breaks as needed -NMES: targeting wrist extensors, [place], 34 mA CC, 10'    5/26/2 - table slides with RUE on scooter board to target shoulder flexion, horizontal abduction 10x each direction  - AA/ROM- R hand secured to PVC pipe with green theraband; shoulder protraction, shoulder flexion (min assist from OT to support RUE)  10x -weight bearing through RUE- standing at table top OT assisting for elbow extension and at wrist for hand stability; 2x 1'; additional OT providing min guard assist to L side for standing balance  - weight shifting through RUE for functional activity- pt. standing at edge of table with RUE on table (OT providing support to RUE to extend fingers/wrist); reaching with L hand to retrieve pegs and cross midline to place pegs into peg board.  - A/ROM- wrist flexion (gravity eliminated) - propped on half circle wedge 10x - A/ROM- wrist extension (against gravity) -  propped on half circle wedge 2x 5; rest breaks given     08/31/21 - Functional transfer:  from mat table and back to wheelchair: Mod Assist, squat pivot, towards left side each time. - Bed mobility: sit to sidelying: min assist, sidelying to sit: min assist. VC for technique, assist with placement of RUE.  - AA/ROM: shoulder supine, protraction, flexion, 10X (provided 2 finger assist on right to help with stability and form) - A/ROM: shoulder, supine, IR/er, 10X (provide positioning support at elbow) - A/ROM: elbow, seated, pillowcase used, flexion/extension, 5X        CLINICAL IMPRESSION: P: Pt reports she has been completing HEP exercises with success. Continued with shoulder AA/ROM today with table slides and PVC pipe, pt was able to progress to towel roll versus scooter board. Utilized NMES for wrist extension with great results. Verbal and tactile assist provided as needed throughout session.   PLAN: OT FREQUENCY: 2x/week   OT DURATION: 12 weeks   PLANNED INTERVENTIONS: self care/ADL training, therapeutic exercise, therapeutic activity, neuromuscular re-education, manual therapy, passive range of motion, balance training, splinting, electrical stimulation, ultrasound, moist heat, cryotherapy, patient/family education, visual/perceptual remediation/compensation, psychosocial skills training, energy conservation, coping strategies training, and DME and/or AE instructions   RECOMMENDED OTHER SERVICES: Manual wheelchair evaluation from mobility specialist   CONSULTED AND AGREED WITH PLAN OF CARE: Patient and family member/caregiver   PLAN FOR NEXT SESSION: P: Continue with NMES, weightbearing, follow up on [employer], OTR/L  [phone] 09/13/2021, 2:38 PM

## 2021-09-15 ENCOUNTER — Ambulatory Visit (HOSPITAL_COMMUNITY): Payer: 59 | Attending: Family Medicine | Admitting: Occupational Therapy

## 2021-09-15 ENCOUNTER — Encounter (HOSPITAL_COMMUNITY): Payer: Self-pay | Admitting: Occupational Therapy

## 2021-09-15 ENCOUNTER — Ambulatory Visit (HOSPITAL_COMMUNITY): Payer: 59

## 2021-09-15 ENCOUNTER — Encounter (HOSPITAL_COMMUNITY): Payer: Self-pay

## 2021-09-15 DIAGNOSIS — R2689 Other abnormalities of gait and mobility: Secondary | ICD-10-CM | POA: Insufficient documentation

## 2021-09-15 DIAGNOSIS — M6281 Muscle weakness (generalized): Secondary | ICD-10-CM

## 2021-09-15 DIAGNOSIS — R29818 Other symptoms and signs involving the nervous system: Secondary | ICD-10-CM | POA: Insufficient documentation

## 2021-09-15 DIAGNOSIS — R279 Unspecified lack of coordination: Secondary | ICD-10-CM | POA: Diagnosis not present

## 2021-09-15 DIAGNOSIS — R278 Other lack of coordination: Secondary | ICD-10-CM

## 2021-09-15 DIAGNOSIS — Z8673 Personal history of transient ischemic attack (TIA), and cerebral infarction without residual deficits: Secondary | ICD-10-CM | POA: Diagnosis not present

## 2021-09-15 DIAGNOSIS — R29898 Other symptoms and signs involving the musculoskeletal system: Secondary | ICD-10-CM

## 2021-09-15 NOTE — Therapy (Signed)
OUTPATIENT OCCUPATIONAL THERAPY TREATMENT NOTE   Patient Name: Connie CapronDenise Buechler MRN: [id number] DOB:02-03-1966, 56 y.o., female Today's Date: 09/15/2021  PCP: Toma DeitersHasanaj, Xaje A, MD REFERRING PROVIDER: Delano Metzorrington, Kip, MD  END OF SESSION:   OT End of Session - 09/15/21 1524     Visit Number 7    Number of Visits 24    Date for OT Re-Evaluation 10/31/21    Authorization Type 1) [employer] 2) [place] medicaid [employer]    Authorization Time Period 1) no copay 60 visit limit  2) No auth required for patients over 21 at this time. Adults - 27 visits per calendar year OT/PT/SLP    Authorization - Visit Number 7    Authorization - Number of Visits 60    OT Start Time 1430    OT Stop Time 1517    OT Time Calculation (min) 47 min    Activity Tolerance Patient tolerated treatment well    Behavior During Therapy WFL for tasks assessed/performed                 Past Medical History:  Diagnosis Date   Acute heart failure (HCC)    ADD (attention deficit disorder)    Bronchitis    Depression    Diabetes mellitus without complication (HCC)    Elevated troponin    Hypertension    Obesity    Past Surgical History:  Procedure Laterality Date   CESAREAN SECTION     Patient Active Problem List   Diagnosis Date Noted   Chronic combined systolic and diastolic congestive heart failure (HCC) 08/25/2020   Acute CVA (cerebrovascular accident) (HCC) 08/24/2020   Borderline prolonged QT interval 08/24/2020   Type 2 diabetes mellitus with complication, without long-term current use of insulin (HCC) 03/16/2020   Anasarca    Diabetes mellitus (HCC)    Hypokalemia    Vitamin D deficiency    Hypothyroidism    Physical deconditioning    Acute on chronic systolic CHF (congestive heart failure) (HCC) 03/09/2020   Acute on chronic systolic HF (heart failure) (HCC) 03/09/2020   Hyperglycemia 12/22/2018   Hypertensive crisis 04/04/2018   Acute CHF (congestive heart failure) (HCC) 04/04/2018   Mild  renal insufficiency 04/04/2018   Elevated troponin 04/04/2018   Bronchitis    Obesity    Seborrheic dermatitis of scalp 05/23/2016   Essential hypertension 12/02/2015   Depression 12/02/2015   Primary insomnia 12/02/2015   Morbid obesity with body mass index of 50.0-59.9 in adult [facility](HCC) 12/02/2015   Attention deficit hyperactivity disorder (ADHD), predominantly inattentive type 12/02/2015    ONSET DATE: 08/23/20  REFERRING DIAG: Left CVA with right side weakness  THERAPY DIAG:  Other lack of coordination  Other symptoms and signs involving the nervous system  Muscle weakness (generalized)  Other symptoms and signs involving the musculoskeletal system  Rationale for Evaluation and Treatment Rehabilitation   PERTINENT HISTORY: Patient sustained an acute lacunar infarct on the left side causing right side weakness. [facility] and [facility] OT and PT for 1 month. Does not know the date when she sees Dr. Salvadore Farberorrington for a follow-up. She is currently using a borrowed manual wheelchair from [facility]([place]and she is unaware when she needs to return it.   PRECAUTIONS: Fall and Other: right side hemiparesis  SUBJECTIVE: S: I haven't done my exercises I have been so tired  PAIN:  Are you having pain? No     OBJECTIVE:  UE ROM      Active ROM - seated. IR/er adducted Right 08/08/2021  Shoulder flexion 0  Shoulder abduction 50  Shoulder internal rotation 60  Shoulder external rotation 0 - UE positioning assist  Elbow flexion 136  Elbow extension -21  Wrist flexion  - gravity eliminated 66  Wrist extension - gravity eliminated 0  Wrist ulnar deviation unable  Wrist radial deviation unable  Wrist pronation unable  Wrist supination 0  (Blank rows = not tested)     UE MMT:      MMT - seated. IR/er adducted Right 08/08/2021  Shoulder flexion 3-/5  Shoulder abduction 3-/5  Shoulder internal rotation 3/5   Shoulder external rotation 3-/5  Elbow flexion 3/5  Elbow extension 3-/5  Wrist flexion 3/5  Wrist extension 3-/5  Wrist ulnar deviation 0/5  Wrist radial deviation 0/5  Wrist pronation 3-/5  Wrist supination 3-/5  (Blank rows = not tested)   HAND FUNCTION: Grip strength: Right: 3 lbs; Left: 58 lbs and Lateral pinch: Right: 4 lbs, Left: 12 lbs   COORDINATION: Unable to test 9 hole peg test  GOALS:     SHORT TERM GOALS: Target date: 09/19/2021   Patient/caregiver will be educated on HEP in order to facilitate her progress in therapy and increase her functional performance when completing ADL tasks and move towards more functional use of her RUE.  Baseline: Goal status: on-going   2.  Patient will complete bathing and dressing tasks at moderate assist level while seated and using a hemi dressing technique.  Baseline:  Goal status: on-going   3.  Patient will increase her RUE A/ROM by 10 degrees where able in order to increase her ability to assist and complete basic ADL tasks.  Baseline:  Goal status:on-going   4.  Patient will demonstrate an increase her right hand coordination in order to complete the 9 hole peg test with assist.  Baseline:  Goal status:on-going   5.  Patient will increase her hand hand grip and pinch strength by 2-3lbs in order to progress towards using her right hand to stabilize items when eating, grooming, etc. Baseline:  Goal status: on-going     LONG TERM GOALS: Target date: 10/31/2021   Patient will complete bathing,dressing, and grooming tasks at min assist level while seated and using a hemi dressing technique.  Baseline:  Goal status:on-going   2.  Patient will be able to demonstrate a tub/shower transfer at min assist using a tub bench in order to return to taking shower with caregiver assisting. Baseline:  Goal status: on-going   3.  Patient will increase her RUE A/ROM in order to use her right arm/hand as an active assist when  completing dressing, bathing, and grooming tasks.  Baseline:  Goal status: on-going   4.  Patient will demonstrate an  increase her right hand coordination by completing the 9 hole peg test without assistance. Baseline:  Goal status: on-going   5.  Patient will increase her right hand grip and pinch strength to 10% of average norms for age in order to progress her ability to use her hand as an active assist.  Baseline:  Goal status: on-going   PATIENT EDUCATION: Education details: discussed HEP completion Person educated: patient, mother Education method: explanation Education comprehension: verbalized understanding     HOME EXERCISE PROGRAM: 5/18: AA/ROM - shoulder supine and seated (table), elbow AA/ROM at table.  TODAY'S TREATMENT:    09/15/21 - table slides with RUE on towel to  target shoulder flexion, diagonal to the left and right 10x each direction  - AA/ROM- R hand secured to PVC pipe with green theraband; shoulder protraction, overhead press (min assist from OT to support RUE) 2x 10 repetitions, rest breaks as needed (one set completed prior to weight bearing exercise and once post weight bearing exercise)  - Weight bearing exercise: in standing at edge of table, bearing weight through R UE for approx. 2 mins (OT providing support to RUE) reaching with LUE to retrieve clips and cross midline to place on clip tree.  - NMES: targeting wrist extensors, [place], 30 mA CC, 10'    09/13/21 - table slides with RUE on towel to target shoulder flexion, diagonal to the left 10x each direction  - AA/ROM- R hand secured to PVC pipe with green theraband; shoulder protraction, shoulder flexion (min assist from OT to support RUE)  2 sets of 10 repetitions, rest breaks as needed -NMES: targeting wrist extensors, [place], 34 mA CC, 10'    5/26/2 - table slides with RUE on scooter board to target shoulder flexion, horizontal abduction 10x each direction  - AA/ROM- R hand secured to PVC  pipe with green theraband; shoulder protraction, shoulder flexion (min assist from OT to support RUE)  10x -weight bearing through RUE- standing at table top OT assisting for elbow extension and at wrist for hand stability; 2x 1'; additional OT providing min guard assist to L side for standing balance  - weight shifting through RUE for functional activity- pt. standing at edge of table with RUE on table (OT providing support to RUE to extend fingers/wrist); reaching with L hand to retrieve pegs and cross midline to place pegs into peg board.  - A/ROM- wrist flexion (gravity eliminated) - propped on half circle wedge 10x - A/ROM- wrist extension (against gravity) -  propped on half circle wedge 2x 5; rest breaks given          CLINICAL IMPRESSION: P: Pt reports she has not been completing HEP exercises this week due to exhaustion. Required min assist for sit to stand t/f's throughout this session Continued with shoulder AA/ROM today with table slides and PVC pipe, completed towel slides again without scooter board again this session. Improved tone noted in RUE throughout PVC pipe ROM after weight bearing exercise. Utilized NMES for wrist extension with great results, noted decrease in mA CC from 34 to 30 this session. Educated patient on NMES purchase options for home. Verbal and tactile assist provided as needed throughout session.   PLAN: OT FREQUENCY: 2x/week   OT DURATION: 12 weeks   PLANNED INTERVENTIONS: self care/ADL training, therapeutic exercise, therapeutic activity, neuromuscular re-education, manual therapy, passive range of motion, balance training, splinting, electrical stimulation, ultrasound, moist heat, cryotherapy, patient/family education, visual/perceptual remediation/compensation, psychosocial skills training, energy conservation, coping strategies training, and DME and/or AE instructions   RECOMMENDED OTHER SERVICES: Manual wheelchair evaluation from mobility specialist    CONSULTED AND AGREED WITH PLAN OF CARE: Patient and family member/caregiver   PLAN FOR NEXT SESSION: P: Continue with NMES, weightbearing, follow up on HEP    Lurena Joiner, OTR/L  [phone] 09/15/2021, 3:25 PM

## 2021-09-15 NOTE — Therapy (Signed)
OUTPATIENT PHYSICAL THERAPY TREATMENT NOTE  Progress Note Reporting Period 07/13/2021 to 08/24/21  See note below for Objective Data and Assessment of Progress/Goals.       [name] MRN: [id number] DOB:Feb 23, 1966, 56 y.o., female Today's Date: 09/15/2021  PCP: Toma Deiters, MD REFERRING PROVIDER: Vivien Presto, MD  END OF SESSION:   PT End of Session - 09/15/21 1604     Visit Number 14    Number of Visits 20    Date for PT Re-Evaluation 09/21/21    Authorization Type [employer] (60 VL 10 used, 50 remain)    Authorization - Visit Number 14    Authorization - Number of Visits 59    PT Start Time 1518    PT Stop Time 1556    PT Time Calculation (min) 38 min    Equipment Utilized During Treatment Gait belt    Activity Tolerance Patient tolerated treatment well;Patient limited by fatigue    Behavior During Therapy WFL for tasks assessed/performed                Past Medical History:  Diagnosis Date   Acute heart failure (HCC)    ADD (attention deficit disorder)    Bronchitis    Depression    Diabetes mellitus without complication (HCC)    Elevated troponin    Hypertension    Obesity    Past Surgical History:  Procedure Laterality Date   CESAREAN SECTION     Patient Active Problem List   Diagnosis Date Noted   Chronic combined systolic and diastolic congestive heart failure (HCC) 08/25/2020   Acute CVA (cerebrovascular accident) (HCC) 08/24/2020   Borderline prolonged QT interval 08/24/2020   Type 2 diabetes mellitus with complication, without long-term current use of insulin (HCC) 03/16/2020   Anasarca    Diabetes mellitus (HCC)    Hypokalemia    Vitamin D deficiency    Hypothyroidism    Physical deconditioning    Acute on chronic systolic CHF (congestive heart failure) (HCC) 03/09/2020   Acute on chronic systolic HF (heart failure) (HCC) 03/09/2020   Hyperglycemia 12/22/2018   Hypertensive crisis 04/04/2018   Acute  CHF (congestive heart failure) (HCC) 04/04/2018   Mild renal insufficiency 04/04/2018   Elevated troponin 04/04/2018   Bronchitis    Obesity    Seborrheic dermatitis of scalp 05/23/2016   Essential hypertension 12/02/2015   Depression 12/02/2015   Primary insomnia 12/02/2015   Morbid obesity with body mass index of 50.0-59.9 in adult [facility]) 12/02/2015   Attention deficit hyperactivity disorder (ADHD), predominantly inattentive type 12/02/2015    REFERRING DIAG: Z86.73 (ICD-10-CM) - Personal history of transient ischemic attack (TIA), and cerebral infarction without residual deficits     THERAPY DIAG:  Other lack of coordination  Other symptoms and signs involving the nervous system  Muscle weakness (generalized)  Other symptoms and signs involving the musculoskeletal system  Other abnormalities of gait and mobility  PERTINENT HISTORY:  Essential hypertension;  Hypothyroidism due to Hashimoto's thyroiditis;  CVD (cerebrovascular disease);  History of CVA (cerebrovascular accident);  Type 2 diabetes mellitus with hyperglycemia   PRECAUTIONS: fall   SUBJECTIVE: Patient reports that she is doing better today. Received from OT trying to utilize clinics wheelchair instead of her current own chair.    PAIN:  Are you having pain? Yes,  8/10 initially  OBJECTIVE:        TODAY'S TREATMENT:  09/15/21 Pull to stand at // bar 2x5 Ambulation using Rt UE platform  walker, 10', 20', 10' walks with rest between Leg press left leg 3 plates U13, right leg 2 plates K44 with cues to control knee hyperextension  09/13/21 Pull to stand at // bar 2x10 Ambulation using Rt UE platform walker,  3', attempted second stand Nustep x5 mins, b/l LE, uni R UE Seated quad extensions at cc with 20# x20   09/08/21 Pull to stand at // bar x10-> mini squat x3 Ambulation using Rt UE platform walker,  3', 5', 10' x2with min A Wheelchair to mat with moda Manual resisted right leg press x30 Manual  resisted sidelying hip extension x30 Bridges x10 with cueing for breath control Manual resisted hooklying clamshell x30    08/31/21 Pull to stand at // bar x10  Ambulation using Rt UE platform walker,  5' with min A Attempted use of harnessing system  Sit to stand from wheelchair x10 at [facility]  08/24/2021 WC to mat transfer with mod A today; increased pain levels today require patient to need increased assistance Sit to supine min to mod Assist Moist heat on low back in supine x 10' for pain  Rolling to prone with CGA Prone lying x 5' Prone to supine Cga Supine to sit min A Mat to WC with min Assist Progress note    08/17/21  Ambulation using Rt UE platform walker, 2 bouts; 5' and 10' with min A  Seated LAQ Rt 5" holds  Supine Bridge 2X10  SLR 2X10 Rt LE  Transfers mat to/from wheelchair with min assist and lateral scooting with VC's  08/15/21 Transfer to standing with hemi walker x2 with min A with 3 feet ambulation to table min A Bridge 2x10  March 2x 10 bilateral  Seated pelvic tilts 2x 20  Seated lateral pelvic tilts 2x 20  Lateral scooting 2 x 4 feet Transfer to standing with hemiwalker x1 with min A with 2 feet ambulation with min/mod A               Italics below from Evaluation on 07/13/2021 DIAGNOSTIC FINDINGS: MRI 08/24/20 IMPRESSION: Acute lacunar type infarct involving the posterior limb of the left internal capsule. Additional small subacute lacunar type infarct involving the left periatrial white matter.             MMT Right 07/13/2021 Left 07/13/2021 Right 08/24/2021 Left 08/24/2021  Hip flexion 3-/5 5/5 3- 5  Hip extension  2-   2+ 4-  Hip abduction  3-      Hip adduction        Hip internal rotation        Hip external rotation        Knee flexion 2/5tested prone 5/5 2+ 5  Knee extension 4-/5 5/5 4 5   Ankle dorsiflexion 3/5 5/5 2- 5  Sit to stand and transfer with mod assist with pt having a posterior lean. Sit to supine with mod assist Supine to  side lying to prone with min assist   TX:  sit to stand x 5;                 Sitting:  LAQ x 10 with manual resistance                 Side lying hip abduction x 5                Supine:  bridge x 10  SLR x 10               Isometric ab/adduction x 10                        Clam x 10                       Dead bug with therapist assist of RT UE x 10                 Prone:  Knee flexion, SLR x 10@    07/13/2021  LE ROM:    WFL for tasks assessed    MMT:     MMT Right 07/13/2021 Left 07/13/2021  Hip flexion 3-/5 5/5  Hip extension      Hip abduction      Hip adduction      Hip internal rotation      Hip external rotation      Knee flexion 3+/5 5/5  Knee extension 4-/5 5/5  Ankle dorsiflexion 3/5 5/5   Ankle plantarflexion      Ankle inversion      Ankle eversion      (Blank rows = not tested)     TRANSFERS: Assistive device utilized: Hemi walker and Wheelchair (manual)  Sit to stand: Mod A Stand to sit: Mod A       GAIT: Gait pattern: ataxic Distance walked: 3 feet Assistive device utilized:  hemiwalker Level of assistance:  min/mod assist Comments: unsteady   PATIENT EDUCATION:  Education details: 08/24/2021 progress and plan of care. Patient educated on exam findings, POC, scope of PT, HEP, and increasing activity. 08/04/21 = education on nerve tension, nerve and muscle lengthening, clonus reaction and pressure to stop; and mostly focused on education of increased movement for unweighting "wiggle throughout day"  Person educated: Patient Education method: Explanation, Demonstration, and Handouts Education comprehension: verbalized understanding, returned demonstration, verbal cues required, and tactile cues required      HOME EXERCISE PROGRAM: EValuation:Marching 2x 10                     LAQ 2x 10 5 Second                    Heel/toe raises 2 x 10 5 second hold              07/21/2021  Side lying hip abduction x 5                Supine:   bridge x 10                                SLR x 10        4/11: sidelying forearm push up exercises      GOALS: Goals reviewed with patient? Yes   SHORT TERM GOALS: Target date: 09/14/2021   Patient will be independent with HEP in order to improve functional outcomes. Baseline:  Goal status: ongoing    2.  Patient will report at least 25% improvement in symptoms for improved quality of life. Baseline:  Goal status:ongoing      LONG TERM GOALS: Target date: 09/21/2021   Patient will report at least 75% improvement in symptoms for improved quality of life. Baseline:  Goal status: ongoing    2.  Patient will be able to perform sit  to stand without UE use in order to demonstrate improved functional strength. Baseline: 08/24/21 needs min to mod A today due to increased back pain Goal status: ongoing    3.  Patient will be able to stand/walk for 5 minutes with LRAD use for balance in order to complete household tasks. Baseline: 08/24/21 walking 5' and then 10' ft at recent session.  30 sec to 1 min at a time.  Goal status: ongoing        ASSESSMENT:   CLINICAL IMPRESSION: Patients back and knee pain limits session greatly. Requires multi seated rest breaks. Patient walks longer distances today as compared to last several sessions. Patient consistently feels that her knees are going to give out and demands to sit at that time. Patient with poor control of knee hyperextension on leg press despite being given manual and verbal cues throughout. Patient requested to end session early as she was too tired to continue. Patient could not be encouraged otherwise. Patient will continue to benefit from physical therapy in order to improve function and reduce impairment.    OBJECTIVE IMPAIRMENTS Abnormal gait, decreased activity tolerance, decreased balance, decreased endurance, decreased knowledge of use of DME, decreased mobility, difficulty walking, decreased ROM, decreased strength,  impaired flexibility, impaired UE functional use, improper body mechanics, and obesity.    ACTIVITY LIMITATIONS cleaning, community activity, driving, meal prep, occupation, laundry, yard work, shopping, and yard work.    PERSONAL FACTORS Behavior pattern, Fitness, Past/current experiences, Time since onset of injury/illness/exacerbation, and 3+ comorbidities: CVA, increased BMI, sedentary   are also affecting patient's functional outcome.      REHAB POTENTIAL: Good   CLINICAL DECISION MAKING: Evolving/moderate complexity   EVALUATION COMPLEXITY: Moderate   PLAN: PT FREQUENCY: 2x/week   PT DURATION: 6 weeks   PLANNED INTERVENTIONS: Therapeutic exercises, Therapeutic activity, Neuromuscular re-education, Balance training, Gait training, Patient/Family education, Joint manipulation, Joint mobilization, Stair training, Orthotic/Fit training, DME instructions, Aquatic Therapy, Dry Needling, Electrical stimulation, Spinal manipulation, Spinal mobilization, Cryotherapy, Moist heat, Compression bandaging, scar mobilization, Splintting, Taping, Traction, Ultrasound, Ionotophoresis 4mg /ml Dexamethasone, and Manual therapy    PLAN FOR NEXT SESSION: continue to work on strengthening, bed mobility, gait and transfer training, standing balance and increasing overall functional mobility and decreasing fall risk.    4:11 PM, 09/15/21 Denasia Venn PT, DPT

## 2021-09-18 ENCOUNTER — Ambulatory Visit (HOSPITAL_COMMUNITY): Payer: 59 | Attending: Family Medicine | Admitting: Occupational Therapy

## 2021-09-18 ENCOUNTER — Ambulatory Visit (HOSPITAL_COMMUNITY): Payer: 59

## 2021-09-18 ENCOUNTER — Encounter (HOSPITAL_COMMUNITY): Payer: Self-pay | Admitting: Occupational Therapy

## 2021-09-18 DIAGNOSIS — R29818 Other symptoms and signs involving the nervous system: Secondary | ICD-10-CM | POA: Diagnosis not present

## 2021-09-18 DIAGNOSIS — R278 Other lack of coordination: Secondary | ICD-10-CM

## 2021-09-18 DIAGNOSIS — R2689 Other abnormalities of gait and mobility: Secondary | ICD-10-CM | POA: Insufficient documentation

## 2021-09-18 DIAGNOSIS — R279 Unspecified lack of coordination: Secondary | ICD-10-CM | POA: Diagnosis not present

## 2021-09-18 DIAGNOSIS — R29898 Other symptoms and signs involving the musculoskeletal system: Secondary | ICD-10-CM | POA: Diagnosis not present

## 2021-09-18 DIAGNOSIS — Z8673 Personal history of transient ischemic attack (TIA), and cerebral infarction without residual deficits: Secondary | ICD-10-CM | POA: Diagnosis not present

## 2021-09-18 DIAGNOSIS — M6281 Muscle weakness (generalized): Secondary | ICD-10-CM | POA: Insufficient documentation

## 2021-09-18 NOTE — Therapy (Signed)
OUTPATIENT OCCUPATIONAL THERAPY TREATMENT NOTE   Patient Name: Connie CapronDenise Steffler MRN: [id number] DOB:October 16, 1965, 56 y.o., female Today's Date: 09/18/2021  PCP: Toma DeitersHasanaj, Xaje A, MD REFERRING PROVIDER: Delano Metzorrington, Kip, MD   Progress Note Reporting Period 08/08/2021 to 09/18/2021  See note below for Objective Data and Assessment of Progress/Goals.      END OF SESSION:   OT End of Session - 09/18/21 1534     Visit Number 8    Number of Visits 24    Date for OT Re-Evaluation 10/31/21    Authorization Type 1) [employer] 2) [place] [employer]    Authorization Time Period 1) no copay 60 visit limit  2) No auth required for patients over 21 at this time. Adults - 27 visits per calendar year OT/PT/SLP    Authorization - Visit Number 8    Authorization - Number of Visits 60    OT Start Time 1434    OT Stop Time 1520    OT Time Calculation (min) 46 min    Activity Tolerance Patient tolerated treatment well    Behavior During Therapy WFL for tasks assessed/performed                  Past Medical History:  Diagnosis Date   Acute heart failure (HCC)    ADD (attention deficit disorder)    Bronchitis    Depression    Diabetes mellitus without complication (HCC)    Elevated troponin    Hypertension    Obesity    Past Surgical History:  Procedure Laterality Date   CESAREAN SECTION     Patient Active Problem List   Diagnosis Date Noted   Chronic combined systolic and diastolic congestive heart failure (HCC) 08/25/2020   Acute CVA (cerebrovascular accident) (HCC) 08/24/2020   Borderline prolonged QT interval 08/24/2020   Type 2 diabetes mellitus with complication, without long-term current use of insulin (HCC) 03/16/2020   Anasarca    Diabetes mellitus (HCC)    Hypokalemia    Vitamin D deficiency    Hypothyroidism    Physical deconditioning    Acute on chronic systolic CHF (congestive heart failure) (HCC) 03/09/2020   Acute on chronic systolic HF (heart failure) (HCC)  03/09/2020   Hyperglycemia 12/22/2018   Hypertensive crisis 04/04/2018   Acute CHF (congestive heart failure) (HCC) 04/04/2018   Mild renal insufficiency 04/04/2018   Elevated troponin 04/04/2018   Bronchitis    Obesity    Seborrheic dermatitis of scalp 05/23/2016   Essential hypertension 12/02/2015   Depression 12/02/2015   Primary insomnia 12/02/2015   Morbid obesity with body mass index of 50.0-59.9 in adult [facility](HCC) 12/02/2015   Attention deficit hyperactivity disorder (ADHD), predominantly inattentive type 12/02/2015    ONSET DATE: 08/23/20  REFERRING DIAG: Left CVA with right side weakness  THERAPY DIAG:  Other lack of coordination  Other symptoms and signs involving the nervous system  Rationale for Evaluation and Treatment Rehabilitation   PERTINENT HISTORY: Patient sustained an acute lacunar infarct on the left side causing right side weakness. Patient [facility], and HH OT and PT for 1 month. Does not know the date when she sees Dr. Salvadore Farberorrington for a follow-up. She is currently using a borrowed manual wheelchair from SNF [facility]([place]and she is unaware when she needs to return it.   PRECAUTIONS: Fall and Other: right side hemiparesis  SUBJECTIVE: S: I didn't do my exercises. I don't know why.  PAIN:  Are you having pain? No     OBJECTIVE:  UE ROM      Active ROM - seated. IR/er adducted Right 08/08/2021 Right 09/18/2021  Shoulder flexion 0 0  Shoulder abduction 50 50  Shoulder internal rotation 60 60  Shoulder external rotation 0 - UE positioning assist 20 -UE positioning assist  Elbow flexion 136 140  Elbow extension -21 -4  Wrist flexion  - gravity eliminated 66 66  Wrist extension - gravity eliminated 0 0  Wrist ulnar deviation unable unable  Wrist radial deviation unable unable  Wrist pronation unable unable  Wrist supination 0 unable  (Blank rows = not tested)     UE MMT:      MMT -  seated. IR/er adducted Right 08/08/2021 Right  09/18/2021  Shoulder flexion 3-/5 3-/5  Shoulder abduction 3-/5 3-/5  Shoulder internal rotation 3/5 3/5  Shoulder external rotation 3-/5 3-/5  Elbow flexion 3/5 3/5  Elbow extension 3-/5 3/5  Wrist flexion 3/5 4-/5  Wrist extension 3-/5 3-/5  Wrist ulnar deviation 0/5 0/5  Wrist radial deviation 0/5 0/5  Wrist pronation 3-/5 3-/5  Wrist supination 3-/5 3-/5  (Blank rows = not tested)   HAND FUNCTION: Grip strength: Right: 6 lbs (3 lbs previous); Left: 58 lbs and Lateral pinch: Right: 5 lbs (4 lbs previous), Left: 12 lbs   COORDINATION: Unable to test 9 hole peg test  GOALS:     SHORT TERM GOALS: Target date: 09/19/2021   Patient/caregiver will be educated on HEP in order to facilitate her progress in therapy and increase her functional performance when completing ADL tasks and move towards more functional use of her RUE.  Baseline: Goal status: on-going   2.  Patient will complete bathing and dressing tasks at moderate assist level while seated and using a hemi dressing technique.  Baseline:  Goal status: on-going   3.  Patient will increase her RUE A/ROM by 10 degrees where able in order to increase her ability to assist and complete basic ADL tasks.  Baseline:  Goal status:on-going   4.  Patient will demonstrate an increase her right hand coordination in order to complete the 9 hole peg test with assist.  Baseline:  Goal status:on-going   5.  Patient will increase her hand hand grip and pinch strength by 2-3lbs in order to progress towards using her right hand to stabilize items when eating, grooming, etc. Baseline:  Goal status: on-going     LONG TERM GOALS: Target date: 10/31/2021   Patient will complete bathing,dressing, and grooming tasks at min assist level while seated and using a hemi dressing technique.  Baseline:  Goal status:on-going   2.  Patient will be able to demonstrate a tub/shower transfer at min  assist using a tub bench in order to return to taking shower with caregiver assisting. Baseline:  Goal status: on-going   3.  Patient will increase her RUE A/ROM in order to use her right arm/hand as an active assist when completing dressing, bathing, and grooming tasks.  Baseline:  Goal status: on-going   4.  Patient will demonstrate an  increase her right hand coordination by completing the 9 hole peg test without assistance. Baseline:  Goal status: on-going   5.  Patient will increase her right hand grip and pinch strength to 10% of average norms for age in order to progress her ability to use her hand as an active assist.  Baseline:  Goal status: on-going   PATIENT  EDUCATION: Education details: discussed HEP completion Person educated: patient, mother Education method: explanation Education comprehension: verbalized understanding     HOME EXERCISE PROGRAM: 5/18: AA/ROM - shoulder supine and seated (table), elbow AA/ROM at table.  TODAY'S TREATMENT:  09/18/21 - AA/ROM- R hand secured to PVC pipe with green theraband; shoulder protraction, overhead press with OT assisting for full elbow extension, 10 repetitions, rest breaks as needed  -Weightbearing: standing with OT assist for elbow extension and hand positioning, weightbearing onto RUE for 1 minute, 2x -NMES: targeting wrist extensors, [place], 34 mA CC, 10'     09/15/21 - table slides with RUE on towel to target shoulder flexion, diagonal to the left and right 10x each direction  - AA/ROM- R hand secured to PVC pipe with green theraband; shoulder protraction, overhead press (min assist from OT to support RUE) 2x 10 repetitions, rest breaks as needed (one set completed prior to weight bearing exercise and once post weight bearing exercise)  - Weight bearing exercise: in standing at edge of table, bearing weight through R UE for approx. 2 mins (OT providing support to RUE) reaching with LUE to retrieve clips and cross midline to  place on clip tree.  - NMES: targeting wrist extensors, [place], 30 mA CC, 10'    09/13/21 - table slides with RUE on towel to target shoulder flexion, diagonal to the left 10x each direction  - AA/ROM- R hand secured to PVC pipe with green theraband; shoulder protraction, shoulder flexion (min assist from OT to support RUE)  2 sets of 10 repetitions, rest breaks as needed -NMES: targeting wrist extensors, [place], 34 mA CC, 10'     CLINICAL IMPRESSION: P: Pt reports she has not been completing HEP exercises this week.  Mini-reassessment completed this session, pt is making slow progress however does have some improvement in ROM and strength in RUE. Extensive education on the importance of completing HEP daily, as well as standing up or moving around multiple times throughout the day. Gave patient a chart to begin standing up for at least 1 minute every hour, as well as reminders for HEP. Continued with weightbearing today, RUE AA/ROM, and NMES. Less successful with NMES today due to tone. Verbal cuing and tactile assist throughout exercises.   PLAN: OT FREQUENCY: 2x/week   OT DURATION: 12 weeks   PLANNED INTERVENTIONS: self care/ADL training, therapeutic exercise, therapeutic activity, neuromuscular re-education, manual therapy, passive range of motion, balance training, splinting, electrical stimulation, ultrasound, moist heat, cryotherapy, patient/family education, visual/perceptual remediation/compensation, psychosocial skills training, energy conservation, coping strategies training, and DME and/or AE instructions   RECOMMENDED OTHER SERVICES: Manual wheelchair evaluation from mobility specialist   CONSULTED AND AGREED WITH PLAN OF CARE: Patient and family member/caregiver   PLAN FOR NEXT SESSION: P: Continue with NMES, weightbearing, follow up on HEP and standing     Ezra Sites, OTR/L  [phone] 09/18/2021, 3:35 PM

## 2021-09-20 ENCOUNTER — Ambulatory Visit (HOSPITAL_COMMUNITY): Payer: 59 | Attending: Family Medicine | Admitting: Occupational Therapy

## 2021-09-20 ENCOUNTER — Ambulatory Visit (HOSPITAL_COMMUNITY): Payer: 59

## 2021-09-20 ENCOUNTER — Encounter (HOSPITAL_COMMUNITY): Payer: Self-pay | Admitting: Occupational Therapy

## 2021-09-20 DIAGNOSIS — R278 Other lack of coordination: Secondary | ICD-10-CM | POA: Insufficient documentation

## 2021-09-20 DIAGNOSIS — R29818 Other symptoms and signs involving the nervous system: Secondary | ICD-10-CM | POA: Insufficient documentation

## 2021-09-20 NOTE — Therapy (Signed)
OUTPATIENT OCCUPATIONAL THERAPY TREATMENT NOTE   Patient Name: Alexyz Gut MRN: [id number] DOB:01/02/1966, 56 y.o., female Today's Date: 09/20/2021  PCP: Neale Burly, MD REFERRING PROVIDER: Ledora Bottcher, MD    END OF SESSION:   OT End of Session - 09/20/21 1424     Visit Number 9    Number of Visits 24    Date for OT Re-Evaluation 10/31/21    Authorization Type 1) [employer] 2) [place] medicaid U[employer]    Authorization Time Period 1) no copay 60 visit limit  2) No auth required for patients over 21 at this time. Adults - 27 visits per calendar year OT/PT/SLP    Authorization - Visit Number 9    Authorization - Number of Visits 53    OT Start Time 1350    OT Stop Time 1430    OT Time Calculation (min) 40 min    Activity Tolerance Patient tolerated treatment well    Behavior During Therapy WFL for tasks assessed/performed                   Past Medical History:  Diagnosis Date   Acute heart failure ([place])    ADD (attention deficit disorder)    Bronchitis    Depression    Diabetes mellitus without complication ([place])    Elevated troponin    Hypertension    Obesity    Past Surgical History:  Procedure Laterality Date   CESAREAN SECTION     Patient Active Problem List   Diagnosis Date Noted   Chronic combined systolic and diastolic congestive heart failure ([place]) 08/25/2020   Acute CVA (cerebrovascular accident) ([place]) 08/24/2020   Borderline prolonged QT interval 08/24/2020   Type 2 diabetes mellitus with complication, without long-term current use of insulin ([place]) 03/16/2020   Anasarca    Diabetes mellitus ([place])    Hypokalemia    Vitamin D deficiency    Hypothyroidism    Physical deconditioning    Acute on chronic systolic CHF (congestive heart failure) ([place]) 03/09/2020   Acute on chronic systolic HF (heart failure) ([place]) 03/09/2020   Hyperglycemia 12/22/2018   Hypertensive crisis 04/04/2018   Acute CHF (congestive heart failure) ([place]) 04/04/2018    Mild renal insufficiency 04/04/2018   Elevated troponin 04/04/2018   Bronchitis    Obesity    Seborrheic dermatitis of scalp 05/23/2016   Essential hypertension 12/02/2015   Depression 12/02/2015   Primary insomnia 12/02/2015   Morbid obesity with body mass index of 50.0-59.9 in adult [facility]) 12/02/2015   Attention deficit hyperactivity disorder (ADHD), predominantly inattentive type 12/02/2015    ONSET DATE: 08/23/20  REFERRING DIAG: Left CVA with right side weakness  THERAPY DIAG:  Other lack of coordination  Other symptoms and signs involving the nervous system  Rationale for Evaluation and Treatment Rehabilitation   PERTINENT HISTORY: Patient sustained an acute lacunar infarct on the left side causing right side weakness. Patient reports that she received therapy services at [facility] for 2.5 weeks, and HH OT and PT for 1 month. Does not know the date when she sees Dr. Neta Mends for a follow-up. She is currently using a borrowed manual wheelchair from SNF ([place] she is unaware when she needs to return it.   PRECAUTIONS: Fall and Other: right side hemiparesis  SUBJECTIVE: S: I didn't do my exercises because I was nauseas.   PAIN:  Are you having pain? No     OBJECTIVE:  UE ROM      Active  ROM - seated. IR/er adducted Right 08/08/2021 Right 09/18/2021  Shoulder flexion 0 0  Shoulder abduction 50 50  Shoulder internal rotation 60 60  Shoulder external rotation 0 - UE positioning assist 20 -UE positioning assist  Elbow flexion 136 140  Elbow extension -21 -4  Wrist flexion  - gravity eliminated 66 66  Wrist extension - gravity eliminated 0 0  Wrist ulnar deviation unable unable  Wrist radial deviation unable unable  Wrist pronation unable unable  Wrist supination 0 unable  (Blank rows = not tested)     UE MMT:      MMT - seated. IR/er adducted Right 08/08/2021 Right  09/18/2021  Shoulder flexion 3-/5 3-/5  Shoulder abduction 3-/5 3-/5   Shoulder internal rotation 3/5 3/5  Shoulder external rotation 3-/5 3-/5  Elbow flexion 3/5 3/5  Elbow extension 3-/5 3/5  Wrist flexion 3/5 4-/5  Wrist extension 3-/5 3-/5  Wrist ulnar deviation 0/5 0/5  Wrist radial deviation 0/5 0/5  Wrist pronation 3-/5 3-/5  Wrist supination 3-/5 3-/5  (Blank rows = not tested)   HAND FUNCTION: Grip strength: Right: 6 lbs (3 lbs previous); Left: 58 lbs and Lateral pinch: Right: 5 lbs (4 lbs previous), Left: 12 lbs   COORDINATION: Unable to test 9 hole peg test  GOALS:     SHORT TERM GOALS: Target date: 09/19/2021   Patient/caregiver will be educated on HEP in order to facilitate her progress in therapy and increase her functional performance when completing ADL tasks and move towards more functional use of her RUE.  Baseline: Goal status: on-going   2.  Patient will complete bathing and dressing tasks at moderate assist level while seated and using a hemi dressing technique.  Baseline:  Goal status: on-going   3.  Patient will increase her RUE A/ROM by 10 degrees where able in order to increase her ability to assist and complete basic ADL tasks.  Baseline:  Goal status:on-going   4.  Patient will demonstrate an increase her right hand coordination in order to complete the 9 hole peg test with assist.  Baseline:  Goal status:on-going   5.  Patient will increase her hand hand grip and pinch strength by 2-3lbs in order to progress towards using her right hand to stabilize items when eating, grooming, etc. Baseline:  Goal status: on-going     LONG TERM GOALS: Target date: 10/31/2021   Patient will complete bathing,dressing, and grooming tasks at min assist level while seated and using a hemi dressing technique.  Baseline:  Goal status:on-going   2.  Patient will be able to demonstrate a tub/shower transfer at min assist using a tub bench in order to return to taking shower with caregiver assisting. Baseline:  Goal status:  on-going   3.  Patient will increase her RUE A/ROM in order to use her right arm/hand as an active assist when completing dressing, bathing, and grooming tasks.  Baseline:  Goal status: on-going   4.  Patient will demonstrate an  increase her right hand coordination by completing the 9 hole peg test without assistance. Baseline:  Goal status: on-going   5.  Patient will increase her right hand grip and pinch strength to 10% of average norms for age in order to progress her ability to use her hand as an active assist.  Baseline:  Goal status: on-going   PATIENT EDUCATION: Education details: discussed HEP completion Person educated: patient, mother Education method: explanation Education comprehension: verbalized understanding  HOME EXERCISE PROGRAM: 5/18: AA/ROM - shoulder supine and seated (table), elbow AA/ROM at table.  TODAY'S TREATMENT:  09/20/21 -Weightbearing: standing with OT assist for elbow extension and hand positioning, weightbearing onto RUE for 1 minute, 2x - AA/ROM- R hand secured to PVC pipe with green theraband, half wedge behind back for positioning; shoulder protraction, flexion, 10 repetitions, rest breaks as needed  -Functional Reaching: 5 cones placed in arc around pt while seated at tabletop, pt with hand on washcloth, working to reach towards each cone and push as far away as possible without leaning forward-half wedge placed behind back as cue if leaning too far forward. Completed 2 rounds -Functional reaching-speed task: 5 cones placed in arc around pt while seated at tabletop, pt with hand on washcloth, OT calling out cone colors and pt reaching to touch cones quickly as soon as OT calling colors. Completed 2 rounds, mod difficulty with speed -NMES: targeting wrist extensors, [place], 32 mA CC, 10'    09/18/21 - AA/ROM- R hand secured to PVC pipe with green theraband; shoulder protraction, overhead press with OT assisting for full elbow extension, 10  repetitions, rest breaks as needed  -Weightbearing: standing with OT assist for elbow extension and hand positioning, weightbearing onto RUE for 1 minute, 2x -NMES: targeting wrist extensors, [place], 34 mA CC, 10'     09/15/21 - table slides with RUE on towel to target shoulder flexion, diagonal to the left and right 10x each direction  - AA/ROM- R hand secured to PVC pipe with green theraband; shoulder protraction, overhead press (min assist from OT to support RUE) 2x 10 repetitions, rest breaks as needed (one set completed prior to weight bearing exercise and once post weight bearing exercise)  - Weight bearing exercise: in standing at edge of table, bearing weight through R UE for approx. 2 mins (OT providing support to RUE) reaching with LUE to retrieve clips and cross midline to place on clip tree.  - NMES: targeting wrist extensors, [place], 30 mA CC, 10'    CLINICAL IMPRESSION: P: Pt reports she has not been completing HEP exercises due to feeling bad yesterday. Continued with weightbearing, improvement in ability to maintain standing for 1' without requesting to sit throughout task. Added functional reaching and speed reaching today, pt with mod difficulty with speed task. Pt with improvement in ability to fully extend elbow during AA/ROM, OT only providing verbal cuing. Continued with NMES, able to achieve full wrist extension today. Verbal cuing and tactile assist throughout exercises.   PLAN: OT FREQUENCY: 2x/week   OT DURATION: 12 weeks   PLANNED INTERVENTIONS: self care/ADL training, therapeutic exercise, therapeutic activity, neuromuscular re-education, manual therapy, passive range of motion, balance training, splinting, electrical stimulation, ultrasound, moist heat, cryotherapy, patient/family education, visual/perceptual remediation/compensation, psychosocial skills training, energy conservation, coping strategies training, and DME and/or AE instructions   RECOMMENDED OTHER  SERVICES: Manual wheelchair evaluation from mobility specialist   CONSULTED AND AGREED WITH PLAN OF CARE: Patient and family member/caregiver   PLAN FOR NEXT SESSION: P: Continue with NMES, weightbearing, follow up on HEP and standing, continue with functional reaching and speed tasks, standing activity      Guadelupe Sabin, OTR/L  [phone] 09/20/2021, 3:05 PM

## 2021-09-21 ENCOUNTER — Ambulatory Visit (HOSPITAL_COMMUNITY): Payer: 59

## 2021-09-27 ENCOUNTER — Encounter (HOSPITAL_COMMUNITY): Payer: Self-pay

## 2021-09-27 ENCOUNTER — Ambulatory Visit (HOSPITAL_COMMUNITY): Payer: 59 | Attending: Internal Medicine

## 2021-09-27 DIAGNOSIS — R29818 Other symptoms and signs involving the nervous system: Secondary | ICD-10-CM

## 2021-09-27 DIAGNOSIS — R29898 Other symptoms and signs involving the musculoskeletal system: Secondary | ICD-10-CM

## 2021-09-27 DIAGNOSIS — R2689 Other abnormalities of gait and mobility: Secondary | ICD-10-CM | POA: Insufficient documentation

## 2021-09-27 DIAGNOSIS — M6281 Muscle weakness (generalized): Secondary | ICD-10-CM | POA: Insufficient documentation

## 2021-09-27 NOTE — Therapy (Addendum)
OUTPATIENT PHYSICAL THERAPY TREATMENT NOTE  Progress Note [name] MRN: [id number] DOB:21-Jun-1965, 56 y.o., female Today's Date: 09/27/2021  PCP: Neale Burly, MD REFERRING PROVIDER: Curly Rim, MD Progress Note   Reporting Period  08/24/21 to 09/27/21   See note below for Objective Data and Assessment of Progress/Goals  END OF SESSION:   PT End of Session - 09/27/21 1608     Visit Number 15    Number of Visits [place] (60 VL 10 used, 65 remain)    Authorization - Visit Number 15    Authorization - Number of Visits 23    PT Start Time 3662    PT Stop Time 1600    PT Time Calculation (min) 45 min    Equipment Utilized During Treatment Gait belt    Activity Tolerance Patient tolerated treatment well;Patient limited by fatigue    Behavior During Therapy WFL for tasks assessed/performed                 Past Medical History:  Diagnosis Date   Acute heart failure ([place])    ADD (attention deficit disorder)    Bronchitis    Depression    Diabetes mellitus without complication ([place])    Elevated troponin    Hypertension    Obesity    Past Surgical History:  Procedure Laterality Date   CESAREAN SECTION     Patient Active Problem List   Diagnosis Date Noted   Chronic combined systolic and diastolic congestive heart failure ([place]) 08/25/2020   Acute CVA (cerebrovascular accident) ([place]) 08/24/2020   Borderline prolonged QT interval 08/24/2020   Type 2 diabetes mellitus with complication, without long-term current use of insulin ([place]) 03/16/2020   Anasarca    Diabetes mellitus ([place])    Hypokalemia    Vitamin D deficiency    Hypothyroidism    Physical deconditioning    Acute on chronic systolic CHF (congestive heart failure) ([place]) 03/09/2020   Acute on chronic systolic HF (heart failure) ([place]) 03/09/2020   Hyperglycemia 12/22/2018   Hypertensive crisis 04/04/2018   Acute CHF (congestive heart failure)  ([place]) 04/04/2018   Mild renal insufficiency 04/04/2018   Elevated troponin 04/04/2018   Bronchitis    Obesity    Seborrheic dermatitis of scalp 05/23/2016   Essential hypertension 12/02/2015   Depression 12/02/2015   Primary insomnia 12/02/2015   Morbid obesity with body mass index of 50.0-59.9 in adult [facility]) 12/02/2015   Attention deficit hyperactivity disorder (ADHD), predominantly inattentive type 12/02/2015    REFERRING DIAG: Z86.73 (ICD-10-CM) - Personal history of transient ischemic attack (TIA), and cerebral infarction without residual deficits     THERAPY DIAG:  Muscle weakness (generalized)  Other symptoms and signs involving the nervous system  Other symptoms and signs involving the musculoskeletal system  Other abnormalities of gait and mobility  PERTINENT HISTORY:  Essential hypertension;  Hypothyroidism due to Hashimoto's thyroiditis;  CVD (cerebrovascular disease);  History of CVA (cerebrovascular accident);  Type 2 diabetes mellitus with hyperglycemia   PRECAUTIONS: fall   SUBJECTIVE: Patient reports that she is doing better today. Received from OT trying to utilize clinics wheelchair instead of her current own chair.    PAIN:  Are you having pain? Yes,  8/10 initially  OBJECTIVE:        TODAY'S TREATMENT:  09/27/21 Gait training Rt UE platform walker: 3 sets 1'43" 39f; 2'23" 235f 57" [id number] to LE fatigue and trimmers Pt able to stand  with Lt UE pushing from chair 5x Bed mobility MMT Reviewed goals  09/15/21 Pull to stand at // bar 2x5 Ambulation using Rt UE platform walker, 10', 20', 10' walks with rest between Leg press left leg 3 plates x20, right leg 2 plates x20 with cues to control knee hyperextension  09/13/21 Pull to stand at // bar 2x10 Ambulation using Rt UE platform walker,  3', attempted second stand Nustep x5 mins, b/l LE, uni R UE Seated quad extensions at cc with 20# x20   09/08/21 Pull to stand at // bar x10-> mini squat  x3 Ambulation using Rt UE platform walker,  3', 5', 10' x2with min A Wheelchair to mat with moda Manual resisted right leg press x30 Manual resisted sidelying hip extension x30 Bridges x10 with cueing for breath control Manual resisted hooklying clamshell x30    08/31/21 Pull to stand at // bar x10  Ambulation using Rt UE platform walker,  5' with min A Attempted use of harnessing system  Sit to stand from wheelchair x10 at [facility]  08/24/2021 WC to mat transfer with mod A today; increased pain levels today require patient to need increased assistance Sit to supine min to mod Assist Moist heat on low back in supine x 10' for pain  Rolling to prone with CGA Prone lying x 5' Prone to supine Cga Supine to sit min A Mat to WC with min Assist Progress note    08/17/21  Ambulation using Rt UE platform walker, 2 bouts; 5' and 10' with min A  Seated LAQ Rt 5" holds  Supine Bridge 2X10  SLR 2X10 Rt LE  Transfers mat to/from wheelchair with min assist and lateral scooting with VC's  08/15/21 Transfer to standing with hemi walker x2 with min A with 3 feet ambulation to table min A Bridge 2x10  March 2x 10 bilateral  Seated pelvic tilts 2x 20  Seated lateral pelvic tilts 2x 20  Lateral scooting 2 x 4 feet Transfer to standing with hemiwalker x1 with min A with 2 feet ambulation with min/mod A               Italics below from Evaluation on 07/13/2021 DIAGNOSTIC FINDINGS: MRI 08/24/20 IMPRESSION: Acute lacunar type infarct involving the posterior limb of the left internal capsule. Additional small subacute lacunar type infarct involving the left periatrial white matter.             MMT Right 07/13/2021 Left 07/13/2021 Right 08/24/2021 Left 08/24/2021 Right 09/27/21 Left  09/27/21  Hip flexion 3-/5 5/5 3- 5 Supine: 3+/5 Supine:  5/5  Hip extension  2-   2+ 4- Prone: 2+ Prone 4-/5  Hip abduction  3-     Sidelying:  Flexed hip 3/5  Sidelying: Compensates with flexed hip 4+/5  Hip  adduction          Hip internal rotation          Hip external rotation          Knee flexion 2/5tested prone 5/5 2+ 5 Prone: 2+ Prone: 5/5  Knee extension 4-/5 5/_0 4/5 5/5  Ankle dorsiflexion 3/5 5/5 2- 5 2/5 5/5  Sit to stand and transfer with mod assist with pt having a posterior lean. Sit to supine with mod assist Supine to side lying to prone with min assist   TX:  sit to stand x 5;                 Sitting:  LAQ x 10 with manual resistance                 Side lying hip abduction x 5                Supine:  bridge x 10                        SLR x 10               Isometric ab/adduction x 10                        Clam x 10                       Dead bug with therapist assist of RT UE x 10                 Prone:  Knee flexion, SLR x 10@    07/13/2021  LE ROM:    WFL for tasks assessed    MMT:     MMT Right 07/13/2021 Left 07/13/2021  Hip flexion 3-/5 5/5  Hip extension      Hip abduction      Hip adduction      Hip internal rotation      Hip external rotation      Knee flexion 3+/5 5/5  Knee extension 4-/5 5/5  Ankle dorsiflexion 3/5 5/5   Ankle plantarflexion      Ankle inversion      Ankle eversion      (Blank rows = not tested)     TRANSFERS: Assistive device utilized: Hemi walker and Wheelchair (manual)  Sit to stand: Mod A Stand to sit: Mod A       GAIT: Gait pattern: ataxic Distance walked: 3 feet Assistive device utilized:  hemiwalker Level of assistance:  min/mod assist Comments: unsteady   PATIENT EDUCATION:  Education details: 08/24/2021 progress and plan of care. Patient educated on exam findings, POC, scope of PT, HEP, and increasing activity. 08/04/21 = education on nerve tension, nerve and muscle lengthening, clonus reaction and pressure to stop; and mostly focused on education of increased movement for unweighting "wiggle throughout day"  Person educated: Patient Education method: Explanation, Demonstration, and Handouts Education  comprehension: verbalized understanding, returned demonstration, verbal cues required, and tactile cues required      HOME EXERCISE PROGRAM: EValuation:Marching 2x 10                     LAQ 2x 10 5 Second                    Heel/toe raises 2 x 10 5 second hold              07/21/2021  Side lying hip abduction x 5                Supine:  bridge x 10                                SLR x 10        4/11: sidelying forearm push up exercises      GOALS: Goals reviewed with patient? Yes   SHORT TERM GOALS: Target date: 10/14/2021   Patient will be independent with HEP in order to improve functional outcomes. Baseline:  09/27/21:  Reports she has been pulling up and standing every hour, stands for one minute.  Pt unsure with current HEP program. Goal status: ongoing    2.  Patient will report at least 25% improvement in symptoms for improved quality of life. Baseline:  09/27/21: Reports improvements by 35%, improvements with mobility, gait, and strength. Goal status:met     LONG TERM GOALS: Target date: 10/27/2021   Patient will report at least 75% improvement in symptoms for improved quality of life. Baseline:  09/27/21: Reports improvements by 35%, improvements with mobility, gait, and strength. Goal status: ongoing    2.  Patient will be able to perform sit to stand without UE use in order to demonstrate improved functional strength. Baseline: 09/27/21: Able to stand with Lt UE pushing from chair with CGA, increased fatigue requires min A  08/24/21 needs min to mod A today due to increased back pain Goal status: ongoing    3.  Patient will be able to stand/walk for 5 minutes with LRAD use for balance in order to complete household tasks. Baseline: 6/14: Gait training Rt UE platform walker: 3 sets 1'43" 53f; 2'23" 269f 57" 17f31f/11/23 walking 5' and then 10' ft at recent session.  30 sec to 1 min at a time.  Goal status: ongoing        ASSESSMENT:   CLINICAL IMPRESSION: Pt out of  cert, reviewed goals with the following findings:  Reports she has been pulling up and standing every hour, stands for one minute.  Pt admits to non-compliant with current HEP program though able to recall exercises when demonstration/discussed.  Pt presents with improvements with standing ability and ability to ambulate further distance with Rt UE platform walker.  Pt continues to c/o her knee giving out and requests to sit, reports increased trimmers with Rt LE today with noted foot movement throughout session.  MMT complete with continues weakness Rt LE.  Pt reports she feels improvements by 35% since beginning therapy.     OBJECTIVE IMPAIRMENTS Abnormal gait, decreased activity tolerance, decreased balance, decreased endurance, decreased knowledge of use of DME, decreased mobility, difficulty walking, decreased ROM, decreased strength, impaired flexibility, impaired UE functional use, improper body mechanics, and obesity.    ACTIVITY LIMITATIONS cleaning, community activity, driving, meal prep, occupation, laundry, yard work, shopping, and yard work.    PERSONAL FACTORS Behavior pattern, Fitness, Past/current experiences, Time since onset of injury/illness/exacerbation, and 3+ comorbidities: CVA, increased BMI, sedentary   are also affecting patient's functional outcome.      REHAB POTENTIAL: Good   CLINICAL DECISION MAKING: Evolving/moderate complexity   EVALUATION COMPLEXITY: Moderate   PLAN: PT FREQUENCY: 2x/week   PT DURATION: 4 weeks   PLANNED INTERVENTIONS: Therapeutic exercises, Therapeutic activity, Neuromuscular re-education, Balance training, Gait training, Patient/Family education, Joint manipulation, Joint mobilization, Stair training, Orthotic/Fit training, DME instructions, Aquatic Therapy, Dry Needling, Electrical stimulation, Spinal manipulation, Spinal mobilization, Cryotherapy, Moist heat, Compression bandaging, scar mobilization, Splintting, Taping, Traction, Ultrasound,  Ionotophoresis 4mg31m Dexamethasone, and Manual therapy    PLAN FOR NEXT SESSION: continue to work on strengthening, bed mobility, gait and transfer training, standing balance and increasing overall functional mobility and decreasing fall risk.   CaseIhor AustinTA/CLT; CBIS [phone] Benderent PT, DPT   4:14 PM, 09/27/21

## 2021-09-29 ENCOUNTER — Ambulatory Visit (HOSPITAL_COMMUNITY): Payer: 59 | Admitting: Physical Therapy

## 2021-09-29 ENCOUNTER — Ambulatory Visit (HOSPITAL_COMMUNITY): Payer: 59 | Attending: Family Medicine | Admitting: Occupational Therapy

## 2021-09-29 ENCOUNTER — Encounter (HOSPITAL_COMMUNITY): Payer: Self-pay | Admitting: Occupational Therapy

## 2021-09-29 DIAGNOSIS — R278 Other lack of coordination: Secondary | ICD-10-CM | POA: Diagnosis present

## 2021-09-29 DIAGNOSIS — R29898 Other symptoms and signs involving the musculoskeletal system: Secondary | ICD-10-CM | POA: Insufficient documentation

## 2021-09-29 NOTE — Therapy (Signed)
OUTPATIENT OCCUPATIONAL THERAPY TREATMENT NOTE   Patient Name: Arine Foley MRN: [id number] DOB:[birth year], 56 y.o., female Today's Date: 09/29/2021  PCP: Toma Deiters, MD REFERRING PROVIDER: Delano Metz, MD    END OF SESSION:   OT End of Session - 09/29/21 1442     Visit Number 10    Number of Visits 24    Date for OT Re-Evaluation 10/31/21    Authorization Type 1) [employer] 2) [place] medicaid [employer]    Authorization Time Period 1) no copay 60 visit limit  2) No auth required for patients over 21 at this time. Adults - 27 visits per calendar year OT/PT/SLP    Authorization - Visit Number 10    Authorization - Number of Visits 60    OT Start Time 1300    OT Stop Time 1350    OT Time Calculation (min) 50 min    Activity Tolerance Patient tolerated treatment well    Behavior During Therapy WFL for tasks assessed/performed                    Past Medical History:  Diagnosis Date   Acute heart failure ([facility])    ADD (attention deficit disorder)    Bronchitis    Depression    Diabetes mellitus without complication ([facility])    Elevated troponin    Hypertension    Obesity    Past Surgical History:  Procedure Laterality Date   CESAREAN SECTION     Patient Active Problem List   Diagnosis Date Noted   Chronic combined systolic and diastolic congestive heart failure ([facility]) 08/25/2020   Acute CVA (cerebrovascular accident) ([facility]) 08/24/2020   Borderline prolonged QT interval 08/24/2020   Type 2 diabetes mellitus with complication, without long-term current use of insulin ([facility]) 03/16/2020   Anasarca    Diabetes mellitus ([facility])    Hypokalemia    Vitamin D deficiency    Hypothyroidism    Physical deconditioning    Acute on chronic systolic CHF (congestive heart failure) ([facility]) 03/09/2020   Acute on chronic systolic HF (heart failure) ([facility]) 03/09/2020   Hyperglycemia 12/22/2018   Hypertensive crisis 04/04/2018   Acute CHF (congestive heart failure) ([facility])  04/04/2018   Mild renal insufficiency 04/04/2018   Elevated troponin 04/04/2018   Bronchitis    Obesity    Seborrheic dermatitis of scalp 05/23/2016   Essential hypertension 12/02/2015   Depression 12/02/2015   Primary insomnia 12/02/2015   Morbid obesity with body mass index of 50.0-59.9 in adult [facility]) 12/02/2015   Attention deficit hyperactivity disorder (ADHD), predominantly inattentive type 12/02/2015    ONSET DATE: 08/23/20  REFERRING DIAG: Left CVA with right side weakness  THERAPY DIAG:  Other symptoms and signs involving the musculoskeletal system  Other lack of coordination  Rationale for Evaluation and Treatment Rehabilitation   PERTINENT HISTORY: Patient sustained an acute lacunar infarct on the left side causing right side weakness. Patient reports that she received therapy services at [facility] for 2.5 weeks, and HH OT and PT for 1 month. Does not know the date when she sees Dr. Salvadore Farber for a follow-up. She is currently using a borrowed manual wheelchair from [facility], [place] she is unaware when she needs to return it.   PRECAUTIONS: Fall and Other: right side hemiparesis  SUBJECTIVE: S: I have been doing my part  PAIN:  Are you having pain? No     OBJECTIVE:  UE ROM      Active ROM - seated.  IR/er adducted Right 08/08/2021 Right 09/18/2021  Shoulder flexion 0 0  Shoulder abduction 50 50  Shoulder internal rotation 60 60  Shoulder external rotation 0 - UE positioning assist 20 -UE positioning assist  Elbow flexion 136 140  Elbow extension -21 -4  Wrist flexion  - gravity eliminated 66 66  Wrist extension - gravity eliminated 0 0  Wrist ulnar deviation unable unable  Wrist radial deviation unable unable  Wrist pronation unable unable  Wrist supination 0 unable  (Blank rows = not tested)     UE MMT:      MMT - seated. IR/er adducted Right 08/08/2021 Right  09/18/2021  Shoulder flexion 3-/5 3-/5  Shoulder abduction 3-/5 3-/5  Shoulder  internal rotation 3/5 3/5  Shoulder external rotation 3-/5 3-/5  Elbow flexion 3/5 3/5  Elbow extension 3-/5 3/5  Wrist flexion 3/5 4-/5  Wrist extension 3-/5 3-/5  Wrist ulnar deviation 0/5 0/5  Wrist radial deviation 0/5 0/5  Wrist pronation 3-/5 3-/5  Wrist supination 3-/5 3-/5  (Blank rows = not tested)   HAND FUNCTION: Grip strength: Right: 6 lbs (3 lbs previous); Left: 58 lbs and Lateral pinch: Right: 5 lbs (4 lbs previous), Left: 12 lbs   COORDINATION: Unable to test 9 hole peg test  GOALS:     SHORT TERM GOALS: Target date: 09/19/2021   Patient/caregiver will be educated on HEP in order to facilitate her progress in therapy and increase her functional performance when completing ADL tasks and move towards more functional use of her RUE.  Baseline: Goal status: on-going   2.  Patient will complete bathing and dressing tasks at moderate assist level while seated and using a hemi dressing technique.  Baseline:  Goal status: on-going   3.  Patient will increase her RUE A/ROM by 10 degrees where able in order to increase her ability to assist and complete basic ADL tasks.  Baseline:  Goal status:on-going   4.  Patient will demonstrate an increase her right hand coordination in order to complete the 9 hole peg test with assist.  Baseline:  Goal status:on-going   5.  Patient will increase her hand hand grip and pinch strength by 2-3lbs in order to progress towards using her right hand to stabilize items when eating, grooming, etc. Baseline:  Goal status: on-going     LONG TERM GOALS: Target date: 10/31/2021   Patient will complete bathing,dressing, and grooming tasks at min assist level while seated and using a hemi dressing technique.  Baseline:  Goal status:on-going   2.  Patient will be able to demonstrate a tub/shower transfer at min assist using a tub bench in order to return to taking shower with caregiver assisting. Baseline:  Goal status: on-going   3.   Patient will increase her RUE A/ROM in order to use her right arm/hand as an active assist when completing dressing, bathing, and grooming tasks.  Baseline:  Goal status: on-going   4.  Patient will demonstrate an  increase her right hand coordination by completing the 9 hole peg test without assistance. Baseline:  Goal status: on-going   5.  Patient will increase her right hand grip and pinch strength to 10% of average norms for age in order to progress her ability to use her hand as an active assist.  Baseline:  Goal status: on-going   PATIENT EDUCATION: Education details: discussed HEP completion Person educated: patient, mother Education method: explanation Education comprehension: verbalized understanding     HOME EXERCISE PROGRAM:  5/18: AA/ROM - shoulder supine and seated (table), elbow AA/ROM at table.  TODAY'S TREATMENT:  09/29/21  - Towel slides: seated at table with hand on towel, shoulder flexion, horizontal abduction 10x each  - Weight bearing: standing with OT providing min assist for elbow extension and hand positioning, weightbearing onto RUE for 1 minute, 2x - Functional reaching: sitting at table with hand on towel, reaching in all direction to knock over jenga blocks- 9 blocks 2x- noted increased difficulty and energy exertion with task   - PROM: wrist and fingers to decrease edema, held wrist flexion 10 seconds 2x, held full fist 10 seconds 2x - AA/ROM: R hand secured to PVC pipe with green theraband, half wedge behind back for positioning; shoulder protraction, flexion, 10 repetitions, rest breaks as needed  - Issued size small edema glove and gave education on donning/doffing and wearing schedule  -NMES: targeting wrist extensors, [place], 36 mA CC, 10'- OT providing mod assist to achieve full wrist extension    09/20/21 -Weightbearing: standing with OT assist for elbow extension and hand positioning, weightbearing onto RUE for 1 minute, 2x - AA/ROM- R hand  secured to PVC pipe with green theraband, half wedge behind back for positioning; shoulder protraction, flexion, 10 repetitions, rest breaks as needed  -Functional Reaching: 5 cones placed in arc around pt while seated at tabletop, pt with hand on washcloth, working to reach towards each cone and push as far away as possible without leaning forward-half wedge placed behind back as cue if leaning too far forward. Completed 2 rounds -Functional reaching-speed task: 5 cones placed in arc around pt while seated at tabletop, pt with hand on washcloth, OT calling out cone colors and pt reaching to touch cones quickly as soon as OT calling colors. Completed 2 rounds, mod difficulty with speed -NMES: targeting wrist extensors, [place], 32 mA CC, 10'    09/18/21 - AA/ROM- R hand secured to PVC pipe with green theraband; shoulder protraction, overhead press with OT assisting for full elbow extension, 10 repetitions, rest breaks as needed  -Weightbearing: standing with OT assist for elbow extension and hand positioning, weightbearing onto RUE for 1 minute, 2x -NMES: targeting wrist extensors, [place], 34 mA CC, 10'     09/15/21 - table slides with RUE on towel to target shoulder flexion, diagonal to the left and right 10x each direction  - AA/ROM- R hand secured to PVC pipe with green theraband; shoulder protraction, overhead press (min assist from OT to support RUE) 2x 10 repetitions, rest breaks as needed (one set completed prior to weight bearing exercise and once post weight bearing exercise)  - Weight bearing exercise: in standing at edge of table, bearing weight through R UE for approx. 2 mins (OT providing support to RUE) reaching with LUE to retrieve clips and cross midline to place on clip tree.  - NMES: targeting wrist extensors, [place], 30 mA CC, 10'    CLINICAL IMPRESSION: A: Pt reports that she has been experiencing cold sensation throughout RUE for the past 3 days. Noted swelling in R hand.  Issued an edema glove for R hand and educated pt. On how to don/doff glove and on wearing schedule. Completed P/ROM to wrist in efforts to decrease swelling. Continued with weight bearing, pt. reports that she feels this task is becoming easier and demonstrated increased standing endurance during activity.  Completed functional reaching task to target shoulder flexion and horizontal abduction. Continued with NMES, required mod assist from OT to achieve full wrist  extension today. Verbal cuing and tactile assist throughout exercises.   PLAN: OT FREQUENCY: 2x/week   OT DURATION: 12 weeks   PLANNED INTERVENTIONS: self care/ADL training, therapeutic exercise, therapeutic activity, neuromuscular re-education, manual therapy, passive range of motion, balance training, splinting, electrical stimulation, ultrasound, moist heat, cryotherapy, patient/family education, visual/perceptual remediation/compensation, psychosocial skills training, energy conservation, coping strategies training, and DME and/or AE instructions   RECOMMENDED OTHER SERVICES: Manual wheelchair evaluation from mobility specialist   CONSULTED AND AGREED WITH PLAN OF CARE: Patient and family member/caregiver   PLAN FOR NEXT SESSION: P: Continue with NMES, weightbearing, follow up on HEP and standing, continue with functional reaching and speed tasks, standing activity      Lurena Joiner, OTR/L  [phone] 09/29/2021, 2:44 PM

## 2021-09-29 NOTE — Addendum Note (Signed)
Addended by: Aleatha Borer on: 09/29/2021 04:07 PM   Modules accepted: Orders

## 2021-10-05 ENCOUNTER — Ambulatory Visit (HOSPITAL_COMMUNITY): Payer: 59 | Attending: Internal Medicine | Admitting: Physical Therapy

## 2021-10-05 DIAGNOSIS — Z8673 Personal history of transient ischemic attack (TIA), and cerebral infarction without residual deficits: Secondary | ICD-10-CM | POA: Insufficient documentation

## 2021-10-05 DIAGNOSIS — E1165 Type 2 diabetes mellitus with hyperglycemia: Secondary | ICD-10-CM | POA: Insufficient documentation

## 2021-10-05 DIAGNOSIS — I679 Cerebrovascular disease, unspecified: Secondary | ICD-10-CM | POA: Diagnosis not present

## 2021-10-05 DIAGNOSIS — R278 Other lack of coordination: Secondary | ICD-10-CM | POA: Insufficient documentation

## 2021-10-05 DIAGNOSIS — E038 Other specified hypothyroidism: Secondary | ICD-10-CM | POA: Insufficient documentation

## 2021-10-05 DIAGNOSIS — R29898 Other symptoms and signs involving the musculoskeletal system: Secondary | ICD-10-CM | POA: Diagnosis not present

## 2021-10-05 DIAGNOSIS — I10 Essential (primary) hypertension: Secondary | ICD-10-CM | POA: Insufficient documentation

## 2021-10-05 DIAGNOSIS — R29818 Other symptoms and signs involving the nervous system: Secondary | ICD-10-CM | POA: Insufficient documentation

## 2021-10-05 DIAGNOSIS — M6281 Muscle weakness (generalized): Secondary | ICD-10-CM

## 2021-10-05 NOTE — Therapy (Signed)
OUTPATIENT PHYSICAL THERAPY TREATMENT NOTE  [name] MRN: [id number] DOB:November 25, 1965, 56 y.o., female Today's Date: 10/05/2021  PCP: Neale Burly, MD REFERRING PROVIDER: Curly Rim, MD     See note below for Objective Data and Assessment of Progress/Goals  END OF SESSION:   PT End of Session - 10/05/21 1249     Visit Number 16    Number of Visits 23    Date for PT Re-Evaluation 10/27/21    Authorization Type [employer] (60 VL 10 used, 66 remain)    Authorization - Visit Number 50    Authorization - Number of Visits 59    Progress Note Due on Visit 23    PT Start Time 1315    PT Stop Time 1400    PT Time Calculation (min) 45 min    Equipment Utilized During Treatment Gait belt    Activity Tolerance Patient tolerated treatment well;Patient limited by fatigue    Behavior During Therapy WFL for tasks assessed/performed                 Past Medical History:  Diagnosis Date   Acute heart failure ([place])    ADD (attention deficit disorder)    Bronchitis    Depression    Diabetes mellitus without complication ([place])    Elevated troponin    Hypertension    Obesity    Past Surgical History:  Procedure Laterality Date   CESAREAN SECTION     Patient Active Problem List   Diagnosis Date Noted   Chronic combined systolic and diastolic congestive heart failure ([place]) 08/25/2020   Acute CVA (cerebrovascular accident) ([place]) 08/24/2020   Borderline prolonged QT interval 08/24/2020   Type 2 diabetes mellitus with complication, without long-term current use of insulin ([place]) 03/16/2020   Anasarca    Diabetes mellitus ([place])    Hypokalemia    Vitamin D deficiency    Hypothyroidism    Physical deconditioning    Acute on chronic systolic CHF (congestive heart failure) ([place]) 03/09/2020   Acute on chronic systolic HF (heart failure) ([place]) 03/09/2020   Hyperglycemia 12/22/2018   Hypertensive crisis 04/04/2018   Acute CHF (congestive heart  failure) ([place]) 04/04/2018   Mild renal insufficiency 04/04/2018   Elevated troponin 04/04/2018   Bronchitis    Obesity    Seborrheic dermatitis of scalp 05/23/2016   Essential hypertension 12/02/2015   Depression 12/02/2015   Primary insomnia 12/02/2015   Morbid obesity with body mass index of 50.0-59.9 in adult [facility]) 12/02/2015   Attention deficit hyperactivity disorder (ADHD), predominantly inattentive type 12/02/2015    REFERRING DIAG: Z86.73 (ICD-10-CM) - Personal history of transient ischemic attack (TIA), and cerebral infarction without residual deficits     THERAPY DIAG:  Other symptoms and signs involving the musculoskeletal system  Other lack of coordination  Muscle weakness (generalized)  Other symptoms and signs involving the nervous system  PERTINENT HISTORY:  Essential hypertension;  Hypothyroidism due to Hashimoto's thyroiditis;  CVD (cerebrovascular disease);  History of CVA (cerebrovascular accident);  Type 2 diabetes mellitus with hyperglycemia   PRECAUTIONS: fall   SUBJECTIVE: Pt states that nothing has gotten easier for her since she has been in therapy.  States that everything is numb due to her being in the wheelchair for to long.     PAIN:  Are you having pain? Yes,  7/10 initially  OBJECTIVE:        TODAY'S TREATMENT:  10/06/19 Stand:  Advancing RT leg x 3 3 reps  Lifting RT leg off the floor x 3 3 reps               Wt shifting x 5               Gt with hemi walker  1 time 20" second time 23 ft  Sit:      Sit to stand x 15             Resisted LAQ x 10             Resisted hip abduction x 10/adduction x 10               Lean back then pull forward to work abdominals x 10             Paloff one hand x 10   both sides             PNF 1 and 2 x 10 with blue theraband. (Hand to Rt knee; Hand to Rt shoulder )                            09/27/21 Gait training Rt UE platform walker: 3 sets 1'43" 19f; 2'23" 251f 57" [id number] to LE  fatigue and trimmers Pt able to stand with Lt UE pushing from chair 5x Bed mobility MMT Reviewed goals  09/15/21 Pull to stand at // bar 2x5 Ambulation using Rt UE platform walker, 10', 20', 10' walks with rest between Leg press left leg 3 plates x20, right leg 2 plates x20 with cues to control knee hyperextension  09/13/21 Pull to stand at // bar 2x10 Ambulation using Rt UE platform walker,  3', attempted second stand Nustep x5 mins, b/l LE, uni R UE Seated quad extensions at cc with 20# x20                   Italics below from Evaluation on 07/13/2021 DIAGNOSTIC FINDINGS: MRI 08/24/20 IMPRESSION: Acute lacunar type infarct involving the posterior limb of the left internal capsule. Additional small subacute lacunar type infarct involving the left periatrial white matter.             MMT Right 07/13/2021 Left 07/13/2021 Right 08/24/2021 Left 08/24/2021 Right 09/27/21 Left  09/27/21  Hip flexion 3-/5 5/5 3- 5 Supine: 3+/5 Supine:  5/5  Hip extension  2-   2+ 4- Prone: 2+ Prone 4-/5  Hip abduction  3-     Sidelying:  Flexed hip 3/5  Sidelying: Compensates with flexed hip 4+/5  Hip adduction          Hip internal rotation          Hip external rotation          Knee flexion 2/5tested prone 5/5 2+ 5 Prone: 2+ Prone: 5/5  Knee extension 4-/5 5/5 4 5  4/5 5/5  Ankle dorsiflexion 3/5 5/5 2- 5 2/5 5/5  Sit to stand and transfer with mod assist with pt having a posterior lean. Sit to supine with mod assist Supine to side lying to prone with min assist   TX:  sit to stand x 5;                 Sitting:  LAQ x 10 with manual resistance                 Side lying hip abduction x 5  Supine:  bridge x 10                        SLR x 10               Isometric ab/adduction x 10                        Clam x 10                       Dead bug with therapist assist of RT UE x 10                 Prone:  Knee flexion, SLR x 10@    07/13/2021  LE ROM:    WFL for tasks  assessed    MMT:     MMT Right 07/13/2021 Left 07/13/2021  Hip flexion 3-/5 5/5  Hip extension      Hip abduction      Hip adduction      Hip internal rotation      Hip external rotation      Knee flexion 3+/5 5/5  Knee extension 4-/5 5/5  Ankle dorsiflexion 3/5 5/5   Ankle plantarflexion      Ankle inversion      Ankle eversion      (Blank rows = not tested)     TRANSFERS: Assistive device utilized: Hemi walker and Wheelchair (manual)  Sit to stand: Mod A Stand to sit: Mod A       GAIT: Gait pattern: ataxic Distance walked: 3 feet Assistive device utilized:  hemiwalker Level of assistance:  min/mod assist Comments: unsteady   PATIENT EDUCATION:  Education details: 08/24/2021 progress and plan of care. Patient educated on exam findings, POC, scope of PT, HEP, and increasing activity. 08/04/21 = education on nerve tension, nerve and muscle lengthening, clonus reaction and pressure to stop; and mostly focused on education of increased movement for unweighting "wiggle throughout day"  Person educated: Patient Education method: Explanation, Demonstration, and Handouts Education comprehension: verbalized understanding, returned demonstration, verbal cues required, and tactile cues required      HOME EXERCISE PROGRAM: EValuation:Marching 2x 10                     LAQ 2x 10 5 Second                    Heel/toe raises 2 x 10 5 second hold              07/21/2021  Side lying hip abduction x 5                Supine:  bridge x 10                                SLR x 10        4/11: sidelying forearm push up exercises      GOALS: Goals reviewed with patient? Yes   SHORT TERM GOALS: Target date: 10/14/2021   Patient will be independent with HEP in order to improve functional outcomes. Baseline: 09/27/21:  Reports she has been pulling up and standing every hour, stands for one minute.  Pt unsure with current HEP program. Goal status: ongoing    2.  Patient will report at  least 25% improvement in symptoms for improved  quality of life. Baseline:  09/27/21: Reports improvements by 35%, improvements with mobility, gait, and strength. Goal status:met     LONG TERM GOALS: Target date: 10/27/2021   Patient will report at least 75% improvement in symptoms for improved quality of life. Baseline:  09/27/21: Reports improvements by 35%, improvements with mobility, gait, and strength. Goal status: ongoing    2.  Patient will be able to perform sit to stand without UE use in order to demonstrate improved functional strength. Baseline: 09/27/21: Able to stand with Lt UE pushing from chair with CGA, increased fatigue requires min A  08/24/21 needs min to mod A today due to increased back pain Goal status: ongoing    3.  Patient will be able to stand/walk for 5 minutes with LRAD use for balance in order to complete household tasks. Baseline: 6/14: Gait training Rt UE platform walker: 3 sets 1'43" 1f; 2'23" 245f 57" 5f90f/11/23 walking 5' and then 10' ft at recent session.  30 sec to 1 min at a time.  Goal status: ongoing        ASSESSMENT:   CLINICAL IMPRESSION: Therapist noting that sit to stand is only with supervision today which is significantly better than when she started therapy.  Began using theraband to  strengthen core mm with good results.  PT needs encouragement as she can do more than she thinks.    OBJECTIVE IMPAIRMENTS Abnormal gait, decreased activity tolerance, decreased balance, decreased endurance, decreased knowledge of use of DME, decreased mobility, difficulty walking, decreased ROM, decreased strength, impaired flexibility, impaired UE functional use, improper body mechanics, and obesity.    ACTIVITY LIMITATIONS cleaning, community activity, driving, meal prep, occupation, laundry, yard work, shopping, and yard work.    PERSONAL FACTORS Behavior pattern, Fitness, Past/current experiences, Time since onset of injury/illness/exacerbation, and 3+  comorbidities: CVA, increased BMI, sedentary   are also affecting patient's functional outcome.      REHAB POTENTIAL: Good   CLINICAL DECISION MAKING: Evolving/moderate complexity   EVALUATION COMPLEXITY: Moderate   PLAN: PT FREQUENCY: 2x/week   PT DURATION: 4 weeks   PLANNED INTERVENTIONS: Therapeutic exercises, Therapeutic activity, Neuromuscular re-education, Balance training, Gait training, Patient/Family education, Joint manipulation, Joint mobilization, Stair training, Orthotic/Fit training, DME instructions, Aquatic Therapy, Dry Needling, Electrical stimulation, Spinal manipulation, Spinal mobilization, Cryotherapy, Moist heat, Compression bandaging, scar mobilization, Splintting, Taping, Traction, Ultrasound, Ionotophoresis 4mg24m Dexamethasone, and Manual therapy    PLAN FOR NEXT SESSION: Goal of walking 30 ft.  continue to work on strengthening, bed mobility, gait and transfer training, standing balance and increasing overall functional mobility and decreasing fall risk.  CyntRayetta Humphrey [place]um[place] [phone]

## 2021-10-06 ENCOUNTER — Encounter (HOSPITAL_COMMUNITY): Payer: Self-pay | Admitting: Occupational Therapy

## 2021-10-06 ENCOUNTER — Ambulatory Visit (HOSPITAL_COMMUNITY): Payer: 59 | Admitting: Occupational Therapy

## 2021-10-06 DIAGNOSIS — R29898 Other symptoms and signs involving the musculoskeletal system: Secondary | ICD-10-CM

## 2021-10-06 DIAGNOSIS — R278 Other lack of coordination: Secondary | ICD-10-CM

## 2021-10-06 NOTE — Therapy (Signed)
OUTPATIENT OCCUPATIONAL THERAPY TREATMENT NOTE   [name] MRN: [id number] DOB:Nov 20, 1965, 56 y.o., female Today's Date: 10/06/2021  PCP: Toma Deiters, MD REFERRING PROVIDER: Delano Metz, MD    END OF SESSION:   OT End of Session - 10/06/21 1428     Visit Number 11    Number of Visits 24    Date for OT Re-Evaluation 10/31/21    Authorization Type 1) [employer] 2) [place] medicaid [employer]    Authorization Time Period 1) no copay 60 visit limit  2) No auth required for patients over 21 at this time. Adults - 27 visits per calendar year OT/PT/SLP    Authorization - Visit Number 11    Authorization - Number of Visits 60    OT Start Time 1300    OT Stop Time 1345    OT Time Calculation (min) 45 min    Activity Tolerance Patient tolerated treatment well    Behavior During Therapy WFL for tasks assessed/performed                     Past Medical History:  Diagnosis Date   Acute heart failure ([facility])    ADD (attention deficit disorder)    Bronchitis    Depression    Diabetes mellitus without complication ([facility])    Elevated troponin    Hypertension    Obesity    Past Surgical History:  Procedure Laterality Date   CESAREAN SECTION     Patient Active Problem List   Diagnosis Date Noted   Chronic combined systolic and diastolic congestive heart failure ([facility]) 08/25/2020   Acute CVA (cerebrovascular accident) ([facility]) 08/24/2020   Borderline prolonged QT interval 08/24/2020   Type 2 diabetes mellitus with complication, without long-term current use of insulin ([facility]) 03/16/2020   Anasarca    Diabetes mellitus ([facility])    Hypokalemia    Vitamin D deficiency    Hypothyroidism    Physical deconditioning    Acute on chronic systolic CHF (congestive heart failure) ([facility]) 03/09/2020   Acute on chronic systolic HF (heart failure) ([facility]) 03/09/2020   Hyperglycemia 12/22/2018   Hypertensive crisis 04/04/2018   Acute CHF (congestive heart failure) ([facility])  04/04/2018   Mild renal insufficiency 04/04/2018   Elevated troponin 04/04/2018   Bronchitis    Obesity    Seborrheic dermatitis of scalp 05/23/2016   Essential hypertension 12/02/2015   Depression 12/02/2015   Primary insomnia 12/02/2015   Morbid obesity with body mass index of 50.0-59.9 in adult [facility]) 12/02/2015   Attention deficit hyperactivity disorder (ADHD), predominantly inattentive type 12/02/2015    ONSET DATE: 08/23/20  REFERRING DIAG: Left CVA with right side weakness  THERAPY DIAG:  Other lack of coordination  Other symptoms and signs involving the musculoskeletal system  Rationale for Evaluation and Treatment Rehabilitation   PERTINENT HISTORY: Patient sustained an acute lacunar infarct on the left side causing right side weakness. Patient reports that she received therapy services at [facility] for 2.5 weeks, and [facility] OT and PT for 1 month. Does not know the date when she sees Dr. Salvadore Farber for a follow-up. She is currently using a borrowed manual wheelchair from [facility] [place] she is unaware when she needs to return it.   PRECAUTIONS: Fall and Other: right side hemiparesis  SUBJECTIVE: S: I have been pulling myself up going to the sink and standing more at home.   PAIN:  Are you having pain? No     OBJECTIVE:  UE ROM      Active ROM - seated. IR/er adducted Right 08/08/2021 Right 09/18/2021  Shoulder flexion 0 0  Shoulder abduction 50 50  Shoulder internal rotation 60 60  Shoulder external rotation 0 - UE positioning assist 20 -UE positioning assist  Elbow flexion 136 140  Elbow extension -21 -4  Wrist flexion  - gravity eliminated 66 66  Wrist extension - gravity eliminated 0 0  Wrist ulnar deviation unable unable  Wrist radial deviation unable unable  Wrist pronation unable unable  Wrist supination 0 unable  (Blank rows = not tested)     UE MMT:      MMT - seated. IR/er adducted Right 08/08/2021 Right  09/18/2021  Shoulder flexion 3-/5  3-/5  Shoulder abduction 3-/5 3-/5  Shoulder internal rotation 3/5 3/5  Shoulder external rotation 3-/5 3-/5  Elbow flexion 3/5 3/5  Elbow extension 3-/5 3/5  Wrist flexion 3/5 4-/5  Wrist extension 3-/5 3-/5  Wrist ulnar deviation 0/5 0/5  Wrist radial deviation 0/5 0/5  Wrist pronation 3-/5 3-/5  Wrist supination 3-/5 3-/5  (Blank rows = not tested)   HAND FUNCTION: Grip strength: Right: 6 lbs (3 lbs previous); Left: 58 lbs and Lateral pinch: Right: 5 lbs (4 lbs previous), Left: 12 lbs   COORDINATION: Unable to test 9 hole peg test  GOALS:     SHORT TERM GOALS: Target date: 09/19/2021   Patient/caregiver will be educated on HEP in order to facilitate her progress in therapy and increase her functional performance when completing ADL tasks and move towards more functional use of her RUE.  Baseline: Goal status: on-going   2.  Patient will complete bathing and dressing tasks at moderate assist level while seated and using a hemi dressing technique.  Baseline:  Goal status: on-going   3.  Patient will increase her RUE A/ROM by 10 degrees where able in order to increase her ability to assist and complete basic ADL tasks.  Baseline:  Goal status:on-going   4.  Patient will demonstrate an increase her right hand coordination in order to complete the 9 hole peg test with assist.  Baseline:  Goal status:on-going   5.  Patient will increase her hand hand grip and pinch strength by 2-3lbs in order to progress towards using her right hand to stabilize items when eating, grooming, etc. Baseline:  Goal status: on-going     LONG TERM GOALS: Target date: 10/31/2021   Patient will complete bathing,dressing, and grooming tasks at min assist level while seated and using a hemi dressing technique.  Baseline:  Goal status:on-going   2.  Patient will be able to demonstrate a tub/shower transfer at min assist using a tub bench in order to return to taking shower with caregiver  assisting. Baseline:  Goal status: on-going   3.  Patient will increase her RUE A/ROM in order to use her right arm/hand as an active assist when completing dressing, bathing, and grooming tasks.  Baseline:  Goal status: on-going   4.  Patient will demonstrate an  increase her right hand coordination by completing the 9 hole peg test without assistance. Baseline:  Goal status: on-going   5.  Patient will increase her right hand grip and pinch strength to 10% of average norms for age in order to progress her ability to use her hand as an active assist.  Baseline:  Goal status: on-going   PATIENT EDUCATION: Education details: discussed HEP completion Person educated: patient, mother Education method: explanation  Education comprehension: verbalized understanding     HOME EXERCISE PROGRAM: 5/18: AA/ROM - shoulder supine and seated (table), elbow AA/ROM at table.  TODAY'S TREATMENT:   10/06/21  - Towel slides: seated at table with hand on towel, shoulder flexion, horizontal abduction 10x each  - Weight bearing- standing with OT 1 min. Completed sit to stand transfer independently, CGA while standing - Weight bearing with functional reaching activity- standing at table using L hand to pick up coins and place in container on R side of body - functional reach seated at table: reaching for colored cones, OT calling out which color to reach (shoulder flexion) - A/AROM: sitting at table, hand secured to PVC pipe- completing shoulder flexion and protraction with min assist to support R UE (5 reps of protraction completed independently before requiring min assist)  - NMES: targeting wrist extensors, [place], 39 mA CC, 10'- OT providing mod assist for last 5 minutes to achieve full wrist extension     09/29/21  - Towel slides: seated at table with hand on towel, shoulder flexion, horizontal abduction 10x each  - Weight bearing: standing with OT providing min assist for elbow extension and  hand positioning, weightbearing onto RUE for 1 minute, 2x - Functional reaching: sitting at table with hand on towel, reaching in all direction to knock over jenga blocks- 9 blocks 2x- noted increased difficulty and energy exertion with task   - PROM: wrist and fingers to decrease edema, held wrist flexion 10 seconds 2x, held full fist 10 seconds 2x - AA/ROM: R hand secured to PVC pipe with green theraband, half wedge behind back for positioning; shoulder protraction, flexion, 10 repetitions, rest breaks as needed  - Issued size small edema glove and gave education on donning/doffing and wearing schedule  -NMES: targeting wrist extensors, [place], 36 mA CC, 10'- OT providing mod assist to achieve full wrist extension    09/20/21 -Weightbearing: standing with OT assist for elbow extension and hand positioning, weightbearing onto RUE for 1 minute, 2x - AA/ROM- R hand secured to PVC pipe with green theraband, half wedge behind back for positioning; shoulder protraction, flexion, 10 repetitions, rest breaks as needed  -Functional Reaching: 5 cones placed in arc around pt while seated at tabletop, pt with hand on washcloth, working to reach towards each cone and push as far away as possible without leaning forward-half wedge placed behind back as cue if leaning too far forward. Completed 2 rounds -Functional reaching-speed task: 5 cones placed in arc around pt while seated at tabletop, pt with hand on washcloth, OT calling out cone colors and pt reaching to touch cones quickly as soon as OT calling colors. Completed 2 rounds, mod difficulty with speed -NMES: targeting wrist extensors, [place], 32 mA CC, 10'       CLINICAL IMPRESSION: A: Pt reports that she has been standing more at home and pulling up on stable surfaces to stand. She stated she has been using edema compression glove at home and it has been helping with swelling and cold sensation. Began session with weight bearing throughout RUE, noted  increased independence with sit to stand transfer and less exertion required. Completed functional reach activity while in standing, placing coins into container- pt reports this task is becoming easier. Completed AA/ROM while seated at table with hand secured to PVC pipe- pt able to complete 5 reps of shoulder protraction without OT providing support, this is improvement from previous session. Continued with NMES, required mod assist from OT to achieve  full wrist extension today. Verbal cuing and tactile assist throughout exercises.   PLAN: OT FREQUENCY: 2x/week   OT DURATION: 12 weeks   PLANNED INTERVENTIONS: self care/ADL training, therapeutic exercise, therapeutic activity, neuromuscular re-education, manual therapy, passive range of motion, balance training, splinting, electrical stimulation, ultrasound, moist heat, cryotherapy, patient/family education, visual/perceptual remediation/compensation, psychosocial skills training, energy conservation, coping strategies training, and DME and/or AE instructions   RECOMMENDED OTHER SERVICES: Manual wheelchair evaluation from mobility specialist   CONSULTED AND AGREED WITH PLAN OF CARE: Patient and family member/caregiver   PLAN FOR NEXT SESSION: P: Continue with NMES, weightbearing, supine AA/ROMfollow up on HEP and standing, continue with functional reaching and speed tasks, standing activity      Lurena Joiner, OTR/L  [phone] 10/06/2021, 2:29 PM

## 2021-10-24 ENCOUNTER — Encounter (HOSPITAL_COMMUNITY): Payer: Self-pay

## 2021-10-24 ENCOUNTER — Ambulatory Visit (HOSPITAL_COMMUNITY): Payer: 59 | Attending: Family Medicine

## 2021-10-24 DIAGNOSIS — M6281 Muscle weakness (generalized): Secondary | ICD-10-CM | POA: Diagnosis present

## 2021-10-24 DIAGNOSIS — R278 Other lack of coordination: Secondary | ICD-10-CM | POA: Insufficient documentation

## 2021-10-24 DIAGNOSIS — R29898 Other symptoms and signs involving the musculoskeletal system: Secondary | ICD-10-CM | POA: Insufficient documentation

## 2021-10-24 DIAGNOSIS — R29818 Other symptoms and signs involving the nervous system: Secondary | ICD-10-CM | POA: Diagnosis present

## 2021-10-24 DIAGNOSIS — R2689 Other abnormalities of gait and mobility: Secondary | ICD-10-CM | POA: Diagnosis present

## 2021-10-24 NOTE — Therapy (Signed)
OUTPATIENT PHYSICAL THERAPY TREATMENT NOTE  [name] MRN: [id number] DOB:June 17, 1965, 56 y.o., female Today's Date: 10/24/2021  PCP: Neale Burly, MD REFERRING PROVIDER: Curly Rim, MD     See note below for Objective Data and Assessment of Progress/Goals  END OF SESSION:   PT End of Session - 10/24/21 1511     Visit Number 17    Number of Visits 23    Date for PT Re-Evaluation 10/27/21    Authorization Type [employer] (60 VL 10 used, 47 remain)    Authorization - Visit Number 84    Authorization - Number of Visits 59    Progress Note Due on Visit 23    PT Start Time 1512    PT Stop Time 1552    PT Time Calculation (min) 40 min    Equipment Utilized During Treatment Gait belt    Activity Tolerance Patient tolerated treatment well;Patient limited by fatigue    Behavior During Therapy WFL for tasks assessed/performed                 Past Medical History:  Diagnosis Date   Acute heart failure ([place])    ADD (attention deficit disorder)    Bronchitis    Depression    Diabetes mellitus without complication ([place])    Elevated troponin    Hypertension    Obesity    Past Surgical History:  Procedure Laterality Date   CESAREAN SECTION     Patient Active Problem List   Diagnosis Date Noted   Chronic combined systolic and diastolic congestive heart failure ([place]) 08/25/2020   Acute CVA (cerebrovascular accident) ([place]) 08/24/2020   Borderline prolonged QT interval 08/24/2020   Type 2 diabetes mellitus with complication, without long-term current use of insulin ([place]) 03/16/2020   Anasarca    Diabetes mellitus ([place])    Hypokalemia    Vitamin D deficiency    Hypothyroidism    Physical deconditioning    Acute on chronic systolic CHF (congestive heart failure) ([place]) 03/09/2020   Acute on chronic systolic HF (heart failure) ([place]) 03/09/2020   Hyperglycemia 12/22/2018   Hypertensive crisis 04/04/2018   Acute CHF (congestive heart  failure) ([place]) 04/04/2018   Mild renal insufficiency 04/04/2018   Elevated troponin 04/04/2018   Bronchitis    Obesity    Seborrheic dermatitis of scalp 05/23/2016   Essential hypertension 12/02/2015   Depression 12/02/2015   Primary insomnia 12/02/2015   Morbid obesity with body mass index of 50.0-59.9 in adult ([place]) 12/02/2015   Attention deficit hyperactivity disorder (ADHD), predominantly inattentive type 12/02/2015    REFERRING DIAG: Z86.73 (ICD-10-CM) - Personal history of transient ischemic attack (TIA), and cerebral infarction without residual deficits     THERAPY DIAG:  Other lack of coordination  Other abnormalities of gait and mobility  Other symptoms and signs involving the musculoskeletal system  Muscle weakness (generalized)  PERTINENT HISTORY:  Essential hypertension;  Hypothyroidism due to Hashimoto's thyroiditis;  CVD (cerebrovascular disease);  History of CVA (cerebrovascular accident);  Type 2 diabetes mellitus with hyperglycemia   PRECAUTIONS: fall   SUBJECTIVE: Pt states that she doesn't like her leg shaking so much and doesn't know why.  She reports she has been doing lots of sit to stands at sink but then just sits around otherwise.   PAIN:  Are you having pain? Yes,  7/10 initially  OBJECTIVE:        TODAY'S TREATMENT:  10/25/19:  Patient present with mom and manual WC and  presenting with R LE clonus of concern to patient.    - Sitting in [facility] for discussion on clonus and practice on deep pressure from L UE into R knee downward x 30 sec x 2 or through weight shift into R LE    - Sitting hamstring/nerve stretch with belt x 30 sec x 3 reps   - Discussion and HEP review    - LAQ x 10 reps    - Heel/toe raises x 10 reps    - sitting marching x 10 reps    - sit to stand x 10 reps   Transfer to right through lateral step with minA x 4 steps onto plinth   - Lateral scoot trial x 4 to R, maxA to sidelying R to roll to back    - Cont HEP review in bed  with bed mobility handouts    - Supine Bridge x 10     - Supine marching x 10 reps   Return to sitting EOB with modA   - Transfer sit to stand with hemi walker and CGA   - Transfer side step to left x 4 with minA and then down into [facility]   10/06/19 Stand:  Advancing RT leg x 3 3 reps              Lifting RT leg off the floor x 3 3 reps               Wt shifting x 5               Gt with hemi walker  1 time 20" second time 23 ft  Sit:      Sit to stand x 15             Resisted LAQ x 10             Resisted hip abduction x 10/adduction x 10               Lean back then pull forward to work abdominals x 10             Paloff one hand x 10   both sides             PNF 1 and 2 x 10 with blue theraband. (Hand to Rt knee; Hand to Rt shoulder )                            09/27/21 Gait training Rt UE platform walker: 3 sets 1'43" 62ft; 2'23" 44ft; 57" 51ft due to LE fatigue and trimmers Pt able to stand with Lt UE pushing from chair 5x Bed mobility MMT Reviewed goals  09/15/21 Pull to stand at // bar 2x5 Ambulation using Rt UE platform walker, 10', 20', 10' walks with rest between Leg press left leg 3 plates x20, right leg 2 plates x20 with cues to control knee hyperextension  09/13/21 Pull to stand at // bar 2x10 Ambulation using Rt UE platform walker,  3', attempted second stand Nustep x5 mins, b/l LE, uni R UE Seated quad extensions at cc with 20# x20                   Italics below from Evaluation on 07/13/2021 DIAGNOSTIC FINDINGS: MRI 08/24/20 IMPRESSION: Acute lacunar type infarct involving the posterior limb of the left internal capsule. Additional small subacute lacunar type infarct involving the left periatrial white matter.  MMT Right 07/13/2021 Left 07/13/2021 Right 08/24/2021 Left 08/24/2021 Right 09/27/21 Left  09/27/21  Hip flexion 3-/5 5/5 3- 5 Supine: 3+/5 Supine:  5/5  Hip extension  2-   2+ 4- Prone: 2+ Prone 4-/5  Hip abduction  3-     Sidelying:   Flexed hip 3/5  Sidelying: Compensates with flexed hip 4+/5  Hip adduction          Hip internal rotation          Hip external rotation          Knee flexion 2/5tested prone 5/5 2+ 5 Prone: 2+ Prone: 5/5  Knee extension 4-/5 5/_0 4/5 5/5  Ankle dorsiflexion 3/5 5/5 2- 5 2/5 5/5  Sit to stand and transfer with mod assist with pt having a posterior lean. Sit to supine with mod assist Supine to side lying to prone with min assist   TX:  sit to stand x 5;                 Sitting:  LAQ x 10 with manual resistance                 Side lying hip abduction x 5                Supine:  bridge x 10                        SLR x 10               Isometric ab/adduction x 10                        Clam x 10                       Dead bug with therapist assist of RT UE x 10                 Prone:  Knee flexion, SLR x 10@    07/13/2021  LE ROM:    WFL for tasks assessed    MMT:     MMT Right 07/13/2021 Left 07/13/2021  Hip flexion 3-/5 5/5  Hip extension      Hip abduction      Hip adduction      Hip internal rotation      Hip external rotation      Knee flexion 3+/5 5/5  Knee extension 4-/5 5/5  Ankle dorsiflexion 3/5 5/5   Ankle plantarflexion      Ankle inversion      Ankle eversion      (Blank rows = not tested)     TRANSFERS: Assistive device utilized: Hemi walker and Wheelchair (manual)  Sit to stand: Mod A Stand to sit: Mod A       GAIT: Gait pattern: ataxic Distance walked: 3 feet Assistive device utilized:  hemiwalker Level of assistance:  min/mod assist Comments: unsteady   PATIENT EDUCATION:  Education details: 08/24/2021 progress and plan of care. Patient educated on exam findings, POC, scope of PT, HEP, and increasing activity. 08/04/21 = education on nerve tension, nerve and muscle lengthening, clonus reaction and pressure to stop; and mostly focused on education of increased movement for unweighting "wiggle throughout day" 10/24/21: HEP review, clonus,  increased WB after Person educated: Patient Education method: Explanation, Demonstration, and Handouts Education comprehension: verbalized understanding, returned demonstration, verbal cues required,  and tactile cues required      HOME EXERCISE PROGRAM: EValuation:Marching 2x 10                     LAQ 2x 10 5 Second                    Heel/toe raises 2 x 10 5 second hold              07/21/2021  Side lying hip abduction x 5                Supine:  bridge x 10                                SLR x 10        4/11: sidelying forearm push up exercises  7/11: Reviewed patient her HEP currently sit to stand at sink, walking with parents help and prior HEP All these prior given with new handouts below   Access Code: [email or web address] URL: [email or web address] Date: 10/24/2021 Prepared by: Jerilynn Som  Exercises - Seated Heel Toe Raises  - 2 x daily - 7 x weekly - 2 sets - 10 reps - Seated Long Arc Quad  - 2 x daily - 7 x weekly - 2 sets - 10 reps - Seated March  - 2 x daily - 7 x weekly - 2 sets - 10 reps - Seated Hamstring Stretch with Strap  - 1 x daily - 7 x weekly - 1 sets - 2 reps - 30 seconds hold - Supine Bridge  - 1 x daily - 7 x weekly - 3 sets - 10 reps - Supine March  - 1 x daily - 7 x weekly - 3 sets - 10 reps - Sit to Stand with Counter Support  - 1 x daily - 7 x weekly - 2 sets - 10 reps  Patient Education - Rolling From Your Back to Your Side - Bed Mobility     GOALS: Goals reviewed with patient? Yes   SHORT TERM GOALS: Target date: 10/14/2021   Patient will be independent with HEP in order to improve functional outcomes. Baseline: 09/27/21:  Reports she has been pulling up and standing every hour, stands for one minute.  Pt unsure with current HEP program. Goal status: ongoing    2.  Patient will report at least 25% improvement in symptoms for improved quality of life. Baseline:  09/27/21: Reports improvements by 35%, improvements with mobility, gait, and  strength. Goal status:met     LONG TERM GOALS: Target date: 10/27/2021   Patient will report at least 75% improvement in symptoms for improved quality of life. Baseline:  09/27/21: Reports improvements by 35%, improvements with mobility, gait, and strength. Goal status: ongoing    2.  Patient will be able to perform sit to stand without UE use in order to demonstrate improved functional strength. Baseline: 09/27/21: Able to stand with Lt UE pushing from chair with CGA, increased fatigue requires min A  08/24/21 needs min to mod A today due to increased back pain Goal status: ongoing    3.  Patient will be able to stand/walk for 5 minutes with LRAD use for balance in order to complete household tasks. Baseline: 6/14: Gait training Rt UE platform walker: 3 sets 1'43" 43ft; 2'23" 22ft; 57" 72ft 08/24/21 walking 5' and then 10' ft at recent session.  30 sec  to 1 min at a time.  Goal status: ongoing        ASSESSMENT:   CLINICAL IMPRESSION: Today's session focused on review of HEP due to overall non-compliance and as patient is due for re-assess next visits and overall progress is minimal.  Her bed mobility and transfers continued to be of challenge in session. During activities today, continued limitations from back pain and overheating today causes poor tolerance.    OBJECTIVE IMPAIRMENTS Abnormal gait, decreased activity tolerance, decreased balance, decreased endurance, decreased knowledge of use of DME, decreased mobility, difficulty walking, decreased ROM, decreased strength, impaired flexibility, impaired UE functional use, improper body mechanics, and obesity.    ACTIVITY LIMITATIONS cleaning, community activity, driving, meal prep, occupation, laundry, yard work, shopping, and yard work.    PERSONAL FACTORS Behavior pattern, Fitness, Past/current experiences, Time since onset of injury/illness/exacerbation, and 3+ comorbidities: CVA, increased BMI, sedentary   are also affecting  patient's functional outcome.      REHAB POTENTIAL: Good   CLINICAL DECISION MAKING: Evolving/moderate complexity   EVALUATION COMPLEXITY: Moderate   PLAN: PT FREQUENCY: 2x/week   PT DURATION: 4 weeks   PLANNED INTERVENTIONS: Therapeutic exercises, Therapeutic activity, Neuromuscular re-education, Balance training, Gait training, Patient/Family education, Joint manipulation, Joint mobilization, Stair training, Orthotic/Fit training, DME instructions, Aquatic Therapy, Dry Needling, Electrical stimulation, Spinal manipulation, Spinal mobilization, Cryotherapy, Moist heat, Compression bandaging, scar mobilization, Splintting, Taping, Traction, Ultrasound, Ionotophoresis 104m/ml Dexamethasone, and Manual therapy    PLAN FOR NEXT SESSION: Goal of walking 30 ft.  continue to work on strengthening, bed mobility, gait and transfer training, standing balance and increasing overall functional mobility and decreasing fall risk.    5:50 PM, 10/24/21  PMargarette Asal CCarlis AbbottPT, DPT  Contract Physical Therapist at  [phone]

## 2021-10-27 ENCOUNTER — Encounter (HOSPITAL_COMMUNITY): Payer: Self-pay

## 2021-10-27 ENCOUNTER — Ambulatory Visit (HOSPITAL_COMMUNITY): Payer: 59 | Admitting: Occupational Therapy

## 2021-10-27 ENCOUNTER — Encounter (HOSPITAL_COMMUNITY): Payer: Self-pay | Admitting: Occupational Therapy

## 2021-10-27 ENCOUNTER — Ambulatory Visit (HOSPITAL_COMMUNITY): Payer: 59

## 2021-10-27 DIAGNOSIS — R278 Other lack of coordination: Secondary | ICD-10-CM

## 2021-10-27 DIAGNOSIS — R29818 Other symptoms and signs involving the nervous system: Secondary | ICD-10-CM

## 2021-10-27 DIAGNOSIS — R29898 Other symptoms and signs involving the musculoskeletal system: Secondary | ICD-10-CM

## 2021-10-27 NOTE — Therapy (Signed)
OUTPATIENT OCCUPATIONAL THERAPY TREATMENT NOTE   [name] MRN: [id number] DOB:[birth year], 56 y.o., female Today's Date: 10/27/2021  PCP: Toma Deiters, MD REFERRING PROVIDER: Delano Metz, MD    END OF SESSION:   OT End of Session - 10/27/21 1448     Visit Number 12    Number of Visits 24    Date for OT Re-Evaluation 10/31/21    Authorization Type 1) [employer] 2) [place] medicaid [employer]    Authorization Time Period 1) no copay 60 visit limit  2) No auth required for patients over 21 at this time. Adults - 27 visits per calendar year OT/PT/SLP    Authorization - Visit Number 12    Authorization - Number of Visits 60    OT Start Time 1300    OT Stop Time 1345    OT Time Calculation (min) 45 min    Activity Tolerance Patient tolerated treatment well    Behavior During Therapy WFL for tasks assessed/performed                      Past Medical History:  Diagnosis Date   Acute heart failure ([facility])    ADD (attention deficit disorder)    Bronchitis    Depression    Diabetes mellitus without complication ([facility])    Elevated troponin    Hypertension    Obesity    Past Surgical History:  Procedure Laterality Date   CESAREAN SECTION     Patient Active Problem List   Diagnosis Date Noted   Chronic combined systolic and diastolic congestive heart failure ([facility]) 08/25/2020   Acute CVA (cerebrovascular accident) ([facility]) 08/24/2020   Borderline prolonged QT interval 08/24/2020   Type 2 diabetes mellitus with complication, without long-term current use of insulin ([facility]) 03/16/2020   Anasarca    Diabetes mellitus ([facility])    Hypokalemia    Vitamin D deficiency    Hypothyroidism    Physical deconditioning    Acute on chronic systolic CHF (congestive heart failure) ([facility]) 03/09/2020   Acute on chronic systolic HF (heart failure) ([facility]) 03/09/2020   Hyperglycemia 12/22/2018   Hypertensive crisis 04/04/2018   Acute CHF (congestive heart failure) ([facility])  04/04/2018   Mild renal insufficiency 04/04/2018   Elevated troponin 04/04/2018   Bronchitis    Obesity    Seborrheic dermatitis of scalp 05/23/2016   Essential hypertension 12/02/2015   Depression 12/02/2015   Primary insomnia 12/02/2015   Morbid obesity with body mass index of 50.0-59.9 in adult [facility]) 12/02/2015   Attention deficit hyperactivity disorder (ADHD), predominantly inattentive type 12/02/2015    ONSET DATE: 08/23/20  REFERRING DIAG: Left CVA with right side weakness  THERAPY DIAG:  Other lack of coordination  Other symptoms and signs involving the musculoskeletal system  Other symptoms and signs involving the nervous system  Rationale for Evaluation and Treatment Rehabilitation   PERTINENT HISTORY: Patient sustained an acute lacunar infarct on the left side causing right side weakness. Patient reports that she received therapy services at [facility] for 2.5 weeks, and HH OT and PT for 1 month. Does not know the date when she sees Dr. Salvadore Farber for a follow-up. She is currently using a borrowed manual wheelchair from [facility], [place] she is unaware when she needs to return it.   PRECAUTIONS: Fall and Other: right side hemiparesis  SUBJECTIVE: S: I am just so tired today  PAIN:  Are you having pain? No     OBJECTIVE:  UE ROM      Active ROM - seated. IR/er adducted Right 08/08/2021 Right 09/18/2021  Shoulder flexion 0 0  Shoulder abduction 50 50  Shoulder internal rotation 60 60  Shoulder external rotation 0 - UE positioning assist 20 -UE positioning assist  Elbow flexion 136 140  Elbow extension -21 -4  Wrist flexion  - gravity eliminated 66 66  Wrist extension - gravity eliminated 0 0  Wrist ulnar deviation unable unable  Wrist radial deviation unable unable  Wrist pronation unable unable  Wrist supination 0 unable  (Blank rows = not tested)     UE MMT:      MMT - seated. IR/er adducted Right 08/08/2021 Right  09/18/2021  Shoulder  flexion 3-/5 3-/5  Shoulder abduction 3-/5 3-/5  Shoulder internal rotation 3/5 3/5  Shoulder external rotation 3-/5 3-/5  Elbow flexion 3/5 3/5  Elbow extension 3-/5 3/5  Wrist flexion 3/5 4-/5  Wrist extension 3-/5 3-/5  Wrist ulnar deviation 0/5 0/5  Wrist radial deviation 0/5 0/5  Wrist pronation 3-/5 3-/5  Wrist supination 3-/5 3-/5  (Blank rows = not tested)   HAND FUNCTION: Grip strength: Right: 6 lbs (3 lbs previous); Left: 58 lbs and Lateral pinch: Right: 5 lbs (4 lbs previous), Left: 12 lbs   COORDINATION: Unable to test 9 hole peg test  GOALS:     SHORT TERM GOALS: Target date: 09/19/2021   Patient/caregiver will be educated on HEP in order to facilitate her progress in therapy and increase her functional performance when completing ADL tasks and move towards more functional use of her RUE.  Baseline: Goal status: on-going   2.  Patient will complete bathing and dressing tasks at moderate assist level while seated and using a hemi dressing technique.  Baseline:  Goal status: on-going   3.  Patient will increase her RUE A/ROM by 10 degrees where able in order to increase her ability to assist and complete basic ADL tasks.  Baseline:  Goal status:on-going   4.  Patient will demonstrate an increase her right hand coordination in order to complete the 9 hole peg test with assist.  Baseline:  Goal status:on-going   5.  Patient will increase her hand hand grip and pinch strength by 2-3lbs in order to progress towards using her right hand to stabilize items when eating, grooming, etc. Baseline:  Goal status: on-going     LONG TERM GOALS: Target date: 10/31/2021   Patient will complete bathing,dressing, and grooming tasks at min assist level while seated and using a hemi dressing technique.  Baseline:  Goal status:on-going   2.  Patient will be able to demonstrate a tub/shower transfer at min assist using a tub bench in order to return to taking shower with  caregiver assisting. Baseline:  Goal status: on-going   3.  Patient will increase her RUE A/ROM in order to use her right arm/hand as an active assist when completing dressing, bathing, and grooming tasks.  Baseline:  Goal status: on-going   4.  Patient will demonstrate an  increase her right hand coordination by completing the 9 hole peg test without assistance. Baseline:  Goal status: on-going   5.  Patient will increase her right hand grip and pinch strength to 10% of average norms for age in order to progress her ability to use her hand as an active assist.  Baseline:  Goal status: on-going   PATIENT EDUCATION: Education details: discussed HEP completion Person educated: patient, mother Education method: explanation  Education comprehension: verbalized understanding     HOME EXERCISE PROGRAM: 5/18: AA/ROM - shoulder supine and seated (table), elbow AA/ROM at table.  TODAY'S TREATMENT:  10/27/21  - Weight bearing: standing at table bearing weight through RUE for 1 minute, weight bearing activity in standing- using left hand to pick up pegs and lean into right side to place on peg board 10 pegs 2x - AA/ROM- seated at table with RUE secured to PVC pipe with mod assist to support RUE during shoulder flexion 10 reps and shoulder protraction 10 reps, rest breaks given - Table slides: sitting at table with UE on towel, shoulder abduction and shoulder flexion 10x each direction, increased effort required this session  - functional reaching: seated at table with RUE on towel reaching to slide cones in arc shape, completed 2 x  - NMES:  targeting wrist extensors, Russian, 16 mA CC, 10'- OT providing mod assist for first 5 minutes to achieve full wrist extension- unable to tolerate NMES on higher setting to initiate wrist extension       10/06/21  - Towel slides: seated at table with hand on towel, shoulder flexion, horizontal abduction 10x each  - Weight bearing- standing with OT 1  min. Completed sit to stand transfer independently, CGA while standing - Weight bearing with functional reaching activity- standing at table using L hand to pick up coins and place in container on R side of body - functional reach seated at table: reaching for colored cones, OT calling out which color to reach (shoulder flexion) - A/AROM: sitting at table, hand secured to PVC pipe- completing shoulder flexion and protraction with min assist to support R UE (5 reps of protraction completed independently before requiring min assist)  - NMES: targeting wrist extensors, Russian, 39 mA CC, 10'- OT providing mod assist for last 5 minutes to achieve full wrist extension     09/29/21  - Towel slides: seated at table with hand on towel, shoulder flexion, horizontal abduction 10x each  - Weight bearing: standing with OT providing min assist for elbow extension and hand positioning, weightbearing onto RUE for 1 minute, 2x - Functional reaching: sitting at table with hand on towel, reaching in all direction to knock over jenga blocks- 9 blocks 2x- noted increased difficulty and energy exertion with task   - PROM: wrist and fingers to decrease edema, held wrist flexion 10 seconds 2x, held full fist 10 seconds 2x - AA/ROM: R hand secured to PVC pipe with green theraband, half wedge behind back for positioning; shoulder protraction, flexion, 10 repetitions, rest breaks as needed  - Issued size small edema glove and gave education on donning/doffing and wearing schedule  -NMES: targeting wrist extensors, Russian, 36 mA CC, 10'- OT providing mod assist to achieve full wrist extension       CLINICAL IMPRESSION: A: Pt. Reports that she is very tired today. Noted increase tightness in RUE when completing ROM and pt. Reported increased pain in shoulder area. Able to complete standing weightbearing activity with min assist to support RUE. She reports that she has been wearing edema glove on and off and has not noticed  an swelling recently. Unable to progress NMES today due to increased amount of pain- able to reach 16 mA CC without any spontaneous wrist extension, OT providing assist to reach wrist extension.   PLAN: OT FREQUENCY: 2x/week   OT DURATION: 12 weeks   PLANNED INTERVENTIONS: self care/ADL training, therapeutic exercise, therapeutic activity, neuromuscular re-education, manual therapy, passive  range of motion, balance training, splinting, electrical stimulation, ultrasound, moist heat, cryotherapy, patient/family education, visual/perceptual remediation/compensation, psychosocial skills training, energy conservation, coping strategies training, and DME and/or AE instructions   RECOMMENDED OTHER SERVICES: Manual wheelchair evaluation from mobility specialist   CONSULTED AND AGREED WITH PLAN OF CARE: Patient and family member/caregiver   PLAN FOR NEXT SESSION: P: Continue with NMES, weightbearing, supine AA/ROMfollow up on HEP and standing, continue with functional reaching and speed tasks, standing activity      Lurena Joiner, OTR/L  [phone] 10/27/2021, 2:49 PM

## 2021-10-30 ENCOUNTER — Ambulatory Visit (HOSPITAL_COMMUNITY): Payer: 59

## 2021-10-30 DIAGNOSIS — R278 Other lack of coordination: Secondary | ICD-10-CM | POA: Diagnosis not present

## 2021-10-30 DIAGNOSIS — R29818 Other symptoms and signs involving the nervous system: Secondary | ICD-10-CM

## 2021-10-30 DIAGNOSIS — M6281 Muscle weakness (generalized): Secondary | ICD-10-CM

## 2021-10-30 DIAGNOSIS — R29898 Other symptoms and signs involving the musculoskeletal system: Secondary | ICD-10-CM

## 2021-10-30 DIAGNOSIS — R2689 Other abnormalities of gait and mobility: Secondary | ICD-10-CM

## 2021-10-30 NOTE — Therapy (Signed)
OUTPATIENT PHYSICAL THERAPY PROGRESS  NOTE   Progress Note Reporting Period 07/13/2021 to 10/30/2021  See note below for Objective Data and Assessment of Progress/Goals.      [name] MRN: [id number] DOB:[birth year], 56 y.o., female Today's Date: 10/30/2021  PCP: Neale Burly, MD REFERRING PROVIDER: Curly Rim, MD     See note below for Objective Data and Assessment of Progress/Goals  END OF SESSION:   PT End of Session - 10/30/21 1304     Visit Number 18    Number of Visits 23    Date for PT Re-Evaluation 10/27/21    Authorization Type [employer] (60 VL 10 used, 48 remain)    Authorization - Visit Number 71    Authorization - Number of Visits 59    Progress Note Due on Visit 23    PT Start Time 1303    PT Stop Time 1344    PT Time Calculation (min) 41 min    Equipment Utilized During Treatment Gait belt    Activity Tolerance Patient tolerated treatment well;Patient limited by fatigue    Behavior During Therapy WFL for tasks assessed/performed                  Past Medical History:  Diagnosis Date   Acute heart failure ([place])    ADD (attention deficit disorder)    Bronchitis    Depression    Diabetes mellitus without complication ([place])    Elevated troponin    Hypertension    Obesity    Past Surgical History:  Procedure Laterality Date   CESAREAN SECTION     Patient Active Problem List   Diagnosis Date Noted   Chronic combined systolic and diastolic congestive heart failure ([place]) 08/25/2020   Acute CVA (cerebrovascular accident) ([place]) 08/24/2020   Borderline prolonged QT interval 08/24/2020   Type 2 diabetes mellitus with complication, without long-term current use of insulin ([place]) 03/16/2020   Anasarca    Diabetes mellitus ([place])    Hypokalemia    Vitamin D deficiency    Hypothyroidism    Physical deconditioning    Acute on chronic systolic CHF (congestive heart failure) ([place]) 03/09/2020   Acute on chronic  systolic HF (heart failure) ([place]) 03/09/2020   Hyperglycemia 12/22/2018   Hypertensive crisis 04/04/2018   Acute CHF (congestive heart failure) ([place]) 04/04/2018   Mild renal insufficiency 04/04/2018   Elevated troponin 04/04/2018   Bronchitis    Obesity    Seborrheic dermatitis of scalp 05/23/2016   Essential hypertension 12/02/2015   Depression 12/02/2015   Primary insomnia 12/02/2015   Morbid obesity with body mass index of 50.0-59.9 in adult ([place]) 12/02/2015   Attention deficit hyperactivity disorder (ADHD), predominantly inattentive type 12/02/2015    REFERRING DIAG: Z86.73 (ICD-10-CM) - Personal history of transient ischemic attack (TIA), and cerebral infarction without residual deficits     THERAPY DIAG:  Other lack of coordination  Other symptoms and signs involving the musculoskeletal system  Other symptoms and signs involving the nervous system  Other abnormalities of gait and mobility  Muscle weakness (generalized)  PERTINENT HISTORY:  Essential hypertension;  Hypothyroidism due to Hashimoto's thyroiditis;  CVD (cerebrovascular disease);  History of CVA (cerebrovascular accident);  Type 2 diabetes mellitus with hyperglycemia   PRECAUTIONS: fall   SUBJECTIVE: Patient states she is doing about the same. "Same ole Same ole".  Mother feels she is doing more. No pain today. Eventually she states she is 50-75% better than she was just after  her stroke.    PAIN:  Are you having pain? Yes,  0/10 initially  OBJECTIVE:        TODAY'S TREATMENT:  10/30/21 Progress note WC to mat table squat pivot transfer with CGA Sit on EOB with 1 UE support x 5 min Sit to supine min A for legs Supine to sit mod A to brink trunk upright Roll on mat supine to prone modified independent Sit to stand with hemi walker from mat table x 3 CGA   Bridge x 10  Ambulation with hemi-walker and CGA with WC following x 4 ft; then rest and again x 2 steps with hemi walker and CGA; noted  Right ankle instability/ foot drop         10/25/19:  Patient present with mom and manual WC and presenting with R LE clonus of concern to patient.    - Sitting in [facility] for discussion on clonus and practice on deep pressure from L UE into R knee downward x 30 sec x 2 or through weight shift into R LE    - Sitting hamstring/nerve stretch with belt x 30 sec x 3 reps   - Discussion and HEP review    - LAQ x 10 reps    - Heel/toe raises x 10 reps    - sitting marching x 10 reps    - sit to stand x 10 reps   Transfer to right through lateral step with minA x 4 steps onto plinth   - Lateral scoot trial x 4 to R, maxA to sidelying R to roll to back    - Cont HEP review in bed with bed mobility handouts    - Supine Bridge x 10     - Supine marching x 10 reps   Return to sitting EOB with modA   - Transfer sit to stand with hemi walker and CGA   - Transfer side step to left x 4 with minA and then down into [facility]   10/06/19 Stand:  Advancing RT leg x 3 3 reps              Lifting RT leg off the floor x 3 3 reps               Wt shifting x 5               Gt with hemi walker  1 time 20" second time 23 ft  Sit:      Sit to stand x 15             Resisted LAQ x 10             Resisted hip abduction x 10/adduction x 10               Lean back then pull forward to work abdominals x 10             Paloff one hand x 10   both sides             PNF 1 and 2 x 10 with blue theraband. (Hand to Rt knee; Hand to Rt shoulder )                            09/27/21 Gait training Rt UE platform walker: 3 sets 1'43" 38f; 2'23" 289f 57" [id number] to LE fatigue and trimmers Pt able to stand with Lt UE pushing from  chair 5x Bed mobility MMT Reviewed goals  09/15/21 Pull to stand at // bar 2x5 Ambulation using Rt UE platform walker, 10', 20', 10' walks with rest between Leg press left leg 3 plates x20, right leg 2 plates x20 with cues to control knee hyperextension  09/13/21 Pull to stand at // bar  2x10 Ambulation using Rt UE platform walker,  3', attempted second stand Nustep x5 mins, b/l LE, uni R UE Seated quad extensions at cc with 20# x20                   Italics below from Evaluation on 07/13/2021 DIAGNOSTIC FINDINGS: MRI 08/24/20 IMPRESSION: Acute lacunar type infarct involving the posterior limb of the left internal capsule. Additional small subacute lacunar type infarct involving the left periatrial white matter.             MMT Right 07/13/2021 Left 07/13/2021 Right 08/24/2021 Left 08/24/2021 Right 09/27/21 Left  09/27/21 Right 10/30/21 Left 10/30/21  Hip flexion 3-/5 5/5 3- 5 Supine: 3+/5 Supine:  5/5 Sitting 3- 5  Hip extension  2-   2+ 4- Prone: 2+ Prone 4-/5    Hip abduction  3-     Sidelying:  Flexed hip 3/5  Sidelying: Compensates with flexed hip 4+/5 Sidelying Flexed hip 3+ 4+  Hip adduction            Hip internal rotation            Hip external rotation            Knee flexion 2/5tested prone 5/5 2+ 5 Prone: 2+ Prone: 5/5 Prone 3- Prone 5  Knee extension 4-/5 5/5 4 5  4/5 5/5 4+ 5  Ankle dorsiflexion 3/5 5/5 2- 5 2/5 5/5 2 5   Sit to stand and transfer with mod assist with pt having a posterior lean. Sit to supine with mod assist Supine to side lying to prone with min assist   TX:  sit to stand x 5;                 Sitting:  LAQ x 10 with manual resistance                 Side lying hip abduction x 5                Supine:  bridge x 10                        SLR x 10               Isometric ab/adduction x 10                        Clam x 10                       Dead bug with therapist assist of RT UE x 10                 Prone:  Knee flexion, SLR x 10@    07/13/2021  LE ROM:    WFL for tasks assessed    MMT:     MMT Right 07/13/2021 Left 07/13/2021  Hip flexion 3-/5 5/5  Hip extension      Hip abduction      Hip adduction      Hip internal rotation      Hip external rotation  Knee flexion 3+/5 5/5  Knee extension 4-/5 5/5   Ankle dorsiflexion 3/5 5/5   Ankle plantarflexion      Ankle inversion      Ankle eversion      (Blank rows = not tested)     TRANSFERS: Assistive device utilized: Hemi walker and Wheelchair (manual)  Sit to stand: Mod A Stand to sit: Mod A       GAIT: Gait pattern: ataxic Distance walked: 3 feet Assistive device utilized:  hemiwalker Level of assistance:  min/mod assist Comments: unsteady   PATIENT EDUCATION:  Education details: 08/24/2021 progress and plan of care. Patient educated on exam findings, POC, scope of PT, HEP, and increasing activity. 08/04/21 = education on nerve tension, nerve and muscle lengthening, clonus reaction and pressure to stop; and mostly focused on education of increased movement for unweighting "wiggle throughout day" 10/24/21: HEP review, clonus, increased WB after Person educated: Patient Education method: Explanation, Demonstration, and Handouts Education comprehension: verbalized understanding, returned demonstration, verbal cues required, and tactile cues required      HOME EXERCISE PROGRAM: EValuation:Marching 2x 10                     LAQ 2x 10 5 Second                    Heel/toe raises 2 x 10 5 second hold              07/21/2021  Side lying hip abduction x 5                Supine:  bridge x 10                                SLR x 10        4/11: sidelying forearm push up exercises  7/11: Reviewed patient her HEP currently sit to stand at sink, walking with parents help and prior HEP All these prior given with new handouts below   Access Code: [email or web address] URL: [email or web address] Date: 10/24/2021 Prepared by: Jerilynn Som  Exercises - Seated Heel Toe Raises  - 2 x daily - 7 x weekly - 2 sets - 10 reps - Seated Long Arc Quad  - 2 x daily - 7 x weekly - 2 sets - 10 reps - Seated March  - 2 x daily - 7 x weekly - 2 sets - 10 reps - Seated Hamstring Stretch with Strap  - 1 x daily - 7 x weekly - 1 sets - 2 reps - 30 seconds  hold - Supine Bridge  - 1 x daily - 7 x weekly - 3 sets - 10 reps - Supine March  - 1 x daily - 7 x weekly - 3 sets - 10 reps - Sit to Stand with Counter Support  - 1 x daily - 7 x weekly - 2 sets - 10 reps  Patient Education - Rolling From Your Back to Your Side - Bed Mobility     GOALS: Goals reviewed with patient? Yes   SHORT TERM GOALS: Target date: 10/14/2021   Patient will be independent with HEP in order to improve functional outcomes. Baseline: 09/27/21:  Reports she has been pulling up and standing every hour, stands for one minute.  Pt unsure with current HEP program. Goal status: ongoing    2.  Patient will report at least 25% improvement in symptoms  for improved quality of life. Baseline:  09/27/21: Reports improvements by 35%, improvements with mobility, gait, and strength. Goal status:met     LONG TERM GOALS: Target date: 10/27/2021   Patient will report at least 75% improvement in symptoms for improved quality of life. Baseline:  09/27/21: Reports improvements by 35%, improvements with mobility, gait, and strength. Goal status: ongoing    2.  Patient will be able to perform sit to stand without UE use in order to demonstrate improved functional strength. Baseline: 10/30/21 can stand up with left upper extremity assist with CGA. 09/27/21: Able to stand with Lt UE pushing from chair with CGA, increased fatigue requires min A  08/24/21 needs min to mod A today due to increased back pain Goal status: ongoing    3.  Patient will be able to stand/walk for 5 minutes with LRAD use for balance in order to complete household tasks. Baseline: 6/14: Gait training Rt UE platform walker: 3 sets 1'43" 61f; 2'23" 261f 57" 47f9f/11/23 walking 5' and then 10' ft at recent session.  30 sec to 1 min at a time.  Goal status: ongoing        ASSESSMENT:   CLINICAL IMPRESSION: Progress note today; patient with noted improvement in strength and needs less assist with bed mobility and  transfers; needs further education on log roll for left side lying to sit. Trial of use of heme-walker today; noted right ankle instablity/foot drop today. Discussed AFO purchase with patient and mother present.  Patient continues with anxiety with walking; reports "my leg gives way" and needs frequent rests.  Patient will benefit from continued skilled therapy interventions to address deficits and improve functional mobility.   OBJECTIVE IMPAIRMENTS Abnormal gait, decreased activity tolerance, decreased balance, decreased endurance, decreased knowledge of use of DME, decreased mobility, difficulty walking, decreased ROM, decreased strength, impaired flexibility, impaired UE functional use, improper body mechanics, and obesity.    ACTIVITY LIMITATIONS cleaning, community activity, driving, meal prep, occupation, laundry, yard work, shopping, and yard work.    PERSONAL FACTORS Behavior pattern, Fitness, Past/current experiences, Time since onset of injury/illness/exacerbation, and 3+ comorbidities: CVA, increased BMI, sedentary   are also affecting patient's functional outcome.      REHAB POTENTIAL: Good   CLINICAL DECISION MAKING: Evolving/moderate complexity   EVALUATION COMPLEXITY: Moderate   PLAN: PT FREQUENCY: 2x/week   PT DURATION: 4 weeks   PLANNED INTERVENTIONS: Therapeutic exercises, Therapeutic activity, Neuromuscular re-education, Balance training, Gait training, Patient/Family education, Joint manipulation, Joint mobilization, Stair training, Orthotic/Fit training, DME instructions, Aquatic Therapy, Dry Needling, Electrical stimulation, Spinal manipulation, Spinal mobilization, Cryotherapy, Moist heat, Compression bandaging, scar mobilization, Splintting, Taping, Traction, Ultrasound, Ionotophoresis 4mg81m Dexamethasone, and Manual therapy    PLAN FOR NEXT SESSION: Goal of walking 30 ft.  continue to work on strengthening, bed mobility, gait and transfer training, standing balance  and increasing overall functional mobility and decreasing fall risk.  Follow up on AFO versus possible ankle brace??   1:48 PM, 10/30/21 Azariel Banik Small Jazper Nikolai MPT [facility] [place] #[phone]

## 2021-11-03 ENCOUNTER — Encounter (HOSPITAL_COMMUNITY): Payer: Self-pay

## 2021-11-03 ENCOUNTER — Encounter (HOSPITAL_COMMUNITY): Payer: Self-pay | Admitting: Occupational Therapy

## 2021-11-03 ENCOUNTER — Ambulatory Visit (HOSPITAL_COMMUNITY): Payer: 59 | Admitting: Occupational Therapy

## 2021-11-03 ENCOUNTER — Ambulatory Visit (HOSPITAL_COMMUNITY): Payer: 59

## 2021-11-03 DIAGNOSIS — R29818 Other symptoms and signs involving the nervous system: Secondary | ICD-10-CM

## 2021-11-03 DIAGNOSIS — R2689 Other abnormalities of gait and mobility: Secondary | ICD-10-CM

## 2021-11-03 DIAGNOSIS — R29898 Other symptoms and signs involving the musculoskeletal system: Secondary | ICD-10-CM

## 2021-11-03 DIAGNOSIS — M6281 Muscle weakness (generalized): Secondary | ICD-10-CM

## 2021-11-03 DIAGNOSIS — R278 Other lack of coordination: Secondary | ICD-10-CM | POA: Diagnosis not present

## 2021-11-03 NOTE — Therapy (Signed)
OUTPATIENT PHYSICAL THERAPY PROGRESS  NOTE   Progress Note Reporting Period 07/13/2021 to 10/30/2021  See note below for Objective Data and Assessment of Progress/Goals.      [name] MRN: [id number] DOB:08/09/1965, 56 y.o., female Today's Date: 11/03/2021  PCP: Neale Burly, MD REFERRING PROVIDER: Curly Rim, MD     See note below for Objective Data and Assessment of Progress/Goals  END OF SESSION:   PT End of Session - 11/03/21 1414     Visit Number 19    Number of Visits 23    Date for PT Re-Evaluation 10/27/21    Authorization Type [employer] (60 VL 10 used, 5 remain)    Authorization - Visit Number 67    Authorization - Number of Visits 59    Progress Note Due on Visit 23    PT Start Time 0148    PT Stop Time 0230    PT Time Calculation (min) 42 min    Equipment Utilized During Treatment Gait belt    Activity Tolerance Patient tolerated treatment well;Patient limited by fatigue    Behavior During Therapy WFL for tasks assessed/performed                  Past Medical History:  Diagnosis Date   Acute heart failure ([place])    ADD (attention deficit disorder)    Bronchitis    Depression    Diabetes mellitus without complication ([place])    Elevated troponin    Hypertension    Obesity    Past Surgical History:  Procedure Laterality Date   CESAREAN SECTION     Patient Active Problem List   Diagnosis Date Noted   Chronic combined systolic and diastolic congestive heart failure ([place]) 08/25/2020   Acute CVA (cerebrovascular accident) ([place]) 08/24/2020   Borderline prolonged QT interval 08/24/2020   Type 2 diabetes mellitus with complication, without long-term current use of insulin ([place]) 03/16/2020   Anasarca    Diabetes mellitus ([place])    Hypokalemia    Vitamin D deficiency    Hypothyroidism    Physical deconditioning    Acute on chronic systolic CHF (congestive heart failure) ([place]) 03/09/2020   Acute on chronic  systolic HF (heart failure) ([place]) 03/09/2020   Hyperglycemia 12/22/2018   Hypertensive crisis 04/04/2018   Acute CHF (congestive heart failure) ([place]) 04/04/2018   Mild renal insufficiency 04/04/2018   Elevated troponin 04/04/2018   Bronchitis    Obesity    Seborrheic dermatitis of scalp 05/23/2016   Essential hypertension 12/02/2015   Depression 12/02/2015   Primary insomnia 12/02/2015   Morbid obesity with body mass index of 50.0-59.9 in adult ([place]) 12/02/2015   Attention deficit hyperactivity disorder (ADHD), predominantly inattentive type 12/02/2015    REFERRING DIAG: Z86.73 (ICD-10-CM) - Personal history of transient ischemic attack (TIA), and cerebral infarction without residual deficits     THERAPY DIAG:  Other lack of coordination  Other symptoms and signs involving the musculoskeletal system  Other symptoms and signs involving the nervous system  Other abnormalities of gait and mobility  Muscle weakness (generalized)  PERTINENT HISTORY:  Essential hypertension;  Hypothyroidism due to Hashimoto's thyroiditis;  CVD (cerebrovascular disease);  History of CVA (cerebrovascular accident);  Type 2 diabetes mellitus with hyperglycemia   PRECAUTIONS: fall   SUBJECTIVE: Patient states she is doing about the same.patient states she has been sitting in chair for awhile as transport picked her up early. States her back is hurting sitting for so long.  PAIN:  Are you having pain? Yes,  3/10 initially  OBJECTIVE:        TODAY'S TREATMENT:  11/03/21 Amb with hemi cane 12'x2 with seated rest between Pull to stand x10 Sit to stand no UE support at CG/mina Standing march x8 reps Seated physioball forward rolls x10 Left UE reach to right calf x5   10/30/21 Progress note WC to mat table squat pivot transfer with CGA Sit on EOB with 1 UE support x 5 min Sit to supine min A for legs Supine to sit mod A to brink trunk upright Roll on mat supine to prone modified  independent Sit to stand with hemi walker from mat table x 3 CGA   Bridge x 10  Ambulation with hemi-walker and CGA with WC following x 4 ft; then rest and again x 2 steps with hemi walker and CGA; noted Right ankle instability/ foot drop         10/25/19:  Patient present with mom and manual WC and presenting with R LE clonus of concern to patient.    - Sitting in [facility] for discussion on clonus and practice on deep pressure from L UE into R knee downward x 30 sec x 2 or through weight shift into R LE    - Sitting hamstring/nerve stretch with belt x 30 sec x 3 reps   - Discussion and HEP review    - LAQ x 10 reps    - Heel/toe raises x 10 reps    - sitting marching x 10 reps    - sit to stand x 10 reps   Transfer to right through lateral step with minA x 4 steps onto plinth   - Lateral scoot trial x 4 to R, maxA to sidelying R to roll to back    - Cont HEP review in bed with bed mobility handouts    - Supine Bridge x 10     - Supine marching x 10 reps   Return to sitting EOB with modA   - Transfer sit to stand with hemi walker and CGA   - Transfer side step to left x 4 with minA and then down into [facility]   10/06/19 Stand:  Advancing RT leg x 3 3 reps              Lifting RT leg off the floor x 3 3 reps               Wt shifting x 5               Gt with hemi walker  1 time 20" second time 23 ft  Sit:      Sit to stand x 15             Resisted LAQ x 10             Resisted hip abduction x 10/adduction x 10               Lean back then pull forward to work abdominals x 10             Paloff one hand x 10   both sides             PNF 1 and 2 x 10 with blue theraband. (Hand to Rt knee; Hand to Rt shoulder )  09/27/21 Gait training Rt UE platform walker: 3 sets 1'43" 654f; 2'23" 259f 57" [id number] to LE fatigue and trimmers Pt able to stand with Lt UE pushing from chair 5x Bed mobility MMT Reviewed goals  09/15/21 Pull to stand at // bar 2x5 Ambulation  using Rt UE platform walker, 10', 20', 10' walks with rest between Leg press left leg 3 plates x20, right leg 2 plates x20 with cues to control knee hyperextension  09/13/21 Pull to stand at // bar 2x10 Ambulation using Rt UE platform walker,  3', attempted second stand Nustep x5 mins, b/l LE, uni R UE Seated quad extensions at cc with 20# x20                   Italics below from Evaluation on 07/13/2021 DIAGNOSTIC FINDINGS: MRI 08/24/20 IMPRESSION: Acute lacunar type infarct involving the posterior limb of the left internal capsule. Additional small subacute lacunar type infarct involving the left periatrial white matter.             MMT Right 07/13/2021 Left 07/13/2021 Right 08/24/2021 Left 08/24/2021 Right 09/27/21 Left  09/27/21 Right 10/30/21 Left 10/30/21  Hip flexion 3-/5 5/5 3- 5 Supine: 3+/5 Supine:  5/5 Sitting 3- 5  Hip extension  2-   2+ 4- Prone: 2+ Prone 4-/5    Hip abduction  3-     Sidelying:  Flexed hip 3/5  Sidelying: Compensates with flexed hip 4+/5 Sidelying Flexed hip 3+ 4+  Hip adduction            Hip internal rotation            Hip external rotation            Knee flexion 2/5tested prone 5/5 2+ 5 Prone: 2+ Prone: 5/5 Prone 3- Prone 5  Knee extension 4-/5 5/5 4 5  4/5 5/5 4+ 5  Ankle dorsiflexion 3/5 5/5 2- 5 2/5 5/5 2 5   Sit to stand and transfer with mod assist with pt having a posterior lean. Sit to supine with mod assist Supine to side lying to prone with min assist   TX:  sit to stand x 5;                 Sitting:  LAQ x 10 with manual resistance                 Side lying hip abduction x 5                Supine:  bridge x 10                        SLR x 10               Isometric ab/adduction x 10                        Clam x 10                       Dead bug with therapist assist of RT UE x 10                 Prone:  Knee flexion, SLR x 10@    07/13/2021  LE ROM:    WFL for tasks assessed    MMT:     MMT Right 07/13/2021  Left 07/13/2021  Hip flexion 3-/5 5/5  Hip extension  Hip abduction      Hip adduction      Hip internal rotation      Hip external rotation      Knee flexion 3+/5 5/5  Knee extension 4-/5 5/5  Ankle dorsiflexion 3/5 5/5   Ankle plantarflexion      Ankle inversion      Ankle eversion      (Blank rows = not tested)     TRANSFERS: Assistive device utilized: Hemi walker and Wheelchair (manual)  Sit to stand: Mod A Stand to sit: Mod A       GAIT: Gait pattern: ataxic Distance walked: 3 feet Assistive device utilized:  hemiwalker Level of assistance:  min/mod assist Comments: unsteady   PATIENT EDUCATION:  Education details: 08/24/2021 progress and plan of care. Patient educated on exam findings, POC, scope of PT, HEP, and increasing activity. 08/04/21 = education on nerve tension, nerve and muscle lengthening, clonus reaction and pressure to stop; and mostly focused on education of increased movement for unweighting "wiggle throughout day" 10/24/21: HEP review, clonus, increased WB after Person educated: Patient Education method: Explanation, Demonstration, and Handouts Education comprehension: verbalized understanding, returned demonstration, verbal cues required, and tactile cues required      HOME EXERCISE PROGRAM: EValuation:Marching 2x 10                     LAQ 2x 10 5 Second                    Heel/toe raises 2 x 10 5 second hold              07/21/2021  Side lying hip abduction x 5                Supine:  bridge x 10                                SLR x 10        4/11: sidelying forearm push up exercises  7/11: Reviewed patient her HEP currently sit to stand at sink, walking with parents help and prior HEP All these prior given with new handouts below   Access Code: [email or web address] URL: [email or web address] Date: 10/24/2021 Prepared by: Jerilynn Som  Exercises - Seated Heel Toe Raises  - 2 x daily - 7 x weekly - 2 sets - 10 reps - Seated Long Arc  Quad  - 2 x daily - 7 x weekly - 2 sets - 10 reps - Seated March  - 2 x daily - 7 x weekly - 2 sets - 10 reps - Seated Hamstring Stretch with Strap  - 1 x daily - 7 x weekly - 1 sets - 2 reps - 30 seconds hold - Supine Bridge  - 1 x daily - 7 x weekly - 3 sets - 10 reps - Supine March  - 1 x daily - 7 x weekly - 3 sets - 10 reps - Sit to Stand with Counter Support  - 1 x daily - 7 x weekly - 2 sets - 10 reps  Patient Education - Rolling From Your Back to Your Side - Bed Mobility     GOALS: Goals reviewed with patient? Yes   SHORT TERM GOALS: Target date: 10/14/2021   Patient will be independent with HEP in order to improve functional outcomes. Baseline: 09/27/21:  Reports she has been pulling up and standing every  hour, stands for one minute.  Pt unsure with current HEP program. Goal status: ongoing    2.  Patient will report at least 25% improvement in symptoms for improved quality of life. Baseline:  09/27/21: Reports improvements by 35%, improvements with mobility, gait, and strength. Goal status:met     LONG TERM GOALS: Target date: 10/27/2021   Patient will report at least 75% improvement in symptoms for improved quality of life. Baseline:  09/27/21: Reports improvements by 35%, improvements with mobility, gait, and strength. Goal status: ongoing    2.  Patient will be able to perform sit to stand without UE use in order to demonstrate improved functional strength. Baseline: 10/30/21 can stand up with left upper extremity assist with CGA. 09/27/21: Able to stand with Lt UE pushing from chair with CGA, increased fatigue requires min A  08/24/21 needs min to mod A today due to increased back pain Goal status: ongoing    3.  Patient will be able to stand/walk for 5 minutes with LRAD use for balance in order to complete household tasks. Baseline: 6/14: Gait training Rt UE platform walker: 3 sets 1'43" 84f; 2'23" 2107f 57" 36f44f/11/23 walking 5' and then 10' ft at recent session.  30  sec to 1 min at a time.  Goal status: ongoing        ASSESSMENT:   CLINICAL IMPRESSION: Patient has not been seen by this therapist in a few weeks. Pt continues to be greatly limited by back pain. Every step of walking with moans of pain, requires constant verbal cueing for sequence of cane and leg advancement. Patient able to stand without UE support but persistently says she is unable to complete it after each rep. Pt very fearful of performing seated reaches towards feet.  In a lot of exercises pt is more self limiting because of fear than actual capabilities. Patient will benefit from continued skilled therapy interventions to address deficits and improve functional mobility.   OBJECTIVE IMPAIRMENTS Abnormal gait, decreased activity tolerance, decreased balance, decreased endurance, decreased knowledge of use of DME, decreased mobility, difficulty walking, decreased ROM, decreased strength, impaired flexibility, impaired UE functional use, improper body mechanics, and obesity.    ACTIVITY LIMITATIONS cleaning, community activity, driving, meal prep, occupation, laundry, yard work, shopping, and yard work.    PERSONAL FACTORS Behavior pattern, Fitness, Past/current experiences, Time since onset of injury/illness/exacerbation, and 3+ comorbidities: CVA, increased BMI, sedentary   are also affecting patient's functional outcome.      REHAB POTENTIAL: Good   CLINICAL DECISION MAKING: Evolving/moderate complexity   EVALUATION COMPLEXITY: Moderate   PLAN: PT FREQUENCY: 2x/week   PT DURATION: 4 weeks   PLANNED INTERVENTIONS: Therapeutic exercises, Therapeutic activity, Neuromuscular re-education, Balance training, Gait training, Patient/Family education, Joint manipulation, Joint mobilization, Stair training, Orthotic/Fit training, DME instructions, Aquatic Therapy, Dry Needling, Electrical stimulation, Spinal manipulation, Spinal mobilization, Cryotherapy, Moist heat, Compression  bandaging, scar mobilization, Splintting, Taping, Traction, Ultrasound, Ionotophoresis 4mg68m Dexamethasone, and Manual therapy    PLAN FOR NEXT SESSION: Goal of walking 30 ft.  continue to work on strengthening, bed mobility, gait and transfer training, standing balance and increasing overall functional mobility and decreasing fall risk.  Follow up on AFO versus possible ankle brace??   2:52 PM, 11/03/21 Faustine Tates PT, DPT

## 2021-11-03 NOTE — Therapy (Signed)
OUTPATIENT OCCUPATIONAL THERAPY RE- EVALUATION   [name] MRN: [id number] DOB:[birth year], 56 y.o., female Today's Date: 11/03/2021  PCP: Toma Deiters, MD REFERRING PROVIDER: Delano Metz, MD    END OF SESSION:   OT End of Session - 11/03/21 1504     Visit Number 13    Number of Visits 24    Date for OT Re-Evaluation 12/03/21    Authorization Type 1) [employer] 2) [place] medicaid [employer]    Authorization Time Period 1) no copay 60 visit limit  2) No auth required for patients over 21 at this time. Adults - 27 visits per calendar year OT/PT/SLP    Authorization - Visit Number 13    Authorization - Number of Visits 60    OT Start Time 1430    OT Stop Time 1513    OT Time Calculation (min) 43 min    Activity Tolerance Patient tolerated treatment well    Behavior During Therapy WFL for tasks assessed/performed                       Past Medical History:  Diagnosis Date   Acute heart failure (HCC)    ADD (attention deficit disorder)    Bronchitis    Depression    Diabetes mellitus without complication (HCC)    Elevated troponin    Hypertension    Obesity    Past Surgical History:  Procedure Laterality Date   CESAREAN SECTION     Patient Active Problem List   Diagnosis Date Noted   Chronic combined systolic and diastolic congestive heart failure (HCC) 08/25/2020   Acute CVA (cerebrovascular accident) (HCC) 08/24/2020   Borderline prolonged QT interval 08/24/2020   Type 2 diabetes mellitus with complication, without long-term current use of insulin (HCC) 03/16/2020   Anasarca    Diabetes mellitus (HCC)    Hypokalemia    Vitamin D deficiency    Hypothyroidism    Physical deconditioning    Acute on chronic systolic CHF (congestive heart failure) (HCC) 03/09/2020   Acute on chronic systolic HF (heart failure) (HCC) 03/09/2020   Hyperglycemia 12/22/2018   Hypertensive crisis 04/04/2018   Acute CHF (congestive heart failure) (HCC)  04/04/2018   Mild renal insufficiency 04/04/2018   Elevated troponin 04/04/2018   Bronchitis    Obesity    Seborrheic dermatitis of scalp 05/23/2016   Essential hypertension 12/02/2015   Depression 12/02/2015   Primary insomnia 12/02/2015   Morbid obesity with body mass index of 50.0-59.9 in adult [facility]) 12/02/2015   Attention deficit hyperactivity disorder (ADHD), predominantly inattentive type 12/02/2015    ONSET DATE: 08/23/20  REFERRING DIAG: Left CVA with right side weakness  THERAPY DIAG:  Other lack of coordination  Other symptoms and signs involving the musculoskeletal system  Other symptoms and signs involving the nervous system  Rationale for Evaluation and Treatment Rehabilitation   PERTINENT HISTORY: Patient sustained an acute lacunar infarct on the left side causing right side weakness. Patient reports that she received therapy services at [facility] for 2.5 weeks, and HH OT and PT for 1 month. Does not know the date when she sees Dr. Salvadore Farber for a follow-up. She is currently using a borrowed manual wheelchair from [facility], [place] she is unaware when she needs to return it.   PRECAUTIONS: Fall and Other: right side hemiparesis  SUBJECTIVE: S: I have been standing once or twice a week   PAIN:  Are you having pain? No  OBJECTIVE:  UE ROM      Active ROM - seated. IR/er adducted Right 08/08/2021 Right 09/18/2021 Right  7/21  Shoulder flexion 0 0 0  Shoulder abduction 50 50 48  Shoulder internal rotation 60 60 60  Shoulder external rotation 0 - UE positioning assist 20 -UE positioning assist 15  Elbow flexion 136 140 126  Elbow extension -21 -4 -5  Wrist flexion  - gravity eliminated 66 66 70  Wrist extension - gravity eliminated 0 0 0  Wrist ulnar deviation unable unable Unable   Wrist radial deviation unable unable Unable   Wrist pronation unable unable Unable   Wrist supination 0 unable Unable   (Blank rows = not tested)     UE MMT:       MMT - seated. IR/er adducted Right 08/08/2021 Right  09/18/2021 Right  7/21  Shoulder flexion 3-/5 3-/5 3/5  Shoulder abduction 3-/5 3-/5 3/5  Shoulder internal rotation 3/5 3/5 3/5  Shoulder external rotation 3-/5 3-/5 3-/5  Elbow flexion 3/5 3/5 3/5  Elbow extension 3-/5 3/5 3/5  Wrist flexion 3/5 4-/5 4-/5  Wrist extension 3-/5 3-/5 3-/5  Wrist ulnar deviation 0/5 0/5 0/5  Wrist radial deviation 0/5 0/5 0/5  Wrist pronation 3-/5 3-/5 3-/5  Wrist supination 3-/5 3-/5 3-/5  (Blank rows = not tested)   HAND FUNCTION: Grip strength: Right: 6 lbs (6 lbs previous); Left: 58 lbs and Lateral pinch: Right: 10 lbs (5 lbs previous), Left: 12 lbs   COORDINATION: Unable to test 9 hole peg test  GOALS:     SHORT TERM GOALS: Target date: 09/19/2021   Patient/caregiver will be educated on HEP in order to facilitate her progress in therapy and increase her functional performance when completing ADL tasks and move towards more functional use of her RUE.  Baseline: Goal status: on-going   2.  Patient will complete bathing and dressing tasks at moderate assist level while seated and using a hemi dressing technique.  Baseline:  Goal status: on-going   3.  Patient will increase her RUE A/ROM by 10 degrees where able in order to increase her ability to assist and complete basic ADL tasks.  Baseline:  Goal status:on-going   4.  Patient will demonstrate an increase her right hand coordination in order to complete the 9 hole peg test with assist.  Baseline:  Goal status:on-going   5.  Patient will increase her hand hand grip and pinch strength by 2-3lbs in order to progress towards using her right hand to stabilize items when eating, grooming, etc. Baseline:  Goal status: on-going     LONG TERM GOALS: Target date: 10/31/2021   Patient will complete bathing,dressing, and grooming tasks at min assist level while seated and using a hemi dressing technique.  Baseline:  Goal  status:on-going   2.  Patient will be able to demonstrate a tub/shower transfer at min assist using a tub bench in order to return to taking shower with caregiver assisting. Baseline:  Goal status: on-going   3.  Patient will increase her RUE A/ROM in order to use her right arm/hand as an active assist when completing dressing, bathing, and grooming tasks.  Baseline:  Goal status: on-going   4.  Patient will demonstrate an  increase her right hand coordination by completing the 9 hole peg test without assistance. Baseline:  Goal status: on-going   5.  Patient will increase her right hand grip and pinch strength to 10% of average norms for age  in order to progress her ability to use her hand as an active assist.  Baseline:  Goal status: on-going   PATIENT EDUCATION: Education details: discussed HEP completion Person educated: patient, mother Education method: explanation Education comprehension: verbalized understanding     HOME EXERCISE PROGRAM: 5/18: AA/ROM - shoulder supine and seated (table), elbow AA/ROM at table.  TODAY'S TREATMENT:  11/03/21  - ROM measurements taken for re evaluation  - Weight bearing: standing at table leaning into RUE, using LUE to place ping pong balls into egg carton  - AA/ROM: seated at table with RUE secured to PVC pipe with mod assist to support RUE during shoulder flexion 10 reps and shoulder protraction 10 reps, rest breaks given   10/27/21  - Weight bearing: standing at table bearing weight through RUE for 1 minute, weight bearing activity in standing- using left hand to pick up pegs and lean into right side to place on peg board 10 pegs 2x - AA/ROM- seated at table with RUE secured to PVC pipe with mod assist to support RUE during shoulder flexion 10 reps and shoulder protraction 10 reps, rest breaks given - Table slides: sitting at table with UE on towel, shoulder abduction and shoulder flexion 10x each direction, increased effort required  this session  - functional reaching: seated at table with RUE on towel reaching to slide cones in arc shape, completed 2 x  - NMES:  targeting wrist extensors, Russian, 16 mA CC, 10'- OT providing mod assist for first 5 minutes to achieve full wrist extension- unable to tolerate NMES on higher setting to initiate wrist extension       10/06/21  - Towel slides: seated at table with hand on towel, shoulder flexion, horizontal abduction 10x each  - Weight bearing- standing with OT 1 min. Completed sit to stand transfer independently, CGA while standing - Weight bearing with functional reaching activity- standing at table using L hand to pick up coins and place in container on R side of body - functional reach seated at table: reaching for colored cones, OT calling out which color to reach ( shoulder flexion) - A/AROM: sitting at table, hand secured to PVC pipe- completing shoulder flexion and protraction with min assist to support R UE (5 reps of protraction completed independently before requiring min assist)  - NMES: targeting wrist extensors, Russian, 39 mA CC, 10'- OT providing mod assist for last 5 minutes to achieve full wrist extension           CLINICAL IMPRESSION: A: Pt. Participated well in session. Completed re evaluation this session. She demonstrated increased independence with AA/ROM with less assistance. Completed weight bearing activity to target increased tone in UE. Has made minimal progress in some areas, and has maintain prior function in areas including grip strength. Pt will benefit from continued skilled therapy to increase independence with ADLs and function.    PLAN: OT FREQUENCY: 2x/week   OT DURATION: 12 weeks   PLANNED INTERVENTIONS: self care/ADL training, therapeutic exercise, therapeutic activity, neuromuscular re-education, manual therapy, passive range of motion, balance training, splinting, electrical stimulation, ultrasound, moist heat, cryotherapy,  patient/family education, visual/perceptual remediation/compensation, psychosocial skills training, energy conservation, coping strategies training, and DME and/or AE instructions   RECOMMENDED OTHER SERVICES: Manual wheelchair evaluation from mobility specialist   CONSULTED AND AGREED WITH PLAN OF CARE: Patient and family member/caregiver   PLAN FOR NEXT SESSION: P: standing weight bearing activity, reaching activity, AA/ROM,  NMES for wrist extensors. Discuss progress plan for 4  more weeks of visits with goal of d/c with HEP     Lurena Joiner, OTR/L  [phone] 11/03/2021, 3:34 PM

## 2021-11-06 ENCOUNTER — Encounter (HOSPITAL_COMMUNITY): Payer: 59 | Admitting: Occupational Therapy

## 2021-11-07 ENCOUNTER — Encounter (HOSPITAL_COMMUNITY): Payer: Self-pay | Admitting: Occupational Therapy

## 2021-11-07 ENCOUNTER — Ambulatory Visit (HOSPITAL_COMMUNITY): Payer: 59 | Admitting: Occupational Therapy

## 2021-11-07 ENCOUNTER — Ambulatory Visit (HOSPITAL_COMMUNITY): Payer: 59

## 2021-11-07 ENCOUNTER — Encounter (HOSPITAL_COMMUNITY): Payer: Self-pay

## 2021-11-07 DIAGNOSIS — R278 Other lack of coordination: Secondary | ICD-10-CM

## 2021-11-07 DIAGNOSIS — R29818 Other symptoms and signs involving the nervous system: Secondary | ICD-10-CM

## 2021-11-07 DIAGNOSIS — R2689 Other abnormalities of gait and mobility: Secondary | ICD-10-CM

## 2021-11-07 DIAGNOSIS — R29898 Other symptoms and signs involving the musculoskeletal system: Secondary | ICD-10-CM

## 2021-11-07 DIAGNOSIS — M6281 Muscle weakness (generalized): Secondary | ICD-10-CM

## 2021-11-07 NOTE — Therapy (Signed)
OUTPATIENT PHYSICAL THERAPY TREATMENT   Patient Name: Sarinah Doetsch MRN: [id number] DOB:Sep 10, 1965, 56 y.o., female Today's Date: 11/07/2021  PCP: Neale Burly, MD REFERRING PROVIDER: Curly Rim, MD     See note below for Objective Data and Assessment of Progress/Goals  END OF SESSION:   PT End of Session - 11/07/21 1427     Visit Number 20    Number of Visits 23    Date for PT Re-Evaluation 11/13/21    Authorization Type [employer] (60 VL 10 used, 27 remain)    Authorization - Visit Number 51    Authorization - Number of Visits 59    Progress Note Due on Visit 23    PT Start Time 1353    PT Stop Time 1422   request to leave early due to [place]   PT Time Calculation (min) 29 min    Equipment Utilized During Treatment Gait belt    Activity Tolerance Patient tolerated treatment well;Patient limited by fatigue   WC behind, HM with gait   Behavior During Therapy WFL for tasks assessed/performed                   Past Medical History:  Diagnosis Date   Acute heart failure ([place])    ADD (attention deficit disorder)    Bronchitis    Depression    Diabetes mellitus without complication ([place])    Elevated troponin    Hypertension    Obesity    Past Surgical History:  Procedure Laterality Date   CESAREAN SECTION     Patient Active Problem List   Diagnosis Date Noted   Chronic combined systolic and diastolic congestive heart failure ([place]) 08/25/2020   Acute CVA (cerebrovascular accident) ([place]) 08/24/2020   Borderline prolonged QT interval 08/24/2020   Type 2 diabetes mellitus with complication, without long-term current use of insulin ([place]) 03/16/2020   Anasarca    Diabetes mellitus ([place])    Hypokalemia    Vitamin D deficiency    Hypothyroidism    Physical deconditioning    Acute on chronic systolic CHF (congestive heart failure) ([place]) 03/09/2020   Acute on chronic systolic HF (heart failure) ([place]) 03/09/2020   Hyperglycemia 12/22/2018    Hypertensive crisis 04/04/2018   Acute CHF (congestive heart failure) ([place]) 04/04/2018   Mild renal insufficiency 04/04/2018   Elevated troponin 04/04/2018   Bronchitis    Obesity    Seborrheic dermatitis of scalp 05/23/2016   Essential hypertension 12/02/2015   Depression 12/02/2015   Primary insomnia 12/02/2015   Morbid obesity with body mass index of 50.0-59.9 in adult ([place]) 12/02/2015   Attention deficit hyperactivity disorder (ADHD), predominantly inattentive type 12/02/2015    REFERRING DIAG: Z86.73 (ICD-10-CM) - Personal history of transient ischemic attack (TIA), and cerebral infarction without residual deficits     THERAPY DIAG:  Other symptoms and signs involving the musculoskeletal system  Other symptoms and signs involving the nervous system  Other abnormalities of gait and mobility  Muscle weakness (generalized)  PERTINENT HISTORY:  Essential hypertension;  Hypothyroidism due to Hashimoto's thyroiditis;  CVD (cerebrovascular disease);  History of CVA (cerebrovascular accident);  Type 2 diabetes mellitus with hyperglycemia   PRECAUTIONS: fall   SUBJECTIVE: Pt stated she has been walking some every day with walker.  No reports of recent falls.  No reports of pain.   PAIN:  Are you having pain? No  OBJECTIVE:        TODAY'S TREATMENT:  11/07/21 Amb with hemi cane 12'x2  with seated rest between STS with Lt UE support (unable to do wihtout UE support due to fatigue following OT). Standing marching 5 reps then requested to leave early due to fatigue.  11/03/21 Amb with hemi cane 12'x2 with seated rest between Pull to stand x10 Sit to stand no UE support at CG/mina Standing march x8 reps Seated physioball forward rolls x10 Left UE reach to right calf x5   10/30/21 Progress note WC to mat table squat pivot transfer with CGA Sit on EOB with 1 UE support x 5 min Sit to supine min A for legs Supine to sit mod A to brink trunk upright Roll on mat supine  to prone modified independent Sit to stand with hemi walker from mat table x 3 CGA   Bridge x 10  Ambulation with hemi-walker and CGA with WC following x 4 ft; then rest and again x 2 steps with hemi walker and CGA; noted Right ankle instability/ foot drop         10/25/19:  Patient present with mom and manual WC and presenting with R LE clonus of concern to patient.    - Sitting in [facility] for discussion on clonus and practice on deep pressure from L UE into R knee downward x 30 sec x 2 or through weight shift into R LE    - Sitting hamstring/nerve stretch with belt x 30 sec x 3 reps   - Discussion and HEP review    - LAQ x 10 reps    - Heel/toe raises x 10 reps    - sitting marching x 10 reps    - sit to stand x 10 reps   Transfer to right through lateral step with minA x 4 steps onto plinth   - Lateral scoot trial x 4 to R, maxA to sidelying R to roll to back    - Cont HEP review in bed with bed mobility handouts    - Supine Bridge x 10     - Supine marching x 10 reps   Return to sitting EOB with modA   - Transfer sit to stand with hemi walker and CGA   - Transfer side step to left x 4 with minA and then down into [facility]   10/06/19 Stand:  Advancing RT leg x 3 3 reps              Lifting RT leg off the floor x 3 3 reps               Wt shifting x 5               Gt with hemi walker  1 time 20" second time 23 ft  Sit:      Sit to stand x 15             Resisted LAQ x 10             Resisted hip abduction x 10/adduction x 10               Lean back then pull forward to work abdominals x 10             Paloff one hand x 10   both sides             PNF 1 and 2 x 10 with blue theraband. (Hand to Rt knee; Hand to Rt shoulder )  09/27/21 Gait training Rt UE platform walker: 3 sets 1'43" 48f; 2'23" 273f 57" [id number] to LE fatigue and trimmers Pt able to stand with Lt UE pushing from chair 5x Bed mobility MMT Reviewed goals  09/15/21 Pull to stand at //  bar 2x5 Ambulation using Rt UE platform walker, 10', 20', 10' walks with rest between Leg press left leg 3 plates x20, right leg 2 plates x20 with cues to control knee hyperextension  09/13/21 Pull to stand at // bar 2x10 Ambulation using Rt UE platform walker,  3', attempted second stand Nustep x5 mins, b/l LE, uni R UE Seated quad extensions at cc with 20# x20                   Italics below from Evaluation on 07/13/2021 DIAGNOSTIC FINDINGS: MRI 08/24/20 IMPRESSION: Acute lacunar type infarct involving the posterior limb of the left internal capsule. Additional small subacute lacunar type infarct involving the left periatrial white matter.             MMT Right 07/13/2021 Left 07/13/2021 Right 08/24/2021 Left 08/24/2021 Right 09/27/21 Left  09/27/21 Right 10/30/21 Left 10/30/21  Hip flexion 3-/5 5/5 3- 5 Supine: 3+/5 Supine:  5/5 Sitting 3- 5  Hip extension  2-   2+ 4- Prone: 2+ Prone 4-/5    Hip abduction  3-     Sidelying:  Flexed hip 3/5  Sidelying: Compensates with flexed hip 4+/5 Sidelying Flexed hip 3+ 4+  Hip adduction            Hip internal rotation            Hip external rotation            Knee flexion 2/5tested prone 5/5 2+ 5 Prone: 2+ Prone: 5/5 Prone 3- Prone 5  Knee extension 4-/5 5/5 4 5  4/5 5/5 4+ 5  Ankle dorsiflexion 3/5 5/5 2- 5 2/5 5/5 2 5   Sit to stand and transfer with mod assist with pt having a posterior lean. Sit to supine with mod assist Supine to side lying to prone with min assist   TX:  sit to stand x 5;                 Sitting:  LAQ x 10 with manual resistance                 Side lying hip abduction x 5                Supine:  bridge x 10                        SLR x 10               Isometric ab/adduction x 10                        Clam x 10                       Dead bug with therapist assist of RT UE x 10                 Prone:  Knee flexion, SLR x 10@    07/13/2021  LE ROM:    WFL for tasks assessed    MMT:     MMT  Right 07/13/2021 Left 07/13/2021  Hip flexion 3-/5 5/5  Hip extension  Hip abduction      Hip adduction      Hip internal rotation      Hip external rotation      Knee flexion 3+/5 5/5  Knee extension 4-/5 5/5  Ankle dorsiflexion 3/5 5/5   Ankle plantarflexion      Ankle inversion      Ankle eversion      (Blank rows = not tested)     TRANSFERS: Assistive device utilized: Hemi walker and Wheelchair (manual)  Sit to stand: Mod A Stand to sit: Mod A       GAIT: Gait pattern: ataxic Distance walked: 3 feet Assistive device utilized:  hemiwalker Level of assistance:  min/mod assist Comments: unsteady   PATIENT EDUCATION:  Education details: 08/24/2021 progress and plan of care. Patient educated on exam findings, POC, scope of PT, HEP, and increasing activity. 08/04/21 = education on nerve tension, nerve and muscle lengthening, clonus reaction and pressure to stop; and mostly focused on education of increased movement for unweighting "wiggle throughout day" 10/24/21: HEP review, clonus, increased WB after Person educated: Patient Education method: Explanation, Demonstration, and Handouts Education comprehension: verbalized understanding, returned demonstration, verbal cues required, and tactile cues required      HOME EXERCISE PROGRAM: EValuation:Marching 2x 10                     LAQ 2x 10 5 Second                    Heel/toe raises 2 x 10 5 second hold              07/21/2021  Side lying hip abduction x 5                Supine:  bridge x 10                                SLR x 10        4/11: sidelying forearm push up exercises  7/11: Reviewed patient her HEP currently sit to stand at sink, walking with parents help and prior HEP All these prior given with new handouts below   Access Code: [email or web address] URL: [email or web address] Date: 10/24/2021 Prepared by: Jerilynn Som  Exercises - Seated Heel Toe Raises  - 2 x daily - 7 x weekly - 2 sets - 10 reps -  Seated Long Arc Quad  - 2 x daily - 7 x weekly - 2 sets - 10 reps - Seated March  - 2 x daily - 7 x weekly - 2 sets - 10 reps - Seated Hamstring Stretch with Strap  - 1 x daily - 7 x weekly - 1 sets - 2 reps - 30 seconds hold - Supine Bridge  - 1 x daily - 7 x weekly - 3 sets - 10 reps - Supine March  - 1 x daily - 7 x weekly - 3 sets - 10 reps - Sit to Stand with Counter Support  - 1 x daily - 7 x weekly - 2 sets - 10 reps  Patient Education - Rolling From Your Back to Your Side - Bed Mobility     GOALS: Goals reviewed with patient? Yes   SHORT TERM GOALS: Target date: 10/14/2021   Patient will be independent with HEP in order to improve functional outcomes. Baseline: 09/27/21:  Reports she has been pulling up and standing every  hour, stands for one minute.  Pt unsure with current HEP program. Goal status: ongoing    2.  Patient will report at least 25% improvement in symptoms for improved quality of life. Baseline:  09/27/21: Reports improvements by 35%, improvements with mobility, gait, and strength. Goal status:met     LONG TERM GOALS: Target date: 10/27/2021   Patient will report at least 75% improvement in symptoms for improved quality of life. Baseline:  09/27/21: Reports improvements by 35%, improvements with mobility, gait, and strength. Goal status: ongoing    2.  Patient will be able to perform sit to stand without UE use in order to demonstrate improved functional strength. Baseline: 10/30/21 can stand up with left upper extremity assist with CGA. 09/27/21: Able to stand with Lt UE pushing from chair with CGA, increased fatigue requires min A  08/24/21 needs min to mod A today due to increased back pain Goal status: ongoing    3.  Patient will be able to stand/walk for 5 minutes with LRAD use for balance in order to complete household tasks. Baseline: 6/14: Gait training Rt UE platform walker: 3 sets 1'43" 38f; 2'23" 273f 57" 90f50f/11/23 walking 5' and then 10' ft at  recent session.  30 sec to 1 min at a time.  Goal status: ongoing        ASSESSMENT:   CLINICAL IMPRESSION: Pt stated unable to speak to PCP/referring MD concerning AFO, encouraged to call following apt today.  Gait training complete with hemiwalker, pt c/o Rt knee buckling required seated rest breaks.  Pt required assistance with Rt brake, encouraged to purchase brake extension with walker to increased independence for safety.  Pt requested to end session early due to fatigue.  Pt encouraged to push herself as current status not progressing much further.  Pt presents with a fear of falling and Rt LE weight bearing.   OBJECTIVE IMPAIRMENTS Abnormal gait, decreased activity tolerance, decreased balance, decreased endurance, decreased knowledge of use of DME, decreased mobility, difficulty walking, decreased ROM, decreased strength, impaired flexibility, impaired UE functional use, improper body mechanics, and obesity.    ACTIVITY LIMITATIONS cleaning, community activity, driving, meal prep, occupation, laundry, yard work, shopping, and yard work.    PERSONAL FACTORS Behavior pattern, Fitness, Past/current experiences, Time since onset of injury/illness/exacerbation, and 3+ comorbidities: CVA, increased BMI, sedentary   are also affecting patient's functional outcome.      REHAB POTENTIAL: Good   CLINICAL DECISION MAKING: Evolving/moderate complexity   EVALUATION COMPLEXITY: Moderate   PLAN: PT FREQUENCY: 2x/week   PT DURATION: 4 weeks   PLANNED INTERVENTIONS: Therapeutic exercises, Therapeutic activity, Neuromuscular re-education, Balance training, Gait training, Patient/Family education, Joint manipulation, Joint mobilization, Stair training, Orthotic/Fit training, DME instructions, Aquatic Therapy, Dry Needling, Electrical stimulation, Spinal manipulation, Spinal mobilization, Cryotherapy, Moist heat, Compression bandaging, scar mobilization, Splintting, Taping, Traction, Ultrasound,  Ionotophoresis 4mg72m Dexamethasone, and Manual therapy    PLAN FOR NEXT SESSION: Goal of walking 30 ft.  continue to work on strengthening, bed mobility, gait and transfer training, standing balance and increasing overall functional mobility and decreasing fall risk.  Follow up on AFO versus possible ankle brace??  CaseIhor AustinTA/CLT; CBIS [phone] PM, 11/07/21

## 2021-11-07 NOTE — Therapy (Signed)
OUTPATIENT OCCUPATIONAL THERAPY RE- EVALUATION   [name] MRN: [id number] DOB:1965-06-29, 56 y.o., female Today's Date: 11/07/2021  PCP: Toma Deiters, MD REFERRING PROVIDER: Delano Metz, MD    END OF SESSION:   OT End of Session - 11/07/21 1613     Visit Number 14    Number of Visits 24    Date for OT Re-Evaluation 12/03/21    Authorization Type 1) [employer] 2) [place] medicaid UHC    Authorization Time Period 1) no copay 60 visit limit  2) No auth required for patients over 21 at this time. Adults - 27 visits per calendar year OT/PT/SLP    Authorization - Visit Number 14    Authorization - Number of Visits 60    OT Start Time 1303    OT Stop Time 1351    OT Time Calculation (min) 48 min    Activity Tolerance Patient tolerated treatment well    Behavior During Therapy WFL for tasks assessed/performed                Past Medical History:  Diagnosis Date   Acute heart failure (HCC)    ADD (attention deficit disorder)    Bronchitis    Depression    Diabetes mellitus without complication (HCC)    Elevated troponin    Hypertension    Obesity    Past Surgical History:  Procedure Laterality Date   CESAREAN SECTION     Patient Active Problem List   Diagnosis Date Noted   Chronic combined systolic and diastolic congestive heart failure (HCC) 08/25/2020   Acute CVA (cerebrovascular accident) (HCC) 08/24/2020   Borderline prolonged QT interval 08/24/2020   Type 2 diabetes mellitus with complication, without long-term current use of insulin (HCC) 03/16/2020   Anasarca    Diabetes mellitus (HCC)    Hypokalemia    Vitamin D deficiency    Hypothyroidism    Physical deconditioning    Acute on chronic systolic CHF (congestive heart failure) (HCC) 03/09/2020   Acute on chronic systolic HF (heart failure) (HCC) 03/09/2020   Hyperglycemia 12/22/2018   Hypertensive crisis 04/04/2018   Acute CHF (congestive heart failure) (HCC) 04/04/2018    Mild renal insufficiency 04/04/2018   Elevated troponin 04/04/2018   Bronchitis    Obesity    Seborrheic dermatitis of scalp 05/23/2016   Essential hypertension 12/02/2015   Depression 12/02/2015   Primary insomnia 12/02/2015   Morbid obesity with body mass index of 50.0-59.9 in adult [facility]) 12/02/2015   Attention deficit hyperactivity disorder (ADHD), predominantly inattentive type 12/02/2015    ONSET DATE: 08/23/20  REFERRING DIAG: Left CVA with right side weakness  THERAPY DIAG:  Other lack of coordination  Other symptoms and signs involving the nervous system  Rationale for Evaluation and Treatment Rehabilitation   PERTINENT HISTORY: Patient sustained an acute lacunar infarct on the left side causing right side weakness. Patient reports that she received therapy services at [facility] for 2.5 weeks, and HH OT and PT for 1 month. Does not know the date when she sees Dr. Salvadore Farber for a follow-up. She is currently using a borrowed manual wheelchair from SNF [facility], [place] she is unaware when she needs to return it.   PRECAUTIONS: Fall and Other: right side hemiparesis  SUBJECTIVE: S: I'm not sleepy today!   PAIN:  Are you having pain? No     OBJECTIVE:  UE ROM      Active ROM - seated. IR/er adducted Right 08/08/2021 Right  09/18/2021 Right  7/21  Shoulder flexion 0 0 0  Shoulder abduction 50 50 48  Shoulder internal rotation 60 60 60  Shoulder external rotation 0 - UE positioning assist 20 -UE positioning assist 15  Elbow flexion 136 140 126  Elbow extension -21 -4 -5  Wrist flexion  - gravity eliminated 66 66 70  Wrist extension - gravity eliminated 0 0 0  Wrist ulnar deviation unable unable Unable   Wrist radial deviation unable unable Unable   Wrist pronation unable unable Unable   Wrist supination 0 unable Unable   (Blank rows = not tested)     UE MMT:      MMT - seated. IR/er adducted Right 08/08/2021 Right  09/18/2021 Right  7/21  Shoulder  flexion 3-/5 3-/5 3/5  Shoulder abduction 3-/5 3-/5 3/5  Shoulder internal rotation 3/5 3/5 3/5  Shoulder external rotation 3-/5 3-/5 3-/5  Elbow flexion 3/5 3/5 3/5  Elbow extension 3-/5 3/5 3/5  Wrist flexion 3/5 4-/5 4-/5  Wrist extension 3-/5 3-/5 3-/5  Wrist ulnar deviation 0/5 0/5 0/5  Wrist radial deviation 0/5 0/5 0/5  Wrist pronation 3-/5 3-/5 3-/5  Wrist supination 3-/5 3-/5 3-/5  (Blank rows = not tested)   HAND FUNCTION: Grip strength: Right: 6 lbs (6 lbs previous); Left: 58 lbs and Lateral pinch: Right: 10 lbs (5 lbs previous), Left: 12 lbs   COORDINATION: Unable to test 9 hole peg test  GOALS:     SHORT TERM GOALS: Target date: 09/19/2021   Patient/caregiver will be educated on HEP in order to facilitate her progress in therapy and increase her functional performance when completing ADL tasks and move towards more functional use of her RUE.  Baseline: Goal status: on-going   2.  Patient will complete bathing and dressing tasks at moderate assist level while seated and using a hemi dressing technique.  Baseline:  Goal status: on-going   3.  Patient will increase her RUE A/ROM by 10 degrees where able in order to increase her ability to assist and complete basic ADL tasks.  Baseline:  Goal status:on-going   4.  Patient will demonstrate an increase her right hand coordination in order to complete the 9 hole peg test with assist.  Baseline:  Goal status:on-going   5.  Patient will increase her hand hand grip and pinch strength by 2-3lbs in order to progress towards using her right hand to stabilize items when eating, grooming, etc. Baseline:  Goal status: on-going     LONG TERM GOALS: Target date: 10/31/2021   Patient will complete bathing,dressing, and grooming tasks at min assist level while seated and using a hemi dressing technique.  Baseline:  Goal status:on-going   2.  Patient will be able to demonstrate a tub/shower transfer at min assist using a tub  bench in order to return to taking shower with caregiver assisting. Baseline:  Goal status: on-going   3.  Patient will increase her RUE A/ROM in order to use her right arm/hand as an active assist when completing dressing, bathing, and grooming tasks.  Baseline:  Goal status: on-going   4.  Patient will demonstrate an  increase her right hand coordination by completing the 9 hole peg test without assistance. Baseline:  Goal status: on-going   5.  Patient will increase her right hand grip and pinch strength to 10% of average norms for age in order to progress her ability to use her hand as an active assist.  Baseline:  Goal  status: on-going   PATIENT EDUCATION: Education details: discussed HEP completion Person educated: patient, mother Education method: explanation Education comprehension: verbalized understanding     HOME EXERCISE PROGRAM: 5/18: AA/ROM - shoulder supine and seated (table), elbow AA/ROM at table.  TODAY'S TREATMENT:  11/07/21 - sit<>stand transfers x3 CGA -AAROM (RUE wrapped on PVC Pipe to assist grasp): shoulder flexion x10, chest press x10 reps, R abduction/adduction x5, R external  rotation x10 - AAROM Shoulder stabilization (RUE wrapped on PVS Pipe to assist grasp): Small circles, small back and forths, small up and downs 10 reps x3 sets -Supine<>sit bed transfer x2 mod A for BLE management on and off bed - Added exercises to HEP: AAROM seated chest presses, shoulder flexion, wrist flexion and extension - Both hands clasped together and LUE guiding/assisting RUE through each exercise.  11/03/21  - ROM measurements taken for re evaluation  - Weight bearing: standing at table leaning into RUE, using LUE to place ping pong balls into egg carton  - AA/ROM: seated at table with RUE secured to PVC pipe with mod assist to support RUE during shoulder flexion 10 reps and shoulder protraction 10 reps, rest breaks given   10/27/21  - Weight bearing: standing at  table bearing weight through RUE for 1 minute, weight bearing activity in standing- using left hand to pick up pegs and lean into right side to place on peg board 10 pegs 2x - AA/ROM- seated at table with RUE secured to PVC pipe with mod assist to support RUE during shoulder flexion 10 reps and shoulder protraction 10 reps, rest breaks given - Table slides: sitting at table with UE on towel, shoulder abduction and shoulder flexion 10x each direction, increased effort required this session  - functional reaching: seated at table with RUE on towel reaching to slide cones in arc shape, completed 2 x  - NMES:  targeting wrist extensors, Russian, 16 mA CC, 10'- OT providing mod assist for first 5 minutes to achieve full wrist extension- unable to tolerate NMES on higher setting to initiate wrist extension      CLINICAL IMPRESSION: A: Pt presented motivated this session. With CGA, OT completed face to face with pt to stand pivot transfer to and from the [facility] and bed. Pt then needed mod A for all bed mobility to go to supine and return to sitting. She participated in [facility] and stabilization of proximal shoulder with RUE wrapped with ace bandage on to the PVC pipe to assist with grasp. She continues to demonstrate fatigue with all UE exercises, requiring frequent rest breaks after each exercise. She demonstrates improvements with her LUE strength and AROM this session. OT discussed adapting HEP for AAROM of RUE by clasping hands together and completing exercises with LUE assisting. Pt agreeable with HEP and motivated to continue participating in all therapy.  PLAN: OT FREQUENCY: 2x/week   OT DURATION: 12 weeks   PLANNED INTERVENTIONS: self care/ADL training, therapeutic exercise, therapeutic activity, neuromuscular re-education, manual therapy, passive range of motion, balance training, splinting, electrical stimulation, ultrasound, moist heat, cryotherapy, patient/family education, visual/perceptual  remediation/compensation, psychosocial skills training, energy conservation, coping strategies training, and DME and/or AE instructions   RECOMMENDED OTHER SERVICES: Manual wheelchair evaluation from mobility specialist   CONSULTED AND AGREED WITH PLAN OF CARE: Patient and family member/caregiver   PLAN FOR NEXT SESSION: P: standing weight bearing activity, reaching activity, AA/ROM,  NMES for wrist extensors. Discuss progress plan for 4 more weeks of visits with goal of d/c with  HEP     Ezra Sites, OTR/L  [phone] 11/07/2021, 4:17 PM

## 2021-11-08 ENCOUNTER — Encounter (HOSPITAL_COMMUNITY): Payer: 59

## 2021-11-09 ENCOUNTER — Ambulatory Visit (HOSPITAL_COMMUNITY): Payer: 59

## 2021-11-09 ENCOUNTER — Encounter (HOSPITAL_COMMUNITY): Payer: Self-pay

## 2021-11-09 DIAGNOSIS — M6281 Muscle weakness (generalized): Secondary | ICD-10-CM

## 2021-11-09 DIAGNOSIS — R278 Other lack of coordination: Secondary | ICD-10-CM | POA: Diagnosis not present

## 2021-11-09 DIAGNOSIS — R29818 Other symptoms and signs involving the nervous system: Secondary | ICD-10-CM

## 2021-11-09 DIAGNOSIS — R29898 Other symptoms and signs involving the musculoskeletal system: Secondary | ICD-10-CM

## 2021-11-09 NOTE — Therapy (Addendum)
OUTPATIENT OCCUPATIONAL THERAPY TREATMENT NOTE   [name] MRN: [id number] DOB:12-16-1965, 56 y.o., female Today's Date: 11/09/2021  PCP: Toma Deiters, MD REFERRING PROVIDER: Delano Metz, MD    END OF SESSION:   OT End of Session - 11/09/21 1646     Visit Number 15    Number of Visits 24    Date for OT Re-Evaluation 12/03/21    Authorization Type 1) [employer] 2) [place] medicaid UHC    Authorization Time Period 1) no copay 60 visit limit  2) No auth required for patients over 21 at this time. Adults - 27 visits per calendar year OT/PT/SLP    Authorization - Visit Number 15    Authorization - Number of Visits 60    OT Start Time 1600    OT Stop Time 1644    OT Time Calculation (min) 44 min    Activity Tolerance Patient tolerated treatment well    Behavior During Therapy WFL for tasks assessed/performed                 Past Medical History:  Diagnosis Date   Acute heart failure (HCC)    ADD (attention deficit disorder)    Bronchitis    Depression    Diabetes mellitus without complication (HCC)    Elevated troponin    Hypertension    Obesity    Past Surgical History:  Procedure Laterality Date   CESAREAN SECTION     Patient Active Problem List   Diagnosis Date Noted   Chronic combined systolic and diastolic congestive heart failure (HCC) 08/25/2020   Acute CVA (cerebrovascular accident) (HCC) 08/24/2020   Borderline prolonged QT interval 08/24/2020   Type 2 diabetes mellitus with complication, without long-term current use of insulin (HCC) 03/16/2020   Anasarca    Diabetes mellitus (HCC)    Hypokalemia    Vitamin D deficiency    Hypothyroidism    Physical deconditioning    Acute on chronic systolic CHF (congestive heart failure) (HCC) 03/09/2020   Acute on chronic systolic HF (heart failure) (HCC) 03/09/2020   Hyperglycemia 12/22/2018   Hypertensive crisis 04/04/2018   Acute CHF (congestive heart failure) (HCC) 04/04/2018    Mild renal insufficiency 04/04/2018   Elevated troponin 04/04/2018   Bronchitis    Obesity    Seborrheic dermatitis of scalp 05/23/2016   Essential hypertension 12/02/2015   Depression 12/02/2015   Primary insomnia 12/02/2015   Morbid obesity with body mass index of 50.0-59.9 in adult [facility]) 12/02/2015   Attention deficit hyperactivity disorder (ADHD), predominantly inattentive type 12/02/2015    ONSET DATE: 08/23/20  REFERRING DIAG: Left CVA with right side weakness  THERAPY DIAG:  Other symptoms and signs involving the musculoskeletal system  Other symptoms and signs involving the nervous system  Muscle weakness (generalized)  Other lack of coordination  Rationale for Evaluation and Treatment Rehabilitation   PERTINENT HISTORY: Patient sustained an acute lacunar infarct on the left side causing right side weakness. Patient reports that she received therapy services at [facility] for 2.5 weeks, and HH OT and PT for 1 month. Does not know the date when she sees Dr. Salvadore Farber for a follow-up. She is currently using a borrowed manual wheelchair from SNF [facility], [place] she is unaware when she needs to return it.   PRECAUTIONS: Fall and Other: right side hemiparesis  SUBJECTIVE: S: "Its been a long day. I did 10 sit to stands at the sink today."  PAIN:  Are you having  pain? No     OBJECTIVE:  UE ROM      Active ROM - seated. IR/er adducted Right 08/08/2021 Right 09/18/2021 Right  7/21  Shoulder flexion 0 0 0  Shoulder abduction 50 50 48  Shoulder internal rotation 60 60 60  Shoulder external rotation 0 - UE positioning assist 20 -UE positioning assist 15  Elbow flexion 136 140 126  Elbow extension -21 -4 -5  Wrist flexion  - gravity eliminated 66 66 70  Wrist extension - gravity eliminated 0 0 0  Wrist ulnar deviation unable unable Unable   Wrist radial deviation unable unable Unable   Wrist pronation unable unable Unable   Wrist supination 0 unable Unable    (Blank rows = not tested)     UE MMT:      MMT - seated. IR/er adducted Right 08/08/2021 Right  09/18/2021 Right  7/21  Shoulder flexion 3-/5 3-/5 3/5  Shoulder abduction 3-/5 3-/5 3/5  Shoulder internal rotation 3/5 3/5 3/5  Shoulder external rotation 3-/5 3-/5 3-/5  Elbow flexion 3/5 3/5 3/5  Elbow extension 3-/5 3/5 3/5  Wrist flexion 3/5 4-/5 4-/5  Wrist extension 3-/5 3-/5 3-/5  Wrist ulnar deviation 0/5 0/5 0/5  Wrist radial deviation 0/5 0/5 0/5  Wrist pronation 3-/5 3-/5 3-/5  Wrist supination 3-/5 3-/5 3-/5  (Blank rows = not tested)   HAND FUNCTION: Grip strength: Right: 6 lbs (6 lbs previous); Left: 58 lbs and Lateral pinch: Right: 10 lbs (5 lbs previous), Left: 12 lbs   COORDINATION: Unable to test 9 hole peg test  GOALS:     SHORT TERM GOALS: Target date: 09/19/2021   Patient/caregiver will be educated on HEP in order to facilitate her progress in therapy and increase her functional performance when completing ADL tasks and move towards more functional use of her RUE.  Baseline: Goal status: on-going   2.  Patient will complete bathing and dressing tasks at moderate assist level while seated and using a hemi dressing technique.  Baseline:  Goal status: on-going   3.  Patient will increase her RUE A/ROM by 10 degrees where able in order to increase her ability to assist and complete basic ADL tasks.  Baseline:  Goal status:on-going   4.  Patient will demonstrate an increase her right hand coordination in order to complete the 9 hole peg test with assist.  Baseline:  Goal status:on-going   5.  Patient will increase her hand hand grip and pinch strength by 2-3lbs in order to progress towards using her right hand to stabilize items when eating, grooming, etc. Baseline:  Goal status: on-going     LONG TERM GOALS: Target date: 10/31/2021   Patient will complete bathing,dressing, and grooming tasks at min assist level while seated and using a hemi dressing  technique.  Baseline:  Goal status:on-going   2.  Patient will be able to demonstrate a tub/shower transfer at min assist using a tub bench in order to return to taking shower with caregiver assisting. Baseline:  Goal status: on-going   3.  Patient will increase her RUE A/ROM in order to use her right arm/hand as an active assist when completing dressing, bathing, and grooming tasks.  Baseline:  Goal status: on-going   4.  Patient will demonstrate an  increase her right hand coordination by completing the 9 hole peg test without assistance. Baseline:  Goal status: on-going   5.  Patient will increase her right hand grip and pinch strength to  10% of average norms for age in order to progress her ability to use her hand as an active assist.  Baseline:  Goal status: on-going   PATIENT EDUCATION: Education details: discussed HEP completion Person educated: patient, mother Education method: explanation Education comprehension: verbalized understanding     HOME EXERCISE PROGRAM: 5/18: AA/ROM - shoulder supine and seated (table), elbow AA/ROM at table.  TODAY'S TREATMENT:  11/09/21  - sit<>stand transfers x4 CGA -AAROM: shoulder flexion x10, chest press x10 reps, R abduction/adduction x5, R external  rotation x10 - AAROM Shoulder stabilization: Small circles, small back and forths, small up and downs 10 reps x3 sets - Weight bearing: standing at table leaning into RUE, 5x45 seconds  -Yellow theraband biceps curl, 2x10, pt seated   11/07/21 - sit<>stand transfers x3 CGA -AAROM (RUE wrapped on PVC Pipe to assist grasp): shoulder flexion x10, chest press x10 reps, R abduction/adduction x5, R external  rotation x10 - AAROM Shoulder stabilization (RUE wrapped on PVS Pipe to assist grasp): Small circles, small back and forths, small up and downs 10 reps x3 sets -Supine<>sit bed transfer x2 mod A for BLE management on and off bed - Added exercises to HEP: AAROM seated chest presses,  shoulder flexion, wrist flexion and extension - Both hands clasped together and LUE guiding/assisting RUE through each exercise.  11/03/21 - ROM measurements taken for re evaluation  - Weight bearing: standing at table leaning into RUE, using LUE to place ping pong balls into egg carton  - AA/ROM: seated at table with RUE secured to PVC pipe with mod assist to support RUE during shoulder flexion 10 reps and shoulder protraction 10 reps, rest breaks given   CLINICAL IMPRESSION: A: Pt required min A for sit to stand transfers, completing four prior to feeling confident to complete pivot transfer to therapy table. Therapist providing verbal cues for sequencing and foot positioning. Pt able to maintain overhand and underhand grasp on pvc pipe while completing shoulder exercises. Therapist challenging pt with weight bearing duration today, limited by leg strength. Therapist assisting with positioning digits. Pt able to complete bicep curl against min therapist resistance, good activation of biceps.  PLAN: OT FREQUENCY: 2x/week   OT DURATION: 12 weeks   PLANNED INTERVENTIONS: self care/ADL training, therapeutic exercise, therapeutic activity, neuromuscular re-education, manual therapy, passive range of motion, balance training, splinting, electrical stimulation, ultrasound, moist heat, cryotherapy, patient/family education, visual/perceptual remediation/compensation, psychosocial skills training, energy conservation, coping strategies training, and DME and/or AE instructions   RECOMMENDED OTHER SERVICES: Manual wheelchair evaluation from mobility specialist   CONSULTED AND AGREED WITH PLAN OF CARE: Patient and family member/caregiver   PLAN FOR NEXT SESSION: P: standing weight bearing activity, reaching activity, AA/ROM,  NMES for wrist extensors. Discuss progress plan for 4 more weeks of visits with goal of d/c with HEP     Perley Jain, OTD, OTR/L [phone]  11/09/2021, 4:48 PM

## 2021-11-14 ENCOUNTER — Encounter (HOSPITAL_COMMUNITY): Payer: Self-pay

## 2021-11-14 ENCOUNTER — Ambulatory Visit (HOSPITAL_COMMUNITY): Payer: 59 | Attending: Family Medicine

## 2021-11-14 DIAGNOSIS — R2689 Other abnormalities of gait and mobility: Secondary | ICD-10-CM | POA: Insufficient documentation

## 2021-11-14 DIAGNOSIS — R29898 Other symptoms and signs involving the musculoskeletal system: Secondary | ICD-10-CM | POA: Insufficient documentation

## 2021-11-14 DIAGNOSIS — R29818 Other symptoms and signs involving the nervous system: Secondary | ICD-10-CM | POA: Diagnosis present

## 2021-11-14 DIAGNOSIS — M6281 Muscle weakness (generalized): Secondary | ICD-10-CM | POA: Diagnosis present

## 2021-11-14 DIAGNOSIS — R278 Other lack of coordination: Secondary | ICD-10-CM | POA: Insufficient documentation

## 2021-11-14 NOTE — Therapy (Unsigned)
OUTPATIENT OCCUPATIONAL THERAPY TREATMENT NOTE   [name] MRN: [id number] DOB:1965-04-29, 56 y.o., female Today's Date: 11/15/2021  PCP: Toma Deiters, MD REFERRING PROVIDER: Delano Metz, MD    END OF SESSION:   OT End of Session - 11/14/21 1329     Visit Number 16    Number of Visits 24    Date for OT Re-Evaluation 12/03/21    Authorization Type 1) [employer] 2) [place] medicaid UHC    Authorization Time Period 1) no copay 60 visit limit  2) No auth required for patients over 21 at this time. Adults - 27 visits per calendar year OT/PT/SLP    Authorization - Visit Number 15    Authorization - Number of Visits 60    OT Start Time 1327    OT Stop Time 1421    OT Time Calculation (min) 54 min    Activity Tolerance Patient tolerated treatment well    Behavior During Therapy WFL for tasks assessed/performed                 Past Medical History:  Diagnosis Date   Acute heart failure (HCC)    ADD (attention deficit disorder)    Bronchitis    Depression    Diabetes mellitus without complication (HCC)    Elevated troponin    Hypertension    Obesity    Past Surgical History:  Procedure Laterality Date   CESAREAN SECTION     Patient Active Problem List   Diagnosis Date Noted   Chronic combined systolic and diastolic congestive heart failure (HCC) 08/25/2020   Acute CVA (cerebrovascular accident) (HCC) 08/24/2020   Borderline prolonged QT interval 08/24/2020   Type 2 diabetes mellitus with complication, without long-term current use of insulin (HCC) 03/16/2020   Anasarca    Diabetes mellitus (HCC)    Hypokalemia    Vitamin D deficiency    Hypothyroidism    Physical deconditioning    Acute on chronic systolic CHF (congestive heart failure) (HCC) 03/09/2020   Acute on chronic systolic HF (heart failure) (HCC) 03/09/2020   Hyperglycemia 12/22/2018   Hypertensive crisis 04/04/2018   Acute CHF (congestive heart failure) (HCC) 04/04/2018    Mild renal insufficiency 04/04/2018   Elevated troponin 04/04/2018   Bronchitis    Obesity    Seborrheic dermatitis of scalp 05/23/2016   Essential hypertension 12/02/2015   Depression 12/02/2015   Primary insomnia 12/02/2015   Morbid obesity with body mass index of 50.0-59.9 in adult [facility]) 12/02/2015   Attention deficit hyperactivity disorder (ADHD), predominantly inattentive type 12/02/2015    ONSET DATE: 08/23/20  REFERRING DIAG: Left CVA with right side weakness  THERAPY DIAG:  Other symptoms and signs involving the musculoskeletal system  Other symptoms and signs involving the nervous system  Other lack of coordination  Rationale for Evaluation and Treatment Rehabilitation   PERTINENT HISTORY: Patient sustained an acute lacunar infarct on the left side causing right side weakness. Patient reports that she received therapy services at [facility] for 2.5 weeks, and HH OT and PT for 1 month. Does not know the date when she sees Dr. Salvadore Farber for a follow-up. She is currently using a borrowed manual wheelchair from SN[facility], [place] she is unaware when she needs to return it.   PRECAUTIONS: Fall and Other: right side hemiparesis  SUBJECTIVE: S: "I have been sitting in my chair most of the day"  PAIN:  Are you having pain? No     OBJECTIVE:  UE ROM      Active ROM - seated. IR/er adducted Right 08/08/2021 Right 09/18/2021 Right  7/21  Shoulder flexion 0 0 0  Shoulder abduction 50 50 48  Shoulder internal rotation 60 60 60  Shoulder external rotation 0 - UE positioning assist 20 -UE positioning assist 15  Elbow flexion 136 140 126  Elbow extension -21 -4 -5  Wrist flexion  - gravity eliminated 66 66 70  Wrist extension - gravity eliminated 0 0 0  Wrist ulnar deviation unable unable Unable   Wrist radial deviation unable unable Unable   Wrist pronation unable unable Unable   Wrist supination 0 unable Unable   (Blank rows = not tested)     UE MMT:       MMT - seated. IR/er adducted Right 08/08/2021 Right  09/18/2021 Right  7/21  Shoulder flexion 3-/5 3-/5 3/5  Shoulder abduction 3-/5 3-/5 3/5  Shoulder internal rotation 3/5 3/5 3/5  Shoulder external rotation 3-/5 3-/5 3-/5  Elbow flexion 3/5 3/5 3/5  Elbow extension 3-/5 3/5 3/5  Wrist flexion 3/5 4-/5 4-/5  Wrist extension 3-/5 3-/5 3-/5  Wrist ulnar deviation 0/5 0/5 0/5  Wrist radial deviation 0/5 0/5 0/5  Wrist pronation 3-/5 3-/5 3-/5  Wrist supination 3-/5 3-/5 3-/5  (Blank rows = not tested)   HAND FUNCTION: Grip strength: Right: 6 lbs (6 lbs previous); Left: 58 lbs and Lateral pinch: Right: 10 lbs (5 lbs previous), Left: 12 lbs   COORDINATION: Unable to test 9 hole peg test  GOALS:     SHORT TERM GOALS: Target date: 09/19/2021   Patient/caregiver will be educated on HEP in order to facilitate her progress in therapy and increase her functional performance when completing ADL tasks and move towards more functional use of her RUE.  Baseline: Goal status: on-going   2.  Patient will complete bathing and dressing tasks at moderate assist level while seated and using a hemi dressing technique.  Baseline:  Goal status: on-going   3.  Patient will increase her RUE A/ROM by 10 degrees where able in order to increase her ability to assist and complete basic ADL tasks.  Baseline:  Goal status:on-going   4.  Patient will demonstrate an increase her right hand coordination in order to complete the 9 hole peg test with assist.  Baseline:  Goal status:on-going   5.  Patient will increase her hand hand grip and pinch strength by 2-3lbs in order to progress towards using her right hand to stabilize items when eating, grooming, etc. Baseline:  Goal status: on-going     LONG TERM GOALS: Target date: 10/31/2021   Patient will complete bathing,dressing, and grooming tasks at min assist level while seated and using a hemi dressing technique.  Baseline:  Goal status:on-going    2.  Patient will be able to demonstrate a tub/shower transfer at min assist using a tub bench in order to return to taking shower with caregiver assisting. Baseline:  Goal status: on-going   3.  Patient will increase her RUE A/ROM in order to use her right arm/hand as an active assist when completing dressing, bathing, and grooming tasks.  Baseline:  Goal status: on-going   4.  Patient will demonstrate an  increase her right hand coordination by completing the 9 hole peg test without assistance. Baseline:  Goal status: on-going   5.  Patient will increase her right hand grip and pinch strength to 10% of average norms for age in order  to progress her ability to use her hand as an active assist.  Baseline:  Goal status: on-going   PATIENT EDUCATION: Education details: discussed HEP completion Person educated: patient, mother Education method: explanation Education comprehension: verbalized understanding     HOME EXERCISE PROGRAM: 5/18: AA/ROM - shoulder supine and seated (table), elbow AA/ROM at table.  TODAY'S TREATMENT:  11/14/21 -sit<>stand - step pivot transfer, min A face to face transfer, max verbal cuing for steps -AA/ROM: shoulder flexion x10, protraction x10 - requires min assist to stretch RUE to end range -Yellow theraband biceps curl, 1x10, seated rows 1x15, shoulder shrugs 1x10, triceps extensions - Weight bearing: standing at table leaning into RUE, 5x45 seconds  -Foam ball table slides: pt seated, right side parallel to table, elbow bent, protraction and abduction to push ball across table, min verbal cuing to extend elbow -Ring forward pushes: pt seated, right side parallel to table, forearm supported on table, fully extending elbow and trunk forward  flexion to move ring across table. . Cued for slow controlled motions  11/09/21  - sit<>stand transfers x4 CGA -AAROM: shoulder flexion x10, chest press x10 reps, R abduction/adduction x5, R external  rotation x10 -  AAROM Shoulder stabilization: Small circles, small back and forths, small up and downs 10 reps x3 sets - Weight bearing: standing at table leaning into RUE, 5x45 seconds  -Yellow theraband biceps curl, 2x10, pt seated   11/07/21 - sit<>stand transfers x3 CGA -AAROM (RUE wrapped on PVC Pipe to assist grasp): shoulder flexion x10, chest press x10 reps, R abduction/adduction x5, R external  rotation x10 - AAROM Shoulder stabilization (RUE wrapped on PVS Pipe to assist grasp): Small circles, small back and forths, small up and downs 10 reps x3 sets -Supine<>sit bed transfer x2 mod A for BLE management on and off bed - Added exercises to HEP: AAROM seated chest presses, shoulder flexion, wrist flexion and extension - Both hands clasped together and LUE guiding/assisting RUE through each exercise.   CLINICAL IMPRESSION: A: Pt continues to be able to maintain grip on PVC pipe and looped theraband with few adjustments throughout session. First part of session focused on scapular strengthening with use of theraband for different movements, therapist providing stabilization throughout UE as needed to ensure good alignment and proper form. Therapist cuing for pt to sit upright and use trunk control throughout session. Discussed fear of falling with pt and the importance of deep breathing to prevent fast/unsafe movements. Increased tolerance for standing and weightbearing through RUE today, therapist providing blocking at the elbow and digits and minimal assistance for sit to stand.     PLAN: OT FREQUENCY: 2x/week   OT DURATION: 12 weeks   PLANNED INTERVENTIONS: self care/ADL training, therapeutic exercise, therapeutic activity, neuromuscular re-education, manual therapy, passive range of motion, balance training, splinting, electrical stimulation, ultrasound, moist heat, cryotherapy, patient/family education, visual/perceptual remediation/compensation, psychosocial skills training, energy conservation,  coping strategies training, and DME and/or AE instructions   RECOMMENDED OTHER SERVICES: Manual wheelchair evaluation from mobility specialist   CONSULTED AND AGREED WITH PLAN OF CARE: Patient and family member/caregiver   PLAN FOR NEXT SESSION: P: standing weight bearing activity, reaching activity, AA/ROM    [employer], [place], OTR/L [phone]  11/15/2021, 7:48 AM

## 2021-11-16 ENCOUNTER — Encounter (HOSPITAL_COMMUNITY): Payer: Self-pay

## 2021-11-16 ENCOUNTER — Ambulatory Visit (HOSPITAL_COMMUNITY): Payer: 59

## 2021-11-16 DIAGNOSIS — R278 Other lack of coordination: Secondary | ICD-10-CM

## 2021-11-16 DIAGNOSIS — R29898 Other symptoms and signs involving the musculoskeletal system: Secondary | ICD-10-CM

## 2021-11-16 DIAGNOSIS — R29818 Other symptoms and signs involving the nervous system: Secondary | ICD-10-CM

## 2021-11-16 NOTE — Therapy (Signed)
OUTPATIENT OCCUPATIONAL THERAPY TREATMENT NOTE   Patient Name: Kalena Mander MRN: [id number] DOB:10/04/1965, 56 y.o., female Today's Date: 11/16/2021  PCP: Toma Deiters, MD REFERRING PROVIDER: Delano Metz, MD    END OF SESSION:   OT End of Session - 11/16/21 1440     Visit Number 17    Number of Visits 24    Date for OT Re-Evaluation 12/03/21    Authorization Type 1) [employer] 2) [place] medicaid UHC    Authorization Time Period 1) no copay 60 visit limit  2) No auth required for patients over 21 at this time. Adults - 27 visits per calendar year OT/PT/SLP    Authorization - Visit Number 16    Authorization - Number of Visits 60    OT Start Time 1433    OT Stop Time 1513    OT Time Calculation (min) 40 min    Activity Tolerance Patient tolerated treatment well    Behavior During Therapy WFL for tasks assessed/performed                 Past Medical History:  Diagnosis Date   Acute heart failure (HCC)    ADD (attention deficit disorder)    Bronchitis    Depression    Diabetes mellitus without complication (HCC)    Elevated troponin    Hypertension    Obesity    Past Surgical History:  Procedure Laterality Date   CESAREAN SECTION     Patient Active Problem List   Diagnosis Date Noted   Chronic combined systolic and diastolic congestive heart failure (HCC) 08/25/2020   Acute CVA (cerebrovascular accident) (HCC) 08/24/2020   Borderline prolonged QT interval 08/24/2020   Type 2 diabetes mellitus with complication, without long-term current use of insulin (HCC) 03/16/2020   Anasarca    Diabetes mellitus (HCC)    Hypokalemia    Vitamin D deficiency    Hypothyroidism    Physical deconditioning    Acute on chronic systolic CHF (congestive heart failure) (HCC) 03/09/2020   Acute on chronic systolic HF (heart failure) (HCC) 03/09/2020   Hyperglycemia 12/22/2018   Hypertensive crisis 04/04/2018   Acute CHF (congestive heart failure) (HCC) 04/04/2018    Mild renal insufficiency 04/04/2018   Elevated troponin 04/04/2018   Bronchitis    Obesity    Seborrheic dermatitis of scalp 05/23/2016   Essential hypertension 12/02/2015   Depression 12/02/2015   Primary insomnia 12/02/2015   Morbid obesity with body mass index of 50.0-59.9 in adult [facility]) 12/02/2015   Attention deficit hyperactivity disorder (ADHD), predominantly inattentive type 12/02/2015    ONSET DATE: 08/23/20  REFERRING DIAG: Left CVA with right side weakness  THERAPY DIAG:  Other symptoms and signs involving the musculoskeletal system  Other symptoms and signs involving the nervous system  Other lack of coordination  Rationale for Evaluation and Treatment Rehabilitation   PERTINENT HISTORY: Patient sustained an acute lacunar infarct on the left side causing right side weakness. Patient reports that she received therapy services at [facility] for 2.5 weeks, and HH OT and PT for 1 month. Does not know the date when she sees Dr. Salvadore Farber for a follow-up. She is currently using a borrowed manual wheelchair from SNF [facility], [place] she is unaware when she needs to return it.   PRECAUTIONS: Fall and Other: right side hemiparesis  SUBJECTIVE: S: "I do think I am getting a little stronger"  PAIN:  Are you having pain? No     OBJECTIVE:  UE ROM  Active ROM - seated. IR/er adducted Right 08/08/2021 Right 09/18/2021 Right  7/21  Shoulder flexion 0 0 0  Shoulder abduction 50 50 48  Shoulder internal rotation 60 60 60  Shoulder external rotation 0 - UE positioning assist 20 -UE positioning assist 15  Elbow flexion 136 140 126  Elbow extension -21 -4 -5  Wrist flexion  - gravity eliminated 66 66 70  Wrist extension - gravity eliminated 0 0 0  Wrist ulnar deviation unable unable Unable   Wrist radial deviation unable unable Unable   Wrist pronation unable unable Unable   Wrist supination 0 unable Unable   (Blank rows = not tested)     UE MMT:      MMT -  seated. IR/er adducted Right 08/08/2021 Right  09/18/2021 Right  7/21  Shoulder flexion 3-/5 3-/5 3/5  Shoulder abduction 3-/5 3-/5 3/5  Shoulder internal rotation 3/5 3/5 3/5  Shoulder external rotation 3-/5 3-/5 3-/5  Elbow flexion 3/5 3/5 3/5  Elbow extension 3-/5 3/5 3/5  Wrist flexion 3/5 4-/5 4-/5  Wrist extension 3-/5 3-/5 3-/5  Wrist ulnar deviation 0/5 0/5 0/5  Wrist radial deviation 0/5 0/5 0/5  Wrist pronation 3-/5 3-/5 3-/5  Wrist supination 3-/5 3-/5 3-/5  (Blank rows = not tested)   HAND FUNCTION: Grip strength: Right: 6 lbs (6 lbs previous); Left: 58 lbs and Lateral pinch: Right: 10 lbs (5 lbs previous), Left: 12 lbs   COORDINATION: Unable to test 9 hole peg test  GOALS:     SHORT TERM GOALS: Target date: 09/19/2021   Patient/caregiver will be educated on HEP in order to facilitate her progress in therapy and increase her functional performance when completing ADL tasks and move towards more functional use of her RUE.  Baseline: Goal status: on-going   2.  Patient will complete bathing and dressing tasks at moderate assist level while seated and using a hemi dressing technique.  Baseline:  Goal status: on-going   3.  Patient will increase her RUE A/ROM by 10 degrees where able in order to increase her ability to assist and complete basic ADL tasks.  Baseline:  Goal status:on-going   4.  Patient will demonstrate an increase her right hand coordination in order to complete the 9 hole peg test with assist.  Baseline:  Goal status:on-going   5.  Patient will increase her hand hand grip and pinch strength by 2-3lbs in order to progress towards using her right hand to stabilize items when eating, grooming, etc. Baseline:  Goal status: on-going     LONG TERM GOALS: Target date: 10/31/2021   Patient will complete bathing,dressing, and grooming tasks at min assist level while seated and using a hemi dressing technique.  Baseline:  Goal status:on-going   2.   Patient will be able to demonstrate a tub/shower transfer at min assist using a tub bench in order to return to taking shower with caregiver assisting. Baseline:  Goal status: on-going   3.  Patient will increase her RUE A/ROM in order to use her right arm/hand as an active assist when completing dressing, bathing, and grooming tasks.  Baseline:  Goal status: on-going   4.  Patient will demonstrate an  increase her right hand coordination by completing the 9 hole peg test without assistance. Baseline:  Goal status: on-going   5.  Patient will increase her right hand grip and pinch strength to 10% of average norms for age in order to progress her ability to use her  hand as an active assist.  Baseline:  Goal status: on-going   PATIENT EDUCATION: Education details: discussed HEP completion Person educated: patient, mother Education method: explanation Education comprehension: verbalized understanding     HOME EXERCISE PROGRAM: 5/18: AA/ROM - shoulder supine and seated (table), elbow AA/ROM at table.  TODAY'S TREATMENT:   11/16/21 -sit<>stand - step pivot transfer, min A face to face transfer, max verbal cuing for steps x2 -Ring forward pushes: pt seated, right side parallel to table, forearm supported on table, fully extending elbow and trunk forward  flexion to move ring across table -AA/ROM:  Boomwhacker held with both hands to reach to targets in various directions -Weight bearing: standing at table leaning into RUE, 5x 30 to 45 seconds, Min A for sit to stand -Yellow theraband 1x10 biceps curl, triceps extensions  11/14/21 -sit<>stand - step pivot transfer, min A face to face transfer, max verbal cuing for steps -AA/ROM: shoulder flexion x10, protraction x10 - requires min assist to stretch RUE to end range -Yellow theraband biceps curl, 1x10, seated rows 1x15, shoulder shrugs 1x10, triceps extensions - Weight bearing: standing at table leaning into RUE, 5x45 seconds  -Foam ball  table slides: pt seated, right side parallel to table, elbow bent, protraction and abduction to push ball across table, min verbal cuing to extend elbow -Ring forward pushes: pt seated, right side parallel to table, forearm supported on table, fully extending elbow and trunk forward  flexion to move ring across table. . Cued for slow controlled motions  11/09/21  - sit<>stand transfers x4 CGA -AAROM: shoulder flexion x10, chest press x10 reps, R abduction/adduction x5, R external  rotation x10 - AAROM Shoulder stabilization: Small circles, small back and forths, small up and downs 10 reps x3 sets - Weight bearing: standing at table leaning into RUE, 5x45 seconds  -Yellow theraband biceps curl, 2x10, pt seated  CLINICAL IMPRESSION: A: AA/ROM completed with boomwhacker today, encouraging maintained grip while reaching in various directions, most difficulty with forward flexion, reaching above chest level. Continued to encourage trunk control with UE movement, reaching forward with rings while seated next to table, RUE supported. Min A to stand and weightbearing through RUE, able to tolerate varying duration, cues for controled sitting. Requiring less support to have fingers flat on table and supporting elbow in extension. Therapist cuing for deep breathing prior to final sit to stand step pivot transfer, pt benefiting, less anxious with transfer.     PLAN: OT FREQUENCY: 2x/week   OT DURATION: 12 weeks   PLANNED INTERVENTIONS: self care/ADL training, therapeutic exercise, therapeutic activity, neuromuscular re-education, manual therapy, passive range of motion, balance training, splinting, electrical stimulation, ultrasound, moist heat, cryotherapy, patient/family education, visual/perceptual remediation/compensation, psychosocial skills training, energy conservation, coping strategies training, and DME and/or AE instructions   RECOMMENDED OTHER SERVICES: Manual wheelchair evaluation from mobility  specialist   CONSULTED AND AGREED WITH PLAN OF CARE: Patient and family member/caregiver   PLAN FOR NEXT SESSION: P: standing weight bearing activity, reaching activity, AA/ROM    [employer], [employer], OTR/L [phone]  11/16/2021, 3:28 PM

## 2021-11-21 ENCOUNTER — Ambulatory Visit (HOSPITAL_COMMUNITY): Payer: 59

## 2021-11-21 DIAGNOSIS — R29898 Other symptoms and signs involving the musculoskeletal system: Secondary | ICD-10-CM

## 2021-11-21 DIAGNOSIS — R29818 Other symptoms and signs involving the nervous system: Secondary | ICD-10-CM

## 2021-11-21 DIAGNOSIS — R278 Other lack of coordination: Secondary | ICD-10-CM

## 2021-11-22 ENCOUNTER — Encounter (HOSPITAL_COMMUNITY): Payer: Self-pay

## 2021-11-22 NOTE — Therapy (Signed)
OUTPATIENT OCCUPATIONAL THERAPY TREATMENT NOTE   [name] MRN: [id number] DOB:Nov 10, 1965, 56 y.o., female Today's Date: 11/22/2021  PCP: Toma Deiters, MD REFERRING PROVIDER: Delano Metz, MD    Pt arrived for apt, reported not feeling well and vomited. Session canceled.    END OF SESSION:   OT End of Session - 11/22/21 0826     Visit Number 17    Number of Visits 24    Date for OT Re-Evaluation 12/03/21    Authorization Type 1) [employer] 2) [place] medicaid UHC    Authorization Time Period 1) no copay 60 visit limit  2) No auth required for patients over 21 at this time. Adults - 27 visits per calendar year OT/PT/SLP    Authorization - Visit Number 16    Authorization - Number of Visits 60    OT Start Time 0315    OT Stop Time 0320    OT Time Calculation (min) 5 min    Activity Tolerance Patient tolerated treatment well    Behavior During Therapy WFL for tasks assessed/performed                 Past Medical History:  Diagnosis Date   Acute heart failure (HCC)    ADD (attention deficit disorder)    Bronchitis    Depression    Diabetes mellitus without complication (HCC)    Elevated troponin    Hypertension    Obesity    Past Surgical History:  Procedure Laterality Date   CESAREAN SECTION     Patient Active Problem List   Diagnosis Date Noted   Chronic combined systolic and diastolic congestive heart failure (HCC) 08/25/2020   Acute CVA (cerebrovascular accident) (HCC) 08/24/2020   Borderline prolonged QT interval 08/24/2020   Type 2 diabetes mellitus with complication, without long-term current use of insulin (HCC) 03/16/2020   Anasarca    Diabetes mellitus (HCC)    Hypokalemia    Vitamin D deficiency    Hypothyroidism    Physical deconditioning    Acute on chronic systolic CHF (congestive heart failure) (HCC) 03/09/2020   Acute on chronic systolic HF (heart failure) (HCC) 03/09/2020   Hyperglycemia 12/22/2018    Hypertensive crisis 04/04/2018   Acute CHF (congestive heart failure) (HCC) 04/04/2018   Mild renal insufficiency 04/04/2018   Elevated troponin 04/04/2018   Bronchitis    Obesity    Seborrheic dermatitis of scalp 05/23/2016   Essential hypertension 12/02/2015   Depression 12/02/2015   Primary insomnia 12/02/2015   Morbid obesity with body mass index of 50.0-59.9 in adult [facility]) 12/02/2015   Attention deficit hyperactivity disorder (ADHD), predominantly inattentive type 12/02/2015    ONSET DATE: 08/23/20  REFERRING DIAG: Left CVA with right side weakness  THERAPY DIAG:  Other symptoms and signs involving the musculoskeletal system  Other symptoms and signs involving the nervous system  Other lack of coordination  Rationale for Evaluation and Treatment Rehabilitation   PERTINENT HISTORY: Patient sustained an acute lacunar infarct on the left side causing right side weakness. Patient reports that she received therapy services at [facility] for 2.5 weeks, and HH OT and PT for 1 month. Does not know the date when she sees Dr. Salvadore Farber for a follow-up. She is currently using a borrowed manual wheelchair from SNF [facility], [place] she is unaware when she needs to return it.   PRECAUTIONS: Fall and Other: right side hemiparesis  SUBJECTIVE: S: "I haven't been feeling well today."  PAIN:  Are  you having pain? No     OBJECTIVE:  UE ROM      Active ROM - seated. IR/er adducted Right 08/08/2021 Right 09/18/2021 Right  7/21  Shoulder flexion 0 0 0  Shoulder abduction 50 50 48  Shoulder internal rotation 60 60 60  Shoulder external rotation 0 - UE positioning assist 20 -UE positioning assist 15  Elbow flexion 136 140 126  Elbow extension -21 -4 -5  Wrist flexion  - gravity eliminated 66 66 70  Wrist extension - gravity eliminated 0 0 0  Wrist ulnar deviation unable unable Unable   Wrist radial deviation unable unable Unable   Wrist pronation unable unable Unable   Wrist  supination 0 unable Unable   (Blank rows = not tested)     UE MMT:      MMT - seated. IR/er adducted Right 08/08/2021 Right  09/18/2021 Right  7/21  Shoulder flexion 3-/5 3-/5 3/5  Shoulder abduction 3-/5 3-/5 3/5  Shoulder internal rotation 3/5 3/5 3/5  Shoulder external rotation 3-/5 3-/5 3-/5  Elbow flexion 3/5 3/5 3/5  Elbow extension 3-/5 3/5 3/5  Wrist flexion 3/5 4-/5 4-/5  Wrist extension 3-/5 3-/5 3-/5  Wrist ulnar deviation 0/5 0/5 0/5  Wrist radial deviation 0/5 0/5 0/5  Wrist pronation 3-/5 3-/5 3-/5  Wrist supination 3-/5 3-/5 3-/5  (Blank rows = not tested)   HAND FUNCTION: Grip strength: Right: 6 lbs (6 lbs previous); Left: 58 lbs and Lateral pinch: Right: 10 lbs (5 lbs previous), Left: 12 lbs   COORDINATION: Unable to test 9 hole peg test  GOALS:     SHORT TERM GOALS: Target date: 09/19/2021   Patient/caregiver will be educated on HEP in order to facilitate her progress in therapy and increase her functional performance when completing ADL tasks and move towards more functional use of her RUE.  Baseline: Goal status: on-going   2.  Patient will complete bathing and dressing tasks at moderate assist level while seated and using a hemi dressing technique.  Baseline:  Goal status: on-going   3.  Patient will increase her RUE A/ROM by 10 degrees where able in order to increase her ability to assist and complete basic ADL tasks.  Baseline:  Goal status:on-going   4.  Patient will demonstrate an increase her right hand coordination in order to complete the 9 hole peg test with assist.  Baseline:  Goal status:on-going   5.  Patient will increase her hand hand grip and pinch strength by 2-3lbs in order to progress towards using her right hand to stabilize items when eating, grooming, etc. Baseline:  Goal status: on-going     LONG TERM GOALS: Target date: 10/31/2021   Patient will complete bathing,dressing, and grooming tasks at min assist level while seated  and using a hemi dressing technique.  Baseline:  Goal status:on-going   2.  Patient will be able to demonstrate a tub/shower transfer at min assist using a tub bench in order to return to taking shower with caregiver assisting. Baseline:  Goal status: on-going   3.  Patient will increase her RUE A/ROM in order to use her right arm/hand as an active assist when completing dressing, bathing, and grooming tasks.  Baseline:  Goal status: on-going   4.  Patient will demonstrate an  increase her right hand coordination by completing the 9 hole peg test without assistance. Baseline:  Goal status: on-going   5.  Patient will increase her right hand grip and pinch  strength to 10% of average norms for age in order to progress her ability to use her hand as an active assist.  Baseline:  Goal status: on-going   PATIENT EDUCATION: Education details: discussed HEP completion Person educated: patient, mother Education method: explanation Education comprehension: verbalized understanding     HOME EXERCISE PROGRAM: 5/18: AA/ROM - shoulder supine and seated (table), elbow AA/ROM at table.  TODAY'S TREATMENT:   11/16/21 -sit<>stand - step pivot transfer, min A face to face transfer, max verbal cuing for steps x2 -Ring forward pushes: pt seated, right side parallel to table, forearm supported on table, fully extending elbow and trunk forward  flexion to move ring across table -AA/ROM:  Boomwhacker held with both hands to reach to targets in various directions -Weight bearing: standing at table leaning into RUE, 5x 30 to 45 seconds, Min A for sit to stand -Yellow theraband 1x10 biceps curl, triceps extensions  11/14/21 -sit<>stand - step pivot transfer, min A face to face transfer, max verbal cuing for steps -AA/ROM: shoulder flexion x10, protraction x10 - requires min assist to stretch RUE to end range -Yellow theraband biceps curl, 1x10, seated rows 1x15, shoulder shrugs 1x10, triceps  extensions - Weight bearing: standing at table leaning into RUE, 5x45 seconds  -Foam ball table slides: pt seated, right side parallel to table, elbow bent, protraction and abduction to push ball across table, min verbal cuing to extend elbow -Ring forward pushes: pt seated, right side parallel to table, forearm supported on table, fully extending elbow and trunk forward  flexion to move ring across table. . Cued for slow controlled motions  11/09/21  - sit<>stand transfers x4 CGA -AAROM: shoulder flexion x10, chest press x10 reps, R abduction/adduction x5, R external  rotation x10 - AAROM Shoulder stabilization: Small circles, small back and forths, small up and downs 10 reps x3 sets - Weight bearing: standing at table leaning into RUE, 5x45 seconds  -Yellow theraband biceps curl, 2x10, pt seated  CLINICAL IMPRESSION: A: Pt presenting to clinic today reporting not feeling well and vomiting at start of visit. Pt sent home, session canceled.      PLAN: OT FREQUENCY: 2x/week   OT DURATION: 12 weeks   PLANNED INTERVENTIONS: self care/ADL training, therapeutic exercise, therapeutic activity, neuromuscular re-education, manual therapy, passive range of motion, balance training, splinting, electrical stimulation, ultrasound, moist heat, cryotherapy, patient/family education, visual/perceptual remediation/compensation, psychosocial skills training, energy conservation, coping strategies training, and DME and/or AE instructions   RECOMMENDED OTHER SERVICES: Manual wheelchair evaluation from mobility specialist   CONSULTED AND AGREED WITH PLAN OF CARE: Patient and family member/caregiver   PLAN FOR NEXT SESSION: P: standing weight bearing activity, reaching activity, AA/ROM    [employer], OTD, OTR/L [phone]  11/22/2021, 8:32 AM

## 2021-11-23 ENCOUNTER — Telehealth (HOSPITAL_COMMUNITY): Payer: Self-pay

## 2021-11-23 ENCOUNTER — Encounter (HOSPITAL_COMMUNITY): Payer: 59

## 2021-11-23 NOTE — Telephone Encounter (Signed)
Called regarding missed appointment today; left message on number listed for home regarding missed appointment and OT appointment tomorrow; also tried number listed for her mother but a gentleman answered who stated I had the wrong number.  4:38 PM, 11/23/21 Marveen Donlon Small Demarques Pilz MPT [facility] [place] [phone]

## 2021-11-23 NOTE — Addendum Note (Signed)
Addended byWilhemena Durie S on: 11/23/2021 03:23 PM   Modules accepted: Orders

## 2021-11-24 ENCOUNTER — Ambulatory Visit (HOSPITAL_COMMUNITY): Payer: 59 | Admitting: Occupational Therapy

## 2021-11-24 ENCOUNTER — Telehealth (HOSPITAL_COMMUNITY): Payer: Self-pay | Admitting: Occupational Therapy

## 2021-11-24 NOTE — Telephone Encounter (Signed)
Pt l/m she can not come in today please cx she is sick

## 2021-11-28 ENCOUNTER — Encounter (HOSPITAL_COMMUNITY): Payer: Self-pay | Admitting: Occupational Therapy

## 2021-11-28 ENCOUNTER — Ambulatory Visit (HOSPITAL_COMMUNITY): Payer: 59 | Admitting: Occupational Therapy

## 2021-11-28 DIAGNOSIS — R29898 Other symptoms and signs involving the musculoskeletal system: Secondary | ICD-10-CM

## 2021-11-28 DIAGNOSIS — R29818 Other symptoms and signs involving the nervous system: Secondary | ICD-10-CM

## 2021-11-28 DIAGNOSIS — R278 Other lack of coordination: Secondary | ICD-10-CM

## 2021-11-28 NOTE — Therapy (Signed)
OUTPATIENT OCCUPATIONAL THERAPY REASSESSMENT AND DISCHARGE SUMMARY   [name] MRN: [id number] DOB:1965-06-09, 56 y.o., female Today's Date: 11/28/2021  PCP: Neale Burly, MD REFERRING PROVIDER: Ledora Bottcher, MD    END OF SESSION:   OT End of Session - 11/28/21 1421     Visit Number 18    Number of Visits 24    Date for OT Re-Evaluation 12/03/21    Authorization Type 1) [employer] 2) [place] medicaid UHC    Authorization Time Period 1) no copay 60 visit limit  2) No auth required for patients over 21 at this time. Adults - 27 visits per calendar year OT/PT/SLP    Authorization - Visit Number 17    Authorization - Number of Visits 65    OT Start Time 1322    OT Stop Time 1410    OT Time Calculation (min) 48 min    Activity Tolerance Patient tolerated treatment well    Behavior During Therapy WFL for tasks assessed/performed                  Past Medical History:  Diagnosis Date   Acute heart failure ([place])    ADD (attention deficit disorder)    Bronchitis    Depression    Diabetes mellitus without complication ([place])    Elevated troponin    Hypertension    Obesity    Past Surgical History:  Procedure Laterality Date   CESAREAN SECTION     Patient Active Problem List   Diagnosis Date Noted   Chronic combined systolic and diastolic congestive heart failure ([place]) 08/25/2020   Acute CVA (cerebrovascular accident) ([place]) 08/24/2020   Borderline prolonged QT interval 08/24/2020   Type 2 diabetes mellitus with complication, without long-term current use of insulin ([place]) 03/16/2020   Anasarca    Diabetes mellitus ([place])    Hypokalemia    Vitamin D deficiency    Hypothyroidism    Physical deconditioning    Acute on chronic systolic CHF (congestive heart failure) ([place]) 03/09/2020   Acute on chronic systolic HF (heart failure) ([place]) 03/09/2020   Hyperglycemia 12/22/2018   Hypertensive crisis 04/04/2018   Acute CHF (congestive heart  failure) ([place]) 04/04/2018   Mild renal insufficiency 04/04/2018   Elevated troponin 04/04/2018   Bronchitis    Obesity    Seborrheic dermatitis of scalp 05/23/2016   Essential hypertension 12/02/2015   Depression 12/02/2015   Primary insomnia 12/02/2015   Morbid obesity with body mass index of 50.0-59.9 in adult [facility]) 12/02/2015   Attention deficit hyperactivity disorder (ADHD), predominantly inattentive type 12/02/2015    ONSET DATE: 08/23/20  REFERRING DIAG: Left CVA with right side weakness  THERAPY DIAG:  Other symptoms and signs involving the musculoskeletal system  Other symptoms and signs involving the nervous system  Other lack of coordination  Rationale for Evaluation and Treatment Rehabilitation   PERTINENT HISTORY: Patient sustained an acute lacunar infarct on the left side causing right side weakness. Patient reports that she received therapy services at [facility] for 2.5 weeks, and HH OT and PT for 1 month. Does not know the date when she sees Dr. Neta Mends for a follow-up. She is currently using a borrowed manual wheelchair from SNF ([facility] she is unaware when she needs to return it.   PRECAUTIONS: Fall and Other: right side hemiparesis  SUBJECTIVE: S: "It doesn't do shit."   PAIN:  Are you having pain? No     OBJECTIVE:  UE ROM  Active ROM - seated. IR/er adducted Right 08/08/2021 Right 09/18/2021 Right  7/21 Right 8/15//23  Shoulder flexion 0 0 0 0  Shoulder abduction 50 50 48 55  Shoulder internal rotation 60 60 60 60  Shoulder external rotation 0 - UE positioning assist 20 -UE positioning assist 15 15  Elbow flexion 136 140 126 140  Elbow extension -21 -4 -5 0  Wrist flexion  - gravity eliminated 66 66 70 60  Wrist extension - gravity eliminated 0 0 0 0  Wrist ulnar deviation unable unable Unable  unable  Wrist radial deviation unable unable Unable  unable  Wrist pronation unable unable Unable  90  Wrist supination 0 unable  Unable  0  (Blank rows = not tested)     UE MMT:      MMT - seated. IR/er adducted Right 08/08/2021 Right  09/18/2021 Right  7/21 Right 11/28/21  Shoulder flexion 3-/5 3-/5 3/5 3/5  Shoulder abduction 3-/5 3-/5 3/5 3-/5  Shoulder internal rotation 3/5 3/5 3/5 3/5  Shoulder external rotation 3-/5 3-/5 3-/5 3-/5  Elbow flexion 3/5 3/5 3/5 3/5  Elbow extension 3-/5 3/5 3/5 3/5  Wrist flexion 3/5 4-/5 4-/5 4-/5  Wrist extension 3-/5 3-/5 3-/5 2/5  Wrist ulnar deviation 0/5 0/5 0/5 0/5  Wrist radial deviation 0/5 0/5 0/5 0/5  Wrist pronation 3-/5 3-/5 3-/5 3-/5  Wrist supination 3-/5 3-/5 3-/5 2+/5  (Blank rows = not tested)   HAND FUNCTION: Grip strength: Right: 6 lbs (6 lbs previous); Left: 58 lbs and Lateral pinch: Right: 10 lbs (5 lbs previous), Left: 12 lbs 11/28/21: Grip: right 6 lbs; lateral pinch 7 lbs    COORDINATION: Unable to test 9 hole peg test  GOALS:     SHORT TERM GOALS: Target date: 09/19/2021   Patient/caregiver will be educated on HEP in order to facilitate her progress in therapy and increase her functional performance when completing ADL tasks and move towards more functional use of her RUE.  Baseline: Goal status: PARTIALLY MET   2.  Patient will complete bathing and dressing tasks at moderate assist level while seated and using a hemi dressing technique.  Baseline:  Goal status: MET   3.  Patient will increase her RUE A/ROM by 10 degrees where able in order to increase her ability to assist and complete basic ADL tasks.  Baseline:  Goal status: PARTIALLY MET   4.  Patient will demonstrate an increase her right hand coordination in order to complete the 9 hole peg test with assist.  Baseline:  Goal status: NOT MET   5.  Patient will increase her hand hand grip and pinch strength by 2-3lbs in order to progress towards using her right hand to stabilize items when eating, grooming, etc. Baseline:  Goal status: NOT MET     LONG TERM GOALS: Target date:  10/31/2021   Patient will complete bathing,dressing, and grooming tasks at min assist level while seated and using a hemi dressing technique.  Baseline:  Goal status: NOT MET   2.  Patient will be able to demonstrate a tub/shower transfer at min assist using a tub bench in order to return to taking shower with caregiver assisting. Baseline:  Goal status: NOT MET   3.  Patient will increase her RUE A/ROM in order to use her right arm/hand as an active assist when completing dressing, bathing, and grooming tasks.  Baseline:  Goal status: NOT MET   4.  Patient will demonstrate an  increase  her right hand coordination by completing the 9 hole peg test without assistance. Baseline:  Goal status: NOT MET   5.  Patient will increase her right hand grip and pinch strength to 10% of average norms for age in order to progress her ability to use her hand as an active assist.  Baseline:  Goal status: NOT MET   PATIENT EDUCATION: Education details: educated on following up with neurologist and asking about tone management Person educated: patient, mother Education method: explanation Education comprehension: verbalized understanding     HOME EXERCISE PROGRAM: 5/18: AA/ROM - shoulder supine and seated (table), elbow AA/ROM at table.  TODAY'S TREATMENT:  11/28/21 Session focusing on reassessment tasks and discharge conversation today. Discussed home functioning-pt reporting she is not willing to try any type of shower  or tub transfer bench due to fear of it being unsafe therefore only sponge bathes. Reports her parents help her with all ADLs-dressing, bathing, meal preparation, and all I/ADLs. Pt and Mother feel that they provide approximately 50% assistance with dressing and bathing tasks. Pt reports she completes her HEP all day, however is unable to provide any examples of which exercises she is completing and what is difficult or what has become easier. Mother reports improvement in pulling  herself up in the bed independently by leaning forward and using the sheet to pull herself into sitting. Pt reports her Dad helps her transfer into the bed from her wheelchair. Discussed updates on wheelchair evaluation, pt reports no-one has come to her home to assess and that she has not heard from anyone else at [facility] OT offered to follow up with [facility], as per chart review on 08/24/21 note states [employer] was supposed to go to her home for further assessment. Pt asked what phone number was in her chart, OT relayed and pt reports those numbers are incorrect. Pt nor mother able to recall their phone numbers for update. Pt to call clinic to provide updated phone numbers.    11/16/21 -sit<>stand - step pivot transfer, min A face to face transfer, max verbal cuing for steps x2 -Ring forward pushes: pt seated, right side parallel to table, forearm supported on table, fully extending elbow and trunk forward  flexion to move ring across table -AA/ROM:  Boomwhacker held with both hands to reach to targets in various directions -Weight bearing: standing at table leaning into RUE, 5x 30 to 45 seconds, Min A for sit to stand -Yellow theraband 1x10 biceps curl, triceps extensions  11/14/21 -sit<>stand - step pivot transfer, min A face to face transfer, max verbal cuing for steps -AA/ROM: shoulder flexion x10, protraction x10 - requires min assist to stretch RUE to end range -Yellow theraband biceps curl, 1x10, seated rows 1x15, shoulder shrugs 1x10, triceps extensions - Weight bearing: standing at table leaning into RUE, 5x45 seconds  -Foam ball table slides: pt seated, right side parallel to table, elbow bent, protraction and abduction to push ball across table, min verbal cuing to extend elbow -Ring forward pushes: pt seated, right side parallel to table, forearm supported on table, fully extending elbow and trunk forward  flexion to move ring across table. . Cued for slow controlled motions    CLINICAL  IMPRESSION: A: Reassessment completed this session. Pt has met 1 STG and partially met 2 additional STGs since evaluation on 08/08/2021. Pt has made minimal progress throughout course of therapy, has minimal ROM and strength improvements, tone is a limiting factor in progress. Discussed progress with pt, recommend discharging  today as pt is not making progress. Pt upset about this, asking why she isn't getting better and stating therapy is a waste of time. OT discussing course of therapy with pt as well as effect of stroke on the brain and subsequent functioning, also reminding pt of frequent refusals to attempt exercise or activities that she deemed too difficult. Pt stating she did the best she could and also stating her completes her HEP daily, however pt often reporting no completion of HEP at previous visits. Discussed follow up with neurology for tone management, potential to return to therapy after a break or if treatment like botox is completed. Educated pt and mother on need to show progress to continue with therapy services, mother asking if they could return if the doctor sends them back. OT stating the MD can send a referral at any time, however if continue to not show progress we cannot continue with additional therapy. Pt and Mother verbalize understanding, OT asking if pt had any additional questions or if there were any tasks OT can assist with brainstorming for. Pt refusing, stating she understands and it's time for her to leave.     PLAN: OT FREQUENCY: 2x/week   OT DURATION: 12 weeks   PLANNED INTERVENTIONS: self care/ADL training, therapeutic exercise, therapeutic activity, neuromuscular re-education, manual therapy, passive range of motion, balance training, splinting, electrical stimulation, ultrasound, moist heat, cryotherapy, patient/family education, visual/perceptual remediation/compensation, psychosocial skills training, energy conservation, coping strategies training, and DME  and/or AE instructions   RECOMMENDED OTHER SERVICES: Manual wheelchair evaluation from mobility specialist   CONSULTED AND AGREED WITH PLAN OF CARE: Patient and family member/caregiver   PLAN FOR NEXT SESSION: P: discharge pt to follow up with neurology.      OCCUPATIONAL THERAPY DISCHARGE SUMMARY  Visits from Start of Care: 18  Current functional level related to goals / functional outcomes: See above. Pt has met 1 STG and partially met 2 additional STGs since evaluation on 08/08/2021. Pt has made minimal progress throughout course of therapy, has minimal ROM and strength improvements, tone is a limiting factor in progress. Discussed progress with pt, recommend discharging today as pt is not making progress. Lack of motivation and non-compliance with HEP also impacting progress.    Remaining deficits: Continues to have significant flexor tone, strength and ROM limited, unable to use RUE for any volitional tasks or as assist for any ADLs   Education / Equipment: HEPs throughout course of therapy   Patient agrees to discharge. Patient goals were not met. Patient is being discharged due to lack of progress.     Guadelupe Sabin, OTR/L  [phone] 11/28/2021, 2:21 PM

## 2021-11-30 ENCOUNTER — Encounter (HOSPITAL_COMMUNITY): Payer: 59

## 2021-11-30 ENCOUNTER — Ambulatory Visit (HOSPITAL_COMMUNITY): Payer: 59

## 2021-11-30 DIAGNOSIS — R29818 Other symptoms and signs involving the nervous system: Secondary | ICD-10-CM

## 2021-11-30 DIAGNOSIS — R278 Other lack of coordination: Secondary | ICD-10-CM

## 2021-11-30 DIAGNOSIS — M6281 Muscle weakness (generalized): Secondary | ICD-10-CM

## 2021-11-30 DIAGNOSIS — R29898 Other symptoms and signs involving the musculoskeletal system: Secondary | ICD-10-CM | POA: Diagnosis not present

## 2021-11-30 DIAGNOSIS — R2689 Other abnormalities of gait and mobility: Secondary | ICD-10-CM

## 2021-11-30 NOTE — Therapy (Addendum)
OUTPATIENT PHYSICAL THERAPY DISCHARGE PHYSICAL THERAPY DISCHARGE SUMMARY  Visits from Start of Care: 21  Current functional level related to goals / functional outcomes: See below   Remaining deficits: See below   Education / Equipment: See below   Patient agrees to discharge. Patient goals were partially met. Patient is being discharged due to lack of progress.    11/30/21 1525  PT Visits / Re-Eval  Visit Number 21  Number of Visits 23  Date for PT Re-Evaluation 11/30/21  Authorization  Authorization Type [employer] (60 VL 10 used, 72 remain)  Authorization - Visit Number 21  Authorization - Number of Visits 12  PT Time Calculation  PT Start Time 0245  PT Stop Time 0315  PT Time Calculation (min) 30 min       Patient Name: August Gosser MRN: [id number] DOB:[birth year], 56 y.o., female Today's Date: 11/30/2021  PCP: Neale Burly, MD REFERRING PROVIDER: Curly Rim, MD     See note below for Objective Data and Assessment of Progress/Goals  END OF SESSION:           Past Medical History:  Diagnosis Date   Acute heart failure ([place])    ADD (attention deficit disorder)    Bronchitis    Depression    Diabetes mellitus without complication ([place])    Elevated troponin    Hypertension    Obesity    Past Surgical History:  Procedure Laterality Date   CESAREAN SECTION     Patient Active Problem List   Diagnosis Date Noted   Chronic combined systolic and diastolic congestive heart failure ([place]) 08/25/2020   Acute CVA (cerebrovascular accident) ([place]) 08/24/2020   Borderline prolonged QT interval 08/24/2020   Type 2 diabetes mellitus with complication, without long-term current use of insulin ([place]) 03/16/2020   Anasarca    Diabetes mellitus ([place])    Hypokalemia    Vitamin D deficiency    Hypothyroidism    Physical deconditioning    Acute on chronic systolic CHF (congestive heart failure) ([place]) 03/09/2020   Acute on chronic systolic HF  (heart failure) ([place]) 03/09/2020   Hyperglycemia 12/22/2018   Hypertensive crisis 04/04/2018   Acute CHF (congestive heart failure) ([place]) 04/04/2018   Mild renal insufficiency 04/04/2018   Elevated troponin 04/04/2018   Bronchitis    Obesity    Seborrheic dermatitis of scalp 05/23/2016   Essential hypertension 12/02/2015   Depression 12/02/2015   Primary insomnia 12/02/2015   Morbid obesity with body mass index of 50.0-59.9 in adult [facility]) 12/02/2015   Attention deficit hyperactivity disorder (ADHD), predominantly inattentive type 12/02/2015    REFERRING DIAG: Z86.73 (ICD-10-CM) - Personal history of transient ischemic attack (TIA), and cerebral infarction without residual deficits     THERAPY DIAG:  No diagnosis found.  PERTINENT HISTORY:  Essential hypertension;  Hypothyroidism due to Hashimoto's thyroiditis;  CVD (cerebrovascular disease);  History of CVA (cerebrovascular accident);  Type 2 diabetes mellitus with hyperglycemia   PRECAUTIONS: fall   SUBJECTIVE: Last PT visit 01/08/22.  Tremor right leg.  No pain today. Today is last day of certification. Has been coming to therapy since 07/13/21; " I don't think I am any better" trying to do exercises at home.   PAIN:  Are you having pain? No  OBJECTIVE:        TODAY'S TREATMENT:  11/30/21 Progress note Ambulation with platform walker and CGA with WC following x 15 ft  11/07/21 Amb with hemi cane 12'x2 with seated rest between STS with  Lt UE support (unable to do wihtout UE support due to fatigue following OT). Standing marching 5 reps then requested to leave early due to fatigue.  11/03/21 Amb with hemi cane 12'x2 with seated rest between Pull to stand x10 Sit to stand no UE support at CG/mina Standing march x8 reps Seated physioball forward rolls x10 Left UE reach to right calf x5   10/30/21 Progress note WC to mat table squat pivot transfer with CGA Sit on EOB with 1 UE support x 5 min Sit to supine min A  for legs Supine to sit mod A to brink trunk upright Roll on mat supine to prone modified independent Sit to stand with hemi walker from mat table x 3 CGA   Bridge x 10  Ambulation with hemi-walker and CGA with WC following x 4 ft; then rest and again x 2 steps with hemi walker and CGA; noted Right ankle instability/ foot drop         10/25/19:  Patient present with mom and manual WC and presenting with R LE clonus of concern to patient.    - Sitting in [facility] for discussion on clonus and practice on deep pressure from L UE into R knee downward x 30 sec x 2 or through weight shift into R LE    - Sitting hamstring/nerve stretch with belt x 30 sec x 3 reps   - Discussion and HEP review    - LAQ x 10 reps    - Heel/toe raises x 10 reps    - sitting marching x 10 reps    - sit to stand x 10 reps   Transfer to right through lateral step with minA x 4 steps onto plinth   - Lateral scoot trial x 4 to R, maxA to sidelying R to roll to back    - Cont HEP review in bed with bed mobility handouts    - Supine Bridge x 10     - Supine marching x 10 reps   Return to sitting EOB with modA   - Transfer sit to stand with hemi walker and CGA   - Transfer side step to left x 4 with minA and then down into [facility]   10/06/19 Stand:  Advancing RT leg x 3 3 reps              Lifting RT leg off the floor x 3 3 reps               Wt shifting x 5               Gt with hemi walker  1 time 20" second time 23 ft  Sit:      Sit to stand x 15             Resisted LAQ x 10             Resisted hip abduction x 10/adduction x 10               Lean back then pull forward to work abdominals x 10             Paloff one hand x 10   both sides             PNF 1 and 2 x 10 with blue theraband. (Hand to Rt knee; Hand to Rt shoulder )  09/27/21 Gait training Rt UE platform walker: 3 sets 1'43" 38f; 2'23" 270f 57" [id number] to LE fatigue and trimmers Pt able to stand with Lt UE pushing from  chair 5x Bed mobility MMT Reviewed goals  09/15/21 Pull to stand at // bar 2x5 Ambulation using Rt UE platform walker, 10', 20', 10' walks with rest between Leg press left leg 3 plates x20, right leg 2 plates x20 with cues to control knee hyperextension  09/13/21 Pull to stand at // bar 2x10 Ambulation using Rt UE platform walker,  3', attempted second stand Nustep x5 mins, b/l LE, uni R UE Seated quad extensions at cc with 20# x20                   Italics below from Evaluation on 07/13/2021 DIAGNOSTIC FINDINGS: MRI 08/24/20 IMPRESSION: Acute lacunar type infarct involving the posterior limb of the left internal capsule. Additional small subacute lacunar type infarct involving the left periatrial white matter.             MMT Right 07/13/2021 Left 07/13/2021 Right 08/24/2021 Left 08/24/2021 Right 09/27/21 Left  09/27/21 Right 10/30/21 Left 10/30/21 Right 11/30/21 Left 11/30/21  Hip flexion 3-/5 5/5 3- 5 Supine: 3+/5 Supine:  5/5 Sitting 3- 5 3- 5  Hip extension  2-   2+ 4- Prone: 2+ Prone 4-/5      Hip abduction  3-     Sidelying:  Flexed hip 3/5  Sidelying: Compensates with flexed hip 4+/5 Sidelying Flexed hip 3+ 4+    Hip adduction              Hip internal rotation              Hip external rotation              Knee flexion 2/5tested prone 5/5 2+ 5 Prone: 2+ Prone: 5/5 Prone 3- Prone 5    Knee extension 4-/5 5/5 4 5  4/5 5/5 4+ 5 3- 5  Ankle dorsiflexion 3/5 5/5 2- 5 2/5 5/5 2 5  2+ 5  Sit to stand and transfer with mod assist with pt having a posterior lean. Sit to supine with mod assist Supine to side lying to prone with min assist   TX:  sit to stand x 5;                 Sitting:  LAQ x 10 with manual resistance                 Side lying hip abduction x 5                Supine:  bridge x 10                        SLR x 10               Isometric ab/adduction x 10                        Clam x 10                       Dead bug with therapist assist of RT UE  x 10                 Prone:  Knee flexion, SLR x 10@    07/13/2021  LE ROM:    WFL for tasks assessed  MMT:     MMT Right 07/13/2021 Left 07/13/2021  Hip flexion 3-/5 5/5  Hip extension      Hip abduction      Hip adduction      Hip internal rotation      Hip external rotation      Knee flexion 3+/5 5/5  Knee extension 4-/5 5/5  Ankle dorsiflexion 3/5 5/5   Ankle plantarflexion      Ankle inversion      Ankle eversion      (Blank rows = not tested)     TRANSFERS: Assistive device utilized: Hemi walker and Wheelchair (manual)  Sit to stand: Mod A Stand to sit: Mod A       GAIT: Gait pattern: ataxic Distance walked: 3 feet Assistive device utilized:  hemiwalker Level of assistance:  min/mod assist Comments: unsteady   PATIENT EDUCATION:  Education details: 08/24/2021 progress and plan of care. Patient educated on exam findings, POC, scope of PT, HEP, and increasing activity. 08/04/21 = education on nerve tension, nerve and muscle lengthening, clonus reaction and pressure to stop; and mostly focused on education of increased movement for unweighting "wiggle throughout day" 10/24/21: HEP review, clonus, increased WB after Person educated: Patient Education method: Explanation, Demonstration, and Handouts Education comprehension: verbalized understanding, returned demonstration, verbal cues required, and tactile cues required      HOME EXERCISE PROGRAM: EValuation:Marching 2x 10                     LAQ 2x 10 5 Second                    Heel/toe raises 2 x 10 5 second hold              07/21/2021  Side lying hip abduction x 5                Supine:  bridge x 10                                SLR x 10        4/11: sidelying forearm push up exercises  7/11: Reviewed patient her HEP currently sit to stand at sink, walking with parents help and prior HEP All these prior given with new handouts below   Access Code: [email or web address] URL: [email or web address] Date:  10/24/2021 Prepared by: Jerilynn Som  Exercises - Seated Heel Toe Raises  - 2 x daily - 7 x weekly - 2 sets - 10 reps - Seated Long Arc Quad  - 2 x daily - 7 x weekly - 2 sets - 10 reps - Seated March  - 2 x daily - 7 x weekly - 2 sets - 10 reps - Seated Hamstring Stretch with Strap  - 1 x daily - 7 x weekly - 1 sets - 2 reps - 30 seconds hold - Supine Bridge  - 1 x daily - 7 x weekly - 3 sets - 10 reps - Supine March  - 1 x daily - 7 x weekly - 3 sets - 10 reps - Sit to Stand with Counter Support  - 1 x daily - 7 x weekly - 2 sets - 10 reps  Patient Education - Rolling From Your Back to Your Side - Bed Mobility     GOALS: Goals reviewed with patient? Yes   SHORT TERM GOALS: Target date: 10/14/2021   Patient  will be independent with HEP in order to improve functional outcomes. Baseline: 09/27/21:  Reports she has been pulling up and standing every hour, stands for one minute.  Pt unsure with current HEP program. Goal status: ongoing    2.  Patient will report at least 25% improvement in symptoms for improved quality of life. Baseline:  09/27/21: Reports improvements by 35%, improvements with mobility, gait, and strength. 11/30/2021 "no better" Goal status:met     LONG TERM GOALS: Target date: 10/27/2021   Patient will report at least 75% improvement in symptoms for improved quality of life. Baseline:  09/27/21: Reports improvements by 35%, improvements with mobility, gait, and strength.11/30/21 " no better" Goal status: ongoing    2.  Patient will be able to perform sit to stand without UE use in order to demonstrate improved functional strength. Baseline: 10/30/21 can stand up with left upper extremity assist with CGA. 09/27/21: Able to stand with Lt UE pushing from chair with CGA, increased fatigue requires min A  08/24/21 needs min to mod A today due to increased back pain; s Goal status: ongoing    3.  Patient will be able to stand/walk for 5 minutes with LRAD use for balance  in order to complete household tasks. Baseline: 6/14: Gait training Rt UE platform walker: 3 sets 1'43" 21f; 2'23" 239f 57" 21f521f/11/23 walking 5' and then 10' ft at recent session.  30 sec to 1 min at a time.  Goal status: ongoing        ASSESSMENT:   CLINICAL IMPRESSION: Shortened session due to patient arriving early and asking to be seen early.  Discharge visit today secondary to minimal progress. PT had an in depth conversation with patient about walking more, standing more and sitting up  more at home as she states she stays in bed a lot.  She verbalizes understanding.  Therapist instructed her on how to contact us [place] forward for further services.    OBJECTIVE IMPAIRMENTS Abnormal gait, decreased activity tolerance, decreased balance, decreased endurance, decreased knowledge of use of DME, decreased mobility, difficulty walking, decreased ROM, decreased strength, impaired flexibility, impaired UE functional use, improper body mechanics, and obesity.    ACTIVITY LIMITATIONS cleaning, community activity, driving, meal prep, occupation, laundry, yard work, shopping, and yard work.    PERSONAL FACTORS Behavior pattern, Fitness, Past/current experiences, Time since onset of injury/illness/exacerbation, and 3+ comorbidities: CVA, increased BMI, sedentary   are also affecting patient's functional outcome.      REHAB POTENTIAL: Good   CLINICAL DECISION MAKING: Evolving/moderate complexity   EVALUATION COMPLEXITY: Moderate   PLAN: PT FREQUENCY: 2x/week   PT DURATION: 4 weeks   PLANNED INTERVENTIONS: Therapeutic exercises, Therapeutic activity, Neuromuscular re-education, Balance training, Gait training, Patient/Family education, Joint manipulation, Joint mobilization, Stair training, Orthotic/Fit training, DME instructions, Aquatic Therapy, Dry Needling, Electrical stimulation, Spinal manipulation, Spinal mobilization, Cryotherapy, Moist heat, Compression bandaging, scar  mobilization, Splintting, Taping, Traction, Ultrasound, Ionotophoresis 4mg75m Dexamethasone, and Manual therapy    PLAN FOR NEXT SESSION: discharge   3:49 PM, 11/30/21 Nola Botkins Small Jazman Reuter MPT [facility] [place] #[id number] [phone]

## 2021-12-05 ENCOUNTER — Encounter (HOSPITAL_COMMUNITY): Payer: 59 | Admitting: Occupational Therapy

## 2021-12-05 NOTE — Progress Notes (Addendum)
 Assessment and Plan   1. CVD (cerebrovascular disease) (Primary) 2. History of CVA (cerebrovascular accident) 3. Essential hypertension 4. Paraparesis (*) 5. Urinary incontinence, unspecified type   MDM: Connie Shepard is being seen for a power wheelchair assessment.  I reviewed and agree with her PT mobility seating evaluation.  She has no current skin breakdown.  She is unable to do any of her ADLs.  She has the mental and physical ability to operate the power wheelchair and she is willing to use it in her home.  She is able to operate it with her left hand since she has right-sided paraparesis.  [name] is not compliant with her medications.  She did not take her blood pressure medication this morning and her blood pressure is very high.  ADDENDUM: [name] suffers from urinary incontinence and needs diapers.   Patient  verbalized to me that they understood what their problem is, what they need to do about it, and why it is important that they do it.  The patient/family voices understanding of all medications. No barriers to adherence were noted. Patient is taking all medications as prescribed and is tolerating well.  Plan for follow-up as discussed or as needed if any worsening symptoms or change in condition.  After Visit Summary was given to patient.    Risks, benefits, and alternatives of the medications and treatment plan prescribed today were discussed, and patient expressed understanding. Plan follow-up as discussed or as needed if any worsening symptoms or change in condition.    Subjective   Connie Shepard is a 56 y.o. (DOB March 31, 1966) female who presents for the following:    Patient presents with  . forms    For scooter   . Therapy    Continuation     HPI  Connie Shepard presents for power wheelchair evaluation.  She has a history of a CVA and right-sided paraparesis.  She needs a wheelchair in which she can use in the house and which she can direct with her left  hand.  Her blood pressure is significantly elevated and she admits to not taking her medication this morning.  She denies any chest pain or shortness of breath she denies any headaches or new neurological symptoms.  Review of Systems      Review of Systems  Constitutional: Negative.   Eyes: Negative.   Respiratory: Negative.   Cardiovascular: Negative.   Gastrointestinal: Negative.   Endocrine: Negative.   Genitourinary: Negative.   Musculoskeletal: Weakness Skin: Negative.   Neurological: Right-sided weakness in both the arm and the leg.   Psychiatric/Behavioral: Agitated  Objective   Vitals:   12/05/21 0949 12/05/21 0957  BP: (!) 216/136 (!) 218/128  Patient Position: Sitting   Pulse: 86   Temp: 97 F (36.1 C)   TempSrc: Temporal   Resp: 20   Height: 5' 3.5 (1.613 m)   Weight: 260 lb (117.9 kg)   SpO2: 95%   BMI (Calculated): 45.3   PainSc: 0-No pain     Physical Exam     Gen: Alert, oriented, non toxic, and well hydrated.  No signs of acute distress. HEENT: Normocephalic.  Atraumatic.  PERRLA.   Neck: Supple. No lymphadenopathy Respiratory:  No use of accessory muscles. Cardiovascular: Regular rate and rhythm.   Abdominal:  Soft, non tender, non distended.    Neuro: Cranial nerves intact grossly.  2 out of 5 strength in her right arm and right leg.  Decreased range of motion as well.  Extremities:  Full range of motion.  No cyanosis, clubbing, or edema. Skin:  No rashes noted Psych: Oriented, alert.  Agitated

## 2021-12-07 ENCOUNTER — Encounter (HOSPITAL_COMMUNITY): Payer: 59 | Admitting: Occupational Therapy

## 2022-03-13 ENCOUNTER — Ambulatory Visit (HOSPITAL_COMMUNITY): Payer: 59 | Admitting: Physical Therapy

## 2022-04-12 ENCOUNTER — Ambulatory Visit (HOSPITAL_COMMUNITY): Payer: 59 | Admitting: Physical Therapy

## 2022-04-12 NOTE — Therapy (Incomplete)
OUTPATIENT PHYSICAL THERAPY NEURO EVALUATION   [name] MRN: [id number] DOB:[birth year], 56 y.o., female Today's Date: 04/12/2022   PCP: Lia Hopping, MD REFERRING PROVIDER:  Diagnosis Description  PT eval/tx for hx of cva and paraparesis per Delano Metz, MD    END OF SESSION:   Past Medical History:  Diagnosis Date   Acute heart failure (HCC)    ADD (attention deficit disorder)    Bronchitis    Depression    Diabetes mellitus without complication (HCC)    Elevated troponin    Hypertension    Obesity    Past Surgical History:  Procedure Laterality Date   CESAREAN SECTION     Patient Active Problem List   Diagnosis Date Noted   Chronic combined systolic and diastolic congestive heart failure (HCC) 08/25/2020   Acute CVA (cerebrovascular accident) (HCC) 08/24/2020   Borderline prolonged QT interval 08/24/2020   Type 2 diabetes mellitus with complication, without long-term current use of insulin (HCC) 03/16/2020   Anasarca    Diabetes mellitus (HCC)    Hypokalemia    Vitamin D deficiency    Hypothyroidism    Physical deconditioning    Acute on chronic systolic CHF (congestive heart failure) (HCC) 03/09/2020   Acute on chronic systolic HF (heart failure) (HCC) 03/09/2020   Hyperglycemia 12/22/2018   Hypertensive crisis 04/04/2018   Acute CHF (congestive heart failure) (HCC) 04/04/2018   Mild renal insufficiency 04/04/2018   Elevated troponin 04/04/2018   Bronchitis    Obesity    Seborrheic dermatitis of scalp 05/23/2016   Essential hypertension 12/02/2015   Depression 12/02/2015   Primary insomnia 12/02/2015   Morbid obesity with body mass index of 50.0-59.9 in adult [facility]) 12/02/2015   Attention deficit hyperactivity disorder (ADHD), predominantly inattentive type 12/02/2015    ONSET DATE: ***  REFERRING DIAG:  Diagnosis Description  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  Rationale for Evaluation and Treatment: Rehabilitation  SUBJECTIVE:                                                                                                                                                                                             SUBJECTIVE STATEMENT: *** Pt accompanied by: {accompnied:27141}  PERTINENT HISTORY: ***  PAIN:  Are you having pain? {OPRCPAIN:27236}  PRECAUTIONS: {Therapy precautions:24002}  WEIGHT BEARING RESTRICTIONS: {Yes ***/No:24003}  FALLS: Has patient fallen in last 6 months? {fallsyesno:27318}  LIVING ENVIRONMENT: Lives with: {OPRC lives with:25569::"lives with their family"} Lives in: {Lives in:25570} Stairs: {opstairs:27293} Has following equipment at home: {Assistive devices:23999}  PLOF: {PLOF:24004}  PATIENT GOALS: ***  OBJECTIVE:   DIAGNOSTIC FINDINGS: ***  COGNITION: Overall cognitive status: {cognition:24006}   SENSATION: {sensation:27233}  COORDINATION: ***  EDEMA:  {edema:24020}  MUSCLE TONE: {LE tone:25568}  MUSCLE LENGTH: Hamstrings: Right *** deg; Left *** deg Thomas test: Right *** deg; Left *** deg  DTRs:  {DTR SITE:24025}  POSTURE: {posture:25561}  LOWER EXTREMITY ROM:     {AROM/PROM:27142}  Right Eval Left Eval  Hip flexion    Hip extension    Hip abduction    Hip adduction    Hip internal rotation    Hip external rotation    Knee flexion    Knee extension    Ankle dorsiflexion    Ankle plantarflexion    Ankle inversion    Ankle eversion     (Blank rows = not tested)  LOWER EXTREMITY MMT:    MMT Right Eval Left Eval  Hip flexion    Hip extension    Hip abduction    Hip adduction    Hip internal rotation    Hip external rotation    Knee flexion    Knee extension    Ankle dorsiflexion    Ankle plantarflexion    Ankle inversion    Ankle eversion    (Blank rows = not tested)  BED MOBILITY:  {Bed mobility:24027}  TRANSFERS: Assistive device utilized:  {Assistive devices:23999}  Sit to stand: {Levels of assistance:24026} Stand to sit: {Levels of assistance:24026} Chair to chair: {Levels of assistance:24026} Floor: {Levels of assistance:24026}  RAMP:  Level of Assistance: {Levels of assistance:24026} Assistive device utilized: {Assistive devices:23999} Ramp Comments: ***  CURB:  Level of Assistance: {Levels of assistance:24026} Assistive device utilized: {Assistive devices:23999} Curb Comments: ***  STAIRS: Level of Assistance: {Levels of assistance:24026} Stair Negotiation Technique: {Stair Technique:27161} with {Rail Assistance:27162} Number of Stairs: ***  Height of Stairs: ***  Comments: ***  GAIT: Gait pattern: {gait characteristics:25376} Distance walked: *** Assistive device utilized: {Assistive devices:23999} Level of assistance: {Levels of assistance:24026} Comments: ***  FUNCTIONAL TESTS:  {Functional tests:24029}  PATIENT SURVEYS:  {rehab surveys:24030}  TODAY'S TREATMENT:                                                                                                                              DATE: ***    PATIENT EDUCATION: Education details: *** Person educated: {Person educated:25204} Education method: {Education Method:25205} Education comprehension: {Education Comprehension:25206}  HOME EXERCISE PROGRAM: ***  GOALS: Goals reviewed with patient? No  SHORT TERM GOALS: Target date: ***  *** Baseline: Goal status: {GOALSTATUS:25110}  2.  *** Baseline:  Goal status: {GOALSTATUS:25110}  3.  *** Baseline:  Goal status: {GOALSTATUS:25110}  4.  *** Baseline:  Goal status: {GOALSTATUS:25110}  5.  *** Baseline:  Goal status: {GOALSTATUS:25110}  6.  *** Baseline:  Goal status: {GOALSTATUS:25110}  LONG TERM GOALS: Target date: ***  *** Baseline:  Goal status: {GOALSTATUS:25110}  2.  *** Baseline:  Goal status: {GOALSTATUS:25110}  3.  *** Baseline:  Goal status:  {GOALSTATUS:25110}  4.  *** Baseline:  Goal status: {GOALSTATUS:25110}  5.  *** Baseline:  Goal status: {GOALSTATUS:25110}  6.  *** Baseline:  Goal status: {GOALSTATUS:25110}  ASSESSMENT:  CLINICAL IMPRESSION: Patient is a *** y.o. *** who was seen today for physical therapy evaluation and treatment for ***.   OBJECTIVE IMPAIRMENTS: {opptimpairments:25111}.   ACTIVITY LIMITATIONS: {activitylimitations:27494}  PARTICIPATION LIMITATIONS: {participationrestrictions:25113}  PERSONAL FACTORS: {Personal factors:25162} are also affecting patient's functional outcome.   REHAB POTENTIAL: {rehabpotential:25112}  CLINICAL DECISION MAKING: {clinical decision making:25114}  EVALUATION COMPLEXITY: {Evaluation complexity:25115}  PLAN:  PT FREQUENCY: {rehab frequency:25116}  PT DURATION: {rehab duration:25117}  PLANNED INTERVENTIONS: Therapeutic exercises, Therapeutic activity, Neuromuscular re-education, Balance training, Gait training, Patient/Family education, Joint manipulation, Joint mobilization, Stair training, Orthotic/Fit training, DME instructions, Aquatic Therapy, Dry Needling, Electrical stimulation, Spinal manipulation, Spinal mobilization, Cryotherapy, Moist heat, Compression bandaging, scar mobilization, Splintting, Taping, Traction, Ultrasound, Ionotophoresis 4mg /ml Dexamethasone, and Manual therapy   PLAN FOR NEXT SESSION: Review HEP and goals   10:24 AM, 04/12/22 Edith Lord Small Paradise Vensel MPT [facility] [place] [phone] Ph:[phone]

## 2022-04-13 ENCOUNTER — Encounter (HOSPITAL_COMMUNITY): Payer: Self-pay

## 2022-04-13 ENCOUNTER — Other Ambulatory Visit: Payer: Self-pay

## 2022-04-13 ENCOUNTER — Ambulatory Visit (HOSPITAL_COMMUNITY): Payer: 59 | Attending: Family Medicine

## 2022-04-13 DIAGNOSIS — R269 Unspecified abnormalities of gait and mobility: Secondary | ICD-10-CM | POA: Diagnosis present

## 2022-04-13 DIAGNOSIS — I69359 Hemiplegia and hemiparesis following cerebral infarction affecting unspecified side: Secondary | ICD-10-CM | POA: Diagnosis present

## 2022-04-13 DIAGNOSIS — M6281 Muscle weakness (generalized): Secondary | ICD-10-CM | POA: Diagnosis present

## 2022-04-13 NOTE — Therapy (Signed)
OUTPATIENT PHYSICAL THERAPY NEURO EVALUATION   [name] MRN: [id number] DOB:02-May-1965, 56 y.o., female Today's Date: 04/13/2022   PCP: Toma Deiters, MD REFERRING PROVIDER: Toma Deiters, MD  END OF SESSION:  PT End of Session - 04/13/22 1512     Visit Number 1    Authorization Type Primary: [employer]; Secondary [place] Medicaid [employer]    Authorization Time Period TBD    Authorization - Visit Number 1    Authorization - Number of Visits 8    Progress Note Due on Visit 8    PT Start Time 1345    PT Stop Time 1430    PT Time Calculation (min) 45 min    Equipment Utilized During Treatment Gait belt;Other (comment)   hemi walker   Activity Tolerance Patient limited by fatigue    Behavior During Therapy [facility] for tasks assessed/performed             Past Medical History:  Diagnosis Date   Acute heart failure (HCC)    ADD (attention deficit disorder)    Bronchitis    Depression    Diabetes mellitus without complication (HCC)    Elevated troponin    Hypertension    Obesity    Past Surgical History:  Procedure Laterality Date   CESAREAN SECTION     Patient Active Problem List   Diagnosis Date Noted   Chronic combined systolic and diastolic congestive heart failure (HCC) 08/25/2020   Acute CVA (cerebrovascular accident) (HCC) 08/24/2020   Borderline prolonged QT interval 08/24/2020   Type 2 diabetes mellitus with complication, without long-term current use of insulin (HCC) 03/16/2020   Anasarca    Diabetes mellitus (HCC)    Hypokalemia    Vitamin D deficiency    Hypothyroidism    Physical deconditioning    Acute on chronic systolic CHF (congestive heart failure) (HCC) 03/09/2020   Acute on chronic systolic HF (heart failure) (HCC) 03/09/2020   Hyperglycemia 12/22/2018   Hypertensive crisis 04/04/2018   Acute CHF (congestive heart failure) (HCC) 04/04/2018   Mild renal insufficiency 04/04/2018   Elevated troponin 04/04/2018   Bronchitis     Obesity    Seborrheic dermatitis of scalp 05/23/2016   Essential hypertension 12/02/2015   Depression 12/02/2015   Primary insomnia 12/02/2015   Morbid obesity with body mass index of 50.0-59.9 in adult [facility]) 12/02/2015   Attention deficit hyperactivity disorder (ADHD), predominantly inattentive type 12/02/2015    ONSET DATE: 08/23/2020  REFERRING DIAG: Hx of CVA and Paraparesis  THERAPY DIAG:  Muscle weakness (generalized)  CVA, old, hemiparesis (HCC)  Abnormality of gait and mobility  Rationale for Evaluation and Treatment: Rehabilitation  SUBJECTIVE:  SUBJECTIVE STATEMENT: Pt has been to this clinic prior and reported having insurance difficulties maintaining appointments. Pt had a stroke in May '22, affecting her right side. Arnetra reports having difficulty with ADLs, including driving, dressing, and requiring assistant from mother and father. Gordon reports difficulty with functional transfers and not having tried any out of chair mobility independently. Cheyane reports her normal activities sitting in bed, watching TV.  Pt accompanied by: family member  PERTINENT HISTORY:  Essential hypertension;  Hypothyroidism due to Hashimoto's thyroiditis;  CVD (cerebrovascular disease);  History of CVA (cerebrovascular accident);  Type 2 diabetes mellitus with hyperglycemia   PAIN:  Are you having pain? No  PRECAUTIONS: None  WEIGHT BEARING RESTRICTIONS: No  FALLS: Has patient fallen in last 6 months? No  LIVING ENVIRONMENT: Lives with: lives with their family Lives in: House/apartment Stairs:  Ramp Has following equipment at home: Environmental consultant - 2 wheeled  PLOF: Needs assistance with ADLs, Needs assistance with homemaking, Needs assistance with gait, and Needs assistance with  transfers  PATIENT GOALS: "my arm and leg start to get better at least. Be able to walk"  OBJECTIVE:   COGNITION: Overall cognitive status: Within functional limits for tasks assessed and pt demonstrates aphasic like cognition intermittently with requiring increased time and misunderstanding of subjective questions.     SENSATION: WFL  COORDINATION: Finger to nose Pronation/supination Heel/shin Impaired unable to complete with RUE and RLE.    MUSCLE TONE: RLE: Within functional limits, Hypertonic, and Modifed Ashworth Scale 1 = Slight increase in muscle tone, manifested by a catch and release or by minimal resistance at the end of the range of motion when the affected part(s) is moved in flexion or extension   POSTURE: rounded shoulders, posterior pelvic tilt, and weight shift left  LOWER EXTREMITY ROM:     WFL  LOWER EXTREMITY MMT:    MMT Right Eval Left Eval  Hip flexion    Hip extension    Hip abduction 4- 4-  Hip adduction    Hip internal rotation    Hip external rotation    Knee flexion    Knee extension 3+ 4+  Ankle dorsiflexion 3+ 4+  Ankle plantarflexion    Ankle inversion    Ankle eversion    (Blank rows = not tested)  BED MOBILITY:  Sit to supine Modified independence Supine to sit Modified independence Rolling to Right Modified independence Rolling to Left Modified independence  TRANSFERS: Assistive device utilized: Hemi walker  Sit to stand: Min A Stand to sit: CGA Chair to chair: Min A Pt required increased motivation to complete chair/mat transfer with hemi walker in LUE with multiple stepto points to mat with pt reporting, "I need to sit down, my legs are going to fall out." Increased left trunk shift in standing   GAIT: Gait pattern:  Step to gait pattern leading with LLE and hemi walker with follow of RLE to upright standing. Intermittent toe drag with R foot, reduced. Reduce weight acceptance on R side and increased L trunk lean during  stance and swing phase. Reduced capacity with pt reporting increased fatigue, "I'm going to give out."  Distance walked: 64ft Assistive device utilized: Hemi walker Level of assistance: Min A Comments: reduced ambulatory capacity secondary due to reduced muscular endurance and strength.   FUNCTIONAL TESTS:  5 times sit to stand: 35 seconds 10 meter walk test: Not completed today Sitting Balance Test  5/7  PATIENT SURVEYS:  FOTO 28.5%  TODAY'S TREATMENT:  DATE: HEP and physical therapy neurological evaluation with education regarding practices, expectations and     PATIENT EDUCATION: Education details: Pt and pt's father given education regarding increased consistency and participation in HEP for prognosis of rehabilitation.  Person educated: Patient and Parent Education method: Explanation Education comprehension: verbalized understanding  HOME EXERCISE PROGRAM: STS practicing at home with supervision and assistance from family. Pulling self in WC with BLEs.  GOALS: Goals reviewed with patient? Yes  SHORT TERM GOALS: Target date: 05/11/22  Danylle and parents will be independent with HEP to demonstrate participation with PT POC.  Baseline: Goal status: INITIAL  2.  Latrecia will self report participation of > 10 minutes of functional activity at home to demonstrate participation with PT POC.  Baseline:  Goal status: INITIAL   LONG TERM GOALS: Target date: 06/08/22  Latrell will improve FOTO score > 10% to demonstrate increased functional mobility and reduced level of functional impairments. Baseline: 28.5% Goal status: INITIAL  2.  Vani will improve 5x STS to 22 seconds in order to demonstrate improve BLE muscular strength and improved functional activity tolerance.  Baseline: 35 seconds Goal status: INITIAL  3.  Sabrena will ambulate > 15 ft @ CGA  in order to demonstrate improved safety with ambulatory capacity and functional activity tolerance.  Baseline: 31ft @ MinA  Goal status: INITIAL  4.  Azusena will score 4-/5 on R side MMT to demonstrate improve muscular strength in order to demonstrate improved symmetrical functional movement patterns.  Baseline: 3+/5 on RLE in Knee extension and ankle DF.  Goal status: INITIAL  ASSESSMENT:  CLINICAL IMPRESSION: Pt is a pleasant 22 old female who is presenting to physical therapy today for R sided weakness secondary due to L CVA and R hemiparesis. Norita is referred to physical therapy by PCP for history of CVA. Reona is a prior patient at this clinic and was discharged  on 11/30/21 after a plateau in objective performance with moderate gains in functional mobility, ambulation rouhgly 20-71ft with hemi walking at CGA level.    Based upon today's evaluation, pt is demonstrating reduction at her functional baseline as noted by reduced ambulation capacity, reduced functional mobility and transfers, reduced ADL capacity requiring increased assistance for transfers, ambulation and ADLS as attributed to R sided muscle weakness, reduced activity tolerance, sitting and standing balance impairments and safety impairments dynamic activities. Daziah would benefit from skilled physical therapy services to address the above impairments and improve pt's safety during functional activities and reduce caregiver burden.    OBJECTIVE IMPAIRMENTS: Abnormal gait, decreased activity tolerance, decreased balance, decreased coordination, decreased mobility, difficulty walking, decreased ROM, decreased strength, impaired UE functional use, and obesity.   ACTIVITY LIMITATIONS: carrying, lifting, sitting, standing, squatting, stairs, transfers, reach over head, and locomotion level  PARTICIPATION LIMITATIONS: cleaning, laundry, driving, and community activity  PERSONAL FACTORS: Age, Behavior pattern, and Time since onset  of injury/illness/exacerbation are also affecting patient's functional outcome.   REHAB POTENTIAL: Fair Participation in HEP and fear of movement.   CLINICAL DECISION MAKING: Stable/uncomplicated  EVALUATION COMPLEXITY: Moderate  PLAN:  PT FREQUENCY: 1x/week  PT DURATION: 8 weeks  PLANNED INTERVENTIONS: Therapeutic exercises, Therapeutic activity, Neuromuscular re-education, Balance training, Gait training, Patient/Family education, Self Care, Joint mobilization, and DME instructions  PLAN FOR NEXT SESSION: 10 MWT; RLE strengthening; gait training   Nelida Meuse PT, DPT Physical Therapist with [facility] [phone] office  Nelida Meuse, PT 04/13/2022, 3:19 PM

## 2022-04-19 ENCOUNTER — Encounter (HOSPITAL_COMMUNITY): Payer: 59

## 2022-04-25 ENCOUNTER — Encounter (HOSPITAL_COMMUNITY): Payer: 59

## 2022-04-26 ENCOUNTER — Telehealth (HOSPITAL_COMMUNITY): Payer: Self-pay

## 2022-04-26 NOTE — Telephone Encounter (Signed)
1st no show; left message on voicemail regarding patient missed appointment yesterday and reminder for next appointment on 05/02/22.  8:52 AM, 04/26/22 Chani Ghanem Small Nyeemah Jennette MPT [facility] [place] [phone] BD:[phone]

## 2022-05-02 ENCOUNTER — Ambulatory Visit (HOSPITAL_COMMUNITY): Payer: Medicaid Other | Attending: Family Medicine

## 2022-05-02 DIAGNOSIS — R29898 Other symptoms and signs involving the musculoskeletal system: Secondary | ICD-10-CM | POA: Insufficient documentation

## 2022-05-02 DIAGNOSIS — R269 Unspecified abnormalities of gait and mobility: Secondary | ICD-10-CM | POA: Diagnosis present

## 2022-05-02 DIAGNOSIS — I69359 Hemiplegia and hemiparesis following cerebral infarction affecting unspecified side: Secondary | ICD-10-CM | POA: Diagnosis present

## 2022-05-02 DIAGNOSIS — M6281 Muscle weakness (generalized): Secondary | ICD-10-CM | POA: Insufficient documentation

## 2022-05-02 NOTE — Therapy (Signed)
OUTPATIENT PHYSICAL THERAPY NEURO EVALUATION   [name] MRN: [id number] DOB:[birth year], 57 y.o., female Today's Date: 05/02/2022   PCP: Toma Deiters, MD REFERRING PROVIDER: Toma Deiters, MD  END OF SESSION:  PT End of Session - 05/02/22 1432     Visit Number 2    Number of Visits 8    Date for PT Re-Evaluation 06/08/22    Authorization Type Primary: [employer]; Secondary [place] Medicaid [employer]    Authorization Time Period TBD    Authorization - Visit Number 2    Authorization - Number of Visits 8    Progress Note Due on Visit 8    PT Start Time 1433    PT Stop Time 1515    PT Time Calculation (min) 42 min    Equipment Utilized During Treatment Gait belt;Other (comment)   hemi walker   Activity Tolerance Patient limited by fatigue    Behavior During Therapy [facility] for tasks assessed/performed             Past Medical History:  Diagnosis Date   Acute heart failure (HCC)    ADD (attention deficit disorder)    Bronchitis    Depression    Diabetes mellitus without complication (HCC)    Elevated troponin    Hypertension    Obesity    Past Surgical History:  Procedure Laterality Date   CESAREAN SECTION     Patient Active Problem List   Diagnosis Date Noted   Chronic combined systolic and diastolic congestive heart failure (HCC) 08/25/2020   Acute CVA (cerebrovascular accident) (HCC) 08/24/2020   Borderline prolonged QT interval 08/24/2020   Type 2 diabetes mellitus with complication, without long-term current use of insulin (HCC) 03/16/2020   Anasarca    Diabetes mellitus (HCC)    Hypokalemia    Vitamin D deficiency    Hypothyroidism    Physical deconditioning    Acute on chronic systolic CHF (congestive heart failure) (HCC) 03/09/2020   Acute on chronic systolic HF (heart failure) (HCC) 03/09/2020   Hyperglycemia 12/22/2018   Hypertensive crisis 04/04/2018   Acute CHF (congestive heart failure) (HCC) 04/04/2018   Mild renal  insufficiency 04/04/2018   Elevated troponin 04/04/2018   Bronchitis    Obesity    Seborrheic dermatitis of scalp 05/23/2016   Essential hypertension 12/02/2015   Depression 12/02/2015   Primary insomnia 12/02/2015   Morbid obesity with body mass index of 50.0-59.9 in adult [facility]) 12/02/2015   Attention deficit hyperactivity disorder (ADHD), predominantly inattentive type 12/02/2015    ONSET DATE: 08/23/2020  REFERRING DIAG: Hx of CVA and Paraparesis  THERAPY DIAG:  Muscle weakness (generalized)  CVA, old, hemiparesis (HCC)  Abnormality of gait and mobility  Other symptoms and signs involving the musculoskeletal system  Rationale for Evaluation and Treatment: Rehabilitation  SUBJECTIVE:  SUBJECTIVE STATEMENT: Patient arrives in new WC today; states [employer] brought her chair yesterday and today is her first day in the chair.  States she has been "sitting too long"  Eval:Pt has been to this clinic prior and reported having insurance difficulties maintaining appointments. Pt had a stroke in May '22, affecting her right side. Connie Shepard reports having difficulty with ADLs, including driving, dressing, and requiring assistant from mother and father. Connie Shepard reports difficulty with functional transfers and not having tried any out of chair mobility independently. Connie Shepard reports her normal activities sitting in bed, watching TV.  Pt accompanied by: family member  PERTINENT HISTORY:  Essential hypertension;  Hypothyroidism due to Hashimoto's thyroiditis;  CVD (cerebrovascular disease);  History of CVA (cerebrovascular accident);  Type 2 diabetes mellitus with hyperglycemia   PAIN:  Are you having pain? No  PRECAUTIONS: None  WEIGHT BEARING RESTRICTIONS: No  FALLS: Has patient fallen in last 6 months?  No  LIVING ENVIRONMENT: Lives with: lives with their family Lives in: House/apartment Stairs:  Ramp Has following equipment at home: Environmental consultant - 2 wheeled  PLOF: Needs assistance with ADLs, Needs assistance with homemaking, Needs assistance with gait, and Needs assistance with transfers  PATIENT GOALS: "my arm and leg start to get better at least. Be able to walk"  OBJECTIVE:   COGNITION: Overall cognitive status: Within functional limits for tasks assessed and pt demonstrates aphasic like cognition intermittently with requiring increased time and misunderstanding of subjective questions.     SENSATION: WFL  COORDINATION: Finger to nose Pronation/supination Heel/shin Impaired unable to complete with RUE and RLE.    MUSCLE TONE: RLE: Within functional limits, Hypertonic, and Modifed Ashworth Scale 1 = Slight increase in muscle tone, manifested by a catch and release or by minimal resistance at the end of the range of motion when the affected part(s) is moved in flexion or extension   POSTURE: rounded shoulders, posterior pelvic tilt, and weight shift left  LOWER EXTREMITY ROM:     WFL  LOWER EXTREMITY MMT:    MMT Right Eval Left Eval  Hip flexion    Hip extension    Hip abduction 4- 4-  Hip adduction    Hip internal rotation    Hip external rotation    Knee flexion    Knee extension 3+ 4+  Ankle dorsiflexion 3+ 4+  Ankle plantarflexion    Ankle inversion    Ankle eversion    (Blank rows = not tested)  BED MOBILITY:  Sit to supine Modified independence Supine to sit Modified independence Rolling to Right Modified independence Rolling to Left Modified independence  TRANSFERS: Assistive device utilized: Hemi walker  Sit to stand: Min A Stand to sit: CGA Chair to chair: Min A Pt required increased motivation to complete chair/mat transfer with hemi walker in LUE with multiple stepto points to mat with pt reporting, "I need to sit down, my legs are going to  fall out." Increased left trunk shift in standing   GAIT: Gait pattern:  Step to gait pattern leading with LLE and hemi walker with follow of RLE to upright standing. Intermittent toe drag with R foot, reduced. Reduce weight acceptance on R side and increased L trunk lean during stance and swing phase. Reduced capacity with pt reporting increased fatigue, "I'm going to give out."  Distance walked: 30ft Assistive device utilized: Hemi walker Level of assistance: Min A Comments: reduced ambulatory capacity secondary due to reduced muscular endurance and strength.   FUNCTIONAL TESTS:  5  times sit to stand: 35 seconds 10 meter walk test: Not completed today Sitting Balance Test  5/7  PATIENT SURVEYS:  FOTO 28.5%  TODAY'S TREATMENT:                                                                                                                              DATE:  05/02/22 Review of HEP and goals Transfer from [facility] to mat using hemiwalker and CGA to min A x 2 x 1 ft Sit to stand x 5 from mat table Standing with hemi walker x 1 min and 20 sec and then 1 min and 40 sec CGA Ambulation with hemi walker and CGA x 6 ft and WC following and then another 5 ft  04/14/23 HEP and physical therapy neurological evaluation with education regarding practices, expectations and     PATIENT EDUCATION: Education details: Pt and pt's father given education regarding increased consistency and participation in HEP for prognosis of rehabilitation.  Person educated: Patient and Parent Education method: Explanation Education comprehension: verbalized understanding  HOME EXERCISE PROGRAM: STS practicing at home with supervision and assistance from family. Pulling self in WC with BLEs.  GOALS: Goals reviewed with patient? Yes  SHORT TERM GOALS: Target date: 05/11/22  Connie Shepard and parents will be independent with HEP to demonstrate participation with PT POC.  Baseline: Goal status: IN PROGRESS  2.  Connie Shepard  will self report participation of > 10 minutes of functional activity at home to demonstrate participation with PT POC.  Baseline:  Goal status: IN PROGRESS   LONG TERM GOALS: Target date: 06/08/22  Connie Shepard will improve FOTO score > 10% to demonstrate increased functional mobility and reduced level of functional impairments. Baseline: 28.5% Goal status: IN PROGRESS  2.  Connie Shepard will improve 5x STS to 22 seconds in order to demonstrate improve BLE muscular strength and improved functional activity tolerance.  Baseline: 35 seconds Goal status: IN PROGRESS  3.  Connie Shepard will ambulate > 15 ft @ CGA in order to demonstrate improved safety with ambulatory capacity and functional activity tolerance.  Baseline: 75ft @ MinA  Goal status: IN PROGRESS  4.  Connie Shepard will score 4-/5 on R side MMT to demonstrate improve muscular strength in order to demonstrate improved symmetrical functional movement patterns.  Baseline: 3+/5 on RLE in Knee extension and ankle DF.  Goal status: IN PROGRESS  ASSESSMENT:  CLINICAL IMPRESSION: Today's session started with a review of HEP and goals.  Patient verbalizes understanding and agreement with set rehab goals.  Patient is in a new motorized wheelchair on arrival today and needs cues to navigate safely as she is "still learning" to use it properly.  Sit to stand from mat table with CGA only; tends to lean to left side; decreased weightbearing on the right lower extremity.  Patient able to stand for over a minute without issue or loss of balance.  Ambulates with hemiwalker and CGA to ocassional min assist for balance with  WC following for safety.  Patient very anxious with walking; "do you have me??!" And states her right leg is "giving out".  Patient will benefit from continued skilled therapy services to address deficits and promote return to optimal function.      Eval:Pt is a pleasant 76 old female who is presenting to physical therapy today for R sided weakness  secondary due to L CVA and R hemiparesis. Connie Shepard is referred to physical therapy by PCP for history of CVA. Connie Shepard is a prior patient at this clinic and was discharged  on 11/30/21 after a plateau in objective performance with moderate gains in functional mobility, ambulation rouhgly 20-89ft with hemi walking at CGA level.    Based upon today's evaluation, pt is demonstrating reduction at her functional baseline as noted by reduced ambulation capacity, reduced functional mobility and transfers, reduced ADL capacity requiring increased assistance for transfers, ambulation and ADLS as attributed to R sided muscle weakness, reduced activity tolerance, sitting and standing balance impairments and safety impairments dynamic activities. Connie Shepard would benefit from skilled physical therapy services to address the above impairments and improve pt's safety during functional activities and reduce caregiver burden.    OBJECTIVE IMPAIRMENTS: Abnormal gait, decreased activity tolerance, decreased balance, decreased coordination, decreased mobility, difficulty walking, decreased ROM, decreased strength, impaired UE functional use, and obesity.   ACTIVITY LIMITATIONS: carrying, lifting, sitting, standing, squatting, stairs, transfers, reach over head, and locomotion level  PARTICIPATION LIMITATIONS: cleaning, laundry, driving, and community activity  PERSONAL FACTORS: Age, Behavior pattern, and Time since onset of injury/illness/exacerbation are also affecting patient's functional outcome.   REHAB POTENTIAL: Fair Participation in HEP and fear of movement.   CLINICAL DECISION MAKING: Stable/uncomplicated  EVALUATION COMPLEXITY: Moderate  PLAN:  PT FREQUENCY: 1x/week  PT DURATION: 8 weeks  PLANNED INTERVENTIONS: Therapeutic exercises, Therapeutic activity, Neuromuscular re-education, Balance training, Gait training, Patient/Family education, Self Care, Joint mobilization, and DME instructions  PLAN FOR NEXT  SESSION: 10 MWT; RLE strengthening; gait training   3:14 PM, 05/02/22 Ramir Malerba Small Lily Kernen MPT [facility] [place] [phone]

## 2022-05-09 ENCOUNTER — Ambulatory Visit (HOSPITAL_COMMUNITY): Payer: Medicaid Other

## 2022-05-09 DIAGNOSIS — M6281 Muscle weakness (generalized): Secondary | ICD-10-CM

## 2022-05-09 DIAGNOSIS — R29898 Other symptoms and signs involving the musculoskeletal system: Secondary | ICD-10-CM

## 2022-05-09 DIAGNOSIS — I69359 Hemiplegia and hemiparesis following cerebral infarction affecting unspecified side: Secondary | ICD-10-CM

## 2022-05-09 DIAGNOSIS — R269 Unspecified abnormalities of gait and mobility: Secondary | ICD-10-CM

## 2022-05-09 NOTE — Therapy (Signed)
OUTPATIENT PHYSICAL THERAPY NEURO TREATMENT   [name] MRN: [id number] DOB:11-Sep-1965, 57 y.o., female Today's Date: 05/09/2022   PCP: Neale Burly, MD REFERRING PROVIDER: Neale Burly, MD  END OF SESSION:  PT End of Session - 05/09/22 1437     Visit Number 3    Number of Visits 8    Date for PT Re-Evaluation 06/08/22    Authorization Type Primary: [place]; Secondary [place] Medicaid UHC    Authorization Time Period TBD    Authorization - Visit Number 3    Authorization - Number of Visits 8    Progress Note Due on Visit 8    PT Start Time 1435    PT Stop Time 1515    PT Time Calculation (min) 40 min    Equipment Utilized During Treatment Gait belt;Other (comment)   hemi walker   Activity Tolerance Patient limited by fatigue    Behavior During Therapy [facility] for tasks assessed/performed             Past Medical History:  Diagnosis Date   Acute heart failure ([place])    ADD (attention deficit disorder)    Bronchitis    Depression    Diabetes mellitus without complication (HCC)    Elevated troponin    Hypertension    Obesity    Past Surgical History:  Procedure Laterality Date   CESAREAN SECTION     Patient Active Problem List   Diagnosis Date Noted   Chronic combined systolic and diastolic congestive heart failure ([place]) 08/25/2020   Acute CVA (cerebrovascular accident) ([place]) 08/24/2020   Borderline prolonged QT interval 08/24/2020   Type 2 diabetes mellitus with complication, without long-term current use of insulin ([place]) 03/16/2020   Anasarca    Diabetes mellitus ([place])    Hypokalemia    Vitamin D deficiency    Hypothyroidism    Physical deconditioning    Acute on chronic systolic CHF (congestive heart failure) ([place]) 03/09/2020   Acute on chronic systolic HF (heart failure) ([place]) 03/09/2020   Hyperglycemia 12/22/2018   Hypertensive crisis 04/04/2018   Acute CHF (congestive heart failure) ([place]) 04/04/2018   Mild renal  insufficiency 04/04/2018   Elevated troponin 04/04/2018   Bronchitis    Obesity    Seborrheic dermatitis of scalp 05/23/2016   Essential hypertension 12/02/2015   Depression 12/02/2015   Primary insomnia 12/02/2015   Morbid obesity with body mass index of 50.0-59.9 in adult [facility]) 12/02/2015   Attention deficit hyperactivity disorder (ADHD), predominantly inattentive type 12/02/2015    ONSET DATE: 08/23/2020  REFERRING DIAG: Hx of CVA and Paraparesis  THERAPY DIAG:  Muscle weakness (generalized)  CVA, old, hemiparesis ([place])  Abnormality of gait and mobility  Other symptoms and signs involving the musculoskeletal system  Rationale for Evaluation and Treatment: Rehabilitation  SUBJECTIVE:  SUBJECTIVE STATEMENT: Patient arrives with her father; took transportation to get here today and is in her motorized chair; states she has "been sitting too long" and her bottom is numb.  " I'm not going to be able to come next week if they are going to make me wait this long" (regarding transportation) reports pain in her buttocks and hamstrings today 7/10.   Eval:Pt has been to this clinic prior and reported having insurance difficulties maintaining appointments. Pt had a stroke in May '22, affecting her right side. Connie Shepard reports having difficulty with ADLs, including driving, dressing, and requiring assistant from mother and father. Connie Shepard reports difficulty with functional transfers and not having tried any out of chair mobility independently. Connie Shepard reports her normal activities sitting in bed, watching TV.  Pt accompanied by: family member  PERTINENT HISTORY:  Essential hypertension;  Hypothyroidism due to Hashimoto's thyroiditis;  CVD (cerebrovascular disease);  History of CVA (cerebrovascular accident);   Type 2 diabetes mellitus with hyperglycemia   PAIN:  Are you having pain? No  PRECAUTIONS: None  WEIGHT BEARING RESTRICTIONS: No  FALLS: Has patient fallen in last 6 months? No  LIVING ENVIRONMENT: Lives with: lives with their family Lives in: House/apartment Stairs:  Ramp Has following equipment at home: Environmental consultant - 2 wheeled  PLOF: Needs assistance with ADLs, Needs assistance with homemaking, Needs assistance with gait, and Needs assistance with transfers  PATIENT GOALS: "my arm and leg start to get better at least. Be able to walk"  OBJECTIVE:   COGNITION: Overall cognitive status: Within functional limits for tasks assessed and pt demonstrates aphasic like cognition intermittently with requiring increased time and misunderstanding of subjective questions.     SENSATION: WFL  COORDINATION: Finger to nose Pronation/supination Heel/shin Impaired unable to complete with RUE and RLE.    MUSCLE TONE: RLE: Within functional limits, Hypertonic, and Modifed Ashworth Scale 1 = Slight increase in muscle tone, manifested by a catch and release or by minimal resistance at the end of the range of motion when the affected part(s) is moved in flexion or extension   POSTURE: rounded shoulders, posterior pelvic tilt, and weight shift left  LOWER EXTREMITY ROM:     WFL  LOWER EXTREMITY MMT:    MMT Right Eval Left Eval  Hip flexion    Hip extension    Hip abduction 4- 4-  Hip adduction    Hip internal rotation    Hip external rotation    Knee flexion    Knee extension 3+ 4+  Ankle dorsiflexion 3+ 4+  Ankle plantarflexion    Ankle inversion    Ankle eversion    (Blank rows = not tested)  BED MOBILITY:  Sit to supine Modified independence Supine to sit Modified independence Rolling to Right Modified independence Rolling to Left Modified independence  TRANSFERS: Assistive device utilized: Hemi walker  Sit to stand: Min A Stand to sit: CGA Chair to chair: Min A Pt  required increased motivation to complete chair/mat transfer with hemi walker in LUE with multiple stepto points to mat with pt reporting, "I need to sit down, my legs are going to fall out." Increased left trunk shift in standing   GAIT: Gait pattern:  Step to gait pattern leading with LLE and hemi walker with follow of RLE to upright standing. Intermittent toe drag with R foot, reduced. Reduce weight acceptance on R side and increased L trunk lean during stance and swing phase. Reduced capacity with pt reporting increased fatigue, "I'm going to  give out."  Distance walked: 35ft Assistive device utilized: Hemi walker Level of assistance: Min A Comments: reduced ambulatory capacity secondary due to reduced muscular endurance and strength.   FUNCTIONAL TESTS:  5 times sit to stand: 35 seconds 10 meter walk test: Not completed today Sitting Balance Test  5/7  PATIENT SURVEYS:  FOTO 28.5%  TODAY'S TREATMENT:                                                                                                                              DATE:  05/09/22 Ambulation with hemi walker and CGA x 6 ft with WC following Ambulation with hemi walker and CGA x 2 ft from WC to mat Sit to stand from mat table 2 x 5 using right arm on hemi walker  Sit to supine with min A  Supine: Bridge with A to secure legs x 7 SLR with A for right leg x 5 SLR with A for left leg x 10 Rolling on mat x 3 each way  Supine to sit mod A  Sitting LAQs x 10 Hip flexion x 10  Table to mat step pivot with hemiwalker with CGA/min A   05/02/22 Review of HEP and goals Transfer from [facility] to mat using hemiwalker and CGA to min A x 2 x 1 ft Sit to stand x 5 from mat table Standing with hemi walker x 1 min and 20 sec and then 1 min and 40 sec CGA Ambulation with hemi walker and CGA x 6 ft and WC following and then another 5 ft  04/14/23 HEP and physical therapy neurological evaluation with education regarding practices,  expectations and     PATIENT EDUCATION: Education details: Pt and pt's father given education regarding increased consistency and participation in [place] for prognosis of rehabilitation.  Person educated: Patient and Parent Education method: Explanation Education comprehension: verbalized understanding  HOME EXERCISE PROGRAM: STS practicing at home with supervision and assistance from family. Pulling self in WC with BLEs.  GOALS: Goals reviewed with patient? Yes  SHORT TERM GOALS: Target date: 05/11/22  Connie Shepard and parents will be independent with HEP to demonstrate participation with PT POC.  Baseline: Goal status: IN PROGRESS  2.  Connie Shepard will self report participation of > 10 minutes of functional activity at home to demonstrate participation with PT POC.  Baseline:  Goal status: IN PROGRESS   LONG TERM GOALS: Target date: 06/08/22  Connie Shepard will improve FOTO score > 10% to demonstrate increased functional mobility and reduced level of functional impairments. Baseline: 28.5% Goal status: IN PROGRESS  2.  Connie Shepard will improve 5x STS to 22 seconds in order to demonstrate improve BLE muscular strength and improved functional activity tolerance.  Baseline: 35 seconds Goal status: IN PROGRESS  3.  Connie Shepard will ambulate > 15 ft @ CGA in order to demonstrate improved safety with ambulatory capacity and functional activity tolerance.  Baseline: 61ft @ MinA  Goal status: IN PROGRESS  4.  Connie Shepard will score 4-/5 on R side MMT to demonstrate improve muscular strength in order to demonstrate improved symmetrical functional movement patterns.  Baseline: 3+/5 on RLE in Knee extension and ankle DF.  Goal status: IN PROGRESS  ASSESSMENT:  CLINICAL IMPRESSION: Today's session started with trying to ambulate in the gym with gait belt and WC following as being able to walk is patient's stated goal for therapy, however she is very anxious and yells loudly at the therapist that she cannot take any  further steps after only a short distance today.  Patient states she knows she says she wants to walk but she cannot today.   PT discusses with her at length about appropriate behavior in therapy however patient states she had to yell because the therapist "was not listening" .  Patient  needs min A for sit to supine and mod A for supine to sit.  Father states he has to help her up from laying most of the time. Patient tries to sit before she is in a safe position to do so and needs max encouragement for technique and safety.  Patient will benefit from continued skilled therapy services to address deficits and promote return to optimal function.      Eval:Pt is a pleasant 54 old female who is presenting to physical therapy today for R sided weakness secondary due to L CVA and R hemiparesis. Connie Shepard is referred to physical therapy by PCP for history of CVA. Connie Shepard is a prior patient at this clinic and was discharged  on 11/30/21 after a plateau in objective performance with moderate gains in functional mobility, ambulation rouhgly 20-21ft with hemi walking at CGA level.    Based upon today's evaluation, pt is demonstrating reduction at her functional baseline as noted by reduced ambulation capacity, reduced functional mobility and transfers, reduced ADL capacity requiring increased assistance for transfers, ambulation and ADLS as attributed to R sided muscle weakness, reduced activity tolerance, sitting and standing balance impairments and safety impairments dynamic activities. Connie Shepard would benefit from skilled physical therapy services to address the above impairments and improve pt's safety during functional activities and reduce caregiver burden.    OBJECTIVE IMPAIRMENTS: Abnormal gait, decreased activity tolerance, decreased balance, decreased coordination, decreased mobility, difficulty walking, decreased ROM, decreased strength, impaired UE functional use, and obesity.   ACTIVITY LIMITATIONS: carrying,  lifting, sitting, standing, squatting, stairs, transfers, reach over head, and locomotion level  PARTICIPATION LIMITATIONS: cleaning, laundry, driving, and community activity  PERSONAL FACTORS: Age, Behavior pattern, and Time since onset of injury/illness/exacerbation are also affecting patient's functional outcome.   REHAB POTENTIAL: Fair Participation in HEP and fear of movement.   CLINICAL DECISION MAKING: Stable/uncomplicated  EVALUATION COMPLEXITY: Moderate  PLAN:  PT FREQUENCY: 1x/week  PT DURATION: 8 weeks  PLANNED INTERVENTIONS: Therapeutic exercises, Therapeutic activity, Neuromuscular re-education, Balance training, Gait training, Patient/Family education, Self Care, Joint mobilization, and DME instructions  PLAN FOR NEXT SESSION: 10 MWT; RLE strengthening; gait training   3:26 PM, 05/09/22 Connie Shepard Small Ashtin Melichar MPT [facility] [place] [phone] [phone]

## 2022-05-16 ENCOUNTER — Ambulatory Visit (HOSPITAL_COMMUNITY): Payer: Medicaid Other

## 2022-05-16 DIAGNOSIS — R29898 Other symptoms and signs involving the musculoskeletal system: Secondary | ICD-10-CM

## 2022-05-16 DIAGNOSIS — M6281 Muscle weakness (generalized): Secondary | ICD-10-CM | POA: Diagnosis not present

## 2022-05-16 DIAGNOSIS — I69359 Hemiplegia and hemiparesis following cerebral infarction affecting unspecified side: Secondary | ICD-10-CM

## 2022-05-16 DIAGNOSIS — R269 Unspecified abnormalities of gait and mobility: Secondary | ICD-10-CM

## 2022-05-16 NOTE — Therapy (Signed)
OUTPATIENT PHYSICAL THERAPY NEURO TREATMENT   [name] MRN: [id number] DOB:Jan 24, 1966, 57 y.o., female Today's Date: 05/16/2022   PCP: Toma Deiters, MD REFERRING PROVIDER: Toma Deiters, MD  END OF SESSION:  PT End of Session - 05/16/22 1316     Visit Number 4    Number of Visits 8    Date for PT Re-Evaluation 06/08/22    Authorization Type Primary: [employer]; Secondary [place] [employer]    Authorization Time Period TBD    Authorization - Visit Number 4    Authorization - Number of Visits 8    Progress Note Due on Visit 8    PT Start Time 0130    PT Stop Time 0215    PT Time Calculation (min) 45 min    Equipment Utilized During Treatment Gait belt;Other (comment)   hemi walker            Past Medical History:  Diagnosis Date   Acute heart failure (HCC)    ADD (attention deficit disorder)    Bronchitis    Depression    Diabetes mellitus without complication (HCC)    Elevated troponin    Hypertension    Obesity    Past Surgical History:  Procedure Laterality Date   CESAREAN SECTION     Patient Active Problem List   Diagnosis Date Noted   Chronic combined systolic and diastolic congestive heart failure (HCC) 08/25/2020   Acute CVA (cerebrovascular accident) (HCC) 08/24/2020   Borderline prolonged QT interval 08/24/2020   Type 2 diabetes mellitus with complication, without long-term current use of insulin (HCC) 03/16/2020   Anasarca    Diabetes mellitus (HCC)    Hypokalemia    Vitamin D deficiency    Hypothyroidism    Physical deconditioning    Acute on chronic systolic CHF (congestive heart failure) (HCC) 03/09/2020   Acute on chronic systolic HF (heart failure) (HCC) 03/09/2020   Hyperglycemia 12/22/2018   Hypertensive crisis 04/04/2018   Acute CHF (congestive heart failure) (HCC) 04/04/2018   Mild renal insufficiency 04/04/2018   Elevated troponin 04/04/2018   Bronchitis    Obesity    Seborrheic dermatitis of scalp  05/23/2016   Essential hypertension 12/02/2015   Depression 12/02/2015   Primary insomnia 12/02/2015   Morbid obesity with body mass index of 50.0-59.9 in adult [facility]) 12/02/2015   Attention deficit hyperactivity disorder (ADHD), predominantly inattentive type 12/02/2015    ONSET DATE: 08/23/2020  REFERRING DIAG: Hx of CVA and Paraparesis  THERAPY DIAG:  Muscle weakness (generalized)  CVA, old, hemiparesis (HCC)  Abnormality of gait and mobility  Other symptoms and signs involving the musculoskeletal system  Rationale for Evaluation and Treatment: Rehabilitation  SUBJECTIVE:  SUBJECTIVE STATEMENT: Patient arrives with her father; tstates she has "been sitting too long" and her bottom is numb. " 7/10 pain buttocks     Eval:Pt has been to this clinic prior and reported having insurance difficulties maintaining appointments. Pt had a stroke in May '22, affecting her right side. Henryetta reports having difficulty with ADLs, including driving, dressing, and requiring assistant from mother and father. Sunny reports difficulty with functional transfers and not having tried any out of chair mobility independently. Salena reports her normal activities sitting in bed, watching TV.  Pt accompanied by: family member  PERTINENT HISTORY:  Essential hypertension;  Hypothyroidism due to Hashimoto's thyroiditis;  CVD (cerebrovascular disease);  History of CVA (cerebrovascular accident);  Type 2 diabetes mellitus with hyperglycemia   PAIN:  Are you having pain? No  PRECAUTIONS: None  WEIGHT BEARING RESTRICTIONS: No  FALLS: Has patient fallen in last 6 months? No  LIVING ENVIRONMENT: Lives with: lives with their family Lives in: House/apartment Stairs:  Ramp Has following equipment at home: Environmental consultant - 2  wheeled  PLOF: Needs assistance with ADLs, Needs assistance with homemaking, Needs assistance with gait, and Needs assistance with transfers  PATIENT GOALS: "my arm and leg start to get better at least. Be able to walk"  OBJECTIVE:   COGNITION: Overall cognitive status: Within functional limits for tasks assessed and pt demonstrates aphasic like cognition intermittently with requiring increased time and misunderstanding of subjective questions.     SENSATION: WFL  COORDINATION: Finger to nose Pronation/supination Heel/shin Impaired unable to complete with RUE and RLE.    MUSCLE TONE: RLE: Within functional limits, Hypertonic, and Modifed Ashworth Scale 1 = Slight increase in muscle tone, manifested by a catch and release or by minimal resistance at the end of the range of motion when the affected part(s) is moved in flexion or extension   POSTURE: rounded shoulders, posterior pelvic tilt, and weight shift left  LOWER EXTREMITY ROM:     WFL  LOWER EXTREMITY MMT:    MMT Right Eval Left Eval  Hip flexion    Hip extension    Hip abduction 4- 4-  Hip adduction    Hip internal rotation    Hip external rotation    Knee flexion    Knee extension 3+ 4+  Ankle dorsiflexion 3+ 4+  Ankle plantarflexion    Ankle inversion    Ankle eversion    (Blank rows = not tested)  BED MOBILITY:  Sit to supine Modified independence Supine to sit Modified independence Rolling to Right Modified independence Rolling to Left Modified independence  TRANSFERS: Assistive device utilized: Hemi walker  Sit to stand: Min A Stand to sit: CGA Chair to chair: Min A Pt required increased motivation to complete chair/mat transfer with hemi walker in LUE with multiple stepto points to mat with pt reporting, "I need to sit down, my legs are going to fall out." Increased left trunk shift in standing   GAIT: Gait pattern:  Step to gait pattern leading with LLE and hemi walker with follow of RLE to  upright standing. Intermittent toe drag with R foot, reduced. Reduce weight acceptance on R side and increased L trunk lean during stance and swing phase. Reduced capacity with pt reporting increased fatigue, "I'm going to give out."  Distance walked: 38ft Assistive device utilized: Hemi walker Level of assistance: Min A Comments: reduced ambulatory capacity secondary due to reduced muscular endurance and strength.   FUNCTIONAL TESTS:  5 times sit to stand: 35  seconds 10 meter walk test: Not completed today Sitting Balance Test  5/7  PATIENT SURVEYS:  FOTO 28.5%  TODAY'S TREATMENT:                                                                                                                              DATE:  05/16/22 Ambulation with hemi walker and WC following with CGA x 6 ft from WC to mat Sit to stand from mat table  x 5 with hand pushing up on hemi walker; x 5 with hand pushing up from the mat table Static standing 2 x 1' with hemi walker Sidestepping 2 steps with min A to the right and unable to the left Sit to stand with PT HHA x 4 Ambulation from mat back to WC x 5 ft with hemi walker and CGA/min A  Sitting: Hip abduction x 5 right leg; x 10 leg leg Marching with RTB 2 x 10    05/09/22 Ambulation with hemi walker and CGA x 6 ft with WC following Ambulation with hemi walker and CGA x 2 ft from WC to mat Sit to stand from mat table 2 x 5 using right arm on hemi walker  Sit to supine with min A  Supine: Bridge with A to secure legs x 7 SLR with A for right leg x 5 SLR with A for left leg x 10 Rolling on mat x 3 each way  Supine to sit mod A  Sitting LAQs x 10 Hip flexion x 10  Table to WC step pivot with hemiwalker with CGA/min A   05/02/22 Review of HEP and goals Transfer from [facility] to mat using hemiwalker and CGA to min A x 2 x 1 ft Sit to stand x 5 from mat table Standing with hemi walker x 1 min and 20 sec and then 1 min and 40 sec CGA Ambulation with  hemi walker and CGA x 6 ft and WC following and then another 5 ft  04/14/23 HEP and physical therapy neurological evaluation with education regarding practices, expectations and     PATIENT EDUCATION: Education details: Pt and pt's father given education regarding increased consistency and participation in HEP for prognosis of rehabilitation.  Person educated: Patient and Parent Education method: Explanation Education comprehension: verbalized understanding  HOME EXERCISE PROGRAM: 05/16/22 Access Code: [email or web address] URL: [email or web address] Date: 05/16/2022 Prepared by: AP - Rehab  Exercises - Sit to Stand with Counter Support  - 2 x daily - 7 x weekly - 2 sets - 5 reps - Seated March with Resistance  - 2 x daily - 7 x weekly - 2 sets - 10 reps - Seated Single Leg Hip Abduction  - 2 x daily - 7 x weekly - 1 sets - 10 reps - Supine Bridge  - 1 x daily - 7 x weekly - 3 sets - 10 reps  STS practicing at home with supervision and assistance from family. Pulling self in  WC with BLEs.  GOALS: Goals reviewed with patient? Yes  SHORT TERM GOALS: Target date: 05/11/22  Mikael and parents will be independent with HEP to demonstrate participation with PT POC.  Baseline: Goal status: IN PROGRESS  2.  Ed will self report participation of > 10 minutes of functional activity at home to demonstrate participation with PT POC.  Baseline:  Goal status: IN PROGRESS   LONG TERM GOALS: Target date: 06/08/22  Brandilee will improve FOTO score > 10% to demonstrate increased functional mobility and reduced level of functional impairments. Baseline: 28.5% Goal status: IN PROGRESS  2.  Michiko will improve 5x STS to 22 seconds in order to demonstrate improve BLE muscular strength and improved functional activity tolerance.  Baseline: 35 seconds Goal status: IN PROGRESS  3.  Shonya will ambulate > 15 ft @ CGA in order to demonstrate improved safety with ambulatory capacity and  functional activity tolerance.  Baseline: 45ft @ MinA  Goal status: IN PROGRESS  4.  Charm will score 4-/5 on R side MMT to demonstrate improve muscular strength in order to demonstrate improved symmetrical functional movement patterns.  Baseline: 3+/5 on RLE in Knee extension and ankle DF.  Goal status: IN PROGRESS  ASSESSMENT:  CLINICAL IMPRESSION: Today's session continued to focus on sit to stand transfer, ambulation, standing balance.  Good challenge with sit to stand pushing up from mat table; patient fatigues quickly and requests frequent rest breaks. Unable to side step today to the left; cannot bear weight on the right to soley lift right leg laterally; can bring abduct right leg slightly in standing and slide her left foot over; added hip abduction in sitting to try to address hip abduction weaknesss; decreased control right lower extremity.  Patient continues with high anxiety with standing and walking wanting constant reassurance that PT will not let her go.   Discussed with patient the importance of performing exercises at home daily.  Updated HEP and issued patient Red theraband for home use.    Patient will benefit from continued skilled therapy services to address deficits and promote return to optimal function.      Eval:Pt is a pleasant 62 old female who is presenting to physical therapy today for R sided weakness secondary due to L CVA and R hemiparesis. Massiah is referred to physical therapy by PCP for history of CVA. Jodiann is a prior patient at this clinic and was discharged  on 11/30/21 after a plateau in objective performance with moderate gains in functional mobility, ambulation rouhgly 20-46ft with hemi walking at CGA level.    Based upon today's evaluation, pt is demonstrating reduction at her functional baseline as noted by reduced ambulation capacity, reduced functional mobility and transfers, reduced ADL capacity requiring increased assistance for transfers, ambulation  and ADLS as attributed to R sided muscle weakness, reduced activity tolerance, sitting and standing balance impairments and safety impairments dynamic activities. Katasha would benefit from skilled physical therapy services to address the above impairments and improve pt's safety during functional activities and reduce caregiver burden.    OBJECTIVE IMPAIRMENTS: Abnormal gait, decreased activity tolerance, decreased balance, decreased coordination, decreased mobility, difficulty walking, decreased ROM, decreased strength, impaired UE functional use, and obesity.   ACTIVITY LIMITATIONS: carrying, lifting, sitting, standing, squatting, stairs, transfers, reach over head, and locomotion level  PARTICIPATION LIMITATIONS: cleaning, laundry, driving, and community activity  PERSONAL FACTORS: Age, Behavior pattern, and Time since onset of injury/illness/exacerbation are also affecting patient's functional outcome.   REHAB POTENTIAL: Rafael Gonzalez Participation  in HEP and fear of movement.   CLINICAL DECISION MAKING: Stable/uncomplicated  EVALUATION COMPLEXITY: Moderate  PLAN:  PT FREQUENCY: 1x/week  PT DURATION: 8 weeks  PLANNED INTERVENTIONS: Therapeutic exercises, Therapeutic activity, Neuromuscular re-education, Balance training, Gait training, Patient/Family education, Self Care, Joint mobilization, and DME instructions  PLAN FOR NEXT SESSION: RLE strengthening; gait training; standing balance, transfers, bed mobility   2:21 PM, 05/16/22 [place] Zarazua Small Sayda Grable MPT [facility] [place] [phone] RU:[phone]

## 2022-05-23 ENCOUNTER — Encounter (HOSPITAL_COMMUNITY): Payer: 59

## 2022-05-25 ENCOUNTER — Telehealth (HOSPITAL_COMMUNITY): Payer: Self-pay

## 2022-05-25 NOTE — Telephone Encounter (Signed)
No show #2; Called patient's home number and spoke with patient's father about no show this past Wednesday. He stated patient just did not feel well. Encouraged him to call if she is not going to attend therapy so that she won't be a no show.  He verbalizes understanding.  9:39 AM, 05/25/22 Connie Shepard Fishel Wamble MPT [facility] [place] [phone]

## 2022-05-30 ENCOUNTER — Encounter (HOSPITAL_COMMUNITY): Payer: 59

## 2022-06-06 ENCOUNTER — Ambulatory Visit (HOSPITAL_COMMUNITY): Payer: Medicaid Other | Attending: Family Medicine

## 2022-06-06 DIAGNOSIS — R269 Unspecified abnormalities of gait and mobility: Secondary | ICD-10-CM | POA: Diagnosis present

## 2022-06-06 DIAGNOSIS — M6281 Muscle weakness (generalized): Secondary | ICD-10-CM | POA: Diagnosis present

## 2022-06-06 DIAGNOSIS — I69359 Hemiplegia and hemiparesis following cerebral infarction affecting unspecified side: Secondary | ICD-10-CM | POA: Diagnosis present

## 2022-06-06 NOTE — Therapy (Addendum)
OUTPATIENT PHYSICAL THERAPY NEURO TREATMENT/ Progress note/DISCHARGE Progress Note Reporting Period 04/13/2022 to 06/06/2022  See note below for Objective Data and Assessment of Progress/Goals.     PHYSICAL THERAPY DISCHARGE SUMMARY  Visits from Start of Care: 5  Current functional level related to goals / functional outcomes: See below   Remaining deficits: See below   Education / Equipment: See below   Patient agrees to discharge. Patient goals were not met. Patient is being discharged due to lack of progress.       Patient Name: Noreen Garfinkle MRN: [id number] DOB:[birth year], 57 y.o., female 40 Date: 06/06/2022   PCP: Neale Burly, MD REFERRING PROVIDER: Neale Burly, MD  END OF SESSION:  PT End of Session - 06/06/22 1305     Visit Number 5    Number of Visits 8    Date for PT Re-Evaluation 06/08/22    Authorization Type Primary: [place]; Secondary [place] Medicaid UHC    Authorization Time Period TBD    Authorization - Visit Number 5    Authorization - Number of Visits 8    Progress Note Due on Visit 8    PT Start Time 0105    PT Stop Time 0145    PT Time Calculation (min) 40 min    Equipment Utilized During Treatment Gait belt;Other (comment)   hemi walker   Activity Tolerance Patient limited by fatigue    Behavior During Therapy [facility] for tasks assessed/performed             Past Medical History:  Diagnosis Date   Acute heart failure ([place])    ADD (attention deficit disorder)    Bronchitis    Depression    Diabetes mellitus without complication (HCC)    Elevated troponin    Hypertension    Obesity    Past Surgical History:  Procedure Laterality Date   CESAREAN SECTION     Patient Active Problem List   Diagnosis Date Noted   Chronic combined systolic and diastolic congestive heart failure ([place]) 08/25/2020   Acute CVA (cerebrovascular accident) ([place]) 08/24/2020   Borderline prolonged QT interval 08/24/2020   Type 2 diabetes  mellitus with complication, without long-term current use of insulin ([place]) 03/16/2020   Anasarca    Diabetes mellitus ([place])    Hypokalemia    Vitamin D deficiency    Hypothyroidism    Physical deconditioning    Acute on chronic systolic CHF (congestive heart failure) ([place]) 03/09/2020   Acute on chronic systolic HF (heart failure) ([place]) 03/09/2020   Hyperglycemia 12/22/2018   Hypertensive crisis 04/04/2018   Acute CHF (congestive heart failure) ([place]) 04/04/2018   Mild renal insufficiency 04/04/2018   Elevated troponin 04/04/2018   Bronchitis    Obesity    Seborrheic dermatitis of scalp 05/23/2016   Essential hypertension 12/02/2015   Depression 12/02/2015   Primary insomnia 12/02/2015   Morbid obesity with body mass index of 50.0-59.9 in adult [facility]) 12/02/2015   Attention deficit hyperactivity disorder (ADHD), predominantly inattentive type 12/02/2015    ONSET DATE: 08/23/2020  REFERRING DIAG: Hx of CVA and Paraparesis  THERAPY DIAG:  Muscle weakness (generalized)  CVA, old, hemiparesis ([place])  Abnormality of gait and mobility  Rationale for Evaluation and Treatment: Rehabilitation  SUBJECTIVE:  SUBJECTIVE STATEMENT: Patient states she missed last appointment due to not feeling well; did some walking at home the day before yesterday; no pain today; her mom is with her today.      Eval:Pt has been to this clinic prior and reported having insurance difficulties maintaining appointments. Pt had a stroke in May '22, affecting her right side. Ariadna reports having difficulty with ADLs, including driving, dressing, and requiring assistant from mother and father. Vester reports difficulty with functional transfers and not having tried any out of chair mobility independently. Davinah reports her  normal activities sitting in bed, watching TV.  Pt accompanied by: family member  PERTINENT HISTORY:  Essential hypertension;  Hypothyroidism due to Hashimoto's thyroiditis;  CVD (cerebrovascular disease);  History of CVA (cerebrovascular accident);  Type 2 diabetes mellitus with hyperglycemia   PAIN:  Are you having pain? No  PRECAUTIONS: None  WEIGHT BEARING RESTRICTIONS: No  FALLS: Has patient fallen in last 6 months? No  LIVING ENVIRONMENT: Lives with: lives with their family Lives in: House/apartment Stairs:  Ramp Has following equipment at home: Environmental consultant - 2 wheeled  PLOF: Needs assistance with ADLs, Needs assistance with homemaking, Needs assistance with gait, and Needs assistance with transfers  PATIENT GOALS: "my arm and leg start to get better at least. Be able to walk"  OBJECTIVE:   COGNITION: Overall cognitive status: Within functional limits for tasks assessed and pt demonstrates aphasic like cognition intermittently with requiring increased time and misunderstanding of subjective questions.     SENSATION: WFL  COORDINATION: Finger to nose Pronation/supination Heel/shin Impaired unable to complete with RUE and RLE.    MUSCLE TONE: RLE: Within functional limits, Hypertonic, and Modifed Ashworth Scale 1 = Slight increase in muscle tone, manifested by a catch and release or by minimal resistance at the end of the range of motion when the affected part(s) is moved in flexion or extension   POSTURE: rounded shoulders, posterior pelvic tilt, and weight shift left  LOWER EXTREMITY ROM:     WFL  LOWER EXTREMITY MMT:    MMT Right Eval Left Eval Right 06/06/22 Left 06/06/22  Hip flexion      Hip extension      Hip abduction 4- 4-    Hip adduction      Hip internal rotation      Hip external rotation      Knee flexion      Knee extension 3+ 4+ 4- 4+  Ankle dorsiflexion 3+ 4+ 3+ 5  Ankle plantarflexion      Ankle inversion      Ankle eversion       (Blank rows = not tested)  BED MOBILITY:  Sit to supine Modified independence Supine to sit Modified independence Rolling to Right Modified independence Rolling to Left Modified independence  TRANSFERS: Assistive device utilized: Hemi walker  Sit to stand: Min A Stand to sit: CGA Chair to chair: Min A Pt required increased motivation to complete chair/mat transfer with hemi walker in LUE with multiple stepto points to mat with pt reporting, "I need to sit down, my legs are going to fall out." Increased left trunk shift in standing   GAIT: Gait pattern:  Step to gait pattern leading with LLE and hemi walker with follow of RLE to upright standing. Intermittent toe drag with R foot, reduced. Reduce weight acceptance on R side and increased L trunk lean during stance and swing phase. Reduced capacity with pt reporting increased fatigue, "I'm going to give out."  Distance walked: 18f Assistive device utilized: Hemi walker Level of assistance: Min A Comments: reduced ambulatory capacity secondary due to reduced muscular endurance and strength.   FUNCTIONAL TESTS:  5 times sit to stand: 35 seconds 10 meter walk test: Not completed today Sitting Balance Test  5/7  PATIENT SURVEYS:  FOTO 28.5%  TODAY'S TREATMENT:                                                                                                                              DATE:  06/06/22 Progress note FOTO 27 (CVA upper extremity) LEFS 8/80- 10% Sit to stand x 2 with hemi walker and min A 5 times sit to stand 59 sec from manual chair with occassional min A and using hemi walker to push up to standing Walking with hemi walker and WC following 5 ft rest then 3 ft     05/16/22 Ambulation with hemi walker and WC following with CGA x 6 [facility] Sit to stand from mat table  x 5 with hand pushing up on hemi walker; x 5 with hand pushing up from the mat table Static standing 2 x 1' with hemi walker Sidestepping 2  steps with min A to the right and unable to the left Sit to stand with PT HHA x 4 Ambulation from mat back to WC x 5 ft with hemi walker and CGA/min A  Sitting: Hip abduction x 5 right leg; x 10 leg leg Marching with RTB 2 x 10    05/09/22 Ambulation with hemi walker and CGA x 6 ft with WC following Ambulation with hemi walker and CGA x 2 ft from WC to mat Sit to stand from mat table 2 x 5 using right arm on hemi walker  Sit to supine with min A  Supine: Bridge with A to secure legs x 7 SLR with A for right leg x 5 SLR with A for left leg x 10 Rolling on mat x 3 each way  Supine to sit mod A  Sitting LAQs x 10 Hip flexion x 10  Table to WC step pivot with hemiwalker with CGA/min A   05/02/22 Review of HEP and goals [facility] using hemiwalker and CGA to min A x 2 x 1 ft Sit to stand x 5 from mat table Standing with hemi walker x 1 min and 20 sec and then 1 min and 40 sec CGA Ambulation with hemi walker and CGA x 6 ft and WC following and then another 5 ft  04/14/23 HEP and physical therapy neurological evaluation with education regarding practices, expectations and     PATIENT EDUCATION: Education details: Pt and pt's father given education regarding increased consistency and participation in HEP for prognosis of rehabilitation.  Person educated: Patient and Parent Education method: Explanation Education comprehension: verbalized understanding  HOME EXERCISE PROGRAM: 05/16/22 Access Code: [email or web address] URL: [email or web address] Date: 05/16/2022 Prepared by: AP -  Rehab  Exercises - Sit to Stand with Counter Support  - 2 x daily - 7 x weekly - 2 sets - 5 reps - Seated March with Resistance  - 2 x daily - 7 x weekly - 2 sets - 10 reps - Seated Single Leg Hip Abduction  - 2 x daily - 7 x weekly - 1 sets - 10 reps - Supine Bridge  - 1 x daily - 7 x weekly - 3 sets - 10 reps  STS practicing at home with supervision and assistance from  family. Pulling self in WC with BLEs.  GOALS: Goals reviewed with patient? Yes  SHORT TERM GOALS: Target date: 05/11/22  Bhakti and parents will be independent with HEP to demonstrate participation with PT POC.  Baseline: Goal status: IN PROGRESS  2.  Jakerria will self report participation of > 10 minutes of functional activity at home to demonstrate participation with PT POC.  Baseline: 10 minutes and needs a rest Goal status: IN PROGRESS   LONG TERM GOALS: Target date: 06/08/22  Rikki will improve FOTO score > 10% to demonstrate increased functional mobility and reduced level of functional impairments. Baseline: 28.5%;  Goal status: IN PROGRESS  2.  Hilliary will improve 5x STS to 22 seconds in order to demonstrate improve BLE muscular strength and improved functional activity tolerance.  Baseline: 35 seconds; 59 today 06/06/22 Goal status: IN PROGRESS  3.  Adelene will ambulate > 15 ft @ CGA in order to demonstrate improved safety with ambulatory capacity and functional activity tolerance.  Baseline: 70f @ MinA; 3 ft then 5 ft with hemiwalker and CGA Goal status: IN PROGRESS  4.  DJammywill score 4-/5 on R side MMT to demonstrate improve muscular strength in order to demonstrate improved symmetrical functional movement patterns.  Baseline: 3+/5 on RLE in Knee extension and ankle DF.  Goal status: IN PROGRESS  ASSESSMENT:  CLINICAL IMPRESSION:   Progress note today. Discussed with patient the importance of performing exercises at home daily as she states she is exercising at home but she has not been able to progress here in therapy with her walking or with her sit to stand transfer.  Discussed with her having to show progress with therapy in order to justify continued therapy and she has regressed.  Talked with her and her mother at length about possible home therapy as it is very difficult for her to get here and really being dedicated to her HEP.  She states " I just don't care  anymore; I'm not going to get better".    Agreeable to discharge today   Eval:Pt is a pleasant 89old female who is presenting to physical therapy today for R sided weakness secondary due to L CVA and R hemiparesis. DShifrais referred to physical therapy by PCP for history of CVA. DKaylanis a prior patient at this clinic and was discharged  on 11/30/21 after a plateau in objective performance with moderate gains in functional mobility, ambulation rouhgly 20-342fwith hemi walking at CGA level.    Based upon today's evaluation, pt is demonstrating reduction at her functional baseline as noted by reduced ambulation capacity, reduced functional mobility and transfers, reduced ADL capacity requiring increased assistance for transfers, ambulation and ADLS as attributed to R sided muscle weakness, reduced activity tolerance, sitting and standing balance impairments and safety impairments dynamic activities. DeBrittonyould benefit from skilled physical therapy services to address the above impairments and improve pt's safety during functional activities and  reduce caregiver burden.    OBJECTIVE IMPAIRMENTS: Abnormal gait, decreased activity tolerance, decreased balance, decreased coordination, decreased mobility, difficulty walking, decreased ROM, decreased strength, impaired UE functional use, and obesity.   ACTIVITY LIMITATIONS: carrying, lifting, sitting, standing, squatting, stairs, transfers, reach over head, and locomotion level  PARTICIPATION LIMITATIONS: cleaning, laundry, driving, and community activity  PERSONAL FACTORS: Age, Behavior pattern, and Time since onset of injury/illness/exacerbation are also affecting patient's functional outcome.   REHAB POTENTIAL: Fair Participation in HEP and fear of movement.   CLINICAL DECISION MAKING: Stable/uncomplicated  EVALUATION COMPLEXITY: Moderate  PLAN:  PT FREQUENCY: 1x/week  PT DURATION: 8 weeks  PLANNED INTERVENTIONS: Therapeutic exercises,  Therapeutic activity, Neuromuscular re-education, Balance training, Gait training, Patient/Family education, Self Care, Joint mobilization, and DME instructions  PLAN FOR NEXT SESSION: discharge  2:43 PM, 06/06/22 Ahsha Hinsley Small Salayah Meares MPT [facility] [place] [phone] [id number]

## 2023-08-01 NOTE — Progress Notes (Signed)
 History of Present Illness The patient is a 58 year old female who presents for evaluation of diabetes, hypertension, and cerebrovascular disease with a history of stroke. She is noncompliant with her medications, and her blood pressure is in the urgent to emergent range. She is accompanied by her parents.  Her parents report that she is inconsistent with her medication intake, often skipping days. Despite efforts to incorporate the medication into her food, she remains resistant. Frequent agitation is noted. She demonstrates an understanding of the consequences of not adhering to her medication regimen, including the potential for elevated blood pressure.  Vitals:   07/30/23 1432  BP: (!) 220/150  Pulse: 94  Resp: 20  Temp: 97 F (36.1 C)  SpO2: 95%   Gen: Alert, oriented, non toxic, and well hydrated.  No signs of acute distress. HEENT: Normocephalic.  Atraumatic.  PERRLA.   Neck: Supple. No lymphadenopathy Respiratory:  No use of accessory muscles. Cardiovascular: Regular rate and rhythm.   Abdominal:  Soft, non tender, non distended.    Neuro: Cranial nerves intact grossly.  No loss of strength, sensation MS:  wheelchair bound Skin:  No rashes noted Psych: Oriented, alert.  Assessment & Plan  1. Type 2 diabetes mellitus with hyperglycemia, without long-term current use of insulin  (*)  POCT Hemoglobin A1C   CANCELED: POCT ACR URINE    2. CVA, old, hemiparesis (*)      3. Obesity, morbid (*)      4. Hypothyroidism due to Hashimoto's thyroiditis      5. Essential hypertension  Comprehensive Metabolic Panel   Comprehensive Metabolic Panel   CANCELED: POCT ACR URINE     MDM:   1. Hypertension. - Blood pressure is in the urgent to emergent range due to noncompliance with antihypertensive medications. - [name] report she does not take her medication every day and is often in an agitated mood. - Discussed the dangers of not taking medication, including death, and  the importance of compliance. - Blood work will be conducted today to assess current health status.  Patient  verbalized to me that they understood what their problem is, what they need to do about it, and why it is important that they do it.  The patient/family voices understanding of all medications. No barriers to adherence were noted. Patient is taking all medications as prescribed and is tolerating well.  Plan for follow-up as discussed or as needed if any worsening symptoms or change in condition.

## 2023-10-01 NOTE — Progress Notes (Signed)
 Spoke with Patient's guardian today to discuss appointments and health maintenance screenings due for the upcoming year.     Appointments which have been scheduled    Oct 22, 2023 4:30 PM Office Visit with Coye Connie Horseman, MD [facility] (--) [address] [place] [id number] [phone]       Care connections to place referral for N/A.  Health maintenance topics discussed and/or scheduled: Referral to Gastroenterology, Referral to Ophthalmology (diabetic eye exam), Referral to Gynecology (PAP), and Mammogram  Visits scheduled: mammogram  Comments: sch mammo w pt guardian -placed gastro ref -will call to get pt set up with eye exam -pap will put in appt notes due.

## 2023-11-05 NOTE — Progress Notes (Signed)
 In the last 180 days, a Care Connection Specialist from [facility] Value Based Care Coordination program reviewed the patient's chart and reached out to the patient to identify any gaps in preventative and routine health maintenance care.

## 2023-12-02 NOTE — Progress Notes (Signed)
 History of Present Illness The patient is a 58 year old female who presents to follow up on her type 2 diabetes and uncontrolled hypertension. She has a history of a cerebrovascular accident (CVA) and has paraparesis.  Her blood pressure has been elevated three out of five times. She reports that the medication she is currently taking is causing stomach upset, leading to vomiting. This has occurred three times in the past few weeks.  She mentions that she has been taking more than the prescribed dosage of one of her medications, resulting in only three pills remaining. She explains that she cannot sleep for more than three hours without waking up to take a pill. Additionally, she notes that her arthritis symptoms have been worsening, particularly affecting her elbow.  Social History: Sleep: She reports sleeping no more than three hours at a time.  Vitals:   12/02/23 1704  BP: (!) 228/142  Pulse:   Resp:   Temp:   SpO2:    Gen: Alert, oriented, non toxic, and well hydrated.  No signs of acute distress. HEENT: Normocephalic.  Atraumatic.  PERRLA.   Neck: Supple. No lymphadenopathy Respiratory:  No use of accessory muscles. Cardiovascular: Regular rate and rhythm.   Abdominal:  Soft, non tender, non distended.    Neuro: Cranial nerves intact grossly.  No loss of strength, sensation MS:  Full range of motion.  No cyanosis, clubbing, or edema. Skin:  No rashes noted Psych: Oriented, alert.  Assessment & Plan  1. Type 2 diabetes mellitus with complication, without long-term current use of insulin  (*)  POCT Hemoglobin A1C    2. Hypertensive urgency      3. Noncompliance       MDM:   1. Type 2 diabetes: - Her father has been trying to administer her medications, but she has been vomiting them up recently. It is recommended to use Zofran  15 to 30 minutes before taking her medications to help prevent vomiting. - Zofran  prescribed to be taken 15 to 30 minutes before her  medications.  2. Hypertension: - Her blood pressure remains uncontrolled due to noncompliance with medication. Recommend she go to the hospital and she refuses - It is recommended to use Zofran  15 to 30 minutes before taking her antihypertensives to help ensure the medications are retained. - Zofran  prescribed to be taken 15 to 30 minutes before her antihypertensives.  3. She has been warned that she is going to have a heart or stroke if she does not take her medications and she is resistant to doing anything for her health. I recommend she go to the hospital for her hypertensive urgency; she refuses.   Patient  verbalized to me that they understood what their problem is, what they need to do about it, and why it is important that they do it.  The patient/family voices understanding of all medications. No barriers to adherence were noted. Patient is taking all medications as prescribed and is tolerating well.  Plan for follow-up as discussed or as needed if any worsening symptoms or change in condition.

## 2023-12-02 NOTE — Progress Notes (Signed)
 Diabetic foot exam:  Left: Monofilament test: Sensation abnormal  Pulses: diminshed  Skin: Normal and no erythema, no cyanosis or pallor   Other findings: none Right: Monofilament test: Sensation absent  Pulses: diminshed  Skin: Normal and no erythema, no cyanosis or pallor   Other findings: none Exam performed with shoes and socks removed.

## 2024-01-14 ENCOUNTER — Emergency Department (HOSPITAL_COMMUNITY)

## 2024-01-14 ENCOUNTER — Encounter (HOSPITAL_COMMUNITY): Payer: Self-pay

## 2024-01-14 ENCOUNTER — Inpatient Hospital Stay (HOSPITAL_COMMUNITY)
Admission: EM | Admit: 2024-01-14 | Discharge: 2024-01-29 | DRG: 065 | Disposition: A | Discharge status: Skilled Nursing Facility | Attending: Family Medicine | Admitting: Family Medicine

## 2024-01-14 ENCOUNTER — Other Ambulatory Visit: Payer: Self-pay

## 2024-01-14 DIAGNOSIS — E063 Autoimmune thyroiditis: Secondary | ICD-10-CM | POA: Diagnosis present

## 2024-01-14 DIAGNOSIS — E669 Obesity, unspecified: Secondary | ICD-10-CM | POA: Diagnosis not present

## 2024-01-14 DIAGNOSIS — R29719 NIHSS score 19: Secondary | ICD-10-CM

## 2024-01-14 DIAGNOSIS — I5042 Chronic combined systolic (congestive) and diastolic (congestive) heart failure: Secondary | ICD-10-CM | POA: Diagnosis present

## 2024-01-14 DIAGNOSIS — R9431 Abnormal electrocardiogram [ECG] [EKG]: Secondary | ICD-10-CM | POA: Diagnosis present

## 2024-01-14 DIAGNOSIS — Z794 Long term (current) use of insulin: Secondary | ICD-10-CM

## 2024-01-14 DIAGNOSIS — R1310 Dysphagia, unspecified: Secondary | ICD-10-CM | POA: Diagnosis present

## 2024-01-14 DIAGNOSIS — I69351 Hemiplegia and hemiparesis following cerebral infarction affecting right dominant side: Secondary | ICD-10-CM | POA: Diagnosis not present

## 2024-01-14 DIAGNOSIS — R4701 Aphasia: Secondary | ICD-10-CM | POA: Diagnosis present

## 2024-01-14 DIAGNOSIS — I161 Hypertensive emergency: Secondary | ICD-10-CM | POA: Diagnosis not present

## 2024-01-14 DIAGNOSIS — R1312 Dysphagia, oropharyngeal phase: Secondary | ICD-10-CM | POA: Diagnosis not present

## 2024-01-14 DIAGNOSIS — I639 Cerebral infarction, unspecified: Principal | ICD-10-CM | POA: Diagnosis present

## 2024-01-14 DIAGNOSIS — Z23 Encounter for immunization: Secondary | ICD-10-CM

## 2024-01-14 DIAGNOSIS — I5021 Acute systolic (congestive) heart failure: Secondary | ICD-10-CM

## 2024-01-14 DIAGNOSIS — E1151 Type 2 diabetes mellitus with diabetic peripheral angiopathy without gangrene: Secondary | ICD-10-CM | POA: Diagnosis not present

## 2024-01-14 DIAGNOSIS — F32A Depression, unspecified: Secondary | ICD-10-CM | POA: Diagnosis present

## 2024-01-14 DIAGNOSIS — D696 Thrombocytopenia, unspecified: Secondary | ICD-10-CM | POA: Diagnosis not present

## 2024-01-14 DIAGNOSIS — Z8249 Family history of ischemic heart disease and other diseases of the circulatory system: Secondary | ICD-10-CM

## 2024-01-14 DIAGNOSIS — E66811 Obesity, class 1: Secondary | ICD-10-CM | POA: Diagnosis present

## 2024-01-14 DIAGNOSIS — E876 Hypokalemia: Secondary | ICD-10-CM | POA: Diagnosis present

## 2024-01-14 DIAGNOSIS — Z635 Disruption of family by separation and divorce: Secondary | ICD-10-CM

## 2024-01-14 DIAGNOSIS — Z91148 Patient's other noncompliance with medication regimen for other reason: Secondary | ICD-10-CM

## 2024-01-14 DIAGNOSIS — Z6830 Body mass index (BMI) 30.0-30.9, adult: Secondary | ICD-10-CM | POA: Diagnosis not present

## 2024-01-14 DIAGNOSIS — R21 Rash and other nonspecific skin eruption: Secondary | ICD-10-CM | POA: Diagnosis not present

## 2024-01-14 DIAGNOSIS — Z515 Encounter for palliative care: Secondary | ICD-10-CM | POA: Diagnosis not present

## 2024-01-14 DIAGNOSIS — I10 Essential (primary) hypertension: Secondary | ICD-10-CM | POA: Diagnosis present

## 2024-01-14 DIAGNOSIS — Z604 Social exclusion and rejection: Secondary | ICD-10-CM | POA: Diagnosis present

## 2024-01-14 DIAGNOSIS — E1165 Type 2 diabetes mellitus with hyperglycemia: Secondary | ICD-10-CM | POA: Diagnosis not present

## 2024-01-14 DIAGNOSIS — R29714 NIHSS score 14: Secondary | ICD-10-CM | POA: Diagnosis present

## 2024-01-14 DIAGNOSIS — Z7902 Long term (current) use of antithrombotics/antiplatelets: Secondary | ICD-10-CM

## 2024-01-14 DIAGNOSIS — I63533 Cerebral infarction due to unspecified occlusion or stenosis of bilateral posterior cerebral arteries: Secondary | ICD-10-CM | POA: Diagnosis present

## 2024-01-14 DIAGNOSIS — I6381 Other cerebral infarction due to occlusion or stenosis of small artery: Secondary | ICD-10-CM

## 2024-01-14 DIAGNOSIS — R29810 Facial weakness: Secondary | ICD-10-CM | POA: Diagnosis present

## 2024-01-14 DIAGNOSIS — Z789 Other specified health status: Secondary | ICD-10-CM | POA: Diagnosis not present

## 2024-01-14 DIAGNOSIS — R0682 Tachypnea, not elsewhere classified: Secondary | ICD-10-CM | POA: Diagnosis not present

## 2024-01-14 DIAGNOSIS — E039 Hypothyroidism, unspecified: Secondary | ICD-10-CM | POA: Diagnosis present

## 2024-01-14 DIAGNOSIS — Z79899 Other long term (current) drug therapy: Secondary | ICD-10-CM

## 2024-01-14 DIAGNOSIS — Z7984 Long term (current) use of oral hypoglycemic drugs: Secondary | ICD-10-CM

## 2024-01-14 DIAGNOSIS — Z7982 Long term (current) use of aspirin: Secondary | ICD-10-CM | POA: Diagnosis not present

## 2024-01-14 DIAGNOSIS — Z558 Other problems related to education and literacy: Secondary | ICD-10-CM | POA: Diagnosis not present

## 2024-01-14 DIAGNOSIS — Z66 Do not resuscitate: Secondary | ICD-10-CM | POA: Diagnosis not present

## 2024-01-14 DIAGNOSIS — E118 Type 2 diabetes mellitus with unspecified complications: Secondary | ICD-10-CM

## 2024-01-14 DIAGNOSIS — F419 Anxiety disorder, unspecified: Secondary | ICD-10-CM | POA: Diagnosis present

## 2024-01-14 DIAGNOSIS — F988 Other specified behavioral and emotional disorders with onset usually occurring in childhood and adolescence: Secondary | ICD-10-CM | POA: Diagnosis present

## 2024-01-14 DIAGNOSIS — E1369 Other specified diabetes mellitus with other specified complication: Secondary | ICD-10-CM | POA: Diagnosis not present

## 2024-01-14 DIAGNOSIS — I6389 Other cerebral infarction: Secondary | ICD-10-CM | POA: Diagnosis not present

## 2024-01-14 DIAGNOSIS — I5181 Takotsubo syndrome: Secondary | ICD-10-CM | POA: Diagnosis present

## 2024-01-14 DIAGNOSIS — Z7401 Bed confinement status: Secondary | ICD-10-CM

## 2024-01-14 DIAGNOSIS — E440 Moderate protein-calorie malnutrition: Secondary | ICD-10-CM | POA: Diagnosis present

## 2024-01-14 DIAGNOSIS — I110 Hypertensive heart disease with heart failure: Secondary | ICD-10-CM | POA: Diagnosis present

## 2024-01-14 DIAGNOSIS — I69391 Dysphagia following cerebral infarction: Secondary | ICD-10-CM | POA: Diagnosis not present

## 2024-01-14 DIAGNOSIS — R414 Neurologic neglect syndrome: Secondary | ICD-10-CM | POA: Diagnosis present

## 2024-01-14 DIAGNOSIS — E119 Type 2 diabetes mellitus without complications: Secondary | ICD-10-CM

## 2024-01-14 DIAGNOSIS — Z7189 Other specified counseling: Secondary | ICD-10-CM | POA: Diagnosis not present

## 2024-01-14 LAB — I-STAT CHEM 8, ED
BUN: 10 mg/dL (ref 6–20)
Calcium, Ion: 1.12 mmol/L — ABNORMAL LOW (ref 1.15–1.40)
Chloride: 99 mmol/L (ref 98–111)
Creatinine, Ser: 1 mg/dL (ref 0.44–1.00)
Glucose, Bld: 170 mg/dL — ABNORMAL HIGH (ref 70–99)
HCT: 56 % — ABNORMAL HIGH (ref 36.0–46.0)
Hemoglobin: 19 g/dL — ABNORMAL HIGH (ref 12.0–15.0)
Potassium: 3.2 mmol/L — ABNORMAL LOW (ref 3.5–5.1)
Sodium: 140 mmol/L (ref 135–145)
TCO2: 26 mmol/L (ref 22–32)

## 2024-01-14 LAB — COMPREHENSIVE METABOLIC PANEL WITH GFR
ALT: 25 U/L (ref 0–44)
AST: 22 U/L (ref 15–41)
Albumin: 4.3 g/dL (ref 3.5–5.0)
Alkaline Phosphatase: 121 U/L (ref 38–126)
Anion gap: 19 — ABNORMAL HIGH (ref 5–15)
BUN: 9 mg/dL (ref 6–20)
CO2: 22 mmol/L (ref 22–32)
Calcium: 10.1 mg/dL (ref 8.9–10.3)
Chloride: 99 mmol/L (ref 98–111)
Creatinine, Ser: 0.96 mg/dL (ref 0.44–1.00)
GFR, Estimated: >60 mL/min (ref >60)
Glucose, Bld: 166 mg/dL — ABNORMAL HIGH (ref 70–99)
Potassium: 3.4 mmol/L — ABNORMAL LOW (ref 3.5–5.1)
Sodium: 140 mmol/L (ref 135–145)
Total Bilirubin: 0.9 mg/dL (ref 0.0–1.2)
Total Protein: 8.1 g/dL (ref 6.5–8.1)

## 2024-01-14 LAB — DIFFERENTIAL
Abs Immature Granulocytes: 0.01 K/uL (ref 0.00–0.07)
Basophils Absolute: 0.1 K/uL (ref 0.0–0.1)
Basophils Relative: 1 %
Eosinophils Absolute: 0.2 K/uL (ref 0.0–0.5)
Eosinophils Relative: 3 %
Immature Granulocytes: 0 %
Lymphocytes Relative: 30 %
Lymphs Abs: 2.4 K/uL (ref 0.7–4.0)
Monocytes Absolute: 0.4 K/uL (ref 0.1–1.0)
Monocytes Relative: 5 %
Neutro Abs: 4.8 K/uL (ref 1.7–7.7)
Neutrophils Relative %: 61 %

## 2024-01-14 LAB — PROTIME-INR
INR: 0.9 (ref 0.8–1.2)
Prothrombin Time: 12.9 s (ref 11.4–15.2)

## 2024-01-14 LAB — CBC
HCT: 55.9 % — ABNORMAL HIGH (ref 36.0–46.0)
Hemoglobin: 19.4 g/dL — ABNORMAL HIGH (ref 12.0–15.0)
MCH: 31 pg (ref 26.0–34.0)
MCHC: 34.7 g/dL (ref 30.0–36.0)
MCV: 89.4 fL (ref 80.0–100.0)
Platelets: 216 K/uL (ref 150–400)
RBC: 6.25 MIL/uL — ABNORMAL HIGH (ref 3.87–5.11)
RDW: 13.4 % (ref 11.5–15.5)
WBC: 7.8 K/uL (ref 4.0–10.5)
nRBC: 0 % (ref 0.0–0.2)

## 2024-01-14 LAB — ETHANOL: Alcohol, Ethyl (B): <15 mg/dL (ref <15)

## 2024-01-14 LAB — APTT: aPTT: 28 s (ref 24–36)

## 2024-01-14 LAB — CBG MONITORING, ED: Glucose-Capillary: 147 mg/dL — ABNORMAL HIGH (ref 70–99)

## 2024-01-14 LAB — GLUCOSE, CAPILLARY: Glucose-Capillary: 168 mg/dL — ABNORMAL HIGH (ref 70–99)

## 2024-01-14 LAB — MRSA NEXT GEN BY PCR, NASAL: MRSA by PCR Next Gen: NOT DETECTED

## 2024-01-14 MED ORDER — LORAZEPAM 2 MG/ML IJ SOLN
INTRAMUSCULAR | Status: AC
  Administered 2024-01-14: 1 mg via INTRAVENOUS
  Filled 2024-01-14: qty 1

## 2024-01-14 MED ORDER — SODIUM CHLORIDE 0.9 % IV SOLN: INTRAVENOUS | Status: DC

## 2024-01-14 MED ORDER — ACETAMINOPHEN 160 MG/5ML PO SOLN: 650.0000 mg | ORAL | Status: DC | PRN

## 2024-01-14 MED ORDER — ACETAMINOPHEN 650 MG RE SUPP: 650.0000 mg | RECTAL | Status: DC | PRN

## 2024-01-14 MED ORDER — LORAZEPAM 2 MG/ML IJ SOLN: 1.0000 mg | Freq: Once | INTRAMUSCULAR | Status: AC

## 2024-01-14 MED ORDER — STROKE: EARLY STAGES OF RECOVERY BOOK: Freq: Once | Status: AC

## 2024-01-14 MED ORDER — LABETALOL HCL 5 MG/ML IV SOLN
10.0000 mg | INTRAVENOUS | Status: DC | PRN
  Administered 2024-01-14 – 2024-01-21 (×25): 10 mg via INTRAVENOUS
  Filled 2024-01-14 (×24): qty 4

## 2024-01-14 MED ORDER — INSULIN ASPART 100 UNIT/ML IJ SOLN
0.0000 [IU] | Freq: Four times a day (QID) | INTRAMUSCULAR | Status: DC
  Administered 2024-01-14: 2 [IU] via SUBCUTANEOUS
  Administered 2024-01-15: 1 [IU] via SUBCUTANEOUS
  Administered 2024-01-15 (×3): 2 [IU] via SUBCUTANEOUS
  Administered 2024-01-16: 1 [IU] via SUBCUTANEOUS
  Administered 2024-01-16: 2 [IU] via SUBCUTANEOUS
  Administered 2024-01-16 – 2024-01-17 (×2): 1 [IU] via SUBCUTANEOUS
  Administered 2024-01-17: 2 [IU] via SUBCUTANEOUS
  Administered 2024-01-17: 1 [IU] via SUBCUTANEOUS
  Administered 2024-01-17: 2 [IU] via SUBCUTANEOUS
  Administered 2024-01-18: 1 [IU] via SUBCUTANEOUS
  Administered 2024-01-18: 2 [IU] via SUBCUTANEOUS
  Administered 2024-01-18 – 2024-01-21 (×9): 1 [IU] via SUBCUTANEOUS
  Administered 2024-01-24 (×2): 2 [IU] via SUBCUTANEOUS
  Administered 2024-01-24: 1 [IU] via SUBCUTANEOUS
  Administered 2024-01-25: 2 [IU] via SUBCUTANEOUS
  Administered 2024-01-25: 5 [IU] via SUBCUTANEOUS
  Administered 2024-01-25: 7 [IU] via SUBCUTANEOUS
  Administered 2024-01-25: 3 [IU] via SUBCUTANEOUS
  Administered 2024-01-26: 7 [IU] via SUBCUTANEOUS
  Administered 2024-01-26: 5 [IU] via SUBCUTANEOUS
  Administered 2024-01-26 – 2024-01-27 (×3): 3 [IU] via SUBCUTANEOUS
  Administered 2024-01-27: 2 [IU] via SUBCUTANEOUS
  Administered 2024-01-27: 3 [IU] via SUBCUTANEOUS
  Administered 2024-01-27 – 2024-01-28 (×3): 5 [IU] via SUBCUTANEOUS
  Administered 2024-01-28: 2 [IU] via SUBCUTANEOUS
  Administered 2024-01-28: 5 [IU] via SUBCUTANEOUS
  Administered 2024-01-29: 3 [IU] via SUBCUTANEOUS
  Administered 2024-01-29: 5 [IU] via SUBCUTANEOUS
  Administered 2024-01-29: 3 [IU] via SUBCUTANEOUS

## 2024-01-14 MED ORDER — CHLORHEXIDINE GLUCONATE CLOTH 2 % EX PADS
6.0000 | MEDICATED_PAD | Freq: Every day | CUTANEOUS | Status: DC
  Administered 2024-01-15 – 2024-01-29 (×15): 6 via TOPICAL

## 2024-01-14 MED ORDER — ASPIRIN 300 MG RE SUPP
300.0000 mg | Freq: Once | RECTAL | Status: AC
  Administered 2024-01-14: 300 mg via RECTAL
  Filled 2024-01-14: qty 1

## 2024-01-14 MED ORDER — ACETAMINOPHEN 325 MG PO TABS: 650.0000 mg | ORAL_TABLET | ORAL | Status: DC | PRN

## 2024-01-14 MED ORDER — ENOXAPARIN SODIUM 40 MG/0.4ML IJ SOSY
40.0000 mg | PREFILLED_SYRINGE | INTRAMUSCULAR | Status: DC
  Administered 2024-01-14 – 2024-01-19 (×6): 40 mg via SUBCUTANEOUS
  Filled 2024-01-14 (×5): qty 0.4

## 2024-01-14 MED ORDER — IOHEXOL 350 MG/ML SOLN
75.0000 mL | Freq: Once | INTRAVENOUS | Status: AC | PRN
  Administered 2024-01-14: 75 mL via INTRAVENOUS

## 2024-01-14 MED ORDER — SENNOSIDES-DOCUSATE SODIUM 8.6-50 MG PO TABS: 1.0000 | ORAL_TABLET | Freq: Every evening | ORAL | Status: DC | PRN

## 2024-01-14 NOTE — ED Notes (Signed)
 Per EDP, complete neuro checks q2hrs for 12hrs, then q4hrs

## 2024-01-14 NOTE — ED Provider Notes (Incomplete)
 [place] EMERGENCY DEPARTMENT AT [facility] Provider Note   CSN: [id number] Arrival date & time: 01/14/24  1327     Patient presents with: Code Stroke   Connie Shepard is a 58 y.o. female.  {Add pertinent medical, surgical, social history, OB history to YEP:67052} Patient has a history of hypertension diabetes and multiple strokes.  She awoke today with gazing to the right and not speaking   Weakness      Prior to Admission medications   Medication Sig Start Date End Date Taking? Authorizing Provider  amLODipine (NORVASC) 10 MG tablet Take 10 mg by mouth daily. Patient not taking: Reported on 01/14/2024    [provider]  aspirin  EC 81 MG EC tablet Take 1 tablet (81 mg total) by mouth daily with breakfast. Swallow whole. Patient not taking: Reported on 01/14/2024 08/29/20   Willette Adriana LABOR, MD  atorvastatin  (LIPITOR) 40 MG tablet Take 40 mg by mouth daily. Patient not taking: Reported on 01/14/2024    [provider]  buPROPion  (WELLBUTRIN  SR) 150 MG 12 hr tablet Take 150 mg by mouth daily. Patient not taking: Reported on 01/14/2024    [provider]  buPROPion  (WELLBUTRIN ) 75 MG tablet Take 75 mg by mouth every morning. Patient not taking: Reported on 01/14/2024    [provider]  carvedilol  (COREG ) 12.5 MG tablet Take 1 tablet (12.5 mg total) by mouth 2 (two) times daily with a meal. Patient not taking: Reported on 01/14/2024 03/14/20   Ricky Fines, MD  clopidogrel  (PLAVIX ) 75 MG tablet Take 75 mg by mouth daily. Patient not taking: Reported on 01/14/2024    [provider]  escitalopram  (LEXAPRO ) 20 MG tablet Take 1 tablet (20 mg total) by mouth daily. Patient not taking: Reported on 01/14/2024 12/17/18   Joshua Clayborne RAMAN, PA-C  insulin  aspart (NOVOLOG ) 100 UNIT/ML injection Inject 0-20 Units into the skin 3 (three) times daily with meals. Patient not taking: Reported on 01/14/2024 08/28/20   Shahmehdi, Seyed A, MD   insulin  glargine (LANTUS ) 100 UNIT/ML injection Inject 0.06 mLs (6 Units total) into the skin at bedtime. Patient not taking: Reported on 01/14/2024 08/28/20   Willette Adriana LABOR, MD  levothyroxine  (SYNTHROID ) 100 MCG tablet Take 1 tablet (100 mcg total) by mouth daily at 6 (six) AM. Patient not taking: Reported on 01/14/2024 08/29/20 09/28/20  Willette Adriana LABOR, MD  losartan  (COZAAR ) 50 MG tablet Take 50 mg by mouth daily. Patient not taking: Reported on 01/14/2024 01/13/24   [provider]  metFORMIN  (GLUCOPHAGE ) 500 MG tablet Take 1 tablet (500 mg total) by mouth daily with breakfast. Patient not taking: Reported on 01/14/2024 12/17/18   Joshua Clayborne RAMAN, PA-C  ondansetron  (ZOFRAN ) 4 MG tablet Take 4 mg by mouth every 8 (eight) hours as needed. Patient not taking: Reported on 01/14/2024    [provider]  potassium chloride  SA (KLOR-CON ) 20 MEQ tablet Take 1 tablet (20 mEq total) by mouth 2 (two) times daily. Patient not taking: Reported on 01/14/2024 06/28/20   Raford Lenis, MD  spironolactone  (ALDACTONE ) 25 MG tablet Take 1 tablet (25 mg total) by mouth daily. Patient not taking: Reported on 01/14/2024 12/17/18   Joshua Clayborne RAMAN, PA-C  torsemide  (DEMADEX ) 20 MG tablet Take 2 tablets (40 mg total) by mouth 2 (two) times daily. Patient not taking: Reported on 01/14/2024 03/14/20   Ricky Fines, MD    Allergies: Hydroxyzine     Review of Systems  Neurological:  Positive for weakness.  Updated Vital Signs BP (!) 216/141   Pulse (!) 110   Resp 16   SpO2 95%   Physical Exam  (all labs ordered are listed, but only abnormal results are displayed) Labs Reviewed  CBC - Abnormal; Notable for the following components:      Result Value   RBC 6.25 (*)    Hemoglobin 19.4 (*)    HCT 55.9 (*)    All other components within normal limits  COMPREHENSIVE METABOLIC PANEL WITH GFR - Abnormal; Notable for the following components:   Potassium 3.4 (*)    Glucose, Bld 166 (*)    Anion  gap 19 (*)    All other components within normal limits  I-STAT CHEM 8, ED - Abnormal; Notable for the following components:   Potassium 3.2 (*)    Glucose, Bld 170 (*)    Calcium , Ion 1.12 (*)    Hemoglobin 19.0 (*)    HCT 56.0 (*)    All other components within normal limits  CBG MONITORING, ED - Abnormal; Notable for the following components:   Glucose-Capillary 147 (*)    All other components within normal limits  ETHANOL  PROTIME-INR  APTT  DIFFERENTIAL  URINE DRUG SCREEN    EKG: None  Radiology: MR BRAIN WO CONTRAST Result Date: 01/14/2024 EXAM: MRI BRAIN WITHOUT CONTRAST 01/14/2024 02:27:37 PM TECHNIQUE: Multiplanar multisequence MRI of the head/brain was performed without the administration of intravenous contrast. COMPARISON: Same day CT head and CTA head and neck. CLINICAL HISTORY: Neuro deficit, acute, stroke suspected. Neuro deficit, acute. FINDINGS: BRAIN AND VENTRICLES: Linear focus of acute infarct extending from the right corona radiata and body of the caudate into the posterior aspect of the lentiform nucleus. Additional small focus of acute infarct within the right external capsule. Extensive chronic microvascular ischemic changes. Generalized parenchymal volume loss. Remote infarcts in the pons. Multiple remote lacunar infarcts in the basal ganglia and thalami. Small remote infarcts in the bilateral cerebellum. Additional small remote lacunar infarcts in the right external capsule. Encephalomalacia in the left occipital lobe suggestive of remote infarct. 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 There are multiple scattered areas of susceptibility in the bilateral cerebral hemispheres with additional areas in the bilateral basal ganglia and thalami and in the left cerebellum. The distribution of chronic microhemorrhages suggests cerebral amyloid angiopathy versus possible component of hypertensive encephalopathy.  No intracranial hemorrhage. No mass. No midline shift. No hydrocephalus. The sella is unremarkable. Normal flow voids. ORBITS: No acute abnormality. SINUSES AND MASTOIDS: No acute abnormality. BONES AND SOFT TISSUES: Normal marrow signal. No acute soft tissue abnormality. IMPRESSION: 1. Acute infarcts in the right corona radiata, caudate, lentiform nucleus, and external capsule. 2. Extensive chronic microvascular ischemic changes, generalized parenchymal volume loss, and multiple remote infarcts. 3. 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 Electronically signed by: Donnice Mania MD 01/14/2024 02:43 PM EDT RP Workstation: [id number]   CT ANGIO HEAD NECK W WO CM (CODE STROKE) Result Date: 01/14/2024 EXAM: CTA Head and Neck with Intravenous Contrast. CT Head without Contrast. CLINICAL HISTORY: Stroke, follow up. Pt BIB ems for CODE STROKE. Per EMS pt was LKN 1230 by caregiver. Pt has hx of previous strokes with rt arm deficits. Pt has rt gaze, rt side facial droop, aphasic. Per EMS pt is normally talking and A \\T \ O times 4. TECHNIQUE: Axial CTA images of the head and neck performed with intravenous  contrast. MIP reconstructed images were created and reviewed. Axial computed tomography images of the head/brain performed without intravenous contrast. Note: Per PQRS, the description of internal carotid artery percent stenosis, including 0 percent or normal exam, is based on [place] American Symptomatic Carotid Endarterectomy Trial (NASCET) criteria. Dose reduction technique was used including one or more of the following: automated exposure control, adjustment of mA and kV according to patient size, and/or iterative reconstruction. CONTRAST: Without and with; 75 mL iohexol  (OMNIPAQUE ) 350 MG/ML injection. COMPARISON: CT head dated 01/14/2024 and MRI dated 08/24/2020. FINDINGS: CT HEAD: BRAIN: No acute  intraparenchymal hemorrhage. No mass lesion. No CT evidence for acute territorial infarct. No midline shift or extra-axial collection. VENTRICLES: No hydrocephalus. ORBITS: The orbits are unremarkable. SINUSES AND MASTOIDS: The paranasal sinuses and mastoid air cells are clear. CTA NECK: COMMON CAROTID ARTERIES: There is mild atherosclerosis at the right carotid bifurcation without hemodynamically significant stenosis. No dissection or occlusion. INTERNAL CAROTID ARTERIES: No stenosis by NASCET criteria. No dissection or occlusion. VERTEBRAL ARTERIES: Atherosclerosis involving the V4 segments of both vertebral arteries. There is mild stenosis of the proximal right V4 segment and mild to moderate stenosis of the proximal left V4 segment. No dissection or occlusion. CTA HEAD: ANTERIOR CEREBRAL ARTERIES: The anterior cerebral arteries are patent bilaterally. No significant stenosis. No occlusion. No aneurysm. MIDDLE CEREBRAL ARTERIES: The middle cerebral arteries are patent bilaterally. There is mild atherosclerotic irregularity of multiple M2 branches of the right MCA. Severe stenosis of a proximal M2 branch of the left MCA. Additional moderate stenosis of a distal M2/proximal M3 branch of the left MCA within the posterior aspect of the left MCA territory. No aneurysm. POSTERIOR CEREBRAL ARTERIES: There is severe stenosis of the P1 segment right PCA with occlusion of the right P2 segment which appears similar to the prior MRI. There are minimal distal branches of the right PCA visualized. Similar severe stenosis of the proximal P2 segment of the left PCA. There is tapering and likely occlusion of the distal P2 segment of the left PCA similar to the prior MRI. Minimal branches of the left PCA visualized. No aneurysm. BASILAR ARTERY: No significant stenosis. No occlusion. No aneurysm. OTHER: The intracranial internal carotid arteries are patent bilaterally. There is atherosclerosis of the carotid siphons. Mild stenosis  of the bilateral supraclinoid ICA, slightly greater on the right. SOFT TISSUES: No acute finding. No masses or lymphadenopathy. BONES: Lucency surrounding an unerupted right mandibular molar. No acute osseous abnormality. IMPRESSION: 1. No evidence of acute large vessel occlusion. 2. 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 Severe stenosis of the proximal P2 segment of the left PCA with tapering and likely occlusion of the distal P2 segment, similar to prior MRA. 3. Severe stenosis of a proximal M2 branch and additional moderate stenosis of a distal M2/proximal M3 branch of the left MCA. 4. Mild atherosclerotic irregularity of multiple M2 branches of the right MCA. Electronically signed by: Donnice Mania MD 01/14/2024 02:17 PM EDT RP Workstation: [id number]   CT HEAD CODE STROKE WO CONTRAST Result Date: 01/14/2024 CLINICAL DATA:  Code stroke. Neuro deficit, acute, stroke suspected. EXAM: CT HEAD WITHOUT CONTRAST TECHNIQUE: Contiguous axial images were obtained from the base of the skull through the vertex without intravenous contrast. RADIATION DOSE REDUCTION: This exam was performed according to the departmental dose-optimization program which includes automated exposure control, adjustment of the mA and/or kV according to patient size and/or use of iterative  reconstruction technique. COMPARISON:  Brain MRI 08/24/2020.  Head CT 08/24/2020. FINDINGS: Brain: Generalized cerebral and cerebellar atrophy. 17 mm infarct within the thalamus and posterior limb of internal capsule on the left, new from the prior head CT of 08/24/2020. Known chronic lacunar infarcts elsewhere within the genu and posterior limb of left internal capsule, and within the right thalamus. Redemonstrated chronic cortical/subcortical infarct within the right occipital lobe. Chronic cortical/subcortical infarct within the left occipital lobe, new from the prior head CT. Background  moderate patchy and ill-defined hypoattenuation within the cerebral white matter, nonspecific but compatible chronic small vessel ischemic disease. There is no acute intracranial hemorrhage. No extra-axial fluid collection. No evidence of an intracranial mass. No midline shift. Vascular: No hyperdense vessel.  Atherosclerotic calcifications. Skull: No calvarial fracture or aggressive osseous lesion. Sinuses/Orbits: No mass or acute finding within the imaged orbits. No significant paranasal sinus disease at the imaged levels. ASPECTS [facility] Stroke Program Early CT Score) - Ganglionic level infarction (caudate, lentiform nuclei, internal capsule, insula, M1-M3 cortex): 6 - Supraganglionic infarction (M4-M6 cortex): 3 Total score (0-10 with 10 being normal): 9 (when discounting chronic infarcts). Impression #1 communicated to Dr. Voncile at 2:00 pmon 9/30/2025by text page via the [facility] messaging system. IMPRESSION: 1. 17 mm infarct within the thalamus and posterior limb of internal capsule on the left, new from prior exams and potentially acute. Consider a brain MRI for further evaluation. 2. Background parenchymal atrophy, chronic small vessel ischemic disease and chronic infarcts, as described. Electronically Signed   By: Rockey Childs D.O.   On: 01/14/2024 14:01    {Document cardiac monitor, telemetry assessment procedure when appropriate:32947} Procedures   Medications Ordered in the ED  iohexol  (OMNIPAQUE ) 350 MG/ML injection 75 mL (75 mLs Intravenous Contrast Given 01/14/24 1354)  LORazepam  (ATIVAN ) injection 1 mg (1 mg Intravenous Given 01/14/24 1400)   CRITICAL CARE Performed by: Fairy Sermon Total critical care time: 45 minutes Critical care time was exclusive of separately billable procedures and treating other patients. Critical care was necessary to treat or prevent imminent or life-threatening deterioration. Critical care was time spent personally by me on the following activities: development  of treatment plan with patient and/or surrogate as well as nursing, discussions with consultants, evaluation of patient's response to treatment, examination of patient, obtaining history from patient or surrogate, ordering and performing treatments and interventions, ordering and review of laboratory studies, ordering and review of radiographic studies, pulse oximetry and re-evaluation of patient's condition.    {Click here for ABCD2, HEART and other calculators REFRESH Note before signing:1}                               Patient with acute stroke.  She will be admitted to medicine with neuro consult.  {Document critical care time when appropriate  Document review of labs and clinical decision tools ie [id number], etc  Document your independent review of radiology images and any outside records  Document your discussion with family members, caretakers and with consultants  Document social determinants of health affecting pt's care  Document your decision making why or why not admission, treatments were needed:32947:::1}   Final diagnoses:  Acute ischemic stroke [facility])    ED Discharge Orders     None

## 2024-01-14 NOTE — ED Notes (Signed)
 Per Dr. Deedra,

## 2024-01-14 NOTE — ED Triage Notes (Signed)
 Pt BIB ems for CODE STROKE. Per EMS pt was LKN 1230 by caregiver. Pt has hx of previous strokes with rt arm deficits. Pt has rt gaze, rt side facial droop, aphasic. Per EMS pt is normally talking and A & O times 4.

## 2024-01-14 NOTE — H&P (Signed)
 History and Physical    Connie Shepard FMW:[id number] DOB: 07-07-1965 DOA: 01/14/2024  PCP: Bobbette Coye LABOR, MD   Patient coming from: Home  I have personally briefly reviewed patient's old medical records in [facility]  Chief Complaint: Speech change  HPI: Connie Shepard is a 58 y.o. female with medical history significant for stroke with baseline right hemiparesis, diabetes mellitus, CHF, hypertension, hypothyroidism. Patient was brought to the ED via EMS with reports of unresponsiveness, right gaze, and aphasia.  Family reported that patient was last known well at about 8 PM last night when she went to bed.  This morning when she woke up she is not feeling well and was not talking much at all, it was about 12:30 PM after she woke up from a nap and I did notice that she was not talking at all.  EMS noted that she was gazing to the right on the following directions. On my evaluation, patient is awake, but gazing to the right and unable to answer questions.  ED Course: Persistently elevated blood pressure up to 219/155.  Heart rate 102-112.  Respiratory rate-11-22.  Code stroke called.  Patient outside window for thrombolysis.   Head CT-suggested an acute infarct. Subsequent MRI brainAcute infarcts in the right corona radiata, caudate, lentiform nucleus, and external capsule. Extensive chronic microvascular ischemic changes, generalized parenchymal volume loss, and multiple remote infarcts. CTA head and neck-severe stenosis in several arteries. Tele-neurology was consulted, okay for patient to be admitted here at [facility].   Review of Systems: As per HPI all other systems reviewed and negative.  Past Medical History:  Diagnosis Date   Acute heart failure (HCC)    ADD (attention deficit disorder)    Bronchitis    Depression    Diabetes mellitus without complication (HCC)    Elevated troponin    Hypertension    Obesity     Past Surgical History:  Procedure Laterality Date    CESAREAN SECTION       reports that she has never smoked. She has never used smokeless tobacco. She reports that she does not drink alcohol and does not use drugs.  Allergies  Allergen Reactions   Hydroxyzine  Other (See Comments)    Bad dreams    Family History  Problem Relation Age of Onset   Kidney Stones Mother    Hypertension Mother    Heart disease Father    Prior to Admission medications   Medication Sig Start Date End Date Taking? Authorizing Provider  amLODipine (NORVASC) 10 MG tablet Take 10 mg by mouth daily. Patient not taking: Reported on 01/14/2024    [provider]  aspirin  EC 81 MG EC tablet Take 1 tablet (81 mg total) by mouth daily with breakfast. Swallow whole. Patient not taking: Reported on 01/14/2024 08/29/20   Willette Adriana LABOR, MD  atorvastatin  (LIPITOR) 40 MG tablet Take 40 mg by mouth daily. Patient not taking: Reported on 01/14/2024    [provider]  buPROPion  (WELLBUTRIN  SR) 150 MG 12 hr tablet Take 150 mg by mouth daily. Patient not taking: Reported on 01/14/2024    [provider]  buPROPion  (WELLBUTRIN ) 75 MG tablet Take 75 mg by mouth every morning. Patient not taking: Reported on 01/14/2024    [provider]  carvedilol  (COREG ) 12.5 MG tablet Take 1 tablet (12.5 mg total) by mouth 2 (two) times daily with a meal. Patient not taking: Reported on 01/14/2024 03/14/20   Ricky Fines, MD  clopidogrel  (PLAVIX ) 75  MG tablet Take 75 mg by mouth daily. Patient not taking: Reported on 01/14/2024    [provider]  escitalopram  (LEXAPRO ) 20 MG tablet Take 1 tablet (20 mg total) by mouth daily. Patient not taking: Reported on 01/14/2024 12/17/18   Joshua Clayborne RAMAN, PA-C  insulin  aspart (NOVOLOG ) 100 UNIT/ML injection Inject 0-20 Units into the skin 3 (three) times daily with meals. Patient not taking: Reported on 01/14/2024 08/28/20   Shahmehdi, Seyed A, MD  insulin  glargine (LANTUS ) 100 UNIT/ML injection Inject 0.06  mLs (6 Units total) into the skin at bedtime. Patient not taking: Reported on 01/14/2024 08/28/20   Willette Adriana LABOR, MD  levothyroxine  (SYNTHROID ) 100 MCG tablet Take 1 tablet (100 mcg total) by mouth daily at 6 (six) AM. Patient not taking: Reported on 01/14/2024 08/29/20 09/28/20  Willette Adriana LABOR, MD  losartan  (COZAAR ) 50 MG tablet Take 50 mg by mouth daily. Patient not taking: Reported on 01/14/2024 01/13/24   [provider]  metFORMIN  (GLUCOPHAGE ) 500 MG tablet Take 1 tablet (500 mg total) by mouth daily with breakfast. Patient not taking: Reported on 01/14/2024 12/17/18   Joshua Clayborne RAMAN, PA-C  ondansetron  (ZOFRAN ) 4 MG tablet Take 4 mg by mouth every 8 (eight) hours as needed. Patient not taking: Reported on 01/14/2024    [provider]  potassium chloride  SA (KLOR-CON ) 20 MEQ tablet Take 1 tablet (20 mEq total) by mouth 2 (two) times daily. Patient not taking: Reported on 01/14/2024 06/28/20   Raford Lenis, MD  spironolactone  (ALDACTONE ) 25 MG tablet Take 1 tablet (25 mg total) by mouth daily. Patient not taking: Reported on 01/14/2024 12/17/18   Joshua Clayborne RAMAN, PA-C  torsemide  (DEMADEX ) 20 MG tablet Take 2 tablets (40 mg total) by mouth 2 (two) times daily. Patient not taking: Reported on 01/14/2024 03/14/20   Ricky Fines, MD    Physical Exam: Vitals:   01/14/24 1545 01/14/24 1600 01/14/24 1635 01/14/24 1820  BP: (!) 217/148 (!) 219/155    Pulse: (!) 111 (!) 111    Resp: 18 (!) 22    Temp:   (!) 96.9 F (36.1 C) 98.2 F (36.8 C)  TempSrc:   Axillary Axillary  SpO2: 95% 97%    Weight:   83.1 kg   Height:   5' 5 (1.651 m)     Constitutional: Acutely ill-appearing, gazing to the right, calm Vitals:   01/14/24 1545 01/14/24 1600 01/14/24 1635 01/14/24 1820  BP: (!) 217/148 (!) 219/155    Pulse: (!) 111 (!) 111    Resp: 18 (!) 22    Temp:   (!) 96.9 F (36.1 C) 98.2 F (36.8 C)  TempSrc:   Axillary Axillary  SpO2: 95% 97%    Weight:   83.1 kg   Height:   5'  5 (1.651 m)    Eyes: PERRL, lids and conjunctivae normal ENMT: Mucous membranes are dry.   Neck: normal, supple, no masses, no thyromegaly Respiratory: clear to auscultation bilaterally, no wheezing, no crackles. Normal respiratory effort. No accessory muscle use.  Cardiovascular: Regular rate and rhythm, no murmurs / rubs / gallops. No extremity edema.  Extremities warm. Abdomen: no tenderness, no masses palpated. No hepatosplenomegaly. Bowel sounds positive.  Musculoskeletal: no clubbing / cyanosis. No joint deformity upper and lower extremities. Good ROM, no contractures. Normal muscle tone.  Skin: no rashes, lesions, ulcers. No induration Neurologic:  Neurological: Exam limited by patient's significant neurologic deficits    Mental Status: She is awake.,  Aphasic.  Comments: Mental Status:  Not following directions, gazing to the right, not tracking.  Not answering questions, would occasionally yawn.  Moving her left upper and lower extremity to a limited extent. Not moving her right upper and lower extremities. CV: distal pulses palpable throughout  .  Psychiatric: Awake.  Aphasic.  Labs on Admission: I have personally reviewed following labs and imaging studies  CBC: Recent Labs  Lab 01/14/24 1331 01/14/24 1334  WBC 7.8  --   NEUTROABS 4.8  --   HGB 19.4* 19.0*  HCT 55.9* 56.0*  MCV 89.4  --   PLT 216  --    Basic Metabolic Panel: Recent Labs  Lab 01/14/24 1331 01/14/24 1334  NA 140 140  K 3.4* 3.2*  CL 99 99  CO2 22  --   GLUCOSE 166* 170*  BUN 9 10  CREATININE 0.96 1.00  CALCIUM  10.1  --    GFR: Estimated Creatinine Clearance: 66.8 mL/min (by C-G formula based on SCr of 1 mg/dL). Liver Function Tests: Recent Labs  Lab 01/14/24 1331  AST 22  ALT 25  ALKPHOS 121  BILITOT 0.9  PROT 8.1  ALBUMIN 4.3   Coagulation Profile: Recent Labs  Lab 01/14/24 1331  INR 0.9   CBG: Recent Labs  Lab 01/14/24 1329  GLUCAP 147*   Radiological Exams on  Admission: MR BRAIN WO CONTRAST Result Date: 01/14/2024 EXAM: MRI BRAIN WITHOUT CONTRAST 01/14/2024 02:27:37 PM TECHNIQUE: Multiplanar multisequence MRI of the head/brain was performed without the administration of intravenous contrast. COMPARISON: Same day CT head and CTA head and neck. CLINICAL HISTORY: Neuro deficit, acute, stroke suspected. Neuro deficit, acute. FINDINGS: BRAIN AND VENTRICLES: Linear focus of acute infarct extending from the right corona radiata and body of the caudate into the posterior aspect of the lentiform nucleus. Additional small focus of acute infarct within the right external capsule. Extensive chronic microvascular ischemic changes. Generalized parenchymal volume loss. Remote infarcts in the pons. Multiple remote lacunar infarcts in the basal ganglia and thalami. Small remote infarcts in the bilateral cerebellum. Additional small remote lacunar infarcts in the right external capsule. Encephalomalacia in the left occipital lobe suggestive of remote infarct. 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 There are multiple scattered areas of susceptibility in the bilateral cerebral hemispheres with additional areas in the bilateral basal ganglia and thalami and in the left cerebellum. The distribution of chronic microhemorrhages suggests cerebral amyloid angiopathy versus possible component of hypertensive encephalopathy. No intracranial hemorrhage. No mass. No midline shift. No hydrocephalus. The sella is unremarkable. Normal flow voids. ORBITS: No acute abnormality. SINUSES AND MASTOIDS: No acute abnormality. BONES AND SOFT TISSUES: Normal marrow signal. No acute soft tissue abnormality. IMPRESSION: 1. Acute infarcts in the right corona radiata, caudate, lentiform nucleus, and external capsule. 2. Extensive chronic microvascular ischemic changes, generalized parenchymal volume loss, and multiple remote infarcts. 3. Multiple  scattered areas of susceptibility as above suggestive of cerebral amyloid angiopathy versus possible component of hypertensive encephalopathy. Electronically signed by: Donnice Mania MD 01/14/2024 02:43 PM EDT RP Workstation: [id number]   CT ANGIO HEAD NECK W WO CM (CODE STROKE) Result Date: 01/14/2024 EXAM: CTA Head and Neck with Intravenous Contrast. CT Head without Contrast. CLINICAL HISTORY: Stroke, follow up. Pt BIB ems for CODE STROKE. Per EMS pt was LKN 1230 by caregiver. Pt has hx of previous strokes with rt arm deficits. Pt has rt gaze, rt side facial droop, aphasic. Per EMS pt is normally talking and A \\T \  O times 4. TECHNIQUE: Axial CTA images of the head and neck performed with intravenous contrast. MIP reconstructed images were created and reviewed. Axial computed tomography images of the head/brain performed without intravenous contrast. Note: Per PQRS, the description of internal carotid artery percent stenosis, including 0 percent or normal exam, is based on [place] American Symptomatic Carotid Endarterectomy Trial (NASCET) criteria. Dose reduction technique was used including one or more of the following: automated exposure control, adjustment of mA and kV according to patient size, and/or iterative reconstruction. CONTRAST: Without and with; 75 mL iohexol  (OMNIPAQUE ) 350 MG/ML injection. COMPARISON: CT head dated 01/14/2024 and MRI dated 08/24/2020. FINDINGS: CT HEAD: BRAIN: No acute intraparenchymal hemorrhage. No mass lesion. No CT evidence for acute territorial infarct. No midline shift or extra-axial collection. VENTRICLES: No hydrocephalus. ORBITS: The orbits are unremarkable. SINUSES AND MASTOIDS: The paranasal sinuses and mastoid air cells are clear. CTA NECK: COMMON CAROTID ARTERIES: There is mild atherosclerosis at the right carotid bifurcation without hemodynamically significant stenosis. No dissection or occlusion. INTERNAL CAROTID ARTERIES: No stenosis by NASCET criteria. No dissection  or occlusion. VERTEBRAL ARTERIES: Atherosclerosis involving the V4 segments of both vertebral arteries. There is mild stenosis of the proximal right V4 segment and mild to moderate stenosis of the proximal left V4 segment. No dissection or occlusion. CTA HEAD: ANTERIOR CEREBRAL ARTERIES: The anterior cerebral arteries are patent bilaterally. No significant stenosis. No occlusion. No aneurysm. MIDDLE CEREBRAL ARTERIES: The middle cerebral arteries are patent bilaterally. There is mild atherosclerotic irregularity of multiple M2 branches of the right MCA. Severe stenosis of a proximal M2 branch of the left MCA. Additional moderate stenosis of a distal M2/proximal M3 branch of the left MCA within the posterior aspect of the left MCA territory. No aneurysm. POSTERIOR CEREBRAL ARTERIES: There is severe stenosis of the P1 segment right PCA with occlusion of the right P2 segment which appears similar to the prior MRI. There are minimal distal branches of the right PCA visualized. Similar severe stenosis of the proximal P2 segment of the left PCA. There is tapering and likely occlusion of the distal P2 segment of the left PCA similar to the prior MRI. Minimal branches of the left PCA visualized. No aneurysm. BASILAR ARTERY: No significant stenosis. No occlusion. No aneurysm. OTHER: The intracranial internal carotid arteries are patent bilaterally. There is atherosclerosis of the carotid siphons. Mild stenosis of the bilateral supraclinoid ICA, slightly greater on the right. SOFT TISSUES: No acute finding. No masses or lymphadenopathy. BONES: Lucency surrounding an unerupted right mandibular molar. No acute osseous abnormality. IMPRESSION: 1. No evidence of acute large vessel occlusion. 2. 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 Severe stenosis of the proximal P2 segment of the left PCA with tapering and likely occlusion of the distal P2 segment, similar to  prior MRA. 3. Severe stenosis of a proximal M2 branch and additional moderate stenosis of a distal M2/proximal M3 branch of the left MCA. 4. Mild atherosclerotic irregularity of multiple M2 branches of the right MCA. Electronically signed by: Donnice Mania MD 01/14/2024 02:17 PM EDT RP Workstation: [id number]   CT HEAD CODE STROKE WO CONTRAST Result Date: 01/14/2024 CLINICAL DATA:  Code stroke. Neuro deficit, acute, stroke suspected. EXAM: CT HEAD WITHOUT CONTRAST TECHNIQUE: Contiguous axial images were obtained from the base of the skull through the vertex without intravenous contrast. RADIATION DOSE REDUCTION: This exam was performed according to the departmental dose-optimization program which includes automated exposure  control, adjustment of the mA and/or kV according to patient size and/or use of iterative reconstruction technique. COMPARISON:  Brain MRI 08/24/2020.  Head CT 08/24/2020. FINDINGS: Brain: Generalized cerebral and cerebellar atrophy. 17 mm infarct within the thalamus and posterior limb of internal capsule on the left, new from the prior head CT of 08/24/2020. Known chronic lacunar infarcts elsewhere within the genu and posterior limb of left internal capsule, and within the right thalamus. Redemonstrated chronic cortical/subcortical infarct within the right occipital lobe. Chronic cortical/subcortical infarct within the left occipital lobe, new from the prior head CT. Background moderate patchy and ill-defined hypoattenuation within the cerebral white matter, nonspecific but compatible chronic small vessel ischemic disease. There is no acute intracranial hemorrhage. No extra-axial fluid collection. No evidence of an intracranial mass. No midline shift. Vascular: No hyperdense vessel.  Atherosclerotic calcifications. Skull: No calvarial fracture or aggressive osseous lesion. Sinuses/Orbits: No mass or acute finding within the imaged orbits. No significant paranasal sinus disease at the imaged  levels. ASPECTS [facility] Stroke Program Early CT Score) - Ganglionic level infarction (caudate, lentiform nuclei, internal capsule, insula, M1-M3 cortex): 6 - Supraganglionic infarction (M4-M6 cortex): 3 Total score (0-10 with 10 being normal): 9 (when discounting chronic infarcts). Impression #1 communicated to Dr. Voncile at 2:00 pmon 9/30/2025by text page via the [facility] messaging system. IMPRESSION: 1. 17 mm infarct within the thalamus and posterior limb of internal capsule on the left, new from prior exams and potentially acute. Consider a brain MRI for further evaluation. 2. Background parenchymal atrophy, chronic small vessel ischemic disease and chronic infarcts, as described. Electronically Signed   By: Rockey Childs D.O.   On: 01/14/2024 14:01   EKG: Independently reviewed.  Artifacts present.  Sinus tachycardia rate 108, QTc 506.  No significant change from prior.  Assessment/Plan Principal Problem:   Acute CVA (cerebrovascular accident) [facility]) Active Problems:   Essential hypertension   Diabetes mellitus (HCC)   Hypothyroidism   Chronic combined systolic and diastolic congestive heart failure (HCC)  Assessment and Plan:  Acute CVA-presenting with aphasia, left-sided hemineglect.  Last known well 8 PM last night. Baseline right hemiparesis from prior stroke.  MRI brain-  Acute infarcts in the right corona radiata, caudate, lentiform nucleus, and external capsule. Extensive chronic microvascular ischemic changes, generalized parenchymal volume loss, and multiple remote infarcts.  CTA head and neck reviewed stenosis in multiple vessels. - Code stroke called, patient outside window for thrombolysis - Evaluated by teleneurology-seizure versus stroke remain in differential.  Aspirin  and Plavix , allow for permissive hypertension and treat for systolic greater than 220 for the next 24 to 48 hours then start normalizing blood pressure, stroke workup, no benefit from transferring to [facility]. -Echocardiogram - PT OT speech therapy eval - Patient will remain n.p.o. - She is on aspirin , Plavix , Lipitor as outpatient - Rectal aspirin  300 mg x 1 - N/s 50 cc/hr x 1 day  Hypertension-blood pressure elevated up to 224/148 - As needed labetalol  for systolic greater than 220, allowing for permissive hypertension in the setting of acute stroke - Hold carvedilol  12.5 mg twice daily, Norvasc 10 mg, losartan  50 mg, spironolactone  and torsemide   Diabetes mellitus-A1c 12/02/2023-8.4.  - SSI- S q6h - Hold home Lantus  while NPO, metformin   Chronic combined systolic and diastolic CHF-stable and compensated.  Last echo 2022 EF 20% with global hypokinesis -Follow-up echo - Gentle hydration - Hold torsemide  40 mg twice daily, spironolactone    DVT prophylaxis: Lovenox  Code Status: Full code Family Communication: None at  bedside, previously present. Disposition Plan: > 2 days Consults called: Neurology Admission status: inpt stepdown I certify that at the point of admission it is my clinical judgment that the patient will require inpatient hospital care spanning beyond 2 midnights from the point of admission due to high intensity of service, high risk for further deterioration and high frequency of surveillance required.    Author: Tully FORBES Carwin, MD 01/14/2024 7:34 PM  For on call review [email or web address].

## 2024-01-14 NOTE — Consult Note (Addendum)
 [facility] Telemedicine Consult   Requesting Provider: Dr. Suzette Consult Participants: Dr. RONAL Lav, Zelda Salmon ER RN [place] Location of the provider: [facility] Location of the patient: [facility]  This consult was provided via telemedicine with 2-way video and audio communication. The patient/family was informed that care would be provided in this way and agreed to receive care in this manner.   Chief Complaint: Right gaze, aphasia  HPI: 58 year old with prior history of stroke with right hemiparesis and also documented history of paraparesis listed in the chart, diabetes, uncontrolled hypertension with noncompliance to medications, brought in for evaluation of strokelike symptoms of right gaze and unresponsiveness as well as aphasia. 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  Parents report last known well to be actually last night 8 PM when she went to bed.  They report that she woke up this morning not feeling well and not talking much at all.  It was not until 12:30 PM, when she woke up after a nap that they discovered that she was not really talking. EMS noticed her to have a rightward gaze as well as inability to follow commands, for which a code stroke was activated since they initially were given her last known well of 12:30 PM but the last known well was 8:30 PM last night.  CT head was performed.  Concern for acute left-sided stroke per radiology but on my review it seemed to be chronic and likely in the same area as her known strokes on prior MRI.  CT angiography was performed with no evidence of LVO.  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  Stat MRI showed right BG/ext capsule stroke.   Past Medical History:  Diagnosis Date   Acute heart failure (HCC)    ADD (attention  deficit disorder)    Bronchitis    Depression    Diabetes mellitus without complication (HCC)    Elevated troponin    Hypertension    Obesity      Current Facility-Administered Medications:    LORazepam  (ATIVAN ) 2 MG/ML injection, , , ,    LORazepam  (ATIVAN ) injection 1 mg, 1 mg, Intravenous, Once, Yossef Gilkison, MD  Current Outpatient Medications:    amLODipine (NORVASC) 10 MG tablet, Take 10 mg by mouth daily. (Patient not taking: Reported on 01/14/2024), Disp: , Rfl:    aspirin  EC 81 MG EC tablet, Take 1 tablet (81 mg total) by mouth daily with breakfast. Swallow whole. (Patient not taking: Reported on 01/14/2024), Disp: 30 tablet, Rfl: 11   atorvastatin  (LIPITOR) 40 MG tablet, Take 40 mg by mouth daily. (Patient not taking: Reported on 01/14/2024), Disp: , Rfl:    buPROPion  (WELLBUTRIN  SR) 150 MG 12 hr tablet, Take 150 mg by mouth daily. (Patient not taking: Reported on 01/14/2024), Disp: , Rfl:    buPROPion  (WELLBUTRIN ) 75 MG tablet, Take 75 mg by mouth every morning. (Patient not taking: Reported on 01/14/2024), Disp: , Rfl:    carvedilol  (COREG ) 12.5 MG tablet, Take 1 tablet (12.5 mg total) by mouth 2 (two) times daily with a meal. (Patient not taking: Reported on 01/14/2024), Disp: 60 tablet, Rfl: 2   clopidogrel  (PLAVIX ) 75 MG tablet, Take 75 mg by mouth daily. (Patient not taking: Reported on 01/14/2024), Disp: , Rfl:    escitalopram  (LEXAPRO ) 20 MG tablet, Take 1 tablet (20 mg total)  by mouth daily. (Patient not taking: Reported on 01/14/2024), Disp: 30 tablet, Rfl: 5   insulin  aspart (NOVOLOG ) 100 UNIT/ML injection, Inject 0-20 Units into the skin 3 (three) times daily with meals. (Patient not taking: Reported on 01/14/2024), Disp: 10 mL, Rfl: 11   insulin  glargine (LANTUS ) 100 UNIT/ML injection, Inject 0.06 mLs (6 Units total) into the skin at bedtime. (Patient not taking: Reported on 01/14/2024), Disp: 10 mL, Rfl: 11   levothyroxine  (SYNTHROID ) 100 MCG tablet, Take 1 tablet (100 mcg  total) by mouth daily at 6 (six) AM. (Patient not taking: Reported on 01/14/2024), Disp: 30 tablet, Rfl: 0   losartan  (COZAAR ) 50 MG tablet, Take 50 mg by mouth daily. (Patient not taking: Reported on 01/14/2024), Disp: , Rfl:    metFORMIN  (GLUCOPHAGE ) 500 MG tablet, Take 1 tablet (500 mg total) by mouth daily with breakfast. (Patient not taking: Reported on 01/14/2024), Disp: 90 tablet, Rfl: 3   ondansetron  (ZOFRAN ) 4 MG tablet, Take 4 mg by mouth every 8 (eight) hours as needed. (Patient not taking: Reported on 01/14/2024), Disp: , Rfl:    potassium chloride  SA (KLOR-CON ) 20 MEQ tablet, Take 1 tablet (20 mEq total) by mouth 2 (two) times daily. (Patient not taking: Reported on 01/14/2024), Disp: 60 tablet, Rfl: 0   spironolactone  (ALDACTONE ) 25 MG tablet, Take 1 tablet (25 mg total) by mouth daily. (Patient not taking: Reported on 01/14/2024), Disp: 90 tablet, Rfl: 3   torsemide  (DEMADEX ) 20 MG tablet, Take 2 tablets (40 mg total) by mouth 2 (two) times daily. (Patient not taking: Reported on 01/14/2024), Disp: 120 tablet, Rfl: 2    LKW: 8:30 PM on 01/13/2024 IV thrombolysis given?: No, outside the window IR Thrombectomy? No, no ELVO Modified Rankin Scale: 4-Needs assistance to walk and tend to bodily needs Time of teleneurologist evaluation: 1320 on camera  Exam: Vitals:   01/14/24 1355 01/14/24 1400  BP: (!) 203/138   Pulse:  (!) 102  Resp: 11 11  SpO2:  96%   CBG 184 by EMS, blood pressure 186/150.  Initial exam on the bridge on arrival She seemed awake but would not respond to commands Her gaze appeared to be forced to the right but when the examiner at the bedside asked her to follow the fingers, she was able to look to the left. She was spontaneously moving the left upper extremity. To noxious stimulation on the right upper extremity, she brought her left arm to swat the hand of the examiner.  To noxious stimulation on both her legs, she again reached out with her left hand to take the  noxious stimulation away but did not really move her legs much.  After the CT scan, she appeared more awake, she was able to nod yes or no appropriately.  She was making moaning sounds and was able to follow commands-open and close eyes to command, stick her tongue out to command and raise her left arm to command.  Did not move her legs much at the second exam as well.   NIHSS 1A: Level of Consciousness - 0 1B: Ask Month and Age - 2 1C: 'Blink Eyes' & 'Squeeze Hands' - 2 2: Test Horizontal Extraocular Movements - 1 3: Test Visual Fields - 0 4: Test Facial Palsy - 0 5A: Test Left Arm Motor Drift - 0 5B: Test Right Arm Motor Drift - 3 6A: Test Left Leg Motor Drift - 3 6B: Test Right Leg Motor Drift - 3 7: Test Limb Ataxia - 0 8:  Test Sensation - 0 9: Test Language/Aphasia- 3 10: Test Dysarthria - 2 11: Test Extinction/Inattention - 0 NIHSS score: 19   Imaging Reviewed: CT head with concern for a 17 mm infarct within the thalamus and posterior limb of the left internal capsule which appears to be new from prior exams.  MRI recommended for further evaluation.  Aspects 9  CT angiography head and neck with severe stenosis of the P1 segment of the right PCA with occlusion of the right P2 segment similar to prior MRI.  Severe stenosis of the proximal P2 segment of the left PCA with tapering and likely occlusion of the distal P2 segment similar to prior MRI.  Severe stenosis of proximal M2 branch and additional moderate stenosis of distal M2 and proximal M3 branches of the left MCA.  Mild atherosclerotic irregularity of multiple M2 branches of the right MCA.  Stat MRI brain-formal read pending-on my review, perforator infarct of the right basal ganglia and external capsule.  Extensive chronic white matter disease, definitely more than expected for age.  Labs reviewed in epic and pertinent values follow: CBC    Component Value Date/Time   WBC 6.6 08/27/2020 0434   RBC 4.04 08/27/2020 0434    HGB 19.0 (H) 01/14/2024 1334   HGB 15.3 09/03/2018 1450   HCT 56.0 (H) 01/14/2024 1334   HCT 42.7 09/03/2018 1450   PLT 192 08/27/2020 0434   PLT 206 09/03/2018 1450   MCV 98.5 08/27/2020 0434   MCV 93 09/03/2018 1450   MCH 31.4 08/27/2020 0434   MCHC 31.9 08/27/2020 0434   RDW 16.4 (H) 08/27/2020 0434   RDW 13.4 09/03/2018 1450   LYMPHSABS 1.5 08/24/2020 1624   LYMPHSABS 1.5 09/03/2018 1450   MONOABS 0.3 08/24/2020 1624   EOSABS 0.2 08/24/2020 1624   EOSABS 0.2 09/03/2018 1450   BASOSABS 0.1 08/24/2020 1624   BASOSABS 0.1 09/03/2018 1450   CMP     Component Value Date/Time   NA 140 01/14/2024 1334   NA 139 09/03/2018 1450   K 3.2 (L) 01/14/2024 1334   CL 99 01/14/2024 1334   CO2 29 09/05/2020 0431   GLUCOSE 170 (H) 01/14/2024 1334   BUN 10 01/14/2024 1334   BUN 15 09/03/2018 1450   CREATININE 1.00 01/14/2024 1334   CALCIUM  9.4 09/05/2020 0431   PROT 6.7 08/25/2020 0349   PROT 7.2 09/03/2018 1450   ALBUMIN 3.2 (L) 08/25/2020 0349   ALBUMIN 4.3 09/03/2018 1450   AST 17 08/25/2020 0349   ALT 20 08/25/2020 0349   ALKPHOS 72 08/25/2020 0349   BILITOT 1.2 08/25/2020 0349   BILITOT 0.4 09/03/2018 1450   GFRNONAA >60 09/07/2020 0544   GFRAA 78 09/03/2018 1450   Assessment:  58 year old with prior history of strokes with right hemiparesis and also paraparesis, diabetes, uncontrolled hypertension with noncompliance to medication brought in from home with last known well initially reported to be 2:30 PM but on further history taking last known well of 8:30 PM last night with changes in mental status, inability to talk and gaze to the right.  Somewhat confusing exam on the camera, where she did have a right gaze preference but was able to look to the left.  Initially not talking much but then able to follow commands.  CT head with concern for hypodensity in the left thalamic capsular region but that is probably from her old stroke.  Seizure versus stroke remain in the  differential.  Outside the window for thrombolysis.  Stat  MRI performed which revealed small vessel etiology in the right hemisphere-right basal ganglia/external capsule.  Etiology of stroke likely uncontrolled risk factors such as hypertension and diabetes.  Impression: Acute ischemic stroke of small vessel etiology Hypertensive emergency   Recommendations:  Admit to the hospitalist ASA 81mg  and Plavix  75mg  - 3 months followed by ASA only Frequent neurochecks Telemetry At this time, allow permissive hypertension and treat only if systolic is greater than 220 on a as needed basis for the next 24 to 48 hours and then start normalizing blood pressures. 2D echo Hemoglobin A1c Lipid panel Therapy assessments N.p.o. until cleared by bedside swallow evaluation or formal swallow evaluation She at baseline has a poor modified Rankin score and hence is not a candidate for interventions even if she had a LVO.  I do not think she would benefit from transferring to [facility].  She can be admitted to [facility] and the on-call neurologist can follow-up in routine consultation via telemedicine tomorrow. Mainstay of her treatment will be management of her small vessel risk factors-hypertension, diabetes and ensuring compliance to medications.  She has had a hard time doing that for various reasons-this needs to be addressed on an ongoing basis with her primary care provider even after discharge.  Discussed with Dr. Suzette, EDP.  -- Eligio Lav, MD Neurologist [facility] Pager: [phone]  CRITICAL CARE ATTESTATION Performed by: Eligio Lav, MD Total critical care time: 45 minutes Critical care time was exclusive of separately billable procedures and treating other patients and/or supervising APPs/Residents/Students Critical care was necessary to treat or prevent imminent or life-threatening deterioration. This patient is critically ill and at significant  risk for neurological worsening and/or death and care requires constant monitoring. Critical care was time spent personally by me on the following activities: development of treatment plan with patient and/or surrogate as well as nursing, discussions with consultants, evaluation of patient's response to treatment, examination of patient, obtaining history from patient or surrogate, ordering and performing treatments and interventions, ordering and review of laboratory studies, ordering and review of radiographic studies, pulse oximetry, re-evaluation of patient's condition, participation in multidisciplinary rounds and medical decision making of high complexity in the care of this patient.

## 2024-01-15 ENCOUNTER — Inpatient Hospital Stay (HOSPITAL_COMMUNITY)

## 2024-01-15 DIAGNOSIS — Z7189 Other specified counseling: Secondary | ICD-10-CM

## 2024-01-15 DIAGNOSIS — E1151 Type 2 diabetes mellitus with diabetic peripheral angiopathy without gangrene: Secondary | ICD-10-CM | POA: Diagnosis not present

## 2024-01-15 DIAGNOSIS — Z558 Other problems related to education and literacy: Secondary | ICD-10-CM

## 2024-01-15 DIAGNOSIS — I639 Cerebral infarction, unspecified: Secondary | ICD-10-CM | POA: Diagnosis not present

## 2024-01-15 DIAGNOSIS — Z515 Encounter for palliative care: Secondary | ICD-10-CM | POA: Diagnosis not present

## 2024-01-15 DIAGNOSIS — Z789 Other specified health status: Secondary | ICD-10-CM

## 2024-01-15 DIAGNOSIS — Z79899 Other long term (current) drug therapy: Secondary | ICD-10-CM | POA: Diagnosis not present

## 2024-01-15 DIAGNOSIS — I6389 Other cerebral infarction: Secondary | ICD-10-CM | POA: Diagnosis not present

## 2024-01-15 DIAGNOSIS — Z66 Do not resuscitate: Secondary | ICD-10-CM

## 2024-01-15 LAB — LIPID PANEL
Cholesterol: 238 mg/dL — ABNORMAL HIGH (ref 0–200)
HDL: 47 mg/dL (ref >40)
LDL Cholesterol: 154 mg/dL — ABNORMAL HIGH (ref 0–99)
Total CHOL/HDL Ratio: 5.1 ratio
Triglycerides: 185 mg/dL — ABNORMAL HIGH (ref <150)
VLDL: 37 mg/dL (ref 0–40)

## 2024-01-15 LAB — ECHOCARDIOGRAM COMPLETE
Area-P 1/2: 5.38 cm2
Calc EF: 28.7 %
Est EF: 25
Height: 65 in
S' Lateral: 3.8 cm
Single Plane A2C EF: 19.6 %
Single Plane A4C EF: 39.8 %
Weight: 2931.24 [oz_av]

## 2024-01-15 LAB — GLUCOSE, CAPILLARY
Glucose-Capillary: 133 mg/dL — ABNORMAL HIGH (ref 70–99)
Glucose-Capillary: 138 mg/dL — ABNORMAL HIGH (ref 70–99)
Glucose-Capillary: 157 mg/dL — ABNORMAL HIGH (ref 70–99)
Glucose-Capillary: 170 mg/dL — ABNORMAL HIGH (ref 70–99)
Glucose-Capillary: 171 mg/dL — ABNORMAL HIGH (ref 70–99)

## 2024-01-15 LAB — TSH: TSH: 5.94 u[IU]/mL — ABNORMAL HIGH (ref 0.350–4.500)

## 2024-01-15 LAB — HIV ANTIBODY (ROUTINE TESTING W REFLEX): HIV Screen 4th Generation wRfx: NONREACTIVE

## 2024-01-15 MED ORDER — LEVOTHYROXINE SODIUM 100 MCG PO TABS
100.0000 ug | ORAL_TABLET | Freq: Every day | ORAL | Status: DC
Start: 2024-01-16 — End: 2024-01-23
  Filled 2024-01-15: qty 1

## 2024-01-15 MED ORDER — HYDROMORPHONE HCL 1 MG/ML IJ SOLN
0.5000 mg | INTRAMUSCULAR | Status: DC | PRN
  Administered 2024-01-15 – 2024-01-26 (×11): 0.5 mg via INTRAVENOUS
  Filled 2024-01-15 (×11): qty 0.5

## 2024-01-15 MED ORDER — PROCHLORPERAZINE EDISYLATE 10 MG/2ML IJ SOLN
10.0000 mg | Freq: Four times a day (QID) | INTRAMUSCULAR | Status: DC | PRN
  Administered 2024-01-15: 10 mg via INTRAVENOUS

## 2024-01-15 MED ORDER — PERFLUTREN LIPID MICROSPHERE
1.0000 mL | INTRAVENOUS | Status: AC | PRN
  Administered 2024-01-15: 2 mL via INTRAVENOUS

## 2024-01-15 MED ORDER — ASPIRIN 300 MG RE SUPP
150.0000 mg | Freq: Once | RECTAL | Status: AC
  Administered 2024-01-15: 150 mg via RECTAL
  Filled 2024-01-15: qty 1

## 2024-01-15 MED ORDER — POTASSIUM CHLORIDE 2 MEQ/ML IV SOLN
INTRAVENOUS | Status: AC
  Filled 2024-01-15 (×2): qty 1000

## 2024-01-15 MED ORDER — PROCHLORPERAZINE EDISYLATE 10 MG/2ML IJ SOLN
INTRAMUSCULAR | Status: AC
  Filled 2024-01-15: qty 2

## 2024-01-15 MED ORDER — ATORVASTATIN CALCIUM 40 MG PO TABS
40.0000 mg | ORAL_TABLET | Freq: Every day | ORAL | Status: DC
Start: 2024-01-15 — End: 2024-01-18

## 2024-01-15 MED ORDER — CLOPIDOGREL BISULFATE 75 MG PO TABS
75.0000 mg | ORAL_TABLET | Freq: Every day | ORAL | Status: DC
Start: 2024-01-15 — End: 2024-01-18

## 2024-01-15 NOTE — Progress Notes (Addendum)
 PROGRESS NOTE    Patient: Connie Shepard                            PCP: Bobbette Coye LABOR, MD                    DOB: 1966/02/17            DOA: 01/14/2024 FMW:[id number]             DOS: 01/15/2024, 11:02 AM   LOS: 1 day   Date of Service: The patient was seen and examined on 01/15/2024  Subjective:   The patient was seen and examined this morning. Awake but nonverbal, Hypertensive otherwise hemodynamically stable,.  No progression of symptoms, remains nonverbal, does not follow commands Right-sided weakness no changes. Per nursing staff no progression of symptoms Unable to swallow or speak...    Brief Narrative:   Connie Shepard is a 58 y.o. female with medical history significant for stroke with baseline Right hemiparesis, DM II, CHF, HTN, hypothyroidism. Patient was brought to the ED via EMS with reports of unresponsiveness, right gaze, and aphasia.  Family reported that patient was last known well at about 8 PM last night when she went to bed.  This morning when she woke up she is not feeling well and was not talking much at all, it was about 12:30 PM after she woke up from a nap and I did notice that she was not talking at all.  EMS noted that she was gazing to the right on the following directions. On my evaluation, patient is awake, but gazing to the right and unable to answer questions.   ED Course: BP 219/155.  Heart rate 102-112.  RR 11-22.   Code stroke called.  Patient outside window for thrombolysis.   Head CT-suggested an acute infarct. Subsequent MRI brain - Acute infarcts in the right corona radiata, caudate, lentiform nucleus, and external capsule. Extensive chronic microvascular ischemic changes, generalized parenchymal volume loss, and multiple remote infarcts. CTA head and neck-severe stenosis in several arteries. Tele-neurology was consulted, okay for patient to be admitted here at [facility].     Assessment & Plan:   Principal Problem:   Acute CVA  (cerebrovascular accident) [facility]) Active Problems:   Essential hypertension   Diabetes mellitus (HCC)   Hypothyroidism   Chronic combined systolic and diastolic congestive heart failure (HCC)     Assessment and Plan:   Acute CVA- -presenting with new aphasia, left-sided hemineglect.  - History of stroke, with baseline right hemiparesis  - MRI brain-  Acute infarcts in the right corona radiata, caudate, lentiform nucleus, and external capsule. Extensive chronic microvascular ischemic changes, generalized parenchymal volume loss, and multiple remote infarcts.   - CTA head and neck reviewed stenosis in multiple vessels. - Code stroke called, patient outside window for thrombolysis - Evaluated by teleneurology-seizure versus stroke remain in differential.   - Aspirin  and Plavix , allow for permissive hypertension and treat for systolic greater than 220 for the next 24 to 48 hours then start normalizing blood pressure, stroke workup,  - Echocardiogram >>>  - PT OT speech therapy eval - NPO -  On Aspirin , Plavix , Lipitor as outpatient - Rectal aspirin  300 mg x 1 - cont. N/s 50 cc/hr x 1 day   Hypertension Plan for permissive hypertension - Holding home medication: Carvedilol  12.5 mg twice daily, Norvasc 10 mg, losartan  50 mg, spironolactone  and torsemide    Diabetes  mellitus -A1c 12/02/2023-8.4.  - SSI- S q6h - Hold home Lantus  while NPO, metformin    Chronic combined systolic and diastolic CHF -stable and compensated.   Last echo 2022 EF 20% with global hypokinesis - Repeating 2D echo - Gentle hydration - Hold torsemide  40 mg twice daily, spironolactone     ------------------------------------------------------------------------------------------------------------------------------ Nutritional status:  The patient's BMI is: -------------------------------------------------------------------------------------------------------------------------------  DVT prophylaxis:   enoxaparin  (LOVENOX ) injection 40 mg Start: 01/14/24 1930   Code Status:   Code Status: Full Code  Family Communication: No family member present at bedside-  -Advance care planning has been discussed.   Admission status:   Status is: Inpatient Remains inpatient appropriate because: Continue need for progressive recurrent stroke, stroke workup   Disposition: From  - home             Planning for discharge in 2 days   Procedures:   No admission procedures for hospital encounter.   Antimicrobials:  Anti-infectives (From admission, onward)    None        Medication:    stroke: early stages of recovery book   Does not apply Once   aspirin   150 mg Rectal Once   atorvastatin   40 mg Oral Daily   Chlorhexidine  Gluconate Cloth  6 each Topical Q0600   clopidogrel   75 mg Oral Daily   enoxaparin  (LOVENOX ) injection  40 mg Subcutaneous Q24H   insulin  aspart  0-9 Units Subcutaneous Q6H   [START ON 01/16/2024] levothyroxine   100 mcg Oral Q0600    acetaminophen  **OR** acetaminophen  (TYLENOL ) oral liquid 160 mg/5 mL **OR** acetaminophen , labetalol , perflutren  lipid microspheres (DEFINITY ) IV suspension, senna-docusate   Objective:   Vitals:   01/15/24 0800 01/15/24 0830 01/15/24 0900 01/15/24 1000  BP: (!) 209/120 (!) 230/128 (!) 182/123 (!) 189/114  Pulse: 80  82 86  Resp: 14 19 16 20   Temp:      TempSrc:      SpO2: 98% 98% 96% 97%  Weight:      Height:        Intake/Output Summary (Last 24 hours) at 01/15/2024 1102 Last data filed at 01/15/2024 0600 Gross per 24 hour  Intake --  Output 300 ml  Net -300 ml   Filed Weights   01/14/24 1635  Weight: 83.1 kg     Physical examination:   General:  Wake, nonverbal, no distress  HEENT:  Normocephalic, PERRL, otherwise with in Normal limits   Neuro:  Nonverbal, right hemiparesis CNII-XII intact. , normal motor and sensation, reflexes intact   Lungs:   Clear to auscultation BL, Respirations unlabored,  No wheezes /  crackles  Cardio:    S1/S2, RRR, No murmure, No Rubs or Gallops   Abdomen:  Soft, non-tender, bowel sounds active all four quadrants, no guarding or peritoneal signs.  Muscular  skeletal:  Limited exam -global generalized weaknesses - in bed, able to move all 4 extremities,   2+ pulses,  symmetric, No pitting edema  Skin:  Dry, warm to touch, negative for any Rashes,  Wounds: Please see nursing documentation       ------------------------------------------------------------------------------------------------------------------------------------------    LABs:     Latest Ref Rng & Units 01/14/2024    1:34 PM 01/14/2024    1:31 PM 08/27/2020    4:34 AM  CBC  WBC 4.0 - 10.5 K/uL  7.8  6.6   Hemoglobin 12.0 - 15.0 g/dL 80.9  80.5  87.2   Hematocrit 36.0 - 46.0 % 56.0  55.9  39.8  Platelets 150 - 400 K/uL  216  192       Latest Ref Rng & Units 01/14/2024    1:34 PM 01/14/2024    1:31 PM 09/07/2020    5:44 AM  CMP  Glucose 70 - 99 mg/dL 829  833    BUN 6 - 20 mg/dL 10  9    Creatinine 9.55 - 1.00 mg/dL 8.99  9.03  9.03   Sodium 135 - 145 mmol/L 140  140    Potassium 3.5 - 5.1 mmol/L 3.2  3.4    Chloride 98 - 111 mmol/L 99  99    CO2 22 - 32 mmol/L  22    Calcium  8.9 - 10.3 mg/dL  89.8    Total Protein 6.5 - 8.1 g/dL  8.1    Total Bilirubin 0.0 - 1.2 mg/dL  0.9    Alkaline Phos 38 - 126 U/L  121    AST 15 - 41 U/L  22    ALT 0 - 44 U/L  25         Micro Results Recent Results (from the past 240 hours)  MRSA Next Gen by PCR, Nasal     Status: None   Collection Time: 01/14/24  4:32 PM   Specimen: Nasal Mucosa; Nasal Swab  Result Value Ref Range Status   MRSA by PCR Next Gen NOT DETECTED NOT DETECTED Final    Comment: (NOTE) The GeneXpert MRSA Assay (FDA approved for NASAL specimens only), is one component of a comprehensive MRSA colonization surveillance program. It is not intended to diagnose MRSA infection nor to guide or monitor treatment for MRSA  infections. Test performance is not FDA approved in patients less than 29 years old. Performed at [facility], [address], [place], [place] [address]     Radiology Reports MR BRAIN WO CONTRAST Result Date: 01/14/2024 EXAM: MRI BRAIN WITHOUT CONTRAST 01/14/2024 02:27:37 PM TECHNIQUE: Multiplanar multisequence MRI of the head/brain was performed without the administration of intravenous contrast. COMPARISON: Same day CT head and CTA head and neck. CLINICAL HISTORY: Neuro deficit, acute, stroke suspected. Neuro deficit, acute. FINDINGS: BRAIN AND VENTRICLES: Linear focus of acute infarct extending from the right corona radiata and body of the caudate into the posterior aspect of the lentiform nucleus. Additional small focus of acute infarct within the right external capsule. Extensive chronic microvascular ischemic changes. Generalized parenchymal volume loss. Remote infarcts in the pons. Multiple remote lacunar infarcts in the basal ganglia and thalami. Small remote infarcts in the bilateral cerebellum. Additional small remote lacunar infarcts in the right external capsule. Encephalomalacia in the left occipital lobe suggestive of remote infarct. 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 There are multiple scattered areas of susceptibility in the bilateral cerebral hemispheres with additional areas in the bilateral basal ganglia and thalami and in the left cerebellum. The distribution of chronic microhemorrhages suggests cerebral amyloid angiopathy versus possible component of hypertensive encephalopathy. No intracranial hemorrhage. No mass. No midline shift. No hydrocephalus. The sella is unremarkable. Normal flow voids. ORBITS: No acute abnormality. SINUSES AND MASTOIDS: No acute abnormality. BONES AND SOFT TISSUES: Normal marrow signal. No acute soft tissue abnormality. IMPRESSION: 1. Acute infarcts in the right corona radiata, caudate,  lentiform nucleus, and external capsule. 2. Extensive chronic microvascular ischemic changes, generalized parenchymal volume loss, and multiple remote infarcts. 3. 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 Electronically signed by: Donnice  Hunt MD 01/14/2024 02:43 PM EDT RP Workstation: [id number]   CT ANGIO HEAD NECK W WO CM (CODE STROKE) Result Date: 01/14/2024 EXAM: CTA Head and Neck with Intravenous Contrast. CT Head without Contrast. CLINICAL HISTORY: Stroke, follow up. Pt BIB ems for CODE STROKE. Per EMS pt was LKN 1230 by caregiver. Pt has hx of previous strokes with rt arm deficits. Pt has rt gaze, rt side facial droop, aphasic. Per EMS pt is normally talking and A \\T \ O times 4. TECHNIQUE: Axial CTA images of the head and neck performed with intravenous contrast. MIP reconstructed images were created and reviewed. Axial computed tomography images of the head/brain performed without intravenous contrast. Note: Per PQRS, the description of internal carotid artery percent stenosis, including 0 percent or normal exam, is based on [place] American Symptomatic Carotid Endarterectomy Trial (NASCET) criteria. Dose reduction technique was used including one or more of the following: automated exposure control, adjustment of mA and kV according to patient size, and/or iterative reconstruction. CONTRAST: Without and with; 75 mL iohexol  (OMNIPAQUE ) 350 MG/ML injection. COMPARISON: CT head dated 01/14/2024 and MRI dated 08/24/2020. FINDINGS: CT HEAD: BRAIN: No acute intraparenchymal hemorrhage. No mass lesion. No CT evidence for acute territorial infarct. No midline shift or extra-axial collection. VENTRICLES: No hydrocephalus. ORBITS: The orbits are unremarkable. SINUSES AND MASTOIDS: The paranasal sinuses and mastoid air cells are clear. CTA NECK: COMMON CAROTID ARTERIES: There is mild atherosclerosis at the  right carotid bifurcation without hemodynamically significant stenosis. No dissection or occlusion. INTERNAL CAROTID ARTERIES: No stenosis by NASCET criteria. No dissection or occlusion. VERTEBRAL ARTERIES: Atherosclerosis involving the V4 segments of both vertebral arteries. There is mild stenosis of the proximal right V4 segment and mild to moderate stenosis of the proximal left V4 segment. No dissection or occlusion. CTA HEAD: ANTERIOR CEREBRAL ARTERIES: The anterior cerebral arteries are patent bilaterally. No significant stenosis. No occlusion. No aneurysm. MIDDLE CEREBRAL ARTERIES: The middle cerebral arteries are patent bilaterally. There is mild atherosclerotic irregularity of multiple M2 branches of the right MCA. Severe stenosis of a proximal M2 branch of the left MCA. Additional moderate stenosis of a distal M2/proximal M3 branch of the left MCA within the posterior aspect of the left MCA territory. No aneurysm. POSTERIOR CEREBRAL ARTERIES: There is severe stenosis of the P1 segment right PCA with occlusion of the right P2 segment which appears similar to the prior MRI. There are minimal distal branches of the right PCA visualized. Similar severe stenosis of the proximal P2 segment of the left PCA. There is tapering and likely occlusion of the distal P2 segment of the left PCA similar to the prior MRI. Minimal branches of the left PCA visualized. No aneurysm. BASILAR ARTERY: No significant stenosis. No occlusion. No aneurysm. OTHER: The intracranial internal carotid arteries are patent bilaterally. There is atherosclerosis of the carotid siphons. Mild stenosis of the bilateral supraclinoid ICA, slightly greater on the right. SOFT TISSUES: No acute finding. No masses or lymphadenopathy. BONES: Lucency surrounding an unerupted right mandibular molar. No acute osseous abnormality. IMPRESSION: 1. No evidence of acute large vessel occlusion. 2. Severe stenosis of the P1 segment right PCA with occlusion of the  right P2 segment, similar to prior MRA. Severe stenosis of the proximal P2 segment of the left PCA with tapering and likely occlusion of the distal P2 segment, similar to prior MRA. 3. Severe stenosis of a proximal M2 branch and additional moderate stenosis of a distal M2/proximal M3 branch of the left MCA. 4. Mild atherosclerotic irregularity  of multiple M2 branches of the right MCA. Electronically signed by: Donnice Mania MD 01/14/2024 02:17 PM EDT RP Workstation: [id number]   CT HEAD CODE STROKE WO CONTRAST Result Date: 01/14/2024 CLINICAL DATA:  Code stroke. Neuro deficit, acute, stroke suspected. EXAM: CT HEAD WITHOUT CONTRAST TECHNIQUE: Contiguous axial images were obtained from the base of the skull through the vertex without intravenous contrast. RADIATION DOSE REDUCTION: This exam was performed according to the departmental dose-optimization program which includes automated exposure control, adjustment of the mA and/or kV according to patient size and/or use of iterative reconstruction technique. COMPARISON:  Brain MRI 08/24/2020.  Head CT 08/24/2020. FINDINGS: Brain: Generalized cerebral and cerebellar atrophy. 17 mm infarct within the thalamus and posterior limb of internal capsule on the left, new from the prior head CT of 08/24/2020. Known chronic lacunar infarcts elsewhere within the genu and posterior limb of left internal capsule, and within the right thalamus. Redemonstrated chronic cortical/subcortical infarct within the right occipital lobe. Chronic cortical/subcortical infarct within the left occipital lobe, new from the prior head CT. Background moderate patchy and ill-defined hypoattenuation within the cerebral white matter, nonspecific but compatible chronic small vessel ischemic disease. There is no acute intracranial hemorrhage. No extra-axial fluid collection. No evidence of an intracranial mass. No midline shift. Vascular: No hyperdense vessel.  Atherosclerotic calcifications. Skull: No  calvarial fracture or aggressive osseous lesion. Sinuses/Orbits: No mass or acute finding within the imaged orbits. No significant paranasal sinus disease at the imaged levels. ASPECTS [facility] Stroke Program Early CT Score) - Ganglionic level infarction (caudate, lentiform nuclei, internal capsule, insula, M1-M3 cortex): 6 - Supraganglionic infarction (M4-M6 cortex): 3 Total score (0-10 with 10 being normal): 9 (when discounting chronic infarcts). Impression #1 communicated to Dr. Voncile at 2:00 pmon 9/30/2025by text page via the [facility] messaging system. IMPRESSION: 1. 17 mm infarct within the thalamus and posterior limb of internal capsule on the left, new from prior exams and potentially acute. Consider a brain MRI for further evaluation. 2. Background parenchymal atrophy, chronic small vessel ischemic disease and chronic infarcts, as described. Electronically Signed   By: Rockey Childs D.O.   On: 01/14/2024 14:01    SIGNED: Adriana DELENA Grams, MD, FHM. FAAFP. Jolynn Pack - [facility] Critical care time spent - 55 min.  In seeing, evaluating and examining the patient. Reviewing medical records, labs, drawn plan of care. [facility],  Pager (please use [email or web address] to page/ text) Please use Epic Secure Chat for non-urgent communication (7AM-7PM)  If 7PM-7AM, please contact night-coverage [email or web address], 01/15/2024, 11:02 AM

## 2024-01-15 NOTE — TOC PASRR Note (Signed)
 DOB: 07/24/1965 Date: 01/15/2024   Must ID: [id number]   To Whom it May Concern:  Please be advised that the above named patient will require a short-term nursing home stay- anticipated 30 days or less rehabilitation and strengthening. The plan is for return home.

## 2024-01-15 NOTE — Evaluation (Addendum)
 Clinical/Bedside Swallow Evaluation Patient Details  Name: Connie Shepard MRN: [id number] Date of Birth: [birth year]  Today's Date: 01/15/2024 Time: SLP Start Time (ACUTE ONLY): 1530 SLP Stop Time (ACUTE ONLY): 1608 SLP Time Calculation (min) (ACUTE ONLY): 38 min  Past Medical History:  Past Medical History:  Diagnosis Date   Acute heart failure (HCC)    ADD (attention deficit disorder)    Bronchitis    Depression    Diabetes mellitus without complication (HCC)    Elevated troponin    Hypertension    Obesity    Past Surgical History:  Past Surgical History:  Procedure Laterality Date   CESAREAN SECTION     HPI:  Connie Shepard is a 58 y.o. female with medical history significant for stroke with baseline right hemiparesis, diabetes mellitus, CHF, hypertension, hypothyroidism.  Patient was brought to the ED via EMS with reports of unresponsiveness, right gaze, and aphasia.  Family reported that patient was last known well at about 8 PM last night when she went to bed.  This morning when she woke up she is not feeling well and was not talking much at all, it was about 12:30 PM after she woke up from a nap and I did notice that she was not talking at all.  EMS noted that she was gazing to the right on the following directions.  BSE requested. Pt with acute CVA   Assessment / Plan / Recommendation  Clinical Impression  Clinical swallowing evaluation completed while Pt was sitting upright in bed. Pt groans occasionally but did not speak or make any other vocalizations. Pt did not shake her head yes/no or follow any commands. Pt seemingly shifted her gaze intentionally. Pt's facial affect is completely flat with no response despite much cueing and stimulation. Pt was presented a single ice chiip which immediately fell anteriorly out of the oral cavity. When placed farther back on the tongue Pt attempted to manipulate and swallow single ice chip; Pt swallowed multiple times with audible wetness.  With tsp sips of thin liquid Pt was unable to orally contain bolus and suspect delayed swallow followed by a groan; Question if Pt is able to cough and groan was attempt at clearing possible penetration/aspiration. Presented very small tsp bite of applesauce which Pt attempted to move posteriorly on lingual surface. She ultimately expectorated puree and SLP manually removed remaining puree. Pt unable to seal labial surface. Further note Pt is unable to hold her head up and SLP Had to physically support head to keep it in appropriate position for swallowing. Pt is unsafe and inappropriate for PO of any kind at this time.   Pt's parents at bedside do not want alternative means of nutrition and would like to discuss options with MD. Recommend continue NPO and oral care QID. Pt is very high risk for aspiration. Pt is unable to consume enough for MBS to be of benefit at this time, ST will continue efforts and will recommend objective measure when/if appropriate.  **Recommend consider Palliative consult to facilitate goals of care discussion.   SLP Visit Diagnosis: Dysphagia, unspecified (R13.10)    Aspiration Risk  Severe aspiration risk;Risk for inadequate nutrition/hydration    Diet Recommendation NPO    Medication Administration: Via alternative means     Frequency and Duration min 2x/week  1 week        Swallow Study   General HPI: Connie Shepard is a 58 y.o. female with medical history significant for stroke with baseline right hemiparesis, diabetes mellitus,  CHF, hypertension, hypothyroidism.  Patient was brought to the ED via EMS with reports of unresponsiveness, right gaze, and aphasia.  Family reported that patient was last known well at about 8 PM last night when she went to bed.  This morning when she woke up she is not feeling well and was not talking much at all, it was about 12:30 PM after she woke up from a nap and I did notice that she was not talking at all.  EMS noted that she was  gazing to the right on the following directions.  BSE requested Type of Study: Bedside Swallow Evaluation Previous Swallow Assessment: MBSS 2022 - d3/thin Diet Prior to this Study: NPO Temperature Spikes Noted: No Respiratory Status: Room air History of Recent Intubation: No Behavior/Cognition: Alert;Cooperative;Doesn't follow directions;Requires cueing Oral Cavity Assessment: Dry Oral Care Completed by SLP: Yes Oral Cavity - Dentition: Adequate natural dentition Self-Feeding Abilities: Total assist Patient Positioning: Upright in bed Baseline Vocal Quality: Normal Volitional Cough: Strong Volitional Swallow: Able to elicit    Oral/Motor/Sensory Function Overall Oral Motor/Sensory Function: Other (comment) (unable to follow commands)   Ice Chips Presentation: Spoon   Thin Liquid Thin Liquid: Impaired Presentation: Spoon Oral Phase Impairments: Reduced labial seal;Impaired mastication;Reduced lingual movement/coordination;Poor awareness of bolus Oral Phase Functional Implications: Oral holding Pharyngeal  Phase Impairments: Wet Vocal Quality;Throat Clearing - Delayed;Multiple swallows    Nectar Thick Nectar Thick Liquid: Not tested   Honey Thick Honey Thick Liquid: Not tested   Puree Puree: Impaired Presentation: Spoon Oral Phase Impairments: Reduced labial seal;Reduced lingual movement/coordination;Impaired mastication;Poor awareness of bolus Oral Phase Functional Implications: Oral residue;Oral holding Pharyngeal Phase Impairments: Multiple swallows;Throat Clearing - Delayed   Solid     Solid: Not tested     Connie Shepard, CCC-SLP Speech Language Pathologist  Connie Shepard 01/15/2024,4:09 PM

## 2024-01-15 NOTE — TOC Initial Note (Signed)
 Transition of Care [facility]) - Initial/Assessment Note    Patient Details  Name: Connie Shepard MRN: [id number] Date of Birth: 11/29/1965  Transition of Care [facility]) CM/SW Contact:    Mcarthur Saddie Kim, LCSW Phone Number: 01/15/2024, 10:42 AM  Clinical Narrative: Pt admitted due to acute CVA. Assessment completed with pt's parents at bedside. Pt lives with parents. They report pt had a stroke about 2 years ago and has pretty much been bed bound except going to MD appointments. She has an aid 3 hours a day. PT evaluated pt and recommend SNF. LCSW discussed with parents who are agreeable and request [place] or [place]. Reviewed Medicare.gov ratings. Will initiate bed search. Palliative consult pending.                   Expected Discharge Plan: Skilled Nursing Facility Barriers to Discharge: Continued Medical Work up   Patient Goals and CMS Choice Patient states their goals for this hospitalization and ongoing recovery are:: SNF CMS Medicare.gov Compare Post Acute Care list provided to:: Patient Represenative (must comment) (parents) Choice offered to / list presented to : Parent [place] ownership interest in [facility].provided to:: Parent NA    Expected Discharge Plan and Services In-house Referral: Clinical Social Work   Post Acute Care Choice: Skilled Nursing Facility Living arrangements for the past 2 months: Single Family Home                                      Prior Living Arrangements/Services Living arrangements for the past 2 months: Single Family Home Lives with:: Parents Patient language and need for interpreter reviewed:: Yes Do you feel safe going back to the place where you live?: Yes      Need for Family Participation in Patient Care: Yes (Comment) Care giver support system in place?: Yes (comment) Current home services: DME (hospital bed, wheelchair) Criminal Activity/Legal Involvement Pertinent to Current Situation/Hospitalization: No -  Comment as needed  Activities of Daily Living   ADL Screening (condition at time of admission) Independently performs ADLs?: No Does the patient have a NEW difficulty with bathing/dressing/toileting/self-feeding that is expected to last >3 days?: No Does the patient have a NEW difficulty with getting in/out of bed, walking, or climbing stairs that is expected to last >3 days?: No Does the patient have a NEW difficulty with communication that is expected to last >3 days?: Yes (Initiates electronic notice to provider for possible SLP consult) Is the patient deaf or have difficulty hearing?: No Does the patient have difficulty seeing, even when wearing glasses/contacts?: No Does the patient have difficulty concentrating, remembering, or making decisions?: No  Permission Sought/Granted                  Emotional Assessment   Attitude/Demeanor/Rapport: Unable to Assess Affect (typically observed): Unable to Assess   Alcohol / Substance Use: Not Applicable Psych Involvement: No (comment)  Admission diagnosis:  Acute ischemic stroke [facility]) [I63.9] Acute CVA (cerebrovascular accident) [facility]) [I63.9] Patient Active Problem List   Diagnosis Date Noted   Chronic combined systolic and diastolic congestive heart failure (HCC) 08/25/2020   Acute CVA (cerebrovascular accident) (HCC) 08/24/2020   Borderline prolonged QT interval 08/24/2020   Type 2 diabetes mellitus with complication, without long-term current use of insulin  (HCC) 03/16/2020   Anasarca    Diabetes mellitus (HCC)    Hypokalemia    Vitamin D  deficiency  Hypothyroidism    Physical deconditioning    Acute on chronic systolic CHF (congestive heart failure) (HCC) 03/09/2020   Acute on chronic systolic HF (heart failure) (HCC) 03/09/2020   Hyperglycemia 12/22/2018   Hypertensive crisis 04/04/2018   Acute CHF (congestive heart failure) (HCC) 04/04/2018   Mild renal insufficiency 04/04/2018   Elevated troponin 04/04/2018    Bronchitis    Obesity    Seborrheic dermatitis of scalp 05/23/2016   Essential hypertension 12/02/2015   Depression 12/02/2015   Primary insomnia 12/02/2015   Morbid obesity with body mass index of 50.0-59.9 in adult [facility]) 12/02/2015   Attention deficit hyperactivity disorder (ADHD), predominantly inattentive type 12/02/2015   PCP:  Bobbette Coye LABOR, MD Pharmacy:   [facility] [place] [place] - [address] [place] [address] Phone: [phone] Fax: [phone]     Social Drivers of Health (SDOH) Social History: SDOH Screenings   Food Insecurity: No Food Insecurity (01/14/2024)  Housing: Low Risk  (01/14/2024)  Transportation Needs: No Transportation Needs (01/14/2024)  Utilities: Not At Risk (01/14/2024)  Depression (PHQ2-9): Medium Risk (04/30/2018)  Financial Resource Strain: Low Risk  (05/07/2023)   Received from [facility]  Physical Activity: Unknown (05/07/2023)   Received from [facility]  Social Connections: Somewhat Isolated (05/07/2023)   Received from [facility]  Stress: No Stress Concern Present (05/07/2023)   Received from [facility]  Tobacco Use: Low Risk  (01/14/2024)  Health Literacy: Medium Risk (01/10/2021)   Received from [facility]   SDOH Interventions:     Readmission Risk Interventions     No data to display

## 2024-01-15 NOTE — Plan of Care (Signed)
  Problem: Acute Rehab OT Goals (only OT should resolve) Goal: Pt. Will Perform Grooming Flowsheets (Taken 01/15/2024 1108) Pt Will Perform Grooming:  with mod assist  sitting Goal: Pt. Will Perform Upper Body Bathing Flowsheets (Taken 01/15/2024 1108) Pt Will Perform Upper Body Bathing:  with mod assist  sitting Goal: Pt. Will Perform Upper Body Dressing Flowsheets (Taken 01/15/2024 1108) Pt Will Perform Upper Body Dressing:  with mod assist  sitting Goal: Pt. Will Perform Lower Body Dressing Flowsheets (Taken 01/15/2024 1108) Pt Will Perform Lower Body Dressing:  with mod assist  with adaptive equipment  bed level Goal: Pt. Will Transfer To Toilet Flowsheets (Taken 01/15/2024 1108) Pt Will Transfer to Toilet:  with max assist  stand pivot transfer  with mod assist Goal: Pt/Caregiver Will Perform Home Exercise Program Flowsheets (Taken 01/15/2024 1108) Pt/caregiver will Perform Home Exercise Program:  Increased ROM  Increased strength  Both right and left upper extremity  With minimal assist  Norissa Bartee OT, MOT

## 2024-01-15 NOTE — Progress Notes (Signed)
 Initial Nutrition Assessment  DOCUMENTATION CODES:   Obesity unspecified  INTERVENTION:   -RD will follow for goals of care. If TF is indicated, recommend:  Initiate Osmolite 1.5 @ 25 ml/hr and increase by 10 ml every 12 hours to goal rate of 45 ml/hr.   60 ml Prosource TF20 daily  Tube feeding regimen provides 1700 kcal (100% of needs), 88 grams of protein, and 823 ml of H2O.    -Monitor Mg, K, and Phos and replete as needed secondary to high refeeding risk -MVI with minerals daily via tube -100 mg thiamine daily x 7 days  NUTRITION DIAGNOSIS:   Inadequate oral intake related to inability to eat as evidenced by NPO status.  GOAL:   Patient will meet greater than or equal to 90% of their needs  MONITOR:   Diet advancement  REASON FOR ASSESSMENT:   Malnutrition Screening Tool    ASSESSMENT:   Pt with medical history significant for stroke with baseline right hemiparesis, diabetes mellitus, CHF, hypertension, hypothyroidism. Pt admitted with with reports of unresponsiveness, right gaze, and aphasia.  Pt admitted with acute CVA.   10/1- s/p BSE- NPO  Reviewed I/O's: -300 ml x 24 hours  UOP: 300 ml x 24 hours  Case discussed in interdisciplinary rounds.   Per MD, pt with lt sided stroke and rt sided hemiparesis. Pt had last stroke approximately 2 years ago and has been essentially bedbound since then.   Per MD, pt is currently worse than baseline. Pt is unable to speak or swallow and cannot take medications. Per MD, pt will likely require TF pending swallow evaluation.   Likely plan for home vs SNF.   Palliative care consult pending for goals of care discussions.   No recent wt history available to assess at this time.   Medications reviewed and include lovenox  and lactated ringers  with potassium chloride  @ 50 ml/hr.   Lab Results  Component Value Date   HGBA1C 9.8 (H) 08/25/2020   PTA DM medications are 500 mg metformin  daily, 0-20 units insulin  aspart  TID with meals and 6 units insulin  glargine daily.   Labs reviewed: CBGS: 133-170 (inpatient orders for glycemic control are 0-9 insulin  aspart every 6 hours).    Diet Order:   Diet Order             Diet NPO time specified  Diet effective now                   EDUCATION NEEDS:   No education needs have been identified at this time  Skin:  Skin Assessment: Reviewed RN Assessment  Last BM:  Unknown  Height:   Ht Readings from Last 1 Encounters:  01/14/24 5' 5 (1.651 m)    Weight:   Wt Readings from Last 1 Encounters:  01/14/24 83.1 kg    Ideal Body Weight:  56.8 kg  BMI:  Body mass index is 30.49 kg/m.  Estimated Nutritional Needs:   Kcal:  1700-1900  Protein:  85-100 grams  Fluid:  1.7-1.9 L    Margery ORN, RD, LDN, CDCES Registered Dietitian III Certified Diabetes Care and Education Specialist If unable to reach this RD, please use RD Inpatient group chat on secure chat between hours of 8am-4 pm daily

## 2024-01-15 NOTE — Plan of Care (Signed)
  Problem: Acute Rehab PT Goals(only PT should resolve) Goal: Pt Will Go Supine/Side To Sit Outcome: Progressing Flowsheets (Taken 01/15/2024 1445) Pt will go Supine/Side to Sit: with moderate assist Goal: Patient Will Perform Sitting Balance Outcome: Progressing Flowsheets (Taken 01/15/2024 1445) Patient will perform sitting balance: with moderate assist Goal: Patient Will Transfer Sit To/From Stand Outcome: Progressing Flowsheets (Taken 01/15/2024 1445) Patient will transfer sit to/from stand: with moderate assist Goal: Pt Will Transfer Bed To Chair/Chair To Bed Outcome: Progressing Flowsheets (Taken 01/15/2024 1445) Pt will Transfer Bed to Chair/Chair to Bed:  with mod assist  with max assist Goal: Pt Will Ambulate Outcome: Progressing Flowsheets (Taken 01/15/2024 1445) Pt will Ambulate:  10 feet  with moderate assist  with maximum assist  hemi walker, quad-cane  2:46 PM, 01/15/24 Lynwood Music, MPT Physical Therapist with [facility] [phone] office [phone] mobile phone

## 2024-01-15 NOTE — Progress Notes (Signed)
  Echocardiogram 2D Echocardiogram has been performed.  [place] R 01/15/2024, 10:01 AM

## 2024-01-15 NOTE — Progress Notes (Signed)
 I connected with  Connie Shepard on 01/15/24 by a video enabled telemedicine application and verified that I am speaking with the correct person using two identifiers.   I discussed the limitations of evaluation and management by telemedicine. The patient's parents expressed understanding and agreed to proceed.  Location of the provider: [facility] Location of the patient: [facility]   Subjective: Per parents she is more awake and able to follow some commands today. States she has had issues with swallowing so has not been able to take her meds consistently  ROS: Unable to obtain due to poor mental status  Examination  Vital signs in last 24 hours: Temp:  [96.9 F (36.1 C)-98.2 F (36.8 C)] 97.7 F (36.5 C) (10/01 1123) Pulse Rate:  [52-111] 85 (10/01 1530) Resp:  [12-29] 18 (10/01 1530) BP: (167-245)/(102-176) 176/111 (10/01 1530) SpO2:  [91 %-100 %] 99 % (10/01 1530) Weight:  [83.1 kg] 83.1 kg (09/30 1635)  General: lying in bed, NAD Neuro: Awake, looks at examiner, nods appropriately for her name but not for location ( home vs hospital), doesn't consistently follow commands, PERLA, antigravity strength in all extremities with right hemiparesis  Basic Metabolic Panel: Recent Labs  Lab 01/14/24 1331 01/14/24 1334  NA 140 140  K 3.4* 3.2*  CL 99 99  CO2 22  --   GLUCOSE 166* 170*  BUN 9 10  CREATININE 0.96 1.00  CALCIUM  10.1  --     CBC: Recent Labs  Lab 01/14/24 1331 01/14/24 1334  WBC 7.8  --   NEUTROABS 4.8  --   HGB 19.4* 19.0*  HCT 55.9* 56.0*  MCV 89.4  --   PLT 216  --      Coagulation Studies: Recent Labs    01/14/24 1331  LABPROT 12.9  INR 0.9    Imaging personally reviewed   CTH wo contrast 01/14/2024:  17 mm infarct within the thalamus and posterior limb of internal capsule on the left, new from prior exams and potentially acute. Background parenchymal atrophy, chronic small vessel ischemic disease and chronic infarcts, as  described.  CTA head and neck w and wo contrast 01/14/2024:  No evidence of acute large vessel occlusion. 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 Severe stenosis of the proximal P2 segment of the left PCA with tapering and likely occlusion of the distal P2 segment, similar to prior MRA. Severe stenosis of a proximal M2 branch and additional moderate stenosis of a distal M2/proximal M3 branch of the left MCA. Mild atherosclerotic irregularity of multiple M2 branches of the right MCA.  MRI brain wo contrast 01/14/2024: Acute infarcts in the right corona radiata, caudate, lentiform nucleus, and external capsule. Extensive chronic microvascular ischemic changes, generalized parenchymal volume loss, and multiple remote infarcts. 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  TTE 01/15/2024: Left ventricular ejection fraction, by estimation, is 25%. The left  ventricle has severely decreased function. The left ventricle demonstrates  regional wall motion abnormalities (see scoring diagram/findings for  description) consistent with likely  Takotsubo cardiomyopathy although echo images are suboptimal despite  Definity . No LV thrombus. There is severe concentric left ventricular  hypertrophy of the septal segment. Left ventricular diastolic parameters  are indeterminate.   2. Right ventricular systolic function is mildly reduced. The right  ventricular size is normal. Tricuspid regurgitation signal is inadequate  for assessing  PA pressure.   3. The mitral valve is grossly normal. No evidence of mitral valve  regurgitation. No evidence of mitral stenosis.   4. The aortic valve was not well visualized. Aortic valve regurgitation  is not visualized. No aortic stenosis is present.   5. The inferior vena cava is normal in size with greater than 50%   respiratory variability, suggesting right atrial pressure of 3 mmHg.    ASSESSMENT AND PLAN: 58 year old with prior history of strokes with right hemiparesis and also paraparesis, diabetes, uncontrolled hypertension with noncompliance to medication brought in from home with changes in mental status, inability to talk and gaze to the right.   Acute ischemic stroke - likely small vessel disease - Recommend ASA 81mg  and plavix  75mg  daily for 3 months followed by ASA 325mg  daily. OK to use rectal asa 150mg  if PO not available - Atorva 40mg  daily - PT/OT/ speech and swallow therapy - Goal BP: normotension - Rest per primary team    I personally spent a total of 36 minutes in the care of the patient today including getting/reviewing separately obtained history, performing a medically appropriate exam/evaluation, counseling and educating, placing orders, referring and communicating with other health care professionals, documenting clinical information in the EHR, independently interpreting results, and coordinating care.         Arlin Krebs Epilepsy [facility] For questions after 5pm please refer to AMION to reach the Neurologist on call

## 2024-01-15 NOTE — Progress Notes (Signed)
 Parents at bedside. Discussed the use of NG tube with them. They wish to not put the tube down at this time. I talked about the risks/benefits of using the tube in the short term. Told them to continuing thinking about the options. As of right now they do not want the NG tube placed. Patient unable to take anything orally at this time. Fluids are running peripherally. Will continue to discuss the situation with them as needed.

## 2024-01-15 NOTE — Hospital Course (Addendum)
 Connie Shepard is a 58 y.o. female with medical history significant for stroke with baseline Right hemiparesis, DM II, CHF, HTN, hypothyroidism. Patient was brought to the ED via EMS with reports of unresponsiveness, right gaze, and aphasia.  Family reported that patient was last known well at about 8 PM last night when she went to bed.  This morning when she woke up she is not feeling well and was not talking much at all, it was about 12:30 PM after she woke up from a nap and I did notice that she was not talking at all.  EMS noted that she was gazing to the right on the following directions. On my evaluation, patient is awake, but gazing to the right and unable to answer questions.   ED Course: BP 219/155.  Heart rate 102-112.  RR 11-22.   Code stroke called.  Patient outside window for thrombolysis.   Head CT-suggested an acute infarct. Subsequent MRI brain - Acute infarcts in the right corona radiata, caudate, lentiform nucleus, and external capsule. Extensive chronic microvascular ischemic changes, generalized parenchymal volume loss, and multiple remote infarcts. CTA head and neck-severe stenosis in several arteries. Tele-neurology was consulted, okay for patient to be admitted here at [facility].     Assessment & Plan:   Principal Problem:   Acute CVA (cerebrovascular accident) [facility]) Active Problems:   Essential hypertension   Diabetes mellitus (HCC)   Hypothyroidism   Chronic combined systolic and diastolic congestive heart failure (HCC)     Assessment and Plan:   Acute CVA- -presenting with new aphasia, left-sided hemineglect.  Remain nonverbal, unable to swallow, awake   - History of stroke, with baseline right hemiparesis  - MRI brain-  Acute infarcts in the right corona radiata, caudate, lentiform nucleus, and external capsule. Extensive chronic microvascular ischemic changes, generalized parenchymal volume loss, and multiple remote infarcts.   - CTA head and neck reviewed  stenosis in multiple vessels. - Code stroke was called on arrival, patient was outside thrombolytics - This was evaluation by neurology recommended aspirin , Plavix , high-dose statin but patient is unable to tolerate p.o., receiving aspirin  rectally  - Extensive aphasia, with dysphagia--monitoring continue neurochecks Prognosis remain poor due to recurrent stroke-severe dysphagia Palliative care consulted-patient's caregiver mom and dad, determined patient to be DNR  - Per speech unable to swallow-made n.p.o.-to determine modes of feeding -[facility] palliative following closely    - TOC consulted, debating for SNF placement, continue with palliative care, if decline hospice  - Aspirin  and Plavix , allow for permissive hypertension and treat for systolic greater than 220 for the next 24 to 48 hours then start normalizing blood pressure, stroke workup,  - Echocardiogram >>>  EF estimated 25%, severe decreased LV function, finding consistent with Takotsubo cardiomyopathy.  Severe LVH - PT OT speech therapy eval-SNF unable to swallow, considering feeding tube - NPO -  On Aspirin , Plavix , Lipitor as outpatient?  Unable to take meds for past few weeks per caregivers  -Continue aspirin  rectally -Continue IV fluid for hydration    Hypertension Plan for permissive hypertension --will slowly treat hypertension now - Holding home medication: Unable to tolerate p.o. Carvedilol  12.5 mg twice daily, Norvasc 10 mg, losartan  50 mg, spironolactone  and torsemide    Diabetes mellitus -A1c 12/02/2023-8.4.  - SSI- S q6h - Hold home Lantus  while NPO, metformin    Chronic combined systolic and diastolic CHF -stable and compensated.   Last echo 2022 EF 20% with global hypokinesis - Repeating 2D Echo: EF estimated 25%, severe  decreased LV function, finding consistent with Takotsubo cardiomyopathy.  Severe LVH - Gentle hydration - Hold torsemide  40 mg twice daily, spironolactone   Ethics: Changed to DNR  limited Palliative care consulted, mom and dad present at bedside.  Recurrent stroke, with progressive neurological changes on the new stroke complicated by aphasia, dysphagia-therefore prognosis is poor.  If patient continues to decline may be a candidate for hospice.  If stabilized,  entertaining the possibility of PEG tube placement

## 2024-01-15 NOTE — [place] FL2 (Signed)
 [place]  MEDICAID FL2 LEVEL OF CARE FORM     IDENTIFICATION  [name] Birthdate: 27-Jan-1966 Sex: female Admission Date (Current Location): 01/14/2024  [place] and [place] Number:  [name] [id number] [facility] Facility and Address:  [facility],  [address], [place] [address]      Provider Number: [id number]  Attending Physician Name and Address:  Willette Adriana LABOR, MD  Relative Name and Phone Number:       Current Level of Care: Hospital Recommended Level of Care: Skilled Nursing Facility Prior Approval Number:    Date Approved/Denied:   PASRR Number: pending  Discharge Plan: SNF    Current Diagnoses: Patient Active Problem List   Diagnosis Date Noted   Chronic combined systolic and diastolic congestive heart failure (HCC) 08/25/2020   Acute CVA (cerebrovascular accident) (HCC) 08/24/2020   Borderline prolonged QT interval 08/24/2020   Type 2 diabetes mellitus with complication, without long-term current use of insulin  (HCC) 03/16/2020   Anasarca    Diabetes mellitus (HCC)    Hypokalemia    Vitamin D  deficiency    Hypothyroidism    Physical deconditioning    Acute on chronic systolic CHF (congestive heart failure) (HCC) 03/09/2020   Acute on chronic systolic HF (heart failure) (HCC) 03/09/2020   Hyperglycemia 12/22/2018   Hypertensive crisis 04/04/2018   Acute CHF (congestive heart failure) (HCC) 04/04/2018   Mild renal insufficiency 04/04/2018   Elevated troponin 04/04/2018   Bronchitis    Obesity    Seborrheic dermatitis of scalp 05/23/2016   Essential hypertension 12/02/2015   Depression 12/02/2015   Primary insomnia 12/02/2015   Morbid obesity with body mass index of 50.0-59.9 in adult [facility]) 12/02/2015   Attention deficit hyperactivity disorder (ADHD), predominantly inattentive type 12/02/2015    Orientation RESPIRATION BLADDER Height & Weight        Normal External catheter Weight: 183 lb 3.2 oz (83.1 kg) Height:  5'  5 (165.1 cm)  BEHAVIORAL SYMPTOMS/MOOD NEUROLOGICAL BOWEL NUTRITION STATUS      Incontinent Diet (See d/c summary)  AMBULATORY STATUS COMMUNICATION OF NEEDS Skin     Verbally Normal                       Personal Care Assistance Level of Assistance  Total care       Total Care Assistance: Maximum assistance   Functional Limitations Info  Sight, Hearing, Speech Sight Info: Adequate Hearing Info: Adequate Speech Info: Impaired    SPECIAL CARE FACTORS FREQUENCY  PT (By licensed PT), OT (By licensed OT)     PT Frequency: 5x weekly OT Frequency: 5x weekly            Contractures      Additional Factors Info  Code Status, Allergies, Psychotropic, Insulin  Sliding Scale Code Status Info: Full Allergies Info: Hydroxyzine  Psychotropic Info: Lexapro , Wellbutrin          Current Medications (01/15/2024):  This is the current hospital active medication list Current Facility-Administered Medications  Medication Dose Route Frequency Provider Last Rate Last Admin    stroke: early stages of recovery book   Does not apply Once Emokpae, Ejiroghene E, MD       acetaminophen  (TYLENOL ) tablet 650 mg  650 mg Oral Q4H PRN Emokpae, Ejiroghene E, MD       Or   acetaminophen  (TYLENOL ) 160 MG/5ML solution 650 mg  650 mg Per Tube Q4H PRN Emokpae, Ejiroghene E, MD       Or  acetaminophen  (TYLENOL ) suppository 650 mg  650 mg Rectal Q4H PRN Emokpae, Ejiroghene E, MD       aspirin  suppository 150 mg  150 mg Rectal Once Shahmehdi, Seyed A, MD       atorvastatin  (LIPITOR) tablet 40 mg  40 mg Oral Daily Shahmehdi, Seyed A, MD       Chlorhexidine  Gluconate Cloth 2 % PADS 6 each  6 each Topical Q0600 Emokpae, Ejiroghene E, MD   6 each at 01/15/24 0601   clopidogrel  (PLAVIX ) tablet 75 mg  75 mg Oral Daily Shahmehdi, Seyed A, MD       enoxaparin  (LOVENOX ) injection 40 mg  40 mg Subcutaneous Q24H Emokpae, Ejiroghene E, MD   40 mg at 01/14/24 2007   insulin  aspart (novoLOG ) injection 0-9 Units   0-9 Units Subcutaneous Q6H Emokpae, Ejiroghene E, MD   2 Units at 01/15/24 0747   labetalol  (NORMODYNE ) injection 10 mg  10 mg Intravenous Q2H PRN Emokpae, Ejiroghene E, MD   10 mg at 01/15/24 0741   lactated ringers  1,000 mL with potassium chloride  20 mEq infusion   Intravenous Continuous Willette Jest A, MD 50 mL/hr at 01/15/24 0934 New Bag at 01/15/24 0934   [START ON 01/16/2024] levothyroxine  (SYNTHROID ) tablet 100 mcg  100 mcg Oral Q0600 Shahmehdi, Seyed A, MD       perflutren  lipid microspheres (DEFINITY ) IV suspension  1-10 mL Intravenous PRN Shahmehdi, Seyed A, MD   2 mL at 01/15/24 1001   senna-docusate (Senokot-S) tablet 1 tablet  1 tablet Oral QHS PRN Emokpae, Ejiroghene E, MD         Discharge Medications: Please see discharge summary for a list of discharge medications.  Relevant Imaging Results:  Relevant Lab Results:   Additional Information SSN: [id number]  [place] Saddie Kim, LCSW

## 2024-01-15 NOTE — Consult Note (Signed)
 Consultation Note Date: 01/15/2024   [name]  DOB: [birth year]  MRN: [id number]  Age / Sex: 58 y.o., female  PCP: Corrington, Kip A, MD Referring Physician: Willette Adriana LABOR, MD  Reason for Consultation: Establishing goals of care  HPI/Patient Profile: 58 y.o. female  with past medical history of CHF, ADD, bronchitis, depression, type 2 diabetes, hypertension, obesity, prior CVA, chronic combined systolic and diastolic heart failure, vitamin D  deficiency, and hypothyroidism due to Hashimoto's thyroiditis admitted on 01/14/2024 with right-sided weakness and aphasia.   Workup revealed Acute infarcts in the right corona radiata, caudate, lentiform nucleus, and external capsule.  Also noted to have extensive microvascular ischemic changes and changes consistent with possible component of hypertensive encephalopathy.  Per prior notes, patient has a history of prior strokes.  Deficits include right-sided weakness, aphasia, and dysphagia.  PMT has been consulted to assist with goals of care conversation.  Prior records reviewed.  I see she is followed by [facility] last visit was 12/02/2023 at that time, she was seen for followed for type 2 diabetes and uncontrolled hypertension as well as a history of CVA.  There was a concern that she was not taking her medicine due to side effects.  They had recommended she go to the hospital for elevated blood pressure readings but she refused.  In review of prior family medicine notes, it appears that she has a longstanding history of variable med compliance.  It appears that patient was also referred to social work due to some financial and personal care need concerns.  Per this note, patient noted to be total care/bedbound.  Today, labs independently reviewed I see she has some mild hypokalemia at 3.4 on admission and 3.2 on recheck.  Blood sugar level elevated in the setting of type 2 diabetes.  Renal  function appears stable.  Would benefit from continued monitoring.  Cholesterol panel reveals elevated LDL, trach, and total cholesterol.  HDL normal.  CBC reveals normal white blood cell count.  Elevated hemoglobin in the 19 range.  TSH minimally elevated.  MRI brain obtained 01/14/2024 revealed acute stroke.  Vital signs reviewed.  Blood pressure significantly elevated.  O2 sats appear stable on room air.  Heart rate stable.  Mild intermittent tachypnea noted.  Afebrile.  Medication administration record reviewed.  Patient received 1 mg of IV Ativan  on 24-hour look back.  Otherwise, no symptom meds administered.  Independent history obtained from family and nursing staff as patient has severe aphasia and is therefore unable to contribute to HPI/ROS.  Per nursing, patient remains quite ill.  She has been moving her left arm but is completely flaccid on the right.  She will grab someone's hand and hold it but has not been moaning or crying out.  Clinical Assessment and Goals of Care:  I have reviewed medical records including EPIC notes, labs and imaging (independently reviewed), MAR, vital signs, assessed the patient and then met with patient and family to discuss diagnosis prognosis, GOC, EOL wishes, disposition and options. Collaborated directly with attending physician, bedside nursing staff, and TOC.   I introduced Palliative Medicine as specialized medical care for people living with serious illness. It focuses on providing relief from the symptoms and stress of a serious illness. The goal is to improve quality of life for both the patient and the family.  We discussed a brief life review of the patient and then focused on their current illness.   I attempted to elicit values and goals  of care important to the patient.    Medical History Review and Family/Patient Understanding:   [name] and father at bedside.  With permission, engaged in a detailed conversation with her parents about  the seriousness of her current illness, in light of severe nature of this new stroke and complex medical history, and explained the potential implications this may have for her recovery and long-term well-being.  We discussed the risk for permanent deficits as a result of the stroke as well as future risk of stroke.  Social History:  Patient was a Midwife for 23 years.  She lives with her mother and father who care for her.  She has 1 daughter who lives in [place] and 1 son who lives locally.  Functional and Nutritional State:  At baseline, patient is bedbound due to right sided paralysis from a stroke about 2 years ago.  She is fully incontinent.  She was able to speak and swallow up until about 3 weeks ago when family noticed she started having more trouble swallowing and started needing to be fed.  Her appetite was good up until about 3 weeks ago and prior to this hospitalization was poor.  Her parents, oldest daughter, and a hired caregiver help provide care.  Palliative Symptoms:  Pain  Advance Directives/Goals of Care/Anticipatory care planning Discussion:  The mother and father consented to a voluntary Advance Care Planning Conversation in person. Individuals present for the conversation: Morene and Laverne Tuggle, this nurse practitioner, and patient.  A detailed discussion regarding GOC, advanced directives, and anticipatory care planning was had.  Provided education to family about the seriousness of patient's current illness (new acute stroke with significant deficits, aphasia, dysphagia, etc.), in light of her complex medical history and baseline poor functional status, and explained the potential implications this may have on her prognosis.  Ask about any prior advance care planning.  Patient has apparently never had a living will, advanced directive, and has no healthcare power of attorney assigned.  Her parents are her caregivers and have been assisting with medical  decision making.  Goals of care elicited, at this point, family is hopeful to optimize her comfort and quality of life.  Given goals of care comfort/quality of life and potential long-term/irreversible sequela of stroke, engaged in a discussion regarding advanced directives.  Discussed the benefits, limitations, and potential burdens of CPR, intubation, and insertion of feeding tube for artificial feeding and hydration. We discussed how the potential consequences of surviving CPR could significantly affect a person's quality of life--particularly if their definition of quality of life involves being comfortable and avoiding suffering. Following a goals-of-care conversation, the parents opted for DNR/DNI status and this has been documented in the medical record.  Family will consider feeding tube but would like for patient to be evaluated by speech to determine potential for improvement in swallowing abilities before making any decisions.  We did discuss that if they elect not to pursue feeding tube and continue to desire comfort focused approach, avoid future hospitalizations, patient would be appropriate for hospice. Reviewed hospice philosophy of care, including focus on comfort, quality of life, and support for patients and families facing terminal illness. Discussed eligibility criteria and potential benefits of enrollment. Discussed multiple hospice care settings, including home-based hospice, long-term care facilities with hospice services, and inpatient hospice units. Reviewed the nature of care delivery in each setting, admission criteria, and associated financial considerations.  Patient's family states that they would want to take her home. Agreed  for the time being to continue to treat the treatable.  Allow time for outcomes and further workup/evaluation with therapy.  Discussed the importance of continued conversation with family and the medical providers regarding overall plan of care and treatment  options, ensuring decisions are within the context of the patient's values and GOCs.   Questions and concerns were addressed.  The family was encouraged to call with questions or concerns.  PMT will continue to support holistically.  Outcome of the conversations and/or documents completed: DNR, golden rod form completed and scanned  I spent 30 minutes providing separately identifiable ACP services with the patient and/or surrogate decision maker in a voluntary, in-person conversation discussing the patient's wishes and goals as detailed in the above note.  Primary Decision maker and health care surrogate:  NEXT OF KIN (patient's mother and father/primary caregivers)  Code Status:  DNR/DNI    SUMMARY OF RECOMMENDATIONS    Transition from full code to DNR/DNI Continue to treat the treatable, time for outcomes Continue to discussed the utility of feeding tube for artificial nutrition and hydration Discussed initiating as needed Dilaudid  with attending physician for signs or symptoms of pain. Palliative medicine team will continue to follow for ongoing goals of care discussion, symptom management, and coordination of care.   Code Status/Advance Care Planning: DNR   Symptom Management:   Spoke with attending physician in person and recommended hydromorphone  0.5 mg q 2 hrs PRN pain given current n.p.o. status Continue Tylenol  PR as needed mild pain Continue senna as needed  Palliative Prophylaxis:  Aspiration  Psycho-social/Spiritual:  Desire for further Chaplaincy support:no  Prognosis:  Unable to determine  Discharge Planning: To Be Determined      Primary Diagnoses: Present on Admission:  Acute CVA (cerebrovascular accident) (HCC)  Chronic combined systolic and diastolic congestive heart failure (HCC)  Essential hypertension  Hypothyroidism    Physical Exam Constitutional:      Comments: Appears chronically ill  HENT:     Mouth/Throat:     Mouth: Mucous  membranes are dry.  Pulmonary:     Effort: Pulmonary effort is normal. No respiratory distress.  Neurological:     Comments: Nonverbal, Able to grip hand with left hand but no purposeful movement to the right side     Vital Signs: BP (!) 182/123   Pulse 82   Temp 97.6 F (36.4 C) (Axillary)   Resp 16   Ht 5' 5 (1.651 m)   Wt 83.1 kg   SpO2 96%   BMI 30.49 kg/m  Pain Scale: PAINAD       SpO2: SpO2: 96 % O2 Device:SpO2: 96 % O2 Flow Rate: .    Palliative Assessment/Data: Current: 10 to 20%    Billing based on MDM: High  Problems Addressed: One acute or chronic illness or injury that poses a threat to life or bodily function  Amount and/or Complexity of Data: Category 1:Review of prior external note(s) from each unique source and Assessment requiring an independent historian(s) and Category 3:Discussion of management or test interpretation with external physician/other qualified health care professional/appropriate source (not separately reported)  Risks: Parenteral controlled substances   Laymon CHRISTELLA Pinal, NP  Palliative Medicine Team Team phone # [phone]  Thank you for allowing the Palliative Medicine Team to assist in the care of this patient. Please utilize secure chat with additional questions, if there is no response within 30 minutes please call the above phone number.  Palliative Medicine Team providers are available by phone from  7am to 7pm daily and can be reached through the team cell phone.  Should this patient require assistance outside of these hours, please call the patient's attending physician.

## 2024-01-15 NOTE — Evaluation (Signed)
 Physical Therapy Evaluation Patient Details Name: Connie Shepard MRN: [id number] DOB: 09-26-1965 Today's Date: 01/15/2024  History of Present Illness  Connie Shepard is a 58 y.o. female with medical history significant for stroke with baseline right hemiparesis, diabetes mellitus, CHF, hypertension, hypothyroidism.  Patient was brought to the ED via EMS with reports of unresponsiveness, right gaze, and aphasia.  Family reported that patient was last known well at about 8 PM last night when she went to bed.  This morning when she woke up she is not feeling well and was not talking much at all, it was about 12:30 PM after she woke up from a nap and I did notice that she was not talking at all.  EMS noted that she was gazing to the right on the following directions.  On my evaluation, patient is awake, but gazing to the right and unable to answer questions.   Clinical Impression  Patient demonstrates slow labored movement for sitting up at bedside requiring Max/total assist, once seated had frequent posterior leaning and limited to partial standing with knees blocked at bedside.  Patient unable to transfer to chair due to weakness. Patient put back to bed with Max/total assist for repositioning. Patient will benefit from continued skilled physical therapy in hospital and recommended venue below to increase strength, balance, endurance for safe ADLs and gait.          If plan is discharge home, recommend the following: A lot of help with bathing/dressing/bathroom;Help with stairs or ramp for entrance;A lot of help with walking and/or transfers;Assistance with cooking/housework;Assist for transportation   Can travel by private vehicle   No    Equipment Recommendations None recommended by PT  Recommendations for Other Services       Functional Status Assessment Patient has had a recent decline in their functional status and demonstrates the ability to make significant improvements in function in a  reasonable and predictable amount of time.     Precautions / Restrictions Precautions Precautions: Fall Recall of Precautions/Restrictions: Impaired Restrictions Weight Bearing Restrictions Per Provider Order: No      Mobility  Bed Mobility Overal bed mobility: Needs Assistance Bed Mobility: Supine to Sit, Sit to Supine     Supine to sit: Max assist, Total assist, HOB elevated Sit to supine: Max assist, Total assist   General bed mobility comments: slow labored movement with limited use of BUE due to weakness    Transfers Overall transfer level: Needs assistance Equipment used: 1 person hand held assist Transfers: Sit to/from Stand Sit to Stand: Max assist, Total assist           General transfer comment: limited to partial standing with weightbearing mostly  on RLE, unable to fully extend legs due to weakness    Ambulation/Gait                  Stairs            Wheelchair Mobility     Tilt Bed    Modified Rankin (Stroke Patients Only)       Balance Overall balance assessment: Needs assistance Sitting-balance support: Feet supported, No upper extremity supported Sitting balance-Leahy Scale: Poor Sitting balance - Comments: seated at EOB   Standing balance support: During functional activity, No upper extremity supported Standing balance-Leahy Scale: Poor Standing balance comment: poor/zero hand held assist with right knee blocked  Pertinent Vitals/Pain Pain Assessment Pain Assessment: No/denies pain    Home Living Family/patient expects to be discharged to:: Private residence Living Arrangements: Parent Available Help at Discharge: Available 24 hours/day;Family;Friend(s);Personal care attendant Type of Home: House Home Access: Stairs to enter Entrance Stairs-Rails: None Entrance Stairs-Number of Steps: 3 Alternate Level Stairs-Number of Steps: 12 Home Layout: Multi-level;Able to live on main  level with bedroom/bathroom Home Equipment: Rolling Walker (2 wheels);Cane - single point;BSC/3in1;Shower seat;Grab bars - tub/shower;Wheelchair - manual;Other (comment) Additional Comments: Family reports an individual comes 3 days a week to help the pt.    Prior Function Prior Level of Function : Needs assist       Physical Assist : ADLs (physical);Mobility (physical) Mobility (physical): Stairs;Transfers;Bed mobility;Gait ADLs (physical): Feeding;Dressing;Toileting;IADLs;Bathing;Grooming Mobility Comments: Friend comes to assist patient with stand pivot  transfer to w/c when pt has appointments, was able to stand take a few steps when working with HHPT ADLs Comments: Assist for all ADL's.     Extremity/Trunk Assessment   Upper Extremity Assessment Upper Extremity Assessment: Defer to OT evaluation    Lower Extremity Assessment Lower Extremity Assessment: Generalized weakness;RLE deficits/detail;LLE deficits/detail RLE Deficits / Details: grossly -3/5 RLE Sensation: decreased light touch RLE Coordination: decreased fine motor;decreased gross motor LLE Deficits / Details: grossly 2/5 LLE Sensation: decreased light touch LLE Coordination: decreased fine motor;decreased gross motor    Cervical / Trunk Assessment Cervical / Trunk Assessment: Kyphotic  Communication   Communication Communication: Impaired Factors Affecting Communication: Difficulty expressing self    Cognition Arousal: Alert Behavior During Therapy: Flat affect                             Following commands: Impaired Following commands impaired: Follows one step commands inconsistently     Cueing Cueing Techniques: Gestural cues, Tactile cues, Verbal cues     General Comments      Exercises     Assessment/Plan    PT Assessment Patient needs continued PT services  PT Problem List Decreased strength;Decreased activity tolerance;Decreased balance;Decreased mobility;Decreased  coordination;Impaired sensation       PT Treatment Interventions DME instruction;Gait training;Stair training;Functional mobility training;Therapeutic activities;Therapeutic exercise;Balance training;Patient/family education    PT Goals (Current goals can be found in the Care Plan section)  Acute Rehab PT Goals Patient Stated Goal: not stated. Family wants patient to return home after rehab PT Goal Formulation: With patient/family Time For Goal Achievement: 01/29/24 Potential to Achieve Goals: Fair    Frequency Min 3X/week     Co-evaluation PT/OT/SLP Co-Evaluation/Treatment: Yes Reason for Co-Treatment: To address functional/ADL transfers;Complexity of the patient's impairments (multi-system involvement) PT goals addressed during session: Mobility/safety with mobility;Balance         AM-PAC PT 6 Clicks Mobility  Outcome Measure Help needed turning from your back to your side while in a flat bed without using bedrails?: A Lot Help needed moving from lying on your back to sitting on the side of a flat bed without using bedrails?: A Lot Help needed moving to and from a bed to a chair (including a wheelchair)?: A Lot Help needed standing up from a chair using your arms (e.g., wheelchair or bedside chair)?: Total Help needed to walk in hospital room?: Total Help needed climbing 3-5 steps with a railing? : Total 6 Click Score: 9    End of Session   Activity Tolerance: Patient tolerated treatment well;Patient limited by fatigue Patient left: in bed;with call bell/phone within reach;with family/visitor present  Nurse Communication: Mobility status PT Visit Diagnosis: Unsteadiness on feet (R26.81);Other abnormalities of gait and mobility (R26.89);Muscle weakness (generalized) (M62.81)    Time: 9162-9147 PT Time Calculation (min) (ACUTE ONLY): 15 min   Charges:   PT Evaluation $PT Eval Low Complexity: 1 Low PT Treatments $Therapeutic Activity: 8-22 mins PT General  Charges $$ ACUTE PT VISIT: 1 Visit         2:43 PM, 01/15/24 Lynwood Music, MPT Physical Therapist with [facility] [phone] office [phone] mobile phone

## 2024-01-15 NOTE — Progress Notes (Signed)
 Patient has been flaccid on her right side the whole time unless, when this RN was cleaning the patient and turning to her left side, the patient had a unpurposeful lifting of her right hand twice, but later when this RN tried to coach to move her right side, it was flaccid again, so the NIH scorring had been done with her Right upper arm being paralyzed . Will endorse to the dayshift RN for closer monitoring.

## 2024-01-15 NOTE — Evaluation (Signed)
 Occupational Therapy Evaluation Patient Details Name: Connie Shepard MRN: [id number] DOB: Aug 02, 1965 Today's Date: 01/15/2024   History of Present Illness   Connie Shepard is a 58 y.o. female with medical history significant for stroke with baseline right hemiparesis, diabetes mellitus, CHF, hypertension, hypothyroidism.  Patient was brought to the ED via EMS with reports of unresponsiveness, right gaze, and aphasia.  Family reported that patient was last known well at about 8 PM last night when she went to bed.  This morning when she woke up she is not feeling well and was not talking much at all, it was about 12:30 PM after she woke up from a nap and I did notice that she was not talking at all.  EMS noted that she was gazing to the right on the following directions.  On my evaluation, patient is awake, but gazing to the right and unable to answer questions. (per MD)     Clinical Impressions Pt unable to communicate today. Parents present and able to provide prior functioning history. Pt was requiring assist for all ADL's at home and was mostly bed bound. Today pt's R UE had moderate tone with very minimal movement noted. Difficult to assess L UE due to pt's cognitive and communication deficits. Much more functional use of L UE and seeming full P/ROM. Able to grasp railing and reach across midline. Poor visual tracking and possible L visual field deficit. More testing needing in this area. Pt required max/total assist for bed mobility and slight, partial stand from EOB. Pt left in bed with family and RN present. Pt will benefit from continued OT in the hospital to increase strength, balance, and endurance for safe ADL's.        If plan is discharge home, recommend the following:   Two people to help with walking and/or transfers;Two people to help with bathing/dressing/bathroom;A lot of help with bathing/dressing/bathroom;Assistance with cooking/housework;Direct supervision/assist for medications  management;Assistance with feeding;Assist for transportation;Help with stairs or ramp for entrance     Functional Status Assessment   Patient has had a recent decline in their functional status and/or demonstrates limited ability to make significant improvements in function in a reasonable and predictable amount of time     Equipment Recommendations   None recommended by OT             Precautions/Restrictions   Precautions Precautions: Fall Recall of Precautions/Restrictions: Impaired Restrictions Weight Bearing Restrictions Per Provider Order: No     Mobility Bed Mobility Overal bed mobility: Needs Assistance Bed Mobility: Supine to Sit, Sit to Supine     Supine to sit: Max assist, Total assist, HOB elevated Sit to supine: Max assist, Total assist   General bed mobility comments: Much assist needed at this time.    Transfers                   General transfer comment: Only partial stand with max/total assist from the EOB.      Balance Overall balance assessment: Needs assistance Sitting-balance support: Single extremity supported, Feet supported Sitting balance-Leahy Scale: Poor Sitting balance - Comments: seated at EOB Postural control: Right lateral lean, Other (comment) (anterior lean)                                 ADL either performed or assessed with clinical judgement   ADL Overall ADL's : Needs assistance/impaired Eating/Feeding: Total assistance;Maximal assistance   Grooming: Maximal  assistance;Total assistance;Bed level   Upper Body Bathing: Maximal assistance;Total assistance;Bed level   Lower Body Bathing: Total assistance;Bed level   Upper Body Dressing : Maximal assistance;Total assistance;Bed level   Lower Body Dressing: Total assistance;Bed level   Toilet Transfer: Total assistance;Maximal assistance Toilet Transfer Details (indicate cue type and reason): Partially simualted via slightly lifting body off  the bed with PT max to total assist. Toileting- Clothing Manipulation and Hygiene: Total assistance;Bed level               Vision Patient Visual Report:  (Unsure of baseline.) Vision Assessment?: Vision impaired- to be further tested in functional context Additional Comments: Pt only briefly tracking a horizontal object. No other tracking in any direction noted. Possible L visual field deficit given R gaze preference at times.     Perception Perception: Not tested       Praxis Praxis: Not tested       Pertinent Vitals/Pain       Extremity/Trunk Assessment Upper Extremity Assessment Upper Extremity Assessment: RUE deficits/detail;LUE deficits/detail;Right hand dominant RUE Deficits / Details: Hemiparesis from old stroke, but notes indicate pt had regained some function. R UE flaccid with slight movement at elbow which seem unintentional and likely more related to tone. Moderate tone in R UE. No other active movement noted. RUE Coordination: decreased fine motor;decreased gross motor LUE Deficits / Details: Difficult to fully assess given pt's cognition, but with assist pt was able to reach over head and grasp the railing to assist with bed mobility. P/ROM seems to be intact.   Lower Extremity Assessment Lower Extremity Assessment: Defer to PT evaluation       Communication Communication Communication: Impaired Factors Affecting Communication: Difficulty expressing self   Cognition Arousal: Alert Behavior During Therapy: Flat affect Cognition: Cognition impaired             OT - Cognition Comments: Pt unable to verbally communicate today. Not following commands without support.                 Following commands: Impaired Following commands impaired: Follows one step commands inconsistently     Cueing  General Comments   Cueing Techniques: Gestural cues;Tactile cues;Verbal cues                 Home Living Family/patient expects to be  discharged to:: Private residence Living Arrangements: Parent Available Help at Discharge: Available 24 hours/day;Family;Friend(s);Personal care attendant Type of Home: House Home Access: Stairs to enter [employer] of Steps: 3 Entrance Stairs-Rails: None Home Layout: Multi-level;Able to live on main level with bedroom/bathroom     Bathroom Shower/Tub: Tub/shower unit   Bathroom Toilet: Handicapped height Bathroom Accessibility: Yes How Accessible: Accessible via wheelchair;Accessible via walker Home Equipment: Rolling Walker (2 wheels);Cane - single point;BSC/3in1;Shower seat;Grab bars - tub/shower;Wheelchair - manual;Other (comment) (hoyer lift)   Additional Comments: Family reports an individual comes 3 days a week to help the pt.      Prior Functioning/Environment Prior Level of Function : Needs assist       Physical Assist : ADLs (physical);Mobility (physical) Mobility (physical): Stairs;Transfers;Bed mobility;Gait ADLs (physical): Feeding;Dressing;Toileting;IADLs;Bathing;Grooming Mobility Comments: Pt mostly bedbound. Friend comes to assist patient with transfer to w/c when pt has appointments. ADLs Comments: Assist for all ADL's.    OT Problem List: Decreased strength;Decreased range of motion;Decreased activity tolerance;Impaired balance (sitting and/or standing);Impaired vision/perception;Decreased cognition;Decreased coordination;Decreased safety awareness;Impaired tone;Impaired UE functional use;Pain   OT Treatment/Interventions: Self-care/ADL training;Therapeutic exercise;DME and/or AE instruction;Neuromuscular education;Therapeutic activities;Cognitive remediation/compensation;Visual/perceptual remediation/compensation;Patient/family  education;Balance training      OT Goals(Current goals can be found in the care plan section)   Acute Rehab OT Goals Patient Stated Goal: Go to rehab. OT Goal Formulation: With family Time For Goal Achievement:  01/29/24 Potential to Achieve Goals: Fair   OT Frequency:  Min 3X/week    Co-evaluation PT/OT/SLP Co-Evaluation/Treatment: Yes Reason for Co-Treatment: To address functional/ADL transfers;Complexity of the patient's impairments (multi-system involvement)   OT goals addressed during session: ADL's and self-care                       End of Session Nurse Communication: Mobility status  Activity Tolerance: Patient tolerated treatment well Patient left: in bed;with family/visitor present;Other (comment) (RN working with pt)  OT Visit Diagnosis: Unsteadiness on feet (R26.81);Other abnormalities of gait and mobility (R26.89);Muscle weakness (generalized) (M62.81);Other symptoms and signs involving the nervous system (R29.898);Other symptoms and signs involving cognitive function;Cognitive communication deficit (R41.841);Hemiplegia and hemiparesis Symptoms and signs involving cognitive functions: Cerebral infarction Hemiplegia - Right/Left: Right Hemiplegia - dominant/non-dominant: Dominant Hemiplegia - caused by: Cerebral infarction                Time: 9163-9146 OT Time Calculation (min): 17 min Charges:  OT General Charges $OT Visit: 1 Visit OT Evaluation $OT Eval Low Complexity: 1 Low  Reba Hulett OT, MOT   [employer] 01/15/2024, 11:02 AM

## 2024-01-16 ENCOUNTER — Inpatient Hospital Stay (HOSPITAL_COMMUNITY)

## 2024-01-16 DIAGNOSIS — I639 Cerebral infarction, unspecified: Secondary | ICD-10-CM | POA: Diagnosis not present

## 2024-01-16 DIAGNOSIS — Z515 Encounter for palliative care: Secondary | ICD-10-CM | POA: Diagnosis not present

## 2024-01-16 DIAGNOSIS — Z7189 Other specified counseling: Secondary | ICD-10-CM | POA: Diagnosis not present

## 2024-01-16 DIAGNOSIS — R1310 Dysphagia, unspecified: Secondary | ICD-10-CM

## 2024-01-16 DIAGNOSIS — Z79899 Other long term (current) drug therapy: Secondary | ICD-10-CM | POA: Diagnosis not present

## 2024-01-16 LAB — BASIC METABOLIC PANEL WITH GFR
Anion gap: 16 — ABNORMAL HIGH (ref 5–15)
BUN: 13 mg/dL (ref 6–20)
CO2: 25 mmol/L (ref 22–32)
Calcium: 10.1 mg/dL (ref 8.9–10.3)
Chloride: 103 mmol/L (ref 98–111)
Creatinine, Ser: 1.24 mg/dL — ABNORMAL HIGH (ref 0.44–1.00)
GFR, Estimated: 51 mL/min — ABNORMAL LOW (ref >60)
Glucose, Bld: 133 mg/dL — ABNORMAL HIGH (ref 70–99)
Potassium: 3.5 mmol/L (ref 3.5–5.1)
Sodium: 143 mmol/L (ref 135–145)

## 2024-01-16 LAB — GLUCOSE, CAPILLARY
Glucose-Capillary: 113 mg/dL — ABNORMAL HIGH (ref 70–99)
Glucose-Capillary: 122 mg/dL — ABNORMAL HIGH (ref 70–99)
Glucose-Capillary: 122 mg/dL — ABNORMAL HIGH (ref 70–99)
Glucose-Capillary: 124 mg/dL — ABNORMAL HIGH (ref 70–99)
Glucose-Capillary: 153 mg/dL — ABNORMAL HIGH (ref 70–99)

## 2024-01-16 MED ORDER — HYDRALAZINE HCL 20 MG/ML IJ SOLN
10.0000 mg | Freq: Once | INTRAMUSCULAR | Status: AC
  Administered 2024-01-16: 10 mg via INTRAVENOUS
  Filled 2024-01-16: qty 1

## 2024-01-16 MED ORDER — POTASSIUM CHLORIDE 2 MEQ/ML IV SOLN
INTRAVENOUS | Status: DC
  Filled 2024-01-16 (×2): qty 1000

## 2024-01-16 MED ORDER — ASPIRIN 300 MG RE SUPP
150.0000 mg | Freq: Once | RECTAL | Status: AC
  Administered 2024-01-16: 150 mg via RECTAL
  Filled 2024-01-16: qty 1

## 2024-01-16 NOTE — TOC Progression Note (Signed)
 Transition of Care [facility]) - Progression Note    Patient Details  Name: Connie Shepard MRN: [id number] Date of Birth: [birth year]  [facility] CM/SW Contact  Mcarthur Saddie Kim, [place] Phone Number: 01/16/2024, 11:13 AM  Clinical Narrative: TOC presented bed offers to pt's parents at bedside. They request to think about it today and will decide in AM.  Will update auth when bed chosen.      Expected Discharge Plan: Skilled Nursing Facility Barriers to Discharge: Continued Medical Work up               Expected Discharge Plan and Services In-house Referral: Clinical Social Work   Post Acute Care Choice: Skilled Nursing Facility Living arrangements for the past 2 months: Single Family Home                                       Social Drivers of Health (SDOH) Interventions SDOH Screenings   Food Insecurity: No Food Insecurity (01/14/2024)  Housing: Low Risk  (01/14/2024)  Transportation Needs: No Transportation Needs (01/14/2024)  Utilities: Not At Risk (01/14/2024)  Depression (PHQ2-9): Medium Risk (04/30/2018)  Financial Resource Strain: Low Risk  (05/07/2023)   Received from [facility]  Physical Activity: Unknown (05/07/2023)   Received from [facility]  Social Connections: Somewhat Isolated (05/07/2023)   Received from [facility]  Stress: No Stress Concern Present (05/07/2023)   Received from [facility]  Tobacco Use: Low Risk  (01/14/2024)  Health Literacy: Medium Risk (01/10/2021)   Received from [facility]    Readmission Risk Interventions     No data to display

## 2024-01-16 NOTE — Progress Notes (Signed)
 Speech Language Pathology Treatment: Dysphagia  Patient Details Name: Connie Shepard MRN: [id number] DOB: 15-Sep-1965 Today's Date: 01/16/2024 Time: 8594-8571 SLP Time Calculation (min) (ACUTE ONLY): 23 min  Assessment / Plan / Recommendation Clinical Impression  Pt seen for ongoing dysphagia intervention and she presents with improvement since yesterday. She is tracking with both eyes with right with asymmetry and moving her left hand to hold my hand and point etc. Some groaning and vocalizations throughout the visit. Upon SLP verbal request and some modeling, Pt was able to point to her nose, thumbs up/down, give high five, show two fingers, and cough. Oral care completed and Pt accepted single ice chips with good oral bolus manipulation, suspected delay in swallow trigger with multiple audible swallows elicited (suspect reduced pressure due to open oral cavity and/or impaired velar movement) and some delayed throat clearing and coughing. Pt is not yet ready for PO, but can continue with oral care and offer single ice chips when she is alert and upright and presented by RN. Pt encouraged to try NG tube for short term nutrition, however significant improvement noted today. SLP will check back tomorrow and will defer SLE until sufficiently more alert. She does appear to understand simple conversation and commands. Above to RN and MD.    HPI HPI: Connie Shepard is a 58 y.o. female with medical history significant for stroke with baseline right hemiparesis, diabetes mellitus, CHF, hypertension, hypothyroidism.  Patient was brought to the ED via EMS with reports of unresponsiveness, right gaze, and aphasia.  Family reported that patient was last known well at about 8 PM last night when she went to bed.  This morning when she woke up she is not feeling well and was not talking much at all, it was about 12:30 PM after she woke up from a nap and I did notice that she was not talking at all.  EMS noted that she was  gazing to the right on the following directions.  BSE requested      SLP Plan  Continue with current plan of care          Recommendations  Diet recommendations: NPO Medication Administration: Via alternative means                  Oral care prior to ice chip/H20;Staff/trained caregiver to provide oral care   Frequent or constant Supervision/Assistance Dysphagia, oropharyngeal phase (R13.12)     Continue with current plan of care   Thank you,  Lamar Candy, CCC-SLP [phone]   Mahaley Schwering  01/16/2024, 2:34 PM

## 2024-01-16 NOTE — Plan of Care (Signed)

## 2024-01-16 NOTE — Progress Notes (Signed)
 Occupational Therapy Treatment Patient Details Name: Connie Shepard MRN: [id number] DOB: Sep 03, 1965 Today's Date: 01/16/2024   History of present illness Connie Shepard is a 58 y.o. female with medical history significant for stroke with baseline right hemiparesis, diabetes mellitus, CHF, hypertension, hypothyroidism.  Patient was brought to the ED via EMS with reports of unresponsiveness, right gaze, and aphasia.  Family reported that patient was last known well at about 8 PM last night when she went to bed.  This morning when she woke up she is not feeling well and was not talking much at all, it was about 12:30 PM after she woke up from a nap and I did notice that she was not talking at all.  EMS noted that she was gazing to the right on the following directions.  On my evaluation, patient is awake, but gazing to the right and unable to answer questions.   OT comments  Pt demonstrated improved seated balance today with ability to maintain seated position at EOB for over 20 seconds without additional support. Pt continues to require max to total assist for bed mobility, but did grasp L rail with L UE during rolling in bed. Max to total assist still needed for sit to stand with L UE support on RW. Pt left in the bed with call bell within reach and family present. Pt will benefit from continued OT in the hospital to increase strength, balance, and endurance for safe ADL's.       If plan is discharge home, recommend the following:  Two people to help with walking and/or transfers;Two people to help with bathing/dressing/bathroom;A lot of help with bathing/dressing/bathroom;Assistance with cooking/housework;Direct supervision/assist for medications management;Assistance with feeding;Assist for transportation;Help with stairs or ramp for entrance   Equipment Recommendations  None recommended by OT    Recommendations for Other Services      Precautions / Restrictions Precautions Precautions:  Fall Recall of Precautions/Restrictions: Impaired Restrictions Weight Bearing Restrictions Per Provider Order: No       Mobility Bed Mobility Overal bed mobility: Needs Assistance Bed Mobility: Rolling, Supine to Sit, Sit to Supine Rolling: Max assist, Used rails   Supine to sit: Max assist, Total assist Sit to supine: Max assist, Total assist   General bed mobility comments: Pt able to grasp railing to L side using L UE today durin grolling. Much assist for sitting at EOB and returning to supine.    Transfers Overall transfer level: Needs assistance Equipment used: 1 person hand held assist Transfers: Sit to/from Stand Sit to Stand: Max assist, Total assist           General transfer comment: Sit to stand from EOB using RW with L UE. OT supporting R UE while PT gave most assist for sit to stand.     Balance Overall balance assessment: Needs assistance Sitting-balance support: Feet supported, Single extremity supported Sitting balance-Leahy Scale: Poor Sitting balance - Comments: Poor but instances of fair seated at the EOB. Postural control: Posterior lean, Other (comment) (anterior lean) Standing balance support: Single extremity supported, During functional activity Standing balance-Leahy Scale: Poor Standing balance comment: Poor to zero using RW in L UE.                           ADL either performed or assessed with clinical judgement   ADL Overall ADL's : Needs assistance/impaired                 Upper  Body Dressing : Total assistance;Bed level Upper Body Dressing Details (indicate cue type and reason): Assisted to doff and don gown while supine in bed. Pt able to life L UE for dressing but much assist needed.         Toileting- Clothing Manipulation and Hygiene: Total assistance;Bed level Toileting - Clothing Manipulation Details (indicate cue type and reason): NT assisted pt for peri-care after urination in the bed.             Extremity/Trunk Assessment Upper Extremity Assessment RUE Deficits / Details: no change in status LUE Deficits / Details: No change from evaluation. Intermittent movement/functional use. Able to grasp railing during rolling in bed.            Vision   Vision Assessment?: Vision impaired- to be further tested in functional context Additional Comments: Continued R visual gaze preference at times.   Perception Perception Perception: Not tested   Praxis Praxis Praxis: Not tested   Communication Communication Communication: Impaired Factors Affecting Communication: Difficulty expressing self   Cognition Arousal: Alert Behavior During Therapy: Flat affect Cognition: Cognition impaired             OT - Cognition Comments: Pt unable to verbally communicate today. Able to follow commands partially at times.                 Following commands: Impaired Following commands impaired: Follows one step commands inconsistently      Cueing   Cueing Techniques: Gestural cues, Tactile cues, Verbal cues  Exercises             General Comments Working on postrual control at EOB. Pt able to sit with single UE support for 20 to 30 seconds before needing assist due to leaning. Several reps of seated balance today.    Pertinent Vitals/ Pain       Pain Assessment Pain Assessment: Faces Faces Pain Scale: No hurt                                                          Frequency  Min 3X/week        Progress Toward Goals  OT Goals(current goals can now be found in the care plan section)  Progress towards OT goals: Progressing toward goals  Acute Rehab OT Goals Patient Stated Goal: Go to rehab. OT Goal Formulation: With family Time For Goal Achievement: 01/29/24 Potential to Achieve Goals: Fair ADL Goals Pt Will Perform Grooming: with mod assist;sitting Pt Will Perform Upper Body Bathing: with mod assist;sitting Pt Will Perform Upper  Body Dressing: with mod assist;sitting Pt Will Perform Lower Body Dressing: with mod assist;with adaptive equipment;bed level Pt Will Transfer to Toilet: with max assist;stand pivot transfer;with mod assist Pt/caregiver will Perform Home Exercise Program: Increased ROM;Increased strength;Both right and left upper extremity;With minimal assist  Plan      Co-evaluation    PT/OT/SLP Co-Evaluation/Treatment: Yes Reason for Co-Treatment: To address functional/ADL transfers;Complexity of the patient's impairments (multi-system involvement)   OT goals addressed during session: ADL's and self-care                          End of Session Equipment Utilized During Treatment: Gait belt;Rolling walker (2 wheels)  OT Visit Diagnosis: Unsteadiness on feet (R26.81);Other abnormalities  of gait and mobility (R26.89);Muscle weakness (generalized) (M62.81);Other symptoms and signs involving the nervous system (R29.898);Other symptoms and signs involving cognitive function;Cognitive communication deficit (R41.841);Hemiplegia and hemiparesis Symptoms and signs involving cognitive functions: Cerebral infarction Hemiplegia - Right/Left: Right Hemiplegia - dominant/non-dominant: Dominant Hemiplegia - caused by: Cerebral infarction   Activity Tolerance Patient tolerated treatment well   Patient Left in bed;with call bell/phone within reach;with family/visitor present   Nurse Communication          Time: 9082-9053 OT Time Calculation (min): 29 min  Charges: OT General Charges $OT Visit: 1 Visit OT Treatments $Therapeutic Activity: 8-22 mins (One unit out of two taken due to co-treat with PT.)  JAYSON PERSON OT, MOT  JAYSON PERSON 01/16/2024, 10:22 AM

## 2024-01-16 NOTE — Progress Notes (Signed)
 Physical Therapy Treatment Patient Details Name: Celestine Prim MRN: [id number] DOB: 1966-02-21 Today's Date: 01/16/2024   History of Present Illness Micheal Sheen is a 58 y.o. female with medical history significant for stroke with baseline right hemiparesis, diabetes mellitus, CHF, hypertension, hypothyroidism.  Patient was brought to the ED via EMS with reports of unresponsiveness, right gaze, and aphasia.  Family reported that patient was last known well at about 8 PM last night when she went to bed.  This morning when she woke up she is not feeling well and was not talking much at all, it was about 12:30 PM after she woke up from a nap and I did notice that she was not talking at all.  EMS noted that she was gazing to the right on the following directions.  On my evaluation, patient is awake, but gazing to the right and unable to answer questions.    PT Comments  Patient presents lethargic, but became more alert after sitting up at bedside. Patient demonstrates slow labored movement for sitting up at bedside, once seated had frequent leaning/falling backwards due to poor trunk control, after verbal/tactile cueing patient able to keep trunk in midline for up to 30-40 seconds and limited to partial standing due to BLE weakness. Patient put back to bed with Max assist for repositioning. Patient will benefit from continued skilled physical therapy in hospital and recommended venue below to increase strength, balance, endurance for safe ADLs and gait.      If plan is discharge home, recommend the following: A lot of help with bathing/dressing/bathroom;Help with stairs or ramp for entrance;A lot of help with walking and/or transfers;Assistance with cooking/housework;Assist for transportation   Can travel by private vehicle     No  Equipment Recommendations  None recommended by PT    Recommendations for Other Services       Precautions / Restrictions Precautions Precautions: Fall Recall of  Precautions/Restrictions: Impaired Restrictions Weight Bearing Restrictions Per Provider Order: No     Mobility  Bed Mobility Overal bed mobility: Needs Assistance Bed Mobility: Rolling, Supine to Sit, Sit to Supine Rolling: Max assist, Used rails   Supine to sit: Max assist, Total assist Sit to supine: Max assist, Total assist   General bed mobility comments: slow labored movment due to weakness    Transfers Overall transfer level: Needs assistance Equipment used: 1 person hand held assist Transfers: Sit to/from Stand Sit to Stand: Max assist, Total assist           General transfer comment: partial standing only due to BLE weakness, poor carryover for using LUE to hold onto RW    Ambulation/Gait                   Stairs             Wheelchair Mobility     Tilt Bed    Modified Rankin (Stroke Patients Only)       Balance Overall balance assessment: Needs assistance Sitting-balance support: Feet supported, Single extremity supported Sitting balance-Leahy Scale: Poor Sitting balance - Comments: able to keep trunk in midline for up to 30-40 seconds before falling backwards Postural control: Posterior lean Standing balance support: Single extremity supported, During functional activity Standing balance-Leahy Scale: Poor Standing balance comment: Poor to zero using RW in L UE.                            Communication Communication Communication: Impaired  Factors Affecting Communication: Difficulty expressing self  Cognition Arousal: Lethargic, Alert Behavior During Therapy: Flat affect                             Following commands: Impaired Following commands impaired: Follows one step commands inconsistently    Cueing Cueing Techniques: Gestural cues, Tactile cues, Verbal cues  Exercises      General Comments General comments (skin integrity, edema, etc.): Working on [employer] control at EOB. Pt able to sit with  single UE support for 20 to 30 seconds before needing assist due to leaning. Several reps of seated balance today.      Pertinent Vitals/Pain Pain Assessment Pain Assessment: Faces Pain Score: 0-No pain    Home Living                          Prior Function            PT Goals (current goals can now be found in the care plan section) Acute Rehab PT Goals Patient Stated Goal: not stated. Family wants patient to return home after rehab PT Goal Formulation: With patient/family Time For Goal Achievement: 01/29/24 Potential to Achieve Goals: Fair Progress towards PT goals: Progressing toward goals    Frequency    Min 3X/week      PT Plan      Co-evaluation PT/OT/SLP Co-Evaluation/Treatment: Yes Reason for Co-Treatment: To address functional/ADL transfers;Complexity of the patient's impairments (multi-system involvement) PT goals addressed during session: Mobility/safety with mobility;Balance OT goals addressed during session: ADL's and self-care      AM-PAC PT 6 Clicks Mobility   Outcome Measure  Help needed turning from your back to your side while in a flat bed without using bedrails?: A Lot Help needed moving from lying on your back to sitting on the side of a flat bed without using bedrails?: A Lot Help needed moving to and from a bed to a chair (including a wheelchair)?: A Lot Help needed standing up from a chair using your arms (e.g., wheelchair or bedside chair)?: Total Help needed to walk in hospital room?: Total Help needed climbing 3-5 steps with a railing? : Total 6 Click Score: 9    End of Session   Activity Tolerance: Patient tolerated treatment well;Patient limited by fatigue Patient left: in bed;with call bell/phone within reach;with family/visitor present Nurse Communication: Mobility status PT Visit Diagnosis: Unsteadiness on feet (R26.81);Other abnormalities of gait and mobility (R26.89);Muscle weakness (generalized) (M62.81)      Time: 9087-9056 PT Time Calculation (min) (ACUTE ONLY): 31 min  Charges:    $Therapeutic Activity: 23-37 mins PT General Charges $$ ACUTE PT VISIT: 1 Visit                     1:42 PM, 01/16/24 Lynwood Music, MPT Physical Therapist with [facility] [phone] office [phone] mobile phone

## 2024-01-16 NOTE — Progress Notes (Signed)
 Nutrition Follow-up  DOCUMENTATION CODES:   Obesity unspecified  INTERVENTION:   -RD will follow for goals of care. If TF is indicated, recommend:   Initiate Osmolite 1.5 @ 25 ml/hr and increase by 10 ml every 12 hours to goal rate of 45 ml/hr.    60 ml Prosource TF20 daily   Tube feeding regimen provides 1700 kcal (100% of needs), 88 grams of protein, and 823 ml of H2O.     -Monitor Mg, K, and Phos and replete as needed secondary to high refeeding risk -MVI with minerals daily via tube -100 mg thiamine daily x 7 days  NUTRITION DIAGNOSIS:   Inadequate oral intake related to inability to eat as evidenced by NPO status.  Ongoing  GOAL:   Patient will meet greater than or equal to 90% of their needs  Unmet  MONITOR:   Diet advancement  REASON FOR ASSESSMENT:   Malnutrition Screening Tool    ASSESSMENT:   Pt with medical history significant for stroke with baseline right hemiparesis, diabetes mellitus, CHF, hypertension, hypothyroidism. Pt admitted with with reports of unresponsiveness, right gaze, and aphasia.  10/1- s/p BSE- NPO 10/2- s/p BSE- NPO  Reviewed I/O's: +1.5 L x 24 hours and +1.2 L since admission  Case discussed in interdisciplinary rounds.   General surgery consulted for PEG placement. Pt is at high risk to be put under anesthesia and high risk for complications (should not be given for at least 30 days from CVA event). Recommending IR for PEG or NGT placement.   Palliative care following for goals of care; considering feeding tube placement.   No new wt since last visit.   Medications reviewed and include lovenox  and lactated ringers  infusion with potassium chloride  @ 50 ml/hr.   Labs reviewed: CBGS: 113-153 (inpatient orders for glycemic control are 0-9 units insulin  aspart every 6 hours).    Diet Order:   Diet Order             Diet NPO time specified  Diet effective now                   EDUCATION NEEDS:   No education needs  have been identified at this time  Skin:  Skin Assessment: Reviewed RN Assessment  Last BM:  01/16/24 (type 6)  Height:   Ht Readings from Last 1 Encounters:  01/14/24 5' 5 (1.651 m)    Weight:   Wt Readings from Last 1 Encounters:  01/14/24 83.1 kg    Ideal Body Weight:  56.8 kg  BMI:  Body mass index is 30.49 kg/m.  Estimated Nutritional Needs:   Kcal:  1700-1900  Protein:  85-100 grams  Fluid:  1.7-1.9 L    Margery ORN, RD, LDN, CDCES Registered Dietitian III Certified Diabetes Care and Education Specialist If unable to reach this RD, please use RD Inpatient group chat on secure chat between hours of 8am-4 pm daily

## 2024-01-16 NOTE — Progress Notes (Signed)
 Daily Progress Note   [name]       Date: 01/16/2024 DOB: [birth year]  Age: 58 y.o. MRN#: [id number] Attending Physician: Willette Adriana LABOR, MD Primary Care Physician: Bobbette Coye LABOR, MD Admit Date: 01/14/2024  Reason for Consultation/Follow-up: Establishing goals of care  Subjective:  58 y.o. female  with past medical history of CHF, ADD, bronchitis, depression, type 2 diabetes, hypertension, obesity, prior CVA, chronic combined systolic and diastolic heart failure, vitamin D  deficiency, and hypothyroidism due to Hashimoto's thyroiditis admitted on 01/14/2024 with right-sided weakness and aphasia.    Workup revealed Acute infarcts in the right corona radiata, caudate, lentiform nucleus, and external capsule.  Also noted to have extensive microvascular ischemic changes and changes consistent with possible component of hypertensive encephalopathy.  Per prior notes, patient has a history of prior strokes.  Deficits include right-sided weakness, aphasia, and dysphagia.  01/15/2024: initial completed, discussed GOC/ACP. Transitioned from full code to DNR/DNI. Family considering transition to comfort care vs feeding tube, time for outcomes.   Today, labs independently reviewed. BMP reveals some mild hyperglycemia.  Renal function slightly declined creatinine bumped from 1 yesterday to 1.24 today and eGFR 51.  Continue IV fluids and ongoing monitoring.  Echocardiogram reviewed left EF 25%.  Regional wall abnormalities consistent with Takotsubo cardiomyopathy.  Vital signs reviewed. Hydromorphone  0.5 mg IV on 24 hr lookback. Compazine  10 mg IV given on 24 hr lookback. Otherwise, no PRN symptom meds administered. Seen by therapy (ST/OT) and RD. SNF recommended by OT. ST recommends NPO. RD Recommends tube feeding.   Independent  history obtained from nursing staff and family as patient is nonverbal due to severe aphasia.  Per nursing, patient continues to be severely aphasic.  Was moving left side quite a bit yesterday.  Now she is resting quietly.  All meds/fluids being given via IV given n.p.o. status.  Met with patient and family at bedside (patient's mother and father).  Discussed recent lab and diagnostic findings, therapy recommendations, and treatment options (focusing on comfort and quality of life versus continuing with intensive medical interventions).  Discussed discussed that therapy recommended SNF for skilled rehab (likely PT/OT/ST).  We discussed that this time she is n.p.o. and unsafe to swallow any pills, fluids, or nutrition.  We discussed that if decision is made to pursue skilled nursing facility, she will need a feeding tube in order to provide nutrition, hydration, and medication administration during this time.  Discussed that she may have variable response to therapy and if goals of care continue to be to optimize comfort and quality of life then they would benefit from  outpatient palliative follow-up. Educated the family on the philosophy and goals of palliative care, including its focus on symptom management and quality of life. Reviewed eligibility for outpatient services and offered a referral.  We also discussed that if they prefer to focus only on comfort and quality of life that we would not place feeding tube and would initiate medications via IV or buccal route to help manage pain and discomfort and engage the support of hospice to ensure adequate family support and symptom needs were being met. Reviewed hospice philosophy of care, including focus on comfort, quality of life, and support for patients and families facing terminal illness. Discussed eligibility criteria and potential benefits of enrollment.  [name] does not desire for her to undergo a feeding tube due to concerns for quality of  life however, patient's father does feel she would benefit from skilled therapy.  Plan is to reach out to surgery team to see if they can facilitate initiation of a PEG tube in the abdomen versus cortrack in order to limit patient discomfort and as anticipated longer-term need for tube feeding then can accomplish with cortrack alone.  Follow up: consulted gen surgery. Unable to put PEG in under surgical guidance due to EF 25% and recent stroke. Surgery recommends Dobhoff tube. Updated patient's parents at bedside. They are in agreement with feeding tube by any route although preferred PEG. They are also in agreement to d/c home with SNF for STR with the ultimate plan to return home.   Chart review/care coordination:  Completed extensive chart review including EPIC notes, labs (independently reviewed), MAR, and vital signs. Coordinated care with attending physician, surgeon, bedside nursing staff, and TOC.    Length of Stay: 2   Physical Exam Constitutional:      Comments: Sleeping quietly  HENT:     Mouth/Throat:     Mouth: Mucous membranes are dry.  Pulmonary:     Effort: Pulmonary effort is normal. No respiratory distress.  Skin:    General: Skin is warm and dry.             Vital Signs: BP (!) 188/107   Pulse 84   Temp 98.4 F (36.9 C) (Axillary)   Resp 16   Ht 5' 5 (1.651 m)   Wt 83.1 kg   SpO2 95%   BMI 30.49 kg/m  SpO2: SpO2: 95 % O2 Device: O2 Device: Room Air O2 Flow Rate:        Palliative Assessment/Data: 10-20%   Palliative Care Assessment & Plan   Patient Profile/Assessment:  58 year old female with complex medical history including prior stroke who was admitted to the hospital on 01/14/2024 and diagnosed with recurrent stroke.  Patient with profound deficits.  At baseline, she was bedbound/total care for all ADLs due to right sided hemiparesis from prior stroke.  Now globally aphasic and with severe dysphagia that requires n.p.o. status.  Initial completed  01/15/2024.  She was transition from full code to DNR/DNI.  Family was hopeful for evaluation for therapy to determine rehab potential and next steps.  Discussion regarding transition to comfort care versus placement of PEG tube for SNF with outpatient palliative follow-up. Ultimately family decides to proceed with PEG tube, STR in SNF, and OP Pall follow up.    Recommendations/Plan:  DNR/DNI Continue to treat the treatable, time for outcomes Appears comfortable at present, continue current symptom regimen Feeding tube to be placed for nutritional support/med administration Continue PT/OT/ST while inpatient D/c to SNF for short term  rehab Outpatient palliative referral  Palliative medicine team will continue to follow for ongoing goals of care discussion, symptom management, and coordination of care.   Symptom management:  Continue hydromorphone  0.5 mg IV every 2 hours as needed (only one dose needed on 24 hr look back, appears comfortable on exam) Continue Tylenol  PR as needed Continue Compazine  10 mg every 6 hours as needed  Prognosis: Guarded, poor    Discharge Planning: To Be Determined    Detailed review of medical records (labs, imaging, vital signs), medically appropriate exam, discussed with treatment team, counseling and education to patient, family, & staff, documenting clinical information, medication management, coordination of care .   Billing based on MDM: High  Problems Addressed: One acute or chronic illness or injury that poses a threat to life or bodily function  Amount and/or Complexity of Data: Category 1:Assessment requiring an independent historian(s) and Category 3:Discussion of management or test interpretation with external physician/other qualified health care professional/appropriate source (not separately reported)  Risks: Parenteral controlled substances         Laymon CHRISTELLA Pinal, NP  Palliative Medicine Team Team phone #  [phone]  Thank you for allowing the Palliative Medicine Team to assist in the care of this patient. Please utilize secure chat with additional questions, if there is no response within 30 minutes please call the above phone number.  Palliative Medicine Team providers are available by phone from 7am to 7pm daily and can be reached through the team cell phone.  Should this patient require assistance outside of these hours, please call the patient's attending physician.

## 2024-01-16 NOTE — Consult Note (Addendum)
 Requested consultation for possible PEG placement.  I did review the chart.  Of significance is the patient is approximately 48 hours out from an acute CVA.  In addition, she has a cardiac ejection fraction of only 25%.  As a PEG is placed at [facility] under anesthesia, patient is at high risk for complication and anesthesia is contraindicated.  I did discuss this with Anesthesia who agree.  It is recommended that unless it is an emergency, anesthesia should not be given for at least 30 days from the CVA event.  At this point, I would place a Dobbhoff feeding tube per radiology.  The only other consideration is IR placement of a PEG tube.  This was discussed with Dr. Twana.  I would be more than happy to see the patient after 30 days for PEG consideration.

## 2024-01-16 NOTE — Progress Notes (Signed)
 PROGRESS NOTE    Patient: Connie Shepard                            PCP: Bobbette Coye LABOR, MD                    DOB: 23-Sep-1965            DOA: 01/14/2024 FMW:[id number]             DOS: 01/16/2024, 11:00 AM   LOS: 2 days   Date of Service: The patient was seen and examined on 01/16/2024  Subjective:   The patient was seen and examined this morning, awake.  No improvement no progression of her symptoms, remains nonverbal, with dense right-sided hemiplegia, does not withdraw to pain stimuli.  Patient's mom and dad present at bedside. Status post evaluation by speech, patient not able to follow any command or swallow safely Palliative care-continue discussion with family   Brief Narrative:   Connie Shepard is a 58 y.o. female with medical history significant for stroke with baseline Right hemiparesis, DM II, CHF, HTN, hypothyroidism. Patient was brought to the ED via EMS with reports of unresponsiveness, right gaze, and aphasia.  Family reported that patient was last known well at about 8 PM last night when she went to bed.  This morning when she woke up she is not feeling well and was not talking much at all, it was about 12:30 PM after she woke up from a nap and I did notice that she was not talking at all.  EMS noted that she was gazing to the right on the following directions. On my evaluation, patient is awake, but gazing to the right and unable to answer questions.   ED Course: BP 219/155.  Heart rate 102-112.  RR 11-22.   Code stroke called.  Patient outside window for thrombolysis.   Head CT-suggested an acute infarct. Subsequent MRI brain - Acute infarcts in the right corona radiata, caudate, lentiform nucleus, and external capsule. Extensive chronic microvascular ischemic changes, generalized parenchymal volume loss, and multiple remote infarcts. CTA head and neck-severe stenosis in several arteries. Tele-neurology was consulted, okay for patient to be admitted here at [facility].     Assessment & Plan:   Principal Problem:   Acute CVA (cerebrovascular accident) [facility]) Active Problems:   Essential hypertension   Diabetes mellitus (HCC)   Hypothyroidism   Chronic combined systolic and diastolic congestive heart failure (HCC)     Assessment and Plan:   Acute CVA- -presenting with new aphasia, left-sided hemineglect.  Remain nonverbal, unable to swallow, awake   - History of stroke, with baseline right hemiparesis  - MRI brain-  Acute infarcts in the right corona radiata, caudate, lentiform nucleus, and external capsule. Extensive chronic microvascular ischemic changes, generalized parenchymal volume loss, and multiple remote infarcts.   - CTA head and neck reviewed stenosis in multiple vessels. - Code stroke was called on arrival, patient was outside thrombolytics - This was evaluation by neurology recommended aspirin , Plavix , high-dose statin but patient is unable to tolerate p.o., receiving aspirin  rectally  - Extensive aphasia, with dysphagia--monitoring continue neurochecks Prognosis remain poor due to recurrent stroke-severe dysphagia Palliative care consulted-patient's caregiver mom and dad, determined patient to be DNR  - Per speech unable to swallow-made n.p.o.-to determine modes of feeding -[facility] palliative following closely    - TOC consulted, debating for SNF placement, continue with palliative care, if  decline hospice  - Aspirin  and Plavix , allow for permissive hypertension and treat for systolic greater than 220 for the next 24 to 48 hours then start normalizing blood pressure, stroke workup,  - Echocardiogram >>>  EF estimated 25%, severe decreased LV function, finding consistent with Takotsubo cardiomyopathy.  Severe LVH - PT OT speech therapy eval-SNF unable to swallow, considering feeding tube - NPO -  On Aspirin , Plavix , Lipitor as outpatient?  Unable to take meds for past few weeks per caregivers  -Continue aspirin   rectally -Continue IV fluid for hydration    Hypertension Plan for permissive hypertension --will slowly treat hypertension now - Holding home medication: Unable to tolerate p.o. Carvedilol  12.5 mg twice daily, Norvasc 10 mg, losartan  50 mg, spironolactone  and torsemide    Diabetes mellitus -A1c 12/02/2023-8.4.  - SSI- S q6h - Hold home Lantus  while NPO, metformin    Chronic combined systolic and diastolic CHF -stable and compensated.   Last echo 2022 EF 20% with global hypokinesis - Repeating 2D Echo: EF estimated 25%, severe decreased LV function, finding consistent with Takotsubo cardiomyopathy.  Severe LVH - Gentle hydration - Hold torsemide  40 mg twice daily, spironolactone   Ethics: Changed to DNR limited Palliative care consulted, mom and dad present at bedside.  Recurrent stroke, with progressive neurological changes on the new stroke complicated by aphasia, dysphagia-therefore prognosis is poor.  If patient continues to decline may be a candidate for hospice.  If stabilized,  entertaining the possibility of PEG tube placement    ------------------------------------------------------------------------------------------------------------------------------ Nutritional status:  The patient's BMI is: -------------------------------------------------------------------------------------------------------------------------------  DVT prophylaxis:  enoxaparin  (LOVENOX ) injection 40 mg Start: 01/14/24 1930   Code Status:   Code Status: Limited: Do not attempt resuscitation (DNR) -DNR-LIMITED -Do Not Intubate/DNI   Family Communication: No family member present at bedside-  -Advance care planning has been discussed.   Admission status:   Status is: Inpatient Remains inpatient appropriate because: Continue need for progressive recurrent stroke, stroke workup   Disposition: From  - home             Planning for discharge in 2 days SNF with palliative care  Procedures:   No  admission procedures for hospital encounter.   Antimicrobials:  Anti-infectives (From admission, onward)    None        Medication:   aspirin   150 mg Rectal Once   atorvastatin   40 mg Oral Daily   Chlorhexidine  Gluconate Cloth  6 each Topical Q0600   clopidogrel   75 mg Oral Daily   enoxaparin  (LOVENOX ) injection  40 mg Subcutaneous Q24H   insulin  aspart  0-9 Units Subcutaneous Q6H   levothyroxine   100 mcg Oral Q0600    acetaminophen  **OR** acetaminophen  (TYLENOL ) oral liquid 160 mg/5 mL **OR** acetaminophen , HYDROmorphone  (DILAUDID ) injection, labetalol , prochlorperazine , senna-docusate   Objective:   Vitals:   01/16/24 0500 01/16/24 0530 01/16/24 0600 01/16/24 0744  BP: (!) 197/107 (!) 152/96 (!) 188/107   Pulse: 85 86 84   Resp: 13 15 16    Temp:    98.4 F (36.9 C)  TempSrc:    Axillary  SpO2: 97% 97% 95%   Weight:      Height:        Intake/Output Summary (Last 24 hours) at 01/16/2024 1100 Last data filed at 01/16/2024 0555 Gross per 24 hour  Intake 1481.44 ml  Output --  Net 1481.44 ml   Filed Weights   01/14/24 1635  Weight: 83.1 kg     Physical examination:  General:  Awake, nonverbal-moving left upper and lower extremity spontaneously with no purpose  HEENT:  Normocephalic, PERRL, otherwise with in Normal limits   Neuro:  Limited exam Nonverbal, arousable, eyes tracking Involuntary left upper and lower extremity movement, dense right upper and lower extremity weakness, with contraction of right upper extremity/hand  Lungs:   Clear to auscultation BL, Respirations unlabored,  No wheezes / crackles  Cardio:    S1/S2, RRR, No murmure, No Rubs or Gallops   Abdomen:  Soft, non-tender, bowel sounds active all four quadrants, no guarding or peritoneal signs.  Muscular  skeletal:  Limited exam -nonverbal, dense right-sided weakness,  continuous movement of the left upper lower extremity 2+ pulses,  symmetric, +1 pitting edema  Skin:  Dry,  warm to touch, negative for any Rashes,  Wounds: Please see nursing documentation    -----------------------------------------------------------------------------------------------------------------------------    LABs:     Latest Ref Rng & Units 01/14/2024    1:34 PM 01/14/2024    1:31 PM 08/27/2020    4:34 AM  CBC  WBC 4.0 - 10.5 K/uL  7.8  6.6   Hemoglobin 12.0 - 15.0 g/dL 80.9  80.5  87.2   Hematocrit 36.0 - 46.0 % 56.0  55.9  39.8   Platelets 150 - 400 K/uL  216  192       Latest Ref Rng & Units 01/16/2024    4:52 AM 01/14/2024    1:34 PM 01/14/2024    1:31 PM  CMP  Glucose 70 - 99 mg/dL 866  829  833   BUN 6 - 20 mg/dL 13  10  9    Creatinine 0.44 - 1.00 mg/dL 8.75  8.99  9.03   Sodium 135 - 145 mmol/L 143  140  140   Potassium 3.5 - 5.1 mmol/L 3.5  3.2  3.4   Chloride 98 - 111 mmol/L 103  99  99   CO2 22 - 32 mmol/L 25   22   Calcium  8.9 - 10.3 mg/dL 89.8   89.8   Total Protein 6.5 - 8.1 g/dL   8.1   Total Bilirubin 0.0 - 1.2 mg/dL   0.9   Alkaline Phos 38 - 126 U/L   121   AST 15 - 41 U/L   22   ALT 0 - 44 U/L   25        Micro Results Recent Results (from the past 240 hours)  MRSA Next Gen by PCR, Nasal     Status: None   Collection Time: 01/14/24  4:32 PM   Specimen: Nasal Mucosa; Nasal Swab  Result Value Ref Range Status   MRSA by PCR Next Gen NOT DETECTED NOT DETECTED Final    Comment: (NOTE) The GeneXpert MRSA Assay (FDA approved for NASAL specimens only), is one component of a comprehensive MRSA colonization surveillance program. It is not intended to diagnose MRSA infection nor to guide or monitor treatment for MRSA infections. Test performance is not FDA approved in patients less than 54 years old. Performed at [facility], [address], [place], [place] [address]     Radiology Reports No results found.   SIGNED: Adriana DELENA Grams, MD, FHM. FAAFP. Jol[facility] - [facility] Critical care time spent - 55 min.  In seeing, evaluating and  examining the patient. Reviewing medical records, labs, drawn plan of care. [facility],  Pager (please use [email or web address] to page/ text) Please use Epic Secure Chat for non-urgent communication (7AM-7PM)  If 7PM-7AM, please  contact night-coverage [email or web address], 01/16/2024, 11:00 AM

## 2024-01-17 ENCOUNTER — Inpatient Hospital Stay (HOSPITAL_COMMUNITY)

## 2024-01-17 ENCOUNTER — Encounter (HOSPITAL_COMMUNITY): Payer: Self-pay | Admitting: Internal Medicine

## 2024-01-17 DIAGNOSIS — Z79899 Other long term (current) drug therapy: Secondary | ICD-10-CM | POA: Diagnosis not present

## 2024-01-17 DIAGNOSIS — I639 Cerebral infarction, unspecified: Secondary | ICD-10-CM | POA: Diagnosis not present

## 2024-01-17 DIAGNOSIS — Z7189 Other specified counseling: Secondary | ICD-10-CM | POA: Diagnosis not present

## 2024-01-17 DIAGNOSIS — Z515 Encounter for palliative care: Secondary | ICD-10-CM | POA: Diagnosis not present

## 2024-01-17 DIAGNOSIS — I69391 Dysphagia following cerebral infarction: Secondary | ICD-10-CM

## 2024-01-17 LAB — BASIC METABOLIC PANEL WITH GFR
Anion gap: 15 (ref 5–15)
BUN: 12 mg/dL (ref 6–20)
CO2: 24 mmol/L (ref 22–32)
Calcium: 9.8 mg/dL (ref 8.9–10.3)
Chloride: 104 mmol/L (ref 98–111)
Creatinine, Ser: 0.9 mg/dL (ref 0.44–1.00)
GFR, Estimated: >60 mL/min (ref >60)
Glucose, Bld: 142 mg/dL — ABNORMAL HIGH (ref 70–99)
Potassium: 3.4 mmol/L — ABNORMAL LOW (ref 3.5–5.1)
Sodium: 143 mmol/L (ref 135–145)

## 2024-01-17 LAB — GLUCOSE, CAPILLARY
Glucose-Capillary: 120 mg/dL — ABNORMAL HIGH (ref 70–99)
Glucose-Capillary: 127 mg/dL — ABNORMAL HIGH (ref 70–99)
Glucose-Capillary: 143 mg/dL — ABNORMAL HIGH (ref 70–99)
Glucose-Capillary: 150 mg/dL — ABNORMAL HIGH (ref 70–99)
Glucose-Capillary: 169 mg/dL — ABNORMAL HIGH (ref 70–99)
Glucose-Capillary: 173 mg/dL — ABNORMAL HIGH (ref 70–99)

## 2024-01-17 MED ORDER — HYDRALAZINE HCL 20 MG/ML IJ SOLN
10.0000 mg | Freq: Once | INTRAMUSCULAR | Status: AC
  Administered 2024-01-17: 10 mg via INTRAVENOUS
  Filled 2024-01-17: qty 1

## 2024-01-17 MED ORDER — POTASSIUM CHLORIDE 2 MEQ/ML IV SOLN
INTRAVENOUS | Status: DC
  Filled 2024-01-17 (×2): qty 1000

## 2024-01-17 MED ORDER — KCL-LACTATED RINGERS-D5W 20 MEQ/L IV SOLN
INTRAVENOUS | Status: DC
  Filled 2024-01-17: qty 1000

## 2024-01-17 MED ORDER — CLONIDINE HCL 0.3 MG/24HR TD PTWK: 0.3000 mg | MEDICATED_PATCH | TRANSDERMAL | Status: DC

## 2024-01-17 MED ORDER — OSMOLITE 1.5 CAL PO LIQD
1000.0000 mL | ORAL | Status: AC
Start: 2024-01-17 — End: 2024-01-29
  Administered 2024-01-17 – 2024-01-28 (×6): 1000 mL

## 2024-01-17 MED ORDER — ADULT MULTIVITAMIN W/MINERALS CH
1.0000 | ORAL_TABLET | Freq: Every day | ORAL | Status: DC
  Administered 2024-01-17 – 2024-01-29 (×9): 1
  Filled 2024-01-17 (×10): qty 1

## 2024-01-17 MED ORDER — HYDRALAZINE HCL 20 MG/ML IJ SOLN
10.0000 mg | INTRAMUSCULAR | Status: DC | PRN
  Administered 2024-01-17 – 2024-01-20 (×12): 10 mg via INTRAVENOUS
  Filled 2024-01-17 (×12): qty 1

## 2024-01-17 MED ORDER — VITAL HP 1.0 CAL PO LIQD
1000.0000 mL | ORAL | Status: DC
Start: 2024-01-17 — End: 2024-01-17

## 2024-01-17 MED ORDER — CLONIDINE HCL 0.1 MG/24HR TD PTWK
0.1000 mg | MEDICATED_PATCH | TRANSDERMAL | Status: DC
  Administered 2024-01-17: 0.1 mg via TRANSDERMAL
  Filled 2024-01-17: qty 1

## 2024-01-17 MED ORDER — PROSOURCE TF20 ENFIT COMPATIBL EN LIQD
60.0000 mL | Freq: Every day | ENTERAL | Status: DC
  Administered 2024-01-17 – 2024-01-29 (×9): 60 mL
  Filled 2024-01-17 (×9): qty 60

## 2024-01-17 MED ORDER — IOHEXOL 300 MG/ML  SOLN
50.0000 mL | Freq: Once | INTRAMUSCULAR | Status: AC | PRN
  Administered 2024-01-17: 50 mL

## 2024-01-17 MED ORDER — THIAMINE MONONITRATE 100 MG PO TABS
100.0000 mg | ORAL_TABLET | Freq: Every day | ORAL | Status: AC
  Administered 2024-01-17 – 2024-01-23 (×3): 100 mg
  Filled 2024-01-17 (×4): qty 1

## 2024-01-17 MED ORDER — CLONIDINE HCL 0.3 MG/24HR TD PTWK
0.3000 mg | MEDICATED_PATCH | TRANSDERMAL | Status: DC
Start: 2024-01-17 — End: 2024-01-29
  Administered 2024-01-17 – 2024-01-24 (×2): 0.3 mg via TRANSDERMAL
  Filled 2024-01-17 (×2): qty 1

## 2024-01-17 NOTE — Progress Notes (Signed)
 Daily Progress Note   [name]       Date: 01/17/2024 DOB: 1965/11/19  Age: 58 y.o. MRN#: [id number] Attending Physician: Willette Adriana LABOR, MD Primary Care Physician: Bobbette Coye LABOR, MD Admit Date: 01/14/2024  Reason for Consultation/Follow-up: Establishing goals of care  Subjective:  58 y.o. female  with past medical history of CHF, ADD, bronchitis, depression, type 2 diabetes, hypertension, obesity, prior CVA, chronic combined systolic and diastolic heart failure, vitamin D  deficiency, and hypothyroidism due to Hashimoto's thyroiditis admitted on 01/14/2024 with right-sided weakness and aphasia.    Workup revealed Acute infarcts in the right corona radiata, caudate, lentiform nucleus, and external capsule.  Also noted to have extensive microvascular ischemic changes and changes consistent with possible component of hypertensive encephalopathy.  Per prior notes, patient has a history of prior strokes.  Deficits include right-sided weakness, aphasia, and dysphagia.   01/15/2024: initial completed, discussed GOC/ACP. Transitioned from full code to DNR/DNI. Family considering transition to comfort care vs feeding tube, time for outcomes.   01/16/2024: further GOC discussion. SNF for rehab with palliative care vs home with hospice support. Family hopeful she will make some improvements so preferred SNF for rehab. However, they are hesitant about feeding tube in the nose due to discomfort and would prefer PEG tube be placed. Care coordination was in process.   Today, labs independently reviewed.  Mild hypokalemia noted.  Slightly elevated blood sugar at 142.  Patient with known type 2 diabetes.  Renal function stable. Monitor. CT scan of the abdomen and pelvis was completed 01/16/2024 to assist with ensuring anatomic safety for  PEG tube insertion.  Imaging independently reviewed.  Stomach appears normal.  I do not see any organs that would prevent access to the stomach via percutaneous access.  She has significant plaque buildup noted to abdominal aorta.  Vital signs reviewed.  Patient continues to be significantly hypertensive (per neuro recs permissive hypertension recommended).  Other vital signs stable.  Normal sats on room air.  Medication administration record reviewed.  Patient has received a total of 1 mg of IV Dilaudid  on 24-hour look back.  Otherwise, no symptom meds administered.  Independent history obtained from nursing, rehab staff, and family as patient is severely aphasic and unable to contribute to history.  Per nursing, patient appears more alert today and seems to be making more purposeful movements.  She continues to be hypertensive.  Gradual improvements noted per nursing.  I also spoke with patient's speech therapist.  She reports some slight but notable improvements today.  Able to tolerate ice chips.  Having some purposeful attempts at  communication.  She feels she would continue to improve with further therapy involvement.  Spoke with patient's mother and father at bedside.  Discussed ongoing goals of care and advance care planning.  [name] have seen some notable improvements in patient today.  Therefore, they wish to pursue SNF for short-term rehab and outpatient palliative to follow for support.  We discussed that if SNF and therapy are the plan we would need to proceed with placement of an enteric feeding tube.  They have been hoping to avoid NG tube however, we discussed that patient needs nutritional support sooner versus later in order to help with recovery and at this point the best way to facilitate this would be with placing an enteric tube via nasal route.  Updated family on the option for enteric tube via nasal route today and then transfer to [facility] early next week for PEG tube  placement then discharged to SNF for short-term rehab.  Family is in agreement with this plan.  Assured family that we would administer pain medication prior to procedure to hopefully ensure minimal discomfort with procedure.  Patient's family is grateful for this.  We also discussed some additional support options for family once she discharges from the SNF.  Family will look into these additional options as their goal is to care for her in the home as long as possible.  Chart review/care coordination:  Completed extensive chart review including EPIC notes, labs independently reviewed, imaging independently reviewed, medication administration record, vital signs. Coordinated care with attending physician, surgeon, bedside nursing staff, speech therapy, and IR physicians assistant.  Coordination of care: Unable to place feeding tube via surgical route due to poor EF and risk for bleeding with DAPT.  Surgery recommends placement of enteral tube for feeding if needed. Patient in on DAPT due to recent CVA and holding would be contraindicated. PEG cannot be placed until early next week at [facility]. Therefore, IR graciously agreed to insert Dobhoff tube for temporary artificial nutrition/hydration/med management until PEG can be placed. Dobhoff selected over NG tube due to less risk for nasal trauma and subsequent bleeding.  Family in agreement with this plan.  Length of Stay: 3   Physical Exam Constitutional:      General: She is not in acute distress.    Appearance: She is obese.  Pulmonary:     Effort: Pulmonary effort is normal. No respiratory distress.  Skin:    General: Skin is warm and dry.  Neurological:     Mental Status: She is alert.     Comments: Completely aphasic but does grasp hand of this examiner with left hand.  Purposeful movement to left side noted.  Right sided paralysis noted             Vital Signs: BP (!) 210/113   Pulse 81   Temp 97.7 F (36.5 C) (Axillary)   Resp 15    Ht 5' 5 (1.651 m)   Wt 83.1 kg   SpO2 100%   BMI 30.49 kg/m  SpO2: SpO2: 100 % O2 Device: O2 Device: Room Air O2 Flow Rate:        Palliative Assessment/Data: Currently: 20%   Palliative Care Assessment & Plan   Patient Profile/Assessment:  58 year old female with complex medical history including prior stroke who was admitted to the hospital on 01/14/2024 and diagnosed with recurrent stroke.  Patient with profound deficits.  At baseline, she was bedbound/total care for all ADLs due to right sided hemiparesis from prior  stroke.  Now globally aphasic and with severe dysphagia that requires n.p.o. status.  Initial completed 01/15/2024.  She was transition from full code to DNR/DNI.  Family was hopeful for evaluation for therapy to determine rehab potential and next steps.  Discussion regarding transition to comfort care versus placement of PEG tube for SNF with outpatient palliative follow-up. Ultimately family decides to proceed with PEG tube, STR in SNF, and OP Pall follow up.  Unable to place PEG tube until early next week and needing to initiate enteral nutrition prior to this time frame.  Therefore, plan is to proceed with Dobbhoff tube for enteral nutrition until able to obtain PEG.  Symptoms appear stable on current regimen.  Palliative team will continue to support holistically.   Recommendations/Plan:  DNR/DNI Continue to treat the treatable, time for outcomes Appears comfortable at present, continue current symptom regimen Orders placed for IR to initiate Dobbhoff tube today then transition to [facility] early next week for insertion of PEG tube  Continue PT/OT/ST while inpatient D/c to SNF for short term rehab Outpatient palliative referral  Palliative medicine team will continue to follow for ongoing goals of care discussion, symptom management, and coordination of care.   Symptom management:  Continue hydromorphone  0.5 mg IV every 2 hours as needed (2 doses given on 24-hour  look back in 1 to be administered prior to procedure today) Continue Tylenol  PR as needed Continue Compazine  10 mg every 6 hours as needed  Prognosis: Guarded, improving    Discharge Planning: Skilled Nursing Facility for rehab with Palliative care service follow-up    Detailed review of medical records (labs, imaging, vital signs), medically appropriate exam, discussed with treatment team, counseling and education to patient, family, & staff, documenting clinical information, medication management, coordination of care    Billing based on MDM: High  Problems Addressed: One acute or chronic illness or injury that poses a threat to life or bodily function  Amount and/or Complexity of Data: Category 1:Assessment requiring an independent historian(s), Category 2:Independent interpretation of a test performed by another physician/other qualified health care professional (not separately reported), and Category 3:Discussion of management or test interpretation with external physician/other qualified health care professional/appropriate source (not separately reported)  Risks: Parenteral controlled substances         Laymon CHRISTELLA Pinal, NP  Palliative Medicine Team Team phone # [phone]  Thank you for allowing the Palliative Medicine Team to assist in the care of this patient. Please utilize secure chat with additional questions, if there is no response within 30 minutes please call the above phone number.  Palliative Medicine Team providers are available by phone from 7am to 7pm daily and can be reached through the team cell phone.  Should this patient require assistance outside of these hours, please call the patient's attending physician.

## 2024-01-17 NOTE — Progress Notes (Signed)
 PROGRESS NOTE    Patient: Connie Shepard                            PCP: Bobbette Coye LABOR, MD                    DOB: [birth year]            DOA: 01/14/2024 FMW:[id number]             DOS: 01/17/2024, 1:38 PM   LOS: 3 days   Date of Service: The patient was seen and examined on 01/17/2024  Subjective:   The patient was seen and examined this morning, more awake, following some commands, nonverbal Moving left upper arm Tracking, with significant left-sided neglect     Brief Narrative:   Connie Shepard is a 58 y.o. female with medical history significant for stroke with baseline Right hemiparesis, DM II, CHF, HTN, hypothyroidism. Patient was brought to the ED via EMS with reports of unresponsiveness, right gaze, and aphasia.  Family reported that patient was last known well at about 8 PM last night when she went to bed.  This morning when she woke up she is not feeling well and was not talking much at all, it was about 12:30 PM after she woke up from a nap and I did notice that she was not talking at all.  EMS noted that she was gazing to the right on the following directions. On my evaluation, patient is awake, but gazing to the right and unable to answer questions.   ED Course: BP 219/155.  Heart rate 102-112.  RR 11-22.   Code stroke called.  Patient outside window for thrombolysis.   Head CT-suggested an acute infarct. Subsequent MRI brain - Acute infarcts in the right corona radiata, caudate, lentiform nucleus, and external capsule. Extensive chronic microvascular ischemic changes, generalized parenchymal volume loss, and multiple remote infarcts. CTA head and neck-severe stenosis in several arteries. Tele-neurology was consulted, okay for patient to be admitted here at [facility].     Assessment & Plan:   Principal Problem:   Acute CVA (cerebrovascular accident) [facility]) Active Problems:   Essential hypertension   Diabetes mellitus (HCC)   Hypothyroidism   Chronic combined  systolic and diastolic congestive heart failure (HCC)     Assessment and Plan:   Acute CVA- -Remains aphasic, with right-sided hemiplegia, with right upper arm contracted -Now moving left upper extremities, minimal left lower extremity - Severe dysphagia -status post extensive evaluation by speech - Palliative care following closely Final decision to place [facility], with plan to coordinate with IR for placement of PEG tube on Monday, 01/20/2024  -Subsequent plan to discharge to SNF with PEG tube, with close palliative care follow-up and further decline considering hospice  .---------------------------------------------------------------------- Remain nonverbal, unable to swallow, awake   POA: History of stroke, with baseline right hemiparesis  - MRI brain-  Acute infarcts in the right corona radiata, caudate, lentiform nucleus, and external capsule. Extensive chronic microvascular ischemic changes, generalized parenchymal volume loss, and multiple remote infarcts.   - CTA head and neck reviewed stenosis in multiple vessels.  - Neurology consulted, recommended: aspirin , Plavix , x 3 months then continue with aspirin  325 daily For now continue with aspirin  and 25 mg rectally- high-dose statin  -but patient is unable to tolerate p.o.,     Prognosis remain poor due to recurrent stroke-severe dysphagia Palliative care consulted-patient's caregiver mom and dad, determined patient  to be DNR  - Per speech unable to swallow-made n.p.o.-to determine modes of feeding     - TOC consulted, debating for SNF placement, continue with palliative care, if decline hospice  - Permissive hypertension was allowed now starting as needed IV hydralazine , clonidine patch  - Echocardiogram >>>  EF estimated 25%, severe decreased LV function, finding consistent with Takotsubo cardiomyopathy.  Severe LVH  - PT OT speech therapy eval-SNF unable to swallow, considering feeding tube - NPO   -Continue  aspirin  rectally -Continue IV fluid for hydration    Hypertension -Permissive hypertension was allowed now starting as needed hydralazine , clonidine patch Plan for permissive hypertension --will slowly treat hypertension now - Holding home medication: Unable to tolerate p.o. Carvedilol  12.5 mg twice daily, Norvasc 10 mg, losartan  50 mg, spironolactone  and torsemide    Diabetes mellitus -A1c 12/02/2023-8.4.  - SSI- S q6h - Hold home Lantus  while NPO, metformin    Chronic combined systolic and diastolic CHF -stable and compensated.   Last echo 2022 EF 20% with global hypokinesis - Repeating 2D Echo: EF estimated 25%, severe decreased LV function, finding consistent with Takotsubo cardiomyopathy.  Severe LVH - Gentle hydration - Hold torsemide  40 mg twice daily, spironolactone   Ethics: Changed to DNR limited Palliative care consulted, mom and dad present at bedside.  Recurrent stroke, with progressive neurological changes on the new stroke complicated by aphasia, dysphagia-therefore prognosis is poor.  If patient continues to decline may be a candidate for hospice.  If stabilized,  entertaining the possibility of PEG tube placement   Final plan/disposition: NG with tube placement for tube feed now, followed by PEG tube placement on Monday, 01/20/2024 with IR at [facility],  then discharged to SNF with palliative care   ------------------------------------------------------------------------------------------------------------------------------ Nutritional status:  The patient's BMI is: -------------------------------------------------------------------------------------------------------------------------------  DVT prophylaxis:  enoxaparin  (LOVENOX ) injection 40 mg Start: 01/14/24 1930   Code Status:   Code Status: Limited: Do not attempt resuscitation (DNR) -DNR-LIMITED -Do Not Intubate/DNI   Family Communication: No family member present at bedside-  -Advance care planning has been  discussed.   Admission status:   Status is: Inpatient Remains inpatient appropriate because: Continue need for progressive recurrent stroke, stroke workup   Disposition: From  - home             Planning for discharge in 2 days SNF with palliative care  Procedures:   No admission procedures for hospital encounter.   Antimicrobials:  Anti-infectives (From admission, onward)    None        Medication:   atorvastatin   40 mg Oral Daily   Chlorhexidine  Gluconate Cloth  6 each Topical Q0600   cloNIDine  0.1 mg Transdermal Weekly   clopidogrel   75 mg Oral Daily   enoxaparin  (LOVENOX ) injection  40 mg Subcutaneous Q24H   insulin  aspart  0-9 Units Subcutaneous Q6H   levothyroxine   100 mcg Oral Q0600    acetaminophen  **OR** acetaminophen  (TYLENOL ) oral liquid 160 mg/5 mL **OR** acetaminophen , hydrALAZINE , HYDROmorphone  (DILAUDID ) injection, labetalol , prochlorperazine , senna-docusate   Objective:   Vitals:   01/17/24 1100 01/17/24 1126 01/17/24 1130 01/17/24 1230  BP: (!) 235/124  (!) 213/124 (!) 201/118  Pulse: 82  78 82  Resp: 15  18 12   Temp:  98 F (36.7 C)    TempSrc:  Axillary    SpO2: 100%  99% 100%  Weight:      Height:        Intake/Output Summary (Last 24 hours) at 01/17/2024 1338 Last data  filed at 01/17/2024 0636 Gross per 24 hour  Intake 771.25 ml  Output 350 ml  Net 421.25 ml   Filed Weights   01/14/24 1635  Weight: 83.1 kg     Physical examination:   General:  More awake, still nonverbal, following some commands  HEENT:  Normocephalic, PERRL, otherwise with in Normal limits   Neuro:  Nonverbal More awake remains aphasic, tracking with her eyes, following some commands CNII-XII intact.,  Dense right-sided weakness, with right upper extremity contraction Moving left upper and lower extremity spontaneously with no purpose  Lungs:   Clear to auscultation BL, Respirations unlabored,  No wheezes / crackles  Cardio:    S1/S2, RRR, No murmure, No  Rubs or Gallops   Abdomen:  Soft, non-tender, bowel sounds active all four quadrants, no guarding or peritoneal signs.  Muscular  skeletal:  Limited exam -dense right-sided weakness, limited movement on the left upper and lower extremities -global generalized weaknesses - in bed, able to move left upper/lower extremities,   2+ pulses,  symmetric, No pitting edema  Skin:  Dry, warm to touch, negative for any Rashes,  Wounds: Please see nursing documentation          -----------------------------------------------------------------------------------------------------------------------------    LABs:     Latest Ref Rng & Units 01/14/2024    1:34 PM 01/14/2024    1:31 PM 08/27/2020    4:34 AM  CBC  WBC 4.0 - 10.5 K/uL  7.8  6.6   Hemoglobin 12.0 - 15.0 g/dL 80.9  80.5  87.2   Hematocrit 36.0 - 46.0 % 56.0  55.9  39.8   Platelets 150 - 400 K/uL  216  192       Latest Ref Rng & Units 01/17/2024    5:27 AM 01/16/2024    4:52 AM 01/14/2024    1:34 PM  CMP  Glucose 70 - 99 mg/dL 857  866  829   BUN 6 - 20 mg/dL 12  13  10    Creatinine 0.44 - 1.00 mg/dL 9.09  8.75  8.99   Sodium 135 - 145 mmol/L 143  143  140   Potassium 3.5 - 5.1 mmol/L 3.4  3.5  3.2   Chloride 98 - 111 mmol/L 104  103  99   CO2 22 - 32 mmol/L 24  25    Calcium  8.9 - 10.3 mg/dL 9.8  89.8         Micro Results Recent Results (from the past 240 hours)  MRSA Next Gen by PCR, Nasal     Status: None   Collection Time: 01/14/24  4:32 PM   Specimen: Nasal Mucosa; Nasal Swab  Result Value Ref Range Status   MRSA by PCR Next Gen NOT DETECTED NOT DETECTED Final    Comment: (NOTE) The GeneXpert MRSA Assay (FDA approved for NASAL specimens only), is one component of a comprehensive MRSA colonization surveillance program. It is not intended to diagnose MRSA infection nor to guide or monitor treatment for MRSA infections. Test performance is not FDA approved in patients less than 64 years old. Performed at [facility], [address], [place], [place] [address]     Radiology Reports CT ABDOMEN WO CONTRAST Result Date: 01/17/2024 CLINICAL DATA:  Evaluate anatomy for gastrostomy tube placement. EXAM: CT ABDOMEN WITHOUT CONTRAST TECHNIQUE: Multidetector CT imaging of the abdomen was performed following the standard protocol without IV contrast. RADIATION DOSE REDUCTION: This exam was performed according to the departmental dose-optimization program which includes automated exposure  control, adjustment of the mA and/or kV according to patient size and/or use of iterative reconstruction technique. COMPARISON:  CT 06/27/2020 FINDINGS: Lower chest: Volume loss in left lower lobe. Hepatobiliary: Mild distension in the gallbladder. Normal appearance of the liver. Pancreas: Unremarkable. No pancreatic ductal dilatation or surrounding inflammatory changes. Spleen: Normal in size without focal abnormality. Adrenals/Urinary Tract: Normal adrenal glands. High-density material in the right renal pelvis compatible with recent intravascular contrast administration. No hydronephrosis. Stomach/Bowel: Normal appearance of the stomach. There are no structures interposed between the stomach and the anterior abdominal wall. Anatomy is amendable for percutaneous gastrostomy tube placement. Moderate amount of stool in the visualized colon. No evidence for bowel dilatation or obstruction. Vascular/Lymphatic: Atherosclerotic disease in the abdominal aorta without aneurysm. No significant lymph node enlargement in the abdomen. Other: Negative for ascites. Musculoskeletal: Progressive endplate disease involving the superior endplate L5 with a Schmorl's node. Disc space narrowing at L5-S1. IMPRESSION: 1. No structures interposed between the stomach and the anterior abdominal wall. Anatomy is amendable for percutaneous gastrostomy tube placement. 2. No acute abnormality in the abdomen. 3. Aortic Atherosclerosis (ICD10-I70.0). Electronically Signed   By:  Juliene Balder M.D.   On: 01/17/2024 08:36     SIGNED: Adriana DELENA Grams, MD, FHM. FAAFP. [facility] - [facility] Critical care time spent - 55 min.  In seeing, evaluating and examining the patient. Reviewing medical records, labs, drawn plan of care. [facility],  Pager (please use [email or web address] to page/ text) Please use Epic Secure Chat for non-urgent communication (7AM-7PM)  If 7PM-7AM, please contact night-coverage [email or web address], 01/17/2024, 1:38 PM

## 2024-01-17 NOTE — Progress Notes (Addendum)
 Nutrition Follow-up  DOCUMENTATION CODES:   Obesity unspecified  INTERVENTION:   -TF via NGT   Initiate Osmolite 1.5 @ 25 ml/hr and increase by 10 ml every 12 hours to goal rate of 45 ml/hr.    60 ml Prosource TF20 daily   Tube feeding regimen provides 1700 kcal (100% of needs), 88 grams of protein, and 823 ml of H2O.     -Monitor Mg, K, and Phos and replete as needed secondary to high refeeding risk -MVI with minerals daily via tube -100 mg thiamine daily x 7 days   NUTRITION DIAGNOSIS:   Inadequate oral intake related to inability to eat as evidenced by NPO status.  Ongoing  GOAL:   Patient will meet greater than or equal to 90% of their needs  Unmet  MONITOR:   Diet advancement  REASON FOR ASSESSMENT:   Malnutrition Screening Tool    ASSESSMENT:   Pt with medical history significant for stroke with baseline right hemiparesis, diabetes mellitus, CHF, hypertension, hypothyroidism. Pt admitted with with reports of unresponsiveness, right gaze, and aphasia.  10/1- s/p BSE- NPO 10/2- s/p BSE- NPO 10/3- s/p BSE- NPO, NGT placed via fluoroscopy  Reviewed I/O's: +421 ml x 24 hours and +1.6 L since admission  UOP: 350 ml x 24 hours  Case discussed in interdisciplinary rounds. Goals of care discussions continue. Palliative care reach out to IR at [facility]; next availability to place PEG is on Monday or Tuesday of next week. Staff will attempt to place NGT in the meantime.   NGT placed via fluoroscopy and RD consult to start TF. Orders placed and RD communicated with RN regarding feeding orders.   Medications reviewed and include lovenox  and dextrose 5% lactated ringers  with potassium chloride  infusion @ 40 ml/hr.   Labs reviewed: K: 3.4, CBGS: 120-143 (inpatient orders for glycemic control are 0-9 units insulin  aspart every 6 hours).    Diet Order:   Diet Order             Diet NPO time specified  Diet effective now                   EDUCATION  NEEDS:   No education needs have been identified at this time  Skin:  Skin Assessment: Reviewed RN Assessment  Last BM:  01/16/24 (type 6)  Height:   Ht Readings from Last 1 Encounters:  01/14/24 5' 5 (1.651 m)    Weight:   Wt Readings from Last 1 Encounters:  01/14/24 83.1 kg    Ideal Body Weight:  56.8 kg  BMI:  Body mass index is 30.49 kg/m.  Estimated Nutritional Needs:   Kcal:  1700-1900  Protein:  85-100 grams  Fluid:  1.7-1.9 L    Margery ORN, RD, LDN, CDCES Registered Dietitian III Certified Diabetes Care and Education Specialist If unable to reach this RD, please use RD Inpatient group chat on secure chat between hours of 8am-4 pm daily

## 2024-01-17 NOTE — Consult Note (Signed)
 Chief Complaint: Patient was seen in consultation today for acute CVA, dysphagia  Referring Physician(s): Dr. Willette  Supervising Physician: Philip Cornet  Patient Status: [facility]  History of Present Illness: Connie Shepard is a 58 y.o. female  with medical history significant for stroke with baseline right hemiparesis, diabetes mellitus, CHF, hypertension, hypothyroidism. Patient was brought to the ED via EMS with reports of unresponsiveness, right gaze, and aphasia. Found to have an acute stroke involving the right corona radiata, caudate, lentiform nucleus, and external capsule.  She now has dysphagia with inability to meet nutrition needs PO.  IR consulted for placement of a gastrostomy tube.  Of note, surgical evaluation was considered, however due to history of HFrEF and need of anesthesia with surgical placement, this was felt to be significant risk.   Case reviewed and approved by Dr. Philip who has determined she is a candidate in IR by CT.  She is currently on DAPT in the setting of an acute stroke and cannot interruption or 5-day washout period.  Placement would be done with accepted risk.   APP to bedside to discuss with family who state they would like to give more time for recovery before changing care plan.  She is a DNR-limited which according to family would remain in effect intra-procedure.   Past Medical History:  Diagnosis Date   Acute heart failure (HCC)    ADD (attention deficit disorder)    Bronchitis    Depression    Diabetes mellitus without complication (HCC)    Elevated troponin    Hypertension    Obesity     Past Surgical History:  Procedure Laterality Date   CESAREAN SECTION      Allergies: Hydroxyzine   Medications: Prior to Admission medications   Medication Sig Start Date End Date Taking? Authorizing Provider  aspirin  EC 81 MG EC tablet Take 1 tablet (81 mg total) by mouth daily with breakfast. Swallow whole. Patient not taking:  Reported on 01/14/2024 08/29/20   Willette Adriana LABOR, MD  atorvastatin  (LIPITOR) 40 MG tablet Take 40 mg by mouth daily. Patient not taking: Reported on 01/14/2024    [provider]  buPROPion  (WELLBUTRIN  SR) 150 MG 12 hr tablet Take 150 mg by mouth daily. Patient not taking: Reported on 01/14/2024    [provider]  buPROPion  (WELLBUTRIN ) 75 MG tablet Take 75 mg by mouth every morning. Patient not taking: Reported on 01/14/2024    [provider]  carvedilol  (COREG ) 12.5 MG tablet Take 1 tablet (12.5 mg total) by mouth 2 (two) times daily with a meal. Patient not taking: Reported on 01/14/2024 03/14/20   Ricky Fines, MD  clopidogrel  (PLAVIX ) 75 MG tablet Take 75 mg by mouth daily. Patient not taking: Reported on 01/14/2024    [provider]  escitalopram  (LEXAPRO ) 20 MG tablet Take 1 tablet (20 mg total) by mouth daily. Patient not taking: Reported on 01/14/2024 12/17/18   Joshua Clayborne RAMAN, PA-C  insulin  aspart (NOVOLOG ) 100 UNIT/ML injection Inject 0-20 Units into the skin 3 (three) times daily with meals. Patient not taking: Reported on 01/14/2024 08/28/20   Willette Adriana LABOR, MD  levothyroxine  (SYNTHROID ) 100 MCG tablet Take 1 tablet (100 mcg total) by mouth daily at 6 (six) AM. Patient not taking: Reported on 01/14/2024 08/29/20 09/28/20  Willette Adriana LABOR, MD  losartan  (COZAAR ) 50 MG tablet Take 50 mg by mouth daily. Patient not taking: Reported on 01/14/2024 01/13/24   [provider]  metFORMIN  (GLUCOPHAGE ) 500 MG tablet  Take 1 tablet (500 mg total) by mouth daily with breakfast. Patient not taking: Reported on 01/14/2024 12/17/18   Joshua Clayborne RAMAN, PA-C  ondansetron  (ZOFRAN ) 4 MG tablet Take 4 mg by mouth every 8 (eight) hours as needed. Patient not taking: Reported on 01/14/2024    [provider]  potassium chloride  SA (KLOR-CON ) 20 MEQ tablet Take 1 tablet (20 mEq total) by mouth 2 (two) times daily. Patient not taking: Reported on 01/14/2024  06/28/20   Raford Lenis, MD  spironolactone  (ALDACTONE ) 25 MG tablet Take 1 tablet (25 mg total) by mouth daily. Patient not taking: Reported on 01/14/2024 12/17/18   Joshua Clayborne RAMAN, PA-C  torsemide  (DEMADEX ) 20 MG tablet Take 2 tablets (40 mg total) by mouth 2 (two) times daily. Patient not taking: Reported on 01/14/2024 03/14/20   Ricky Fines, MD     Family History  Problem Relation Age of Onset   Kidney Stones Mother    Hypertension Mother    Heart disease Father     Social History   Socioeconomic History   Marital status: Legally Separated    Spouse name: Not on file   Number of children: 2   Years of education: Not on file   Highest education level: Not on file  Occupational History   Not on file  Tobacco Use   Smoking status: Never   Smokeless tobacco: Never  Vaping Use   Vaping status: Never Used  Substance and Sexual Activity   Alcohol use: No   Drug use: No   Sexual activity: Not Currently  Other Topics Concern   Not on file  Social History Narrative   Not on file   Social Drivers of Health   Financial Resource Strain: Low Risk  (05/07/2023)   Received from [facility]   Overall Financial Resource Strain (CARDIA)    Difficulty of Paying Living Expenses: Not hard at all  Food Insecurity: No Food Insecurity (01/14/2024)   Hunger Vital Sign    Worried About Running Out of Food in the Last Year: Never true    Ran Out of Food in the Last Year: Never true  Transportation Needs: No Transportation Needs (01/14/2024)   PRAPARE - Administrator, Civil Service (Medical): No    Lack of Transportation (Non-Medical): No  Physical Activity: Unknown (05/07/2023)   Received from [facility]   Exercise Vital Sign    On average, how many days per week do you engage in moderate to strenuous exercise (like a brisk walk)?: 0 days    Minutes of Exercise per Session: Not on file  Stress: No Stress Concern Present (05/07/2023)   Received from [facility] - Occupational Stress Questionnaire    Feeling of Stress : Not at all  Social Connections: Somewhat Isolated (05/07/2023)   Received from [facility]   Social Network    How would you rate your social network (family, work, friends)?: Restricted participation with some degree of social isolation     Review of Systems: A 12 point ROS discussed and pertinent positives are indicated in the HPI above.  All other systems are negative.  Review of Systems  Unable to perform ROS: Acuity of condition    Vital Signs: BP (!) 222/110   Pulse 87   Temp 98 F (36.7 C) (Axillary)   Resp 14   Ht 5' 5 (1.651 m)   Wt 183 lb 3.2 oz (83.1 kg)   SpO2  98%   BMI 30.49 kg/m   Physical Exam Vitals and nursing note reviewed.  Constitutional:      General: She is not in acute distress.    Appearance: She is not ill-appearing.  HENT:     Nose:     Comments: NGT in place Cardiovascular:     Rate and Rhythm: Normal rate.  Pulmonary:     Effort: Pulmonary effort is normal.  Neurological:     General: No focal deficit present.     Mental Status: She is alert and oriented to person, place, and time.  Psychiatric:        Mood and Affect: Mood normal.        Behavior: Behavior normal.        Thought Content: Thought content normal.        Judgment: Judgment normal.      MD Evaluation Airway: WNL Heart: WNL Abdomen: WNL Chest/ Lungs: WNL ASA  Classification: 3 Mallampati/Airway Score: Two   Imaging: DG Naso G Tube Plc W/Fl W/Rad Result Date: 01/17/2024 INDICATION: Dysphagia secondary to acute stroke.  Dobbhoff placement requested. EXAM: NASO G TUBE PLACEMENT WITH FL AND WITH RAD FLUOROSCOPY TIME:  Radiation Exposure Index (as provided by the fluoroscopic device): 4.6 mGy Kerma COMPLICATIONS: None immediate PROCEDURE: The Dobbhoff tube was lubricated with viscous lidocaine  inserted into the right nostril. Under intermittent fluoroscopic guidance, the  Dobbhoff tube was advanced through the stomach with tip in the stomach body. 20 mL injection of contrast confirms position a spot fluoroscopic image was saved for documentation purposes. The tube was affixed to the patient's nose with tape. The patient tolerated the procedure well without immediate postprocedural complication. IMPRESSION: Successful fluoroscopic guided placement of Dobbhoff tube with tip terminating in the body of the stomach. The tube is ready for immediate use. This exam was performed by Solmon Ku PA-C, and was supervised and interpreted by Wilkie Lent, MD. Electronically Signed   By: Wilkie Lent M.D.   On: 01/17/2024 15:55   CT ABDOMEN WO CONTRAST Result Date: 01/17/2024 CLINICAL DATA:  Evaluate anatomy for gastrostomy tube placement. EXAM: CT ABDOMEN WITHOUT CONTRAST TECHNIQUE: Multidetector CT imaging of the abdomen was performed following the standard protocol without IV contrast. RADIATION DOSE REDUCTION: This exam was performed according to the departmental dose-optimization program which includes automated exposure control, adjustment of the mA and/or kV according to patient size and/or use of iterative reconstruction technique. COMPARISON:  CT 06/27/2020 FINDINGS: Lower chest: Volume loss in left lower lobe. Hepatobiliary: Mild distension in the gallbladder. Normal appearance of the liver. Pancreas: Unremarkable. No pancreatic ductal dilatation or surrounding inflammatory changes. Spleen: Normal in size without focal abnormality. Adrenals/Urinary Tract: Normal adrenal glands. High-density material in the right renal pelvis compatible with recent intravascular contrast administration. No hydronephrosis. Stomach/Bowel: Normal appearance of the stomach. There are no structures interposed between the stomach and the anterior abdominal wall. Anatomy is amendable for percutaneous gastrostomy tube placement. Moderate amount of stool in the visualized colon. No evidence for  bowel dilatation or obstruction. Vascular/Lymphatic: Atherosclerotic disease in the abdominal aorta without aneurysm. No significant lymph node enlargement in the abdomen. Other: Negative for ascites. Musculoskeletal: Progressive endplate disease involving the superior endplate L5 with a Schmorl's node. Disc space narrowing at L5-S1. IMPRESSION: 1. No structures interposed between the stomach and the anterior abdominal wall. Anatomy is amendable for percutaneous gastrostomy tube placement. 2. No acute abnormality in the abdomen. 3. Aortic Atherosclerosis (ICD10-I70.0). Electronically Signed   By: Juliene Balder  M.D.   On: 01/17/2024 08:36   ECHOCARDIOGRAM COMPLETE Result Date: 01/15/2024    ECHOCARDIOGRAM REPORT   Patient Name:   TRAYCE CARAVELLO Date of Exam: 01/15/2024 Medical Rec #:  [id number]    Height:       65.0 in Accession #:    [id number]   Weight:       183.2 lb Date of Birth:  1966/02/15   BSA:          1.906 m Patient Age:    57 years     BP:           182/123 mmHg Patient Gender: F            HR:           88 bpm. Exam Location:  [facility] Procedure: 2D Echo, Cardiac Doppler, Color Doppler and Intracardiac            Opacification Agent (Both Spectral and Color Flow Doppler were            utilized during procedure). Indications:    Stroke  History:        Patient has prior history of Echocardiogram examinations, most                 recent 08/25/2020. CHF, Stroke; Risk Factors:Hypertension and                 Diabetes.  Sonographer:    Ellouise Mose RDCS Referring Phys: [id number] TULLY BRAVO [facility]  Sonographer Comments: Technically difficult study due to poor echo windows and patient is obese. Image acquisition challenging due to patient body habitus. IMPRESSIONS  1. Left ventricular ejection fraction, by estimation, is 25%. The left ventricle has severely decreased function. 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 No LV thrombus. There is severe concentric left ventricular hypertrophy of the septal segment. Left ventricular diastolic parameters are indeterminate.  2. Right ventricular systolic function is mildly reduced. The right ventricular size is normal. Tricuspid regurgitation signal is inadequate for assessing PA pressure.  3. The mitral valve is grossly normal. No evidence of mitral valve regurgitation. No evidence of mitral stenosis.  4. The aortic valve was not well visualized. Aortic valve regurgitation is not visualized. No aortic stenosis is present.  5. The inferior vena cava is normal in size with greater than 50% respiratory variability, suggesting right atrial pressure of 3 mmHg. FINDINGS  Left Ventricle: Left ventricular ejection fraction, by estimation, is 25%. The left ventricle has severely decreased function. The left ventricle demonstrates regional wall motion abnormalities. Definity  contrast agent was given IV to delineate the left  ventricular endocardial borders. Strain was performed and the global longitudinal strain is indeterminate. The left ventricular internal cavity size was normal in size. There is severe concentric left ventricular hypertrophy of the septal segment. Left ventricular diastolic parameters are indeterminate.  LV Wall Scoring: The antero-lateral wall, posterior wall, mid inferoseptal segment, basal anterior segment, basal inferior segment, and basal inferoseptal segment are akinetic. The mid and distal anterior wall, entire anterior septum, entire apex, and mid and distal inferior wall are hypokinetic. Right Ventricle: The right ventricular size is normal. No increase in right ventricular wall thickness. Right ventricular systolic function is mildly reduced. Tricuspid regurgitation signal is inadequate for assessing PA pressure. Left Atrium: Left atrial size was normal in size. Right Atrium: Right  atrial size was normal  in size. Pericardium: There is no evidence of pericardial effusion. Mitral Valve: The mitral valve is grossly normal. No evidence of mitral valve regurgitation. No evidence of mitral valve stenosis. Tricuspid Valve: The tricuspid valve is normal in structure. Tricuspid valve regurgitation is not demonstrated. No evidence of tricuspid stenosis. Aortic Valve: The aortic valve was not well visualized. Aortic valve regurgitation is not visualized. No aortic stenosis is present. Pulmonic Valve: The pulmonic valve was not well visualized. Pulmonic valve regurgitation is not visualized. No evidence of pulmonic stenosis. Aorta: The aortic root and ascending aorta are structurally normal, with no evidence of dilitation. Venous: The inferior vena cava is normal in size with greater than 50% respiratory variability, suggesting right atrial pressure of 3 mmHg. IAS/Shunts: No atrial level shunt detected by color flow Doppler. Additional Comments: 3D was performed not requiring image post processing on an independent workstation and was indeterminate.  LEFT VENTRICLE PLAX 2D LVIDd:         4.00 cm     Diastology LVIDs:         3.80 cm     LV e' medial:   4.79 cm/s LV PW:         2.00 cm     LV E/e' medial: 11.9 LV IVS:        1.60 cm LVOT diam:     2.10 cm LV SV:         44 LV SV Index:   23 LVOT Area:     3.46 cm  LV Volumes (MOD) LV vol d, MOD A2C: 77.5 ml LV vol d, MOD A4C: 97.5 ml LV vol s, MOD A2C: 62.3 ml LV vol s, MOD A4C: 58.7 ml LV SV MOD A2C:     15.2 ml LV SV MOD A4C:     97.5 ml LV SV MOD BP:      25.7 ml RIGHT VENTRICLE            IVC RV S prime:     8.05 cm/s  IVC diam: 1.20 cm TAPSE (M-mode): 1.1 cm LEFT ATRIUM             Index        RIGHT ATRIUM          Index LA diam:        2.30 cm 1.21 cm/m   RA Area:     7.44 cm LA Vol (A2C):   24.1 ml 12.65 ml/m  RA Volume:   10.20 ml 5.35 ml/m LA Vol (A4C):   21.2 ml 11.12 ml/m LA Biplane Vol: 23.8 ml 12.49 ml/m  AORTIC VALVE LVOT Vmax:    86.50 cm/s LVOT Vmean:  51.400 cm/s LVOT VTI:    0.126 m  AORTA Ao Root diam: 3.00 cm Ao Asc diam:  3.60 cm MITRAL VALVE MV Area (PHT): 5.38 cm    SHUNTS MV Decel Time: 141 msec    Systemic VTI:  0.13 m MV E velocity: 57.10 cm/s  Systemic Diam: 2.10 cm MV A velocity: 74.40 cm/s MV E/A ratio:  0.77 Vishnu Priya Mallipeddi Electronically signed by Diannah Late Mallipeddi Signature Date/Time: 01/15/2024/2:09:36 PM    Final    MR BRAIN WO CONTRAST Result Date: 01/14/2024 EXAM: MRI BRAIN WITHOUT CONTRAST 01/14/2024 02:27:37 PM TECHNIQUE: Multiplanar multisequence MRI of the head/brain was performed without the administration of intravenous contrast. COMPARISON: Same day CT head and CTA head and neck. CLINICAL HISTORY: Neuro deficit, acute, stroke suspected. Neuro deficit, acute. FINDINGS: BRAIN AND VENTRICLES:  Linear focus of acute infarct extending from the right corona radiata and body of the caudate into the posterior aspect of the lentiform nucleus. Additional small focus of acute infarct within the right external capsule. Extensive chronic microvascular ischemic changes. Generalized parenchymal volume loss. Remote infarcts in the pons. Multiple remote lacunar infarcts in the basal ganglia and thalami. Small remote infarcts in the bilateral cerebellum. Additional small remote lacunar infarcts in the right external capsule. Encephalomalacia in the left occipital lobe suggestive of remote infarct. 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 There are multiple scattered areas of susceptibility in the bilateral cerebral hemispheres with additional areas in the bilateral basal ganglia and thalami and in the left cerebellum. The distribution of chronic microhemorrhages suggests cerebral amyloid angiopathy versus possible component of hypertensive encephalopathy. No intracranial hemorrhage. No mass. No midline shift. No hydrocephalus. The sella is unremarkable.  Normal flow voids. ORBITS: No acute abnormality. SINUSES AND MASTOIDS: No acute abnormality. BONES AND SOFT TISSUES: Normal marrow signal. No acute soft tissue abnormality. IMPRESSION: 1. Acute infarcts in the right corona radiata, caudate, lentiform nucleus, and external capsule. 2. Extensive chronic microvascular ischemic changes, generalized parenchymal volume loss, and multiple remote infarcts. 3. 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 Electronically signed by: Donnice Mania MD 01/14/2024 02:43 PM EDT RP Workstation: [id number]   CT ANGIO HEAD NECK W WO CM (CODE STROKE) Result Date: 01/14/2024 EXAM: CTA Head and Neck with Intravenous Contrast. CT Head without Contrast. CLINICAL HISTORY: Stroke, follow up. Pt BIB ems for CODE STROKE. Per EMS pt was LKN 1230 by caregiver. Pt has hx of previous strokes with rt arm deficits. Pt has rt gaze, rt side facial droop, aphasic. Per EMS pt is normally talking and A \\T \ O times 4. TECHNIQUE: Axial CTA images of the head and neck performed with intravenous contrast. MIP reconstructed images were created and reviewed. Axial computed tomography images of the head/brain performed without intravenous contrast. Note: Per PQRS, the description of internal carotid artery percent stenosis, including 0 percent or normal exam, is based on [place] American Symptomatic Carotid Endarterectomy Trial (NASCET) criteria. Dose reduction technique was used including one or more of the following: automated exposure control, adjustment of mA and kV according to patient size, and/or iterative reconstruction. CONTRAST: Without and with; 75 mL iohexol  (OMNIPAQUE ) 350 MG/ML injection. COMPARISON: CT head dated 01/14/2024 and MRI dated 08/24/2020. FINDINGS: CT HEAD: BRAIN: No acute intraparenchymal hemorrhage. No mass lesion. No CT evidence for acute territorial infarct. No midline shift or  extra-axial collection. VENTRICLES: No hydrocephalus. ORBITS: The orbits are unremarkable. SINUSES AND MASTOIDS: The paranasal sinuses and mastoid air cells are clear. CTA NECK: COMMON CAROTID ARTERIES: There is mild atherosclerosis at the right carotid bifurcation without hemodynamically significant stenosis. No dissection or occlusion. INTERNAL CAROTID ARTERIES: No stenosis by NASCET criteria. No dissection or occlusion. VERTEBRAL ARTERIES: Atherosclerosis involving the V4 segments of both vertebral arteries. There is mild stenosis of the proximal right V4 segment and mild to moderate stenosis of the proximal left V4 segment. No dissection or occlusion. CTA HEAD: ANTERIOR CEREBRAL ARTERIES: The anterior cerebral arteries are patent bilaterally. No significant stenosis. No occlusion. No aneurysm. MIDDLE CEREBRAL ARTERIES: The middle cerebral arteries are patent bilaterally. There is mild atherosclerotic irregularity of multiple M2 branches of the right MCA. Severe stenosis of a proximal M2 branch of the left MCA. Additional moderate stenosis of a distal M2/proximal M3 branch  of the left MCA within the posterior aspect of the left MCA territory. No aneurysm. POSTERIOR CEREBRAL ARTERIES: There is severe stenosis of the P1 segment right PCA with occlusion of the right P2 segment which appears similar to the prior MRI. There are minimal distal branches of the right PCA visualized. Similar severe stenosis of the proximal P2 segment of the left PCA. There is tapering and likely occlusion of the distal P2 segment of the left PCA similar to the prior MRI. Minimal branches of the left PCA visualized. No aneurysm. BASILAR ARTERY: No significant stenosis. No occlusion. No aneurysm. OTHER: The intracranial internal carotid arteries are patent bilaterally. There is atherosclerosis of the carotid siphons. Mild stenosis of the bilateral supraclinoid ICA, slightly greater on the right. SOFT TISSUES: No acute finding. No masses or  lymphadenopathy. BONES: Lucency surrounding an unerupted right mandibular molar. No acute osseous abnormality. IMPRESSION: 1. No evidence of acute large vessel occlusion. 2. 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 Severe stenosis of the proximal P2 segment of the left PCA with tapering and likely occlusion of the distal P2 segment, similar to prior MRA. 3. Severe stenosis of a proximal M2 branch and additional moderate stenosis of a distal M2/proximal M3 branch of the left MCA. 4. Mild atherosclerotic irregularity of multiple M2 branches of the right MCA. Electronically signed by: Donnice Mania MD 01/14/2024 02:17 PM EDT RP Workstation: [id number]   CT HEAD CODE STROKE WO CONTRAST Result Date: 01/14/2024 CLINICAL DATA:  Code stroke. Neuro deficit, acute, stroke suspected. EXAM: CT HEAD WITHOUT CONTRAST TECHNIQUE: Contiguous axial images were obtained from the base of the skull through the vertex without intravenous contrast. RADIATION DOSE REDUCTION: This exam was performed according to the departmental dose-optimization program which includes automated exposure control, adjustment of the mA and/or kV according to patient size and/or use of iterative reconstruction technique. COMPARISON:  Brain MRI 08/24/2020.  Head CT 08/24/2020. FINDINGS: Brain: Generalized cerebral and cerebellar atrophy. 17 mm infarct within the thalamus and posterior limb of internal capsule on the left, new from the prior head CT of 08/24/2020. Known chronic lacunar infarcts elsewhere within the genu and posterior limb of left internal capsule, and within the right thalamus. Redemonstrated chronic cortical/subcortical infarct within the right occipital lobe. Chronic cortical/subcortical infarct within the left occipital lobe, new from the prior head CT. Background moderate patchy and ill-defined hypoattenuation within the cerebral white matter, nonspecific but compatible  chronic small vessel ischemic disease. There is no acute intracranial hemorrhage. No extra-axial fluid collection. No evidence of an intracranial mass. No midline shift. Vascular: No hyperdense vessel.  Atherosclerotic calcifications. Skull: No calvarial fracture or aggressive osseous lesion. Sinuses/Orbits: No mass or acute finding within the imaged orbits. No significant paranasal sinus disease at the imaged levels. ASPECTS [facility] Stroke Program Early CT Score) - Ganglionic level infarction (caudate, lentiform nuclei, internal capsule, insula, M1-M3 cortex): 6 - Supraganglionic infarction (M4-M6 cortex): 3 Total score (0-10 with 10 being normal): 9 (when discounting chronic infarcts). Impression #1 communicated to Dr. Voncile at 2:00 pmon 9/30/2025by text page via the [facility] messaging system. IMPRESSION: 1. 17 mm infarct within the thalamus and posterior limb of internal capsule on the left, new from prior exams and potentially acute. Consider a brain MRI for further evaluation. 2. Background parenchymal atrophy, chronic small vessel ischemic disease and chronic infarcts, as described. Electronically Signed   By: Rockey Childs D.O.   On: 01/14/2024 14:01  Labs:  CBC: Recent Labs    01/14/24 1331 01/14/24 1334  WBC 7.8  --   HGB 19.4* 19.0*  HCT 55.9* 56.0*  PLT 216  --     COAGS: Recent Labs    01/14/24 1331  INR 0.9  APTT 28    BMP: Recent Labs    01/14/24 1331 01/14/24 1334 01/16/24 0452 01/17/24 0527  NA 140 140 143 143  K 3.4* 3.2* 3.5 3.4*  CL 99 99 103 104  CO2 22  --  25 24  GLUCOSE 166* 170* 133* 142*  BUN 9 10 13 12   CALCIUM  10.1  --  10.1 9.8  CREATININE 0.96 1.00 1.24* 0.90  GFRNONAA >60  --  51* >60    LIVER FUNCTION TESTS: Recent Labs    01/14/24 1331  BILITOT 0.9  AST 22  ALT 25  ALKPHOS 121  PROT 8.1  ALBUMIN 4.3    TUMOR MARKERS: No results for input(s): AFPTM, CEA, CA199, CHROMGRNA in the last 8760 hours.  Assessment and Plan: Acute  CVA with dysphagia Connie Shepard is a 58 year old female with history of acute stroke and residual right-sided deficit.  She is admitted with second stroke and compounding deficits affecting her ability to vocalize, eat, and drink among other things.  IR consulted for gastrostomy tube placement.  She is currently on DAPT and should remain on asa/Plavix  for the foreseeable future without interruption.  She also has persistent elevation in her blood pressure.   Case reviewed by Dr. Philip who has approved patient for percutaneous placement based on anatomy.   NGT placed this afternoon in radiology to temporize until schedule amenable for patient to be transported to [facility] for gastrostomy tube placement.  Hopefully in the meantime her BP will respond to medical management.  Spoke with family at bedside. They are aware of risk of placement while on DAPT and are accepting of the risk given her need for DAPT after recent stroke. They are also aware tube would remain in place for 6-8 weeks irregardless of any functional recovery.  They are agreeable to proceed.    Thank you for this interesting consult.  I greatly enjoyed meeting Thersa Mohiuddin and look forward to participating in their care.  A copy of this report was sent to the requesting provider on this date.  Electronically Signed: Collier Monica Sue-Ellen Alee Gressman, PA 01/17/2024, 4:45 PM   I spent a total of 40 Minutes    in face to face in clinical consultation, greater than 50% of which was counseling/coordinating care for acute CVA.

## 2024-01-17 NOTE — Plan of Care (Signed)
  Problem: Clinical Measurements: Goal: Respiratory complications will improve Outcome: Progressing   Problem: Metabolic: Goal: Ability to maintain appropriate glucose levels will improve Outcome: Progressing

## 2024-01-17 NOTE — Progress Notes (Signed)
 Speech Language Pathology Treatment: Dysphagia  Patient Details Name: Connie Shepard MRN: [id number] DOB: 1965-07-01 Today's Date: 01/17/2024 Time: 1040-1105 SLP Time Calculation (min) (ACUTE ONLY): 25 min  Assessment / Plan / Recommendation Clinical Impression  Pt seen for f/u dysphagia tx after pt participated in oral care completion with L hand given mod verbal/auditory cues by SLP.  Pt unable to follow oral commands consistently during tx session for opening mouth/protruding tongue, so oral care was limited.  Pt consumed single ice chips without anterior loss as seen in previous sessions, but oral retention/holding was present and decreased oral manipulation prior to delayed initiation of the swallow per attempt.  Pt unable to achieve a cough, but she did clear her throat after a slight delay in processing this information.  Pt eager to consume ice chips and nodded her head yes when asked for further po trials.  Pt may benefit from a MBS to determine baseline functioning, but recommendation continues to be NPO with ice chips allowed after extensive oral care.  ST will continue to f/u for po readiness and objective assessment completion.   HPI HPI: Connie Shepard is a 58 y.o. female with medical history significant for stroke with baseline right hemiparesis, diabetes mellitus, CHF, hypertension, hypothyroidism. Patient was brought to the ED via EMS with reports of unresponsiveness, right gaze, and aphasia. Family reported that patient was last known well at about 8 PM last night when she went to bed. This morning when she woke up she is not feeling well and was not talking much at all, it was about 12:30 PM after she woke up from a nap and I did notice that she was not talking at all. EMS noted that she was gazing to the right on the following directions. BSE recommendations for NPO.  ST f/u for speech/language evaluation/readiness and dysphagia tx.      SLP Plan  Continue with current plan of  care  Patient needs continued Speech Language Pathology Services       Recommendations  Diet recommendations: NPO (ice chips okay after oral care) Medication Administration: Via alternative means                  Oral care prior to ice chip/H20;Oral care QID   Frequent or constant Supervision/Assistance Dysphagia, oropharyngeal phase (R13.12);Cognitive communication deficit (R41.841)     Continue with current plan of care     Pat Connie Shepard,M.S.,CCC-SLP  01/17/2024, 12:41 PM

## 2024-01-17 NOTE — Evaluation (Addendum)
 Speech Language Pathology Evaluation Patient Details Name: Connie Shepard MRN: [id number] DOB: Oct 10, 1965 Today's Date: 01/17/2024 Time: 1040-1105 SLP Time Calculation (min) (ACUTE ONLY): 25 min  Problem List:  Patient Active Problem List   Diagnosis Date Noted   Chronic combined systolic and diastolic congestive heart failure (HCC) 08/25/2020   Acute CVA (cerebrovascular accident) (HCC) 08/24/2020   Borderline prolonged QT interval 08/24/2020   Type 2 diabetes mellitus with complication, without long-term current use of insulin  (HCC) 03/16/2020   Anasarca    Diabetes mellitus (HCC)    Hypokalemia    Vitamin D  deficiency    Hypothyroidism    Physical deconditioning    Acute on chronic systolic CHF (congestive heart failure) (HCC) 03/09/2020   Acute on chronic systolic HF (heart failure) (HCC) 03/09/2020   Hyperglycemia 12/22/2018   Hypertensive crisis 04/04/2018   Acute CHF (congestive heart failure) (HCC) 04/04/2018   Mild renal insufficiency 04/04/2018   Elevated troponin 04/04/2018   Bronchitis    Obesity    Seborrheic dermatitis of scalp 05/23/2016   Essential hypertension 12/02/2015   Depression 12/02/2015   Primary insomnia 12/02/2015   Morbid obesity with body mass index of 50.0-59.9 in adult [facility]) 12/02/2015   Attention deficit hyperactivity disorder (ADHD), predominantly inattentive type 12/02/2015   Past Medical History:  Past Medical History:  Diagnosis Date   Acute heart failure (HCC)    ADD (attention deficit disorder)    Bronchitis    Depression    Diabetes mellitus without complication (HCC)    Elevated troponin    Hypertension    Obesity    Past Surgical History:  Past Surgical History:  Procedure Laterality Date   CESAREAN SECTION     HPI:  Connie Shepard is a 58 y.o. female with medical history significant for stroke with baseline right hemiparesis, diabetes mellitus, CHF, hypertension, hypothyroidism. Patient was brought to the ED via EMS with  reports of unresponsiveness, right gaze, and aphasia. Family reported that patient was last known well at about 8 PM last night when she went to bed. This morning when she woke up she is not feeling well and was not talking much at all, it was about 12:30 PM after she woke up from a nap and I did notice that she was not talking at all. EMS noted that she was gazing to the right on the following directions. BSE recommendations for NPO.  ST f/u for speech/language evaluation/readiness and dysphagia tx.   Assessment / Plan / Recommendation Clinical Impression  Pt seen for speech/language cognitive assessment with expressive/receptive aphasia present and question oral and verbal apraxia.  OME unable to be fully completed d/t pt being unable to follow oral commands.  She did hold swab/toothbrush and brushed her teeth/manipulated with L hand during evaluation with mod verbal/auditory cues from SLP.  Pt primarily communicating via gesturing with head nod/shake and/or gesturing with L hand as prior R paresis noted from CVA in 2022 per family report.  Pt able to track parents/SLP in room and gestured thumbs up/down, pointed to various items in room when asked with 80% accuracy, but overall auditory processing/execution of commands is impaired.  Patient is nonverbal and communicates primarily via gestures.  A communication board was not available to attempt during evaluation, but this may be beneficial for pt to communicate via gesturing.  Pt answered yes/no questions re: personal/biographical information with 70% accuracy; 1-step commands followed with 100% accuracy given extended time, 2 step commands yielded 50% accuracy and multi-step commands were not  assessed.  Recommend ST f/u for speech/language tx and ongoing cognitive assessment paired with dysphagia tx during acute stay.  Thank you for this consult.    SLP Assessment  SLP Recommendation/Assessment: Patient needs continued Speech Language Pathology  Services SLP Visit Diagnosis: Dysphagia, oropharyngeal phase (R13.12);Cognitive communication deficit (R41.841)     Assistance Recommended at Discharge  Frequent or constant Supervision/Assistance  Functional Status Assessment Patient has had a recent decline in their functional status and demonstrates the ability to make significant improvements in function in a reasonable and predictable amount of time.  Frequency and Duration min 2x/week  1 week      SLP Evaluation Cognition  Overall Cognitive Status: Difficult to assess Arousal/Alertness: Awake/alert Orientation Level: Oriented to person;Oriented to place;Disoriented to time;Disoriented to situation (Nods appropriately.) Comments: DTA cognition       Comprehension  Auditory Comprehension Overall Auditory Comprehension: Impaired at baseline Yes/No Questions: Impaired Basic Biographical Questions: 51-75% accurate Basic Immediate Environment Questions: 50-74% accurate Commands: Impaired Two Step Basic Commands: 50-74% accurate Conversation: Other (comment) (pt unable to verbalize) Interfering Components: Processing speed;Other (comment) (prior level of functioning) EffectiveTechniques: Repetition;Visual/Gestural cues;Extra processing time Visual Recognition/Discrimination Discrimination: Not tested Reading Comprehension Reading Status: Not tested    Expression Expression Primary Mode of Expression: Nonverbal - gestures Verbal Expression Overall Verbal Expression: Impaired at baseline Naming: Not tested Pragmatics: Unable to assess Interfering Components: Premorbid deficit Non-Verbal Means of Communication: Gestures;Other (comment) (working to obtain Public affairs consultant) Programme researcher, broadcasting/film/video Expression: Not tested   Oral / Motor  Oral Motor/Sensory Function Overall Oral Motor/Sensory Function: Other (comment) (DTA; pt unable to follow oral commands; oral manipulation decreased during ice chip  administration) Motor Speech Overall Motor Speech: Impaired at baseline Respiration: Within functional limits Phonation: Other (comment) (DNA) Articulation: Impaired Level of Impairment: Word Intelligibility: Unable to assess (comment) Motor Planning: Not tested Motor Speech Errors: Not applicable Interfering Components: Premorbid status            Pat Kyanna Mahrt,M.S.,CCC-SLP 01/17/2024, 12:31 PM

## 2024-01-17 NOTE — Care Management Important Message (Signed)
 Important Message  Patient Details  Name: Connie Shepard MRN: [id number] Date of Birth: 01-02-1966   Important Message Given:  Yes - Medicare IM     Seymore Brodowski L Janei Scheff 01/17/2024, 12:05 PM

## 2024-01-18 DIAGNOSIS — I639 Cerebral infarction, unspecified: Secondary | ICD-10-CM | POA: Diagnosis not present

## 2024-01-18 LAB — GLUCOSE, CAPILLARY
Glucose-Capillary: 131 mg/dL — ABNORMAL HIGH (ref 70–99)
Glucose-Capillary: 149 mg/dL — ABNORMAL HIGH (ref 70–99)
Glucose-Capillary: 149 mg/dL — ABNORMAL HIGH (ref 70–99)
Glucose-Capillary: 150 mg/dL — ABNORMAL HIGH (ref 70–99)
Glucose-Capillary: 154 mg/dL — ABNORMAL HIGH (ref 70–99)
Glucose-Capillary: 164 mg/dL — ABNORMAL HIGH (ref 70–99)
Glucose-Capillary: 181 mg/dL — ABNORMAL HIGH (ref 70–99)

## 2024-01-18 LAB — BASIC METABOLIC PANEL WITH GFR
Anion gap: 14 (ref 5–15)
BUN: 16 mg/dL (ref 6–20)
CO2: 24 mmol/L (ref 22–32)
Calcium: 9.9 mg/dL (ref 8.9–10.3)
Chloride: 105 mmol/L (ref 98–111)
Creatinine, Ser: 1.03 mg/dL — ABNORMAL HIGH (ref 0.44–1.00)
GFR, Estimated: >60 mL/min (ref >60)
Glucose, Bld: 169 mg/dL — ABNORMAL HIGH (ref 70–99)
Potassium: 3.3 mmol/L — ABNORMAL LOW (ref 3.5–5.1)
Sodium: 143 mmol/L (ref 135–145)

## 2024-01-18 LAB — PHOSPHORUS: Phosphorus: 2.9 mg/dL (ref 2.5–4.6)

## 2024-01-18 LAB — MAGNESIUM: Magnesium: 2 mg/dL (ref 1.7–2.4)

## 2024-01-18 MED ORDER — ATORVASTATIN CALCIUM 40 MG PO TABS
40.0000 mg | ORAL_TABLET | Freq: Every day | ORAL | Status: DC
  Administered 2024-01-22 – 2024-01-29 (×8): 40 mg
  Filled 2024-01-18 (×9): qty 1

## 2024-01-18 MED ORDER — ASPIRIN 81 MG PO CHEW: 81.0000 mg | CHEWABLE_TABLET | Freq: Every day | ORAL | Status: DC

## 2024-01-18 MED ORDER — POTASSIUM CHLORIDE 2 MEQ/ML IV SOLN
INTRAVENOUS | Status: AC
  Filled 2024-01-18 (×2): qty 1000

## 2024-01-18 MED ORDER — ASPIRIN 300 MG RE SUPP
150.0000 mg | Freq: Every day | RECTAL | Status: DC
  Administered 2024-01-18 – 2024-01-23 (×6): 150 mg via RECTAL
  Filled 2024-01-18: qty 1

## 2024-01-18 MED ORDER — AMLODIPINE BESYLATE 5 MG PO TABS
10.0000 mg | ORAL_TABLET | Freq: Every day | ORAL | Status: DC
  Administered 2024-01-22 – 2024-01-29 (×8): 10 mg
  Filled 2024-01-18 (×9): qty 2

## 2024-01-18 MED ORDER — ASPIRIN 81 MG PO CHEW
CHEWABLE_TABLET | ORAL | Status: AC
  Filled 2024-01-18: qty 1

## 2024-01-18 MED ORDER — CLOPIDOGREL BISULFATE 75 MG PO TABS
75.0000 mg | ORAL_TABLET | Freq: Every day | ORAL | Status: DC
  Administered 2024-01-22 – 2024-01-29 (×8): 75 mg
  Filled 2024-01-18 (×9): qty 1

## 2024-01-18 MED ORDER — POTASSIUM CHLORIDE IN NACL 20-0.9 MEQ/L-% IV SOLN: INTRAVENOUS | Status: DC

## 2024-01-18 NOTE — Progress Notes (Signed)
 Per Night shift RN, patient pulled out dobhoff tube. Unable to give any meds per tube at this time. Chewable aspirin  changed to suppository.

## 2024-01-18 NOTE — Progress Notes (Signed)
 PROGRESS NOTE    Patient: Connie Shepard                            PCP: Bobbette Coye LABOR, MD                    DOB: 1965-06-09            DOA: 01/14/2024 FMW:[id number]             DOS: 01/18/2024, 10:06 AM   LOS: 4 days   Date of Service: The patient was seen and examined on 01/18/2024  Subjective:   The patient was seen and examined, more awake, still nonverbal.  Does not follow commands Overnight pulled out her NG tube No bolus of feeding except for IV Hypertensive otherwise hemodynamically stable  Brief Narrative:   Deondria Puryear is a 58 y.o. female with medical history significant for stroke with baseline Right hemiparesis, DM II, CHF, HTN, hypothyroidism. Patient was brought to the ED via EMS with reports of unresponsiveness, right gaze, and aphasia.  Family reported that patient was last known well at about 8 PM last night when she went to bed.  This morning when she woke up she is not feeling well and was not talking much at all, it was about 12:30 PM after she woke up from a nap and I did notice that she was not talking at all.  EMS noted that she was gazing to the right on the following directions. On my evaluation, patient is awake, but gazing to the right and unable to answer questions.   ED Course: BP 219/155.  Heart rate 102-112.  RR 11-22.   Code stroke called.  Patient outside window for thrombolysis.   Head CT-suggested an acute infarct. Subsequent MRI brain - Acute infarcts in the right corona radiata, caudate, lentiform nucleus, and external capsule. Extensive chronic microvascular ischemic changes, generalized parenchymal volume loss, and multiple remote infarcts. CTA head and neck-severe stenosis in several arteries. Tele-neurology was consulted, okay for patient to be admitted here at [facility].     Assessment & Plan:   Principal Problem:   Acute CVA (cerebrovascular accident) [facility]) Active Problems:   Essential hypertension   Diabetes mellitus (HCC)    Hypothyroidism   Chronic combined systolic and diastolic congestive heart failure (HCC)     Assessment and Plan:   Acute CVA-  New Acute infarcts in the right corona radiata, caudate, lentiform nucleus, and external capsule.  -Remains aphasic, with chronic right-sided hemiplegia, with right upper arm contracted Nonverbal, able to move left upper extremity and lower extremity  - Severe dysphagia -status post extensive evaluation by speech-NG tube was placed, patient pulled out this morning 01/18/2024  - Palliative care following closely-extensive ongoing discussion Final decision to place Dobbhoff, with plan to coordinate with IR for placement of PEG tube on Monday, 01/20/2024  -Subsequent plan to discharge to SNF with PEG tube, with close palliative care follow-up and further decline considering hospice  .-------------------------------------------------------------------------------------------------------------------------- Remain nonverbal, unable to swallow, awake   POA: History of stroke, with baseline right hemiparesis  - MRI brain-  Acute infarcts in the right corona radiata, caudate, lentiform nucleus, and external capsule. Extensive chronic microvascular ischemic changes, generalized parenchymal volume loss, and multiple remote infarcts.   - CTA head and neck reviewed stenosis in multiple vessels.  - Per neurology:  aspirin , Plavix , x 3 months then continue with aspirin  325 daily For now continue  with aspirin  and 325 mg rectally- high-dose statin  -but patient is unable to tolerate p.o.,    Prognosis remain poor due to recurrent stroke-severe dysphagia Palliative care consulted-patient's caregiver mom and dad, confirm DNR/ DNI status  - Per speech unable to swallow-made n.p.o.-to determine modes of feeding   - TOC consulted, debating for SNF placement, continue with palliative care, if decline hospice    - Echocardiogram >>>  EF estimated 25%, severe decreased LV  function, finding consistent with Takotsubo cardiomyopathy.  Severe LVH  - PT OT speech therapy eval-SNF unable to swallow, pending PEG tube placement o N.p.o.  -Continue aspirin  rectally -Continue IV fluid for hydration    Hypertension -S/p permissive hypertension -unable tolerate p.o. - Holding home medication: Unable to tolerate p.o. Carvedilol  12.5 mg twice daily, Norvasc 10 mg, losartan  50 mg, spironolactone  and torsemide  -Added clonidine patch, as needed hydralazine , labetalol   Diabetes mellitus -A1c 12/02/2023-8.4.  - SSI- S q6h - Hold home Lantus  while NPO, metformin    Chronic combined systolic and diastolic CHF -stable and compensated.   Last echo 2022 EF 20% with global hypokinesis - Repeating 2D Echo: EF estimated 25%, severe decreased LV function, finding consistent with Takotsubo cardiomyopathy.  Severe LVH - Gentle hydration - Hold torsemide  40 mg twice daily, spironolactone   Ethics: Changed to DNR limited Palliative care consulted, mom and dad present at bedside.  Recurrent stroke, with progressive neurological changes on the new stroke complicated by aphasia, dysphagia-therefore prognosis is poor.  If patient continues to decline may be a candidate for hospice.  If stabilized,  entertaining the possibility of PEG tube placement   Final plan/ Disposition: PEG tube placement on Monday, 01/20/2024 with IR at [facility],  then discharged to SNF with palliative care   ------------------------------------------------------------------------------------------------------------------------------ Nutritional status:  The patient's BMI is: -------------------------------------------------------------------------------------------------------------------------------  DVT prophylaxis:  enoxaparin  (LOVENOX ) injection 40 mg Start: 01/14/24 1930   Code Status:   Code Status: Limited: Do not attempt resuscitation (DNR) -DNR-LIMITED -Do Not Intubate/DNI   Family Communication: No  family member present at bedside-  -Advance care planning has been discussed.   Admission status:   Status is: Inpatient Remains inpatient appropriate because: Continue need for progressive recurrent stroke, stroke workup   Disposition: From  - home             Planning for discharge in 2 days SNF with palliative care  Procedures:   No admission procedures for hospital encounter.   Antimicrobials:  Anti-infectives (From admission, onward)    None        Medication:   amLODipine  10 mg Per Tube Daily   aspirin   150 mg Rectal Daily   atorvastatin   40 mg Per Tube Daily   Chlorhexidine  Gluconate Cloth  6 each Topical Q0600   cloNIDine  0.3 mg Transdermal Weekly   clopidogrel   75 mg Per Tube Daily   enoxaparin  (LOVENOX ) injection  40 mg Subcutaneous Q24H   feeding supplement (PROSource TF20)  60 mL Per Tube Daily   insulin  aspart  0-9 Units Subcutaneous Q6H   levothyroxine   100 mcg Oral Q0600   multivitamin with minerals  1 tablet Per Tube Daily   thiamine  100 mg Per Tube Daily    acetaminophen  **OR** acetaminophen  (TYLENOL ) oral liquid 160 mg/5 mL **OR** acetaminophen , hydrALAZINE , HYDROmorphone  (DILAUDID ) injection, labetalol , prochlorperazine , senna-docusate   Objective:   Vitals:   01/18/24 0744 01/18/24 0800 01/18/24 0900 01/18/24 1000  BP:  (!) 204/121 (!) 216/125 (!) 209/120  Pulse:  98 95 95  Resp:  11 12 15   Temp: 98.1 F (36.7 C)     TempSrc: Axillary     SpO2:  100% 100% 97%  Weight:      Height:        Intake/Output Summary (Last 24 hours) at 01/18/2024 1006 Last data filed at 01/18/2024 0943 Gross per 24 hour  Intake 1532.82 ml  Output 700 ml  Net 832.82 ml   Filed Weights   01/14/24 1635 01/18/24 0452  Weight: 83.1 kg 86 kg     Physical examination:        General:  More awake this morning, moving left upper extremity  HEENT:  Normocephalic, PERRL, otherwise with in Normal limits   Neuro:  Limited exam patient is aphasic,  nonverbal, dense right hemiplegia Moving left upper and lower extremity  Lungs:   Clear to auscultation BL, Respirations unlabored,  No wheezes / crackles  Cardio:    S1/S2, RRR, No murmure, No Rubs or Gallops   Abdomen:  Soft, non-tender, bowel sounds active all four quadrants, no guarding or peritoneal signs.  Muscular  skeletal:  Dense right hemiparesis, moving left upper and lower extremities At baseline bedbound due to previous strokes Limited exam -global generalized weaknesses - in bed, able to move all 4 extremities,   2+ pulses,  symmetric, No pitting edema  Skin:  Dry, warm to touch, negative for any Rashes,  Wounds: Please see nursing documentation              -----------------------------------------------------------------------------------------------------------------------------    LABs:     Latest Ref Rng & Units 01/14/2024    1:34 PM 01/14/2024    1:31 PM 08/27/2020    4:34 AM  CBC  WBC 4.0 - 10.5 K/uL  7.8  6.6   Hemoglobin 12.0 - 15.0 g/dL 80.9  80.5  87.2   Hematocrit 36.0 - 46.0 % 56.0  55.9  39.8   Platelets 150 - 400 K/uL  216  192       Latest Ref Rng & Units 01/18/2024    5:31 AM 01/17/2024    5:27 AM 01/16/2024    4:52 AM  CMP  Glucose 70 - 99 mg/dL 830  857  866   BUN 6 - 20 mg/dL 16  12  13    Creatinine 0.44 - 1.00 mg/dL 8.96  9.09  8.75   Sodium 135 - 145 mmol/L 143  143  143   Potassium 3.5 - 5.1 mmol/L 3.3  3.4  3.5   Chloride 98 - 111 mmol/L 105  104  103   CO2 22 - 32 mmol/L 24  24  25    Calcium  8.9 - 10.3 mg/dL 9.9  9.8  89.8        Micro Results Recent Results (from the past 240 hours)  MRSA Next Gen by PCR, Nasal     Status: None   Collection Time: 01/14/24  4:32 PM   Specimen: Nasal Mucosa; Nasal Swab  Result Value Ref Range Status   MRSA by PCR Next Gen NOT DETECTED NOT DETECTED Final    Comment: (NOTE) The GeneXpert MRSA Assay (FDA approved for NASAL specimens only), is one component of a comprehensive MRSA  colonization surveillance program. It is not intended to diagnose MRSA infection nor to guide or monitor treatment for MRSA infections. Test performance is not FDA approved in patients less than 78 years old. Performed at [facility], [address] [place], [place] [address]  Radiology Reports DG Abd 1 View Result Date: 01/18/2024 CLINICAL DATA:  NG tube EXAM: ABDOMEN - 1 VIEW COMPARISON:  01/17/2024 FINDINGS: Upper gas pattern is unremarkable. No enteric tube is visualized within the included portions of the chest or upper abdomen. IMPRESSION: No enteric tube is visualized within the included portions of the chest or upper abdomen. Electronically Signed   By: Luke Bun M.D.   On: 01/18/2024 00:51   DG [place] MATSU Tube Plc W/Fl W/Rad Result Date: 01/17/2024 INDICATION: Dysphagia secondary to acute stroke.  Dobbhoff placement requested. EXAM: NASO G TUBE PLACEMENT WITH FL AND WITH RAD FLUOROSCOPY TIME:  Radiation Exposure Index (as provided by the fluoroscopic device): 4.6 mGy Kerma COMPLICATIONS: None immediate PROCEDURE: The Dobbhoff tube was lubricated with viscous lidocaine  inserted into the right nostril. Under intermittent fluoroscopic guidance, the Dobbhoff tube was advanced through the stomach with tip in the stomach body. 20 mL injection of contrast confirms position a spot fluoroscopic image was saved for documentation purposes. The tube was affixed to the patient's nose with tape. The patient tolerated the procedure well without immediate postprocedural complication. IMPRESSION: Successful fluoroscopic guided placement of Dobbhoff tube with tip terminating in the body of the stomach. The tube is ready for immediate use. This exam was performed by Solmon Ku PA-C, and was supervised and interpreted by Wilkie Lent, MD. Electronically Signed   By: Wilkie Lent M.D.   On: 01/17/2024 15:55     SIGNED: Adriana DELENA Grams, MD, FHM. FAAFP. Jolynn Pack - [facility] Critical  care time spent - 55 min.  In seeing, evaluating and examining the patient. Reviewing medical records, labs, drawn plan of care. [facility],  Pager (please use [email or web address] to page/ text) Please use Epic Secure Chat for non-urgent communication (7AM-7PM)  If 7PM-7AM, please contact night-coverage [email or web address], 01/18/2024, 10:06 AM

## 2024-01-18 NOTE — Significant Event (Signed)
       CROSS COVER NOTE  NAME: Connie Shepard MRN: [id number] DOB : [birth year] ATTENDING PHYSICIAN: Willette Adriana LABOR, MD    Date of Service   01/18/2024   HPI/Events of Note   Remote cross cover for Connie Shepard - Nurse reports 3 failed attempts to place NGT after the one placed by interventional radiology had been removed  Imaging post last attempt, tube not seen, No complications reported  Interventions   Assessment/Plan: Hold further ngt placement tonight Dayteam to reconsult interventional radiology in am if tube still needed before Monday when PEG scheduled to be placed      Erminio LITTIE Cone NP [facility] Cross Cover 7pm-7am - check amion for availability Pager [phone]

## 2024-01-18 NOTE — Progress Notes (Addendum)
 Speech Language Pathology Treatment: Dysphagia;Cognitive-Linguistic  Patient Details Name: Connie Shepard MRN: [id number] DOB: 1965/12/26 Today's Date: 01/18/2024 Time: 8869-8847 SLP Time Calculation (min) (ACUTE ONLY): 22 min  Assessment / Plan / Recommendation Clinical Impression  Pt seen for dysphagia f/u tx session for po readiness/MBS readiness.  Pt consumed nectar-thickened liquids via 1/2 tsp amount, puree and ice chips with prolonged mastication, oral retention/holding and delay in the initiation of the swallow/audible swallow observed.  Pt did not exhibit any overt s/s of aspiration, but is at risk for aspiration given hx of CVAs and inability to produce a volitional cough/throat clear immediately d/t language processing deficits/aphasia.  Pt grimaced with temperature changes (ie: cold temp) and thickened liquids.  Oral care completed partially as pt would take the swab/toothbrush and attempt to use independently vs allowing SLP to provide oral care.  Pt bit swab with mod verbal cues to release required when lingual surface attempted.  Modification of oral care required d/t pt refusal.  Recommend continue NPO status until objective assessment is able to be completed given pt medical status (BP elevated)/pending PEG (Pt removed NG tube placed last night per chart review).  Ice chips/sips of water allowed after extensive oral care.  Critical medications may be crushed in puree and/or given via alternative means.      Cognitive/linguistic tx consisted of pt gesturing answers/following directives given for functional tasks during dysphagia tx (ie: holding spoon, attempting oral care completion).  Pt identified objects/people in room via gesturing (pointing) with 70% accuracy.   Pt would indicate thumbs up or head nod to continue desired consistency and/or thumbs down or shake head for declining further consistencies.  Pt became emotionally labile when asked to locate her Mother in the room and  stated with decreased intelligibility Love you.  She also stated yea once when asked a question during session, so verbal expression improving during communicative efforts.  ST will continue to f/u for aphasia/dysphagia tx during acute stay.      HPI HPI: Connie Shepard is a 58 y.o. female with medical history significant for stroke with baseline right hemiparesis, diabetes mellitus, CHF, hypertension, hypothyroidism. Patient was brought to the ED via EMS with reports of unresponsiveness, right gaze, and aphasia. Family reported that patient was last known well at about 8 PM last night when she went to bed. This morning when she woke up she is not feeling well and was not talking much at all, it was about 12:30 PM after she woke up from a nap and I did notice that she was not talking at all. EMS noted that she was gazing to the right on the following directions. BSE recommendations for NPO. ST f/u for speech/language and dysphagia tx.  PEG pending.      SLP Plan  Other (Comment);Continue with current plan of care (MBS if medically stable)          Recommendations  Diet recommendations: NPO Medication Administration: Via alternative means (or crushed with puree)                  Staff/trained caregiver to provide oral care;Oral care QID;Oral care prior to ice chip/H20   Frequent or constant Supervision/Assistance Dysphagia, oropharyngeal phase (R13.12);Aphasia (R47.01)     Other (Comment);Continue with current plan of care (MBS if medically stable)     Pat Brad Mcgaughy,M.S.,CCC-SLP  01/18/2024, 4:25 PM

## 2024-01-18 NOTE — Progress Notes (Signed)
 This RN found this pt removed her dobbhoff NG tube at the beginning of the shift. Tried re-inserting 10Fr  NG tube multiple times, to no avail. NP informed and suggested not to re-try for the time being. Currently, pt has pending schedule to go to [facility] on Monday for PEG insertion. MD made aware too. No other orders made at this time.

## 2024-01-18 NOTE — Plan of Care (Signed)
  Problem: Clinical Measurements: Goal: Respiratory complications will improve Outcome: Progressing   Problem: Metabolic: Goal: Ability to maintain appropriate glucose levels will improve Outcome: Progressing

## 2024-01-18 NOTE — Progress Notes (Signed)
 Per speech evaluation today, patient did better with swallowing and thought it might be okay to trial patient with crushed medications in applesauce. Patient did not do well with crushed medications in applesauce. Patient would not open mouth fully for spoon to be placed in mouth. Patient then just held medication mixture in her mouth with the little bit that made it into her mouth. Did not try anymore bites with patient and continuing to keep patient NPO. Medications per tube route for when patient gets PEG tube tentatively scheduled for Monday 10/6. Aspirin  still being given by suppository.

## 2024-01-19 DIAGNOSIS — E039 Hypothyroidism, unspecified: Secondary | ICD-10-CM | POA: Diagnosis not present

## 2024-01-19 DIAGNOSIS — I10 Essential (primary) hypertension: Secondary | ICD-10-CM | POA: Diagnosis not present

## 2024-01-19 DIAGNOSIS — I639 Cerebral infarction, unspecified: Secondary | ICD-10-CM | POA: Diagnosis not present

## 2024-01-19 DIAGNOSIS — E1369 Other specified diabetes mellitus with other specified complication: Secondary | ICD-10-CM | POA: Diagnosis not present

## 2024-01-19 LAB — BASIC METABOLIC PANEL WITH GFR
Anion gap: 13 (ref 5–15)
BUN: 13 mg/dL (ref 6–20)
CO2: 23 mmol/L (ref 22–32)
Calcium: 10 mg/dL (ref 8.9–10.3)
Chloride: 107 mmol/L (ref 98–111)
Creatinine, Ser: 0.92 mg/dL (ref 0.44–1.00)
GFR, Estimated: >60 mL/min (ref >60)
Glucose, Bld: 132 mg/dL — ABNORMAL HIGH (ref 70–99)
Potassium: 3.9 mmol/L (ref 3.5–5.1)
Sodium: 142 mmol/L (ref 135–145)

## 2024-01-19 LAB — GLUCOSE, CAPILLARY
Glucose-Capillary: 140 mg/dL — ABNORMAL HIGH (ref 70–99)
Glucose-Capillary: 133 mg/dL — ABNORMAL HIGH (ref 70–99)
Glucose-Capillary: 135 mg/dL — ABNORMAL HIGH (ref 70–99)
Glucose-Capillary: 125 mg/dL — ABNORMAL HIGH (ref 70–99)
Glucose-Capillary: 122 mg/dL — ABNORMAL HIGH (ref 70–99)
Glucose-Capillary: 119 mg/dL — ABNORMAL HIGH (ref 70–99)

## 2024-01-19 LAB — PHOSPHORUS: Phosphorus: 3.1 mg/dL (ref 2.5–4.6)

## 2024-01-19 LAB — MAGNESIUM: Magnesium: 2 mg/dL (ref 1.7–2.4)

## 2024-01-19 MED ORDER — ORAL CARE MOUTH RINSE: 15.0000 mL | OROMUCOSAL | Status: DC | PRN

## 2024-01-19 NOTE — Progress Notes (Signed)
 Progress Note   Patient: Connie Shepard FMW:[id number] DOB: 1965/10/29 DOA: 01/14/2024     5 DOS: the patient was seen and examined on 01/19/2024   Brief hospital course: Connie Shepard is a 58 y.o. female with medical history significant for stroke with baseline Right hemiparesis, DM II, CHF, HTN, hypothyroidism. Patient was brought to the ED via EMS with reports of unresponsiveness, right gaze, and aphasia.  Family reported that patient was last known well at about 8 PM last night when she went to bed.  This morning when she woke up she is not feeling well and was not talking much at all, it was about 12:30 PM after she woke up from a nap and I did notice that she was not talking at all.  EMS noted that she was gazing to the right on the following directions. On my evaluation, patient is awake, but gazing to the right and unable to answer questions.   ED Course: BP 219/155.  Heart rate 102-112.  RR 11-22.   Code stroke called.  Patient outside window for thrombolysis.   Head CT-suggested an acute infarct. Subsequent MRI brain - Acute infarcts in the right corona radiata, caudate, lentiform nucleus, and external capsule. Extensive chronic microvascular ischemic changes, generalized parenchymal volume loss, and multiple remote infarcts. CTA head and neck-severe stenosis in several arteries. Tele-neurology was consulted, okay for patient to be admitted here at [facility].   Assessment & Plan:   Principal Problem:   Acute CVA (cerebrovascular accident) [facility]) Active Problems:   Essential hypertension   Diabetes mellitus (HCC)   Hypothyroidism   Chronic combined systolic and diastolic congestive heart failure (HCC)    Assessment and Plan: Acute CVA- POA: History of stroke, with baseline right hemiparesis  - MRI brain-  Acute infarcts in the right corona radiata, caudate, lentiform nucleus, and external capsule. Extensive chronic microvascular ischemic changes, generalized parenchymal volume  loss, and multiple remote infarcts.   - CTA head and neck reviewed stenosis in multiple vessels. - Neurology consulted, recommended: aspirin , Plavix , x 3 months then continue with aspirin  325 daily For now continue with aspirin  and 25 mg rectally- high-dose statin but patient is unable to tolerate p.o.  Remains aphasic, with right-sided hemiplegia, with right upper arm contracted Now moving left upper extremities, minimal left lower extremity Severe dysphagia -status post extensive evaluation by speech advised PEG tube placement. She pulled her NG out. Continue gentle IV fluids. Palliative care following, prognosis remain poor due to recurrent stroke-severe dysphagia. Family wished for DNR, if declines hospice. IR for placement of PEG tube on Monday, 01/20/20[facility] at [facility]. Subsequent plan to discharge to SNF with PEG tube, with close palliative care follow-up and further decline considering hospice  Hypertension- uncontrolled Increased IV hydralazine  to 20mg  q4 prn, continue clonidine patch. Labetolol IV for very high BP. Unable to give PO, as she has no NG tube, failed SLP.   Diabetes mellitus A1c 12/02/2023-8.4.  Continue accucheks, sliding scale q6h Hold home Lantus  while NPO, metformin    Chronic combined systolic and diastolic CHF stable and compensated.   Last echo 2022 EF 20% with global hypokinesis Repeat 2D Echo: EF estimated 25%, severe decreased LV function, finding consistent with Takotsubo cardiomyopathy.  Severe LVH Gentle hydration as she is NPO. Hold torsemide  40 mg twice daily, spironolactone   Ethics: Changed to DNR limited Palliative care consulted, mom and dad present at bedside.  Recurrent stroke, with progressive neurological changes on the new stroke complicated by aphasia, dysphagia-therefore prognosis is  poor.  If patient continues to decline may be a candidate for hospice.  Peg tube planned at [facility] with IR, schedule yet to be confirmed.  Nursing supportive  care. Fall, aspiration precautions. Diet:  Diet Orders (From admission, onward)     Start     Ordered   01/14/24 1329  Diet NPO time specified  Diet effective now       Comments: NPO until stroke swallow screen is complete   01/14/24 1329           DVT prophylaxis: enoxaparin  (LOVENOX ) injection 40 mg Start: 01/14/24 1930  Level of care: Stepdown   Code Status: Limited: Do not attempt resuscitation (DNR) -DNR-LIMITED -Do Not Intubate/DNI   Subjective: Patient is seen and examined today morning.  She is a poor historian due to aphasia. Seems restless.  BP not responding to IV meds per RN.  Parents at bedside. Awaiting PEG placement at [facility].  Physical Exam: Vitals:   01/19/24 1130 01/19/24 1200 01/19/24 1230 01/19/24 1300  BP: (!) 184/121 (!) 198/109 (!) 187/104 (!) 170/94  Pulse: 83 76 75 79  Resp: 12 14 12 15   Temp:      TempSrc:      SpO2: 99% 99% 99% 100%  Weight:      Height:        General - Middle aged obese Caucasian female, no apparent distress HEENT - PERRLA, EOMI, atraumatic head, non tender sinuses. Lung -breath sounds, basal rales, rhonchi, no wheezes. Heart - S1, S2 heard, no murmurs, rubs, trace pedal edema. Abdomen - Soft, non tender obese, bowel sounds good. Neuro - Alert, awake, moving left upper extremity, minimal left lower extremity, does not move left side of her body.  Skin - Warm and dry.  Data Reviewed:      Latest Ref Rng & Units 01/14/2024    1:34 PM 01/14/2024    1:31 PM 08/27/2020    4:34 AM  CBC  WBC 4.0 - 10.5 K/uL  7.8  6.6   Hemoglobin 12.0 - 15.0 g/dL 80.9  80.5  87.2   Hematocrit 36.0 - 46.0 % 56.0  55.9  39.8   Platelets 150 - 400 K/uL  216  192       Latest Ref Rng & Units 01/19/2024    6:07 AM 01/18/2024    5:31 AM 01/17/2024    5:27 AM  BMP  Glucose 70 - 99 mg/dL 867  830  857   BUN 6 - 20 mg/dL 13  16  12    Creatinine 0.44 - 1.00 mg/dL 9.07  8.96  9.09   Sodium 135 - 145 mmol/L 142  143  143   Potassium 3.5 - 5.1  mmol/L 3.9  3.3  3.4   Chloride 98 - 111 mmol/L 107  105  104   CO2 22 - 32 mmol/L 23  24  24    Calcium  8.9 - 10.3 mg/dL 89.9  9.9  9.8    DG Abd 1 View Result Date: 01/18/2024 CLINICAL DATA:  NG tube EXAM: ABDOMEN - 1 VIEW COMPARISON:  01/17/2024 FINDINGS: Upper gas pattern is unremarkable. No enteric tube is visualized within the included portions of the chest or upper abdomen. IMPRESSION: No enteric tube is visualized within the included portions of the chest or upper abdomen. Electronically Signed   By: Luke Bun M.D.   On: 01/18/2024 00:51   DG Connie Shepard Tube Plc W/Fl W/Rad Result Date: 01/17/2024 INDICATION: Dysphagia secondary to acute stroke.  Dobbhoff placement requested. EXAM: NASO G TUBE PLACEMENT WITH FL AND WITH RAD FLUOROSCOPY TIME:  Radiation Exposure Index (as provided by the fluoroscopic device): 4.6 mGy Kerma COMPLICATIONS: None immediate PROCEDURE: The Dobbhoff tube was lubricated with viscous lidocaine  inserted into the right nostril. Under intermittent fluoroscopic guidance, the Dobbhoff tube was advanced through the stomach with tip in the stomach body. 20 mL injection of contrast confirms position a spot fluoroscopic image was saved for documentation purposes. The tube was affixed to the patient's nose with tape. The patient tolerated the procedure well without immediate postprocedural complication. IMPRESSION: Successful fluoroscopic guided placement of Dobbhoff tube with tip terminating in the body of the stomach. The tube is ready for immediate use. This exam was performed by Solmon Ku PA-C, and was supervised and interpreted by Wilkie Lent, MD. Electronically Signed   By: Wilkie Lent M.D.   On: 01/17/2024 15:55    Family Communication: Discussed with patient's parents at bedside.  They understand and agree. All questions answered.  Disposition: Status is: Inpatient Remains inpatient appropriate because: PEG tube placement with IR at [facility] and then dc plan once  able to tolerate tube feeds at goal rate  Planned Discharge Destination: Skilled nursing facility     Time spent: 44 minutes  Author: Concepcion Riser, MD 01/19/2024 1:37 PM Secure chat 7am to 7pm For on call review [email or web address].

## 2024-01-19 NOTE — Plan of Care (Signed)
  Problem: Clinical Measurements: Goal: Ability to maintain clinical measurements within normal limits will improve Outcome: Progressing Goal: Cardiovascular complication will be avoided Outcome: Progressing   Problem: Elimination: Goal: Will not experience complications related to urinary retention Outcome: Progressing   Problem: Pain Managment: Goal: General experience of comfort will improve and/or be controlled Outcome: Progressing   Problem: Coping: Goal: Ability to adjust to condition or change in health will improve Outcome: Progressing   Problem: Nutrition: Goal: Adequate nutrition will be maintained Outcome: Not Progressing

## 2024-01-20 ENCOUNTER — Inpatient Hospital Stay (HOSPITAL_COMMUNITY)

## 2024-01-20 DIAGNOSIS — E1369 Other specified diabetes mellitus with other specified complication: Secondary | ICD-10-CM | POA: Diagnosis not present

## 2024-01-20 DIAGNOSIS — Z7189 Other specified counseling: Secondary | ICD-10-CM | POA: Diagnosis not present

## 2024-01-20 DIAGNOSIS — I10 Essential (primary) hypertension: Secondary | ICD-10-CM | POA: Diagnosis not present

## 2024-01-20 DIAGNOSIS — Z558 Other problems related to education and literacy: Secondary | ICD-10-CM | POA: Diagnosis not present

## 2024-01-20 DIAGNOSIS — I639 Cerebral infarction, unspecified: Secondary | ICD-10-CM | POA: Diagnosis not present

## 2024-01-20 DIAGNOSIS — E039 Hypothyroidism, unspecified: Secondary | ICD-10-CM | POA: Diagnosis not present

## 2024-01-20 DIAGNOSIS — Z515 Encounter for palliative care: Secondary | ICD-10-CM | POA: Diagnosis not present

## 2024-01-20 LAB — COMPREHENSIVE METABOLIC PANEL WITH GFR
ALT: 10 U/L (ref 0–44)
AST: 10 U/L — ABNORMAL LOW (ref 15–41)
Albumin: 3.5 g/dL (ref 3.5–5.0)
Alkaline Phosphatase: 73 U/L (ref 38–126)
Anion gap: 13 (ref 5–15)
BUN: 13 mg/dL (ref 6–20)
CO2: 23 mmol/L (ref 22–32)
Calcium: 9.3 mg/dL (ref 8.9–10.3)
Chloride: 107 mmol/L (ref 98–111)
Creatinine, Ser: 0.97 mg/dL (ref 0.44–1.00)
GFR, Estimated: >60 mL/min (ref >60)
Glucose, Bld: 132 mg/dL — ABNORMAL HIGH (ref 70–99)
Potassium: 3.2 mmol/L — ABNORMAL LOW (ref 3.5–5.1)
Sodium: 143 mmol/L (ref 135–145)
Total Bilirubin: 0.8 mg/dL (ref 0.0–1.2)
Total Protein: 6.1 g/dL — ABNORMAL LOW (ref 6.5–8.1)

## 2024-01-20 LAB — GLUCOSE, CAPILLARY
Glucose-Capillary: 111 mg/dL — ABNORMAL HIGH (ref 70–99)
Glucose-Capillary: 114 mg/dL — ABNORMAL HIGH (ref 70–99)
Glucose-Capillary: 118 mg/dL — ABNORMAL HIGH (ref 70–99)
Glucose-Capillary: 119 mg/dL — ABNORMAL HIGH (ref 70–99)
Glucose-Capillary: 123 mg/dL — ABNORMAL HIGH (ref 70–99)
Glucose-Capillary: 129 mg/dL — ABNORMAL HIGH (ref 70–99)
Glucose-Capillary: 132 mg/dL — ABNORMAL HIGH (ref 70–99)
Glucose-Capillary: 133 mg/dL — ABNORMAL HIGH (ref 70–99)
Glucose-Capillary: 144 mg/dL — ABNORMAL HIGH (ref 70–99)

## 2024-01-20 LAB — URINALYSIS, ROUTINE W REFLEX MICROSCOPIC
Bilirubin Urine: NEGATIVE
Glucose, UA: NEGATIVE mg/dL
Ketones, ur: 5 mg/dL — AB
Leukocytes,Ua: NEGATIVE
Nitrite: NEGATIVE
Protein, ur: 30 mg/dL — AB
RBC / HPF: >50 RBC/hpf (ref 0–5)
Specific Gravity, Urine: 1.013 (ref 1.005–1.030)
pH: 6 (ref 5.0–8.0)

## 2024-01-20 LAB — PHOSPHORUS: Phosphorus: 3.7 mg/dL (ref 2.5–4.6)

## 2024-01-20 LAB — MAGNESIUM: Magnesium: 2 mg/dL (ref 1.7–2.4)

## 2024-01-20 MED ORDER — CARVEDILOL 12.5 MG PO TABS
12.5000 mg | ORAL_TABLET | Freq: Two times a day (BID) | ORAL | Status: DC
  Administered 2024-01-22: 12.5 mg
  Filled 2024-01-20: qty 1

## 2024-01-20 MED ORDER — ENALAPRILAT 1.25 MG/ML IV SOLN
1.2500 mg | Freq: Four times a day (QID) | INTRAVENOUS | Status: DC
  Administered 2024-01-20 – 2024-01-27 (×29): 1.25 mg via INTRAVENOUS
  Filled 2024-01-20 (×37): qty 1

## 2024-01-20 MED ORDER — HYDRALAZINE HCL 20 MG/ML IJ SOLN
20.0000 mg | INTRAMUSCULAR | Status: DC | PRN
  Administered 2024-01-20 – 2024-01-25 (×8): 20 mg via INTRAVENOUS
  Filled 2024-01-20 (×8): qty 1

## 2024-01-20 MED ORDER — LORAZEPAM 2 MG/ML IJ SOLN
0.5000 mg | INTRAMUSCULAR | Status: DC | PRN
  Administered 2024-01-20 – 2024-01-22 (×4): 0.5 mg via INTRAVENOUS
  Filled 2024-01-20 (×5): qty 1

## 2024-01-20 MED ORDER — SPIRONOLACTONE 25 MG PO TABS
25.0000 mg | ORAL_TABLET | Freq: Every day | ORAL | Status: DC
  Administered 2024-01-22 – 2024-01-24 (×3): 25 mg
  Filled 2024-01-20 (×3): qty 1

## 2024-01-20 MED ORDER — LOSARTAN POTASSIUM 50 MG PO TABS
50.0000 mg | ORAL_TABLET | Freq: Every day | ORAL | Status: DC
Start: 2024-01-20 — End: 2024-01-23
  Administered 2024-01-22: 50 mg
  Filled 2024-01-20: qty 1

## 2024-01-20 NOTE — NC FL2 (Signed)
 [place]  MEDICAID FL2 LEVEL OF CARE FORM     IDENTIFICATION  [name] Birthdate: 1965-08-03 Sex: female Admission Date (Current Location): 01/14/2024  [place] and [place] Number:  Connie Shepard [id number] [facility] Facility and Address:  [facility],  [address], [place] [address]      Provider Number: [id number]  Attending Physician Name and Address:  Darci Pore, MD  Relative Name and Phone Number:       Current Level of Care: Hospital Recommended Level of Care: Skilled Nursing Facility Prior Approval Number:    Date Approved/Denied:   PASRR Number: pending  Discharge Plan: SNF    Current Diagnoses: Patient Active Problem List   Diagnosis Date Noted   Chronic combined systolic and diastolic congestive heart failure (HCC) 08/25/2020   Acute CVA (cerebrovascular accident) (HCC) 08/24/2020   Borderline prolonged QT interval 08/24/2020   Type 2 diabetes mellitus with complication, without long-term current use of insulin  (HCC) 03/16/2020   Anasarca    Diabetes mellitus (HCC)    Hypokalemia    Vitamin D  deficiency    Hypothyroidism    Physical deconditioning    Acute on chronic systolic CHF (congestive heart failure) (HCC) 03/09/2020   Acute on chronic systolic HF (heart failure) (HCC) 03/09/2020   Hyperglycemia 12/22/2018   Hypertensive crisis 04/04/2018   Acute CHF (congestive heart failure) (HCC) 04/04/2018   Mild renal insufficiency 04/04/2018   Elevated troponin 04/04/2018   Bronchitis    Obesity    Seborrheic dermatitis of scalp 05/23/2016   Essential hypertension 12/02/2015   Depression 12/02/2015   Primary insomnia 12/02/2015   Morbid obesity with body mass index of 50.0-59.9 in adult [facility]) 12/02/2015   Attention deficit hyperactivity disorder (ADHD), predominantly inattentive type 12/02/2015    Orientation RESPIRATION BLADDER Height & Weight     Self  Normal External catheter Weight: 188 lb 4.4 oz (85.4  kg) Height:  5' 5 (165.1 cm)  BEHAVIORAL SYMPTOMS/MOOD NEUROLOGICAL BOWEL NUTRITION STATUS      Incontinent Feeding tube  AMBULATORY STATUS COMMUNICATION OF NEEDS Skin     Verbally Normal                       Personal Care Assistance Level of Assistance  Total care       Total Care Assistance: Maximum assistance   Functional Limitations Info  Sight, Hearing, Speech Sight Info: Adequate Hearing Info: Adequate Speech Info: Impaired    SPECIAL CARE FACTORS FREQUENCY  PT (By licensed PT), OT (By licensed OT)     PT Frequency: 5x weekly OT Frequency: 5x weekly            Contractures      Additional Factors Info  Code Status, Allergies, Psychotropic, Insulin  Sliding Scale Code Status Info: Full Allergies Info: Hydroxyzine  Psychotropic Info: Lexapro , Wellbutrin          Current Medications (01/20/2024):  This is the current hospital active medication list Current Facility-Administered Medications  Medication Dose Route Frequency Provider Last Rate Last Admin   acetaminophen  (TYLENOL ) tablet 650 mg  650 mg Oral Q4H PRN Emokpae, Ejiroghene E, MD       Or   acetaminophen  (TYLENOL ) 160 MG/5ML solution 650 mg  650 mg Per Tube Q4H PRN Emokpae, Ejiroghene E, MD       Or   acetaminophen  (TYLENOL ) suppository 650 mg  650 mg Rectal Q4H PRN Emokpae, Ejiroghene E, MD       amLODipine (NORVASC) tablet 10 mg  10 mg Per Tube Daily Shahmehdi, Seyed A, MD       aspirin  suppository 150 mg  150 mg Rectal Daily Shahmehdi, Seyed A, MD   150 mg at 01/19/24 0915   atorvastatin  (LIPITOR) tablet 40 mg  40 mg Per Tube Daily Shahmehdi, Seyed A, MD       Chlorhexidine  Gluconate Cloth 2 % PADS 6 each  6 each Topical Q0600 Emokpae, Ejiroghene E, MD   6 each at 01/19/24 0405   cloNIDine (CATAPRES - Dosed in mg/24 hr) patch 0.3 mg  0.3 mg Transdermal Weekly Shahmehdi, Seyed A, MD   0.3 mg at 01/17/24 1744   clopidogrel  (PLAVIX ) tablet 75 mg  75 mg Per Tube Daily Shahmehdi, Seyed A, MD        dextrose 5% lactated ringers  1,000 mL with potassium chloride  20 mEq infusion   Intravenous Continuous Shahmehdi, Seyed A, MD 40 mL/hr at 01/20/24 0509 Infusion Verify at 01/20/24 0509   enoxaparin  (LOVENOX ) injection 40 mg  40 mg Subcutaneous Q24H Emokpae, Ejiroghene E, MD   40 mg at 01/19/24 1930   feeding supplement (OSMOLITE 1.5 CAL) liquid 1,000 mL  1,000 mL Per Tube Continuous Shahmehdi, Seyed A, MD   Stopped at 01/18/24 1414   feeding supplement (PROSource TF20) liquid 60 mL  60 mL Per Tube Daily Shahmehdi, Seyed A, MD   60 mL at 01/17/24 1639   hydrALAZINE  (APRESOLINE ) injection 10 mg  10 mg Intravenous Q4H PRN Willette Jest A, MD   10 mg at 01/20/24 9192   HYDROmorphone  (DILAUDID ) injection 0.5 mg  0.5 mg Intravenous Q2H PRN Willette Jest A, MD   0.5 mg at 01/17/24 2156   insulin  aspart (novoLOG ) injection 0-9 Units  0-9 Units Subcutaneous Q6H Emokpae, Ejiroghene E, MD   1 Units at 01/20/24 0046   labetalol  (NORMODYNE ) injection 10 mg  10 mg Intravenous Q2H PRN Emokpae, Ejiroghene E, MD   10 mg at 01/20/24 9096   levothyroxine  (SYNTHROID ) tablet 100 mcg  100 mcg Oral Q0600 Shahmehdi, Seyed A, MD       multivitamin with minerals tablet 1 tablet  1 tablet Per Tube Daily Shahmehdi, Seyed A, MD   1 tablet at 01/17/24 1639   Oral care mouth rinse  15 mL Mouth Rinse PRN Sreeram, Narendranath, MD       prochlorperazine  (COMPAZINE ) injection 10 mg  10 mg Intravenous Q6H PRN Adefeso, Oladapo, DO   10 mg at 01/15/24 2350   senna-docusate (Senokot-S) tablet 1 tablet  1 tablet Oral QHS PRN Emokpae, Ejiroghene E, MD       thiamine (VITAMIN B1) tablet 100 mg  100 mg Per Tube Daily Shahmehdi, Seyed A, MD   100 mg at 01/17/24 1640     Discharge Medications: Please see discharge summary for a list of discharge medications.  Relevant Imaging Results:  Relevant Lab Results:   Additional Information SSN: [id number]  Connie Saddie Kim, LCSW

## 2024-01-20 NOTE — Progress Notes (Signed)
 Progress Note   Patient: Connie Shepard FMW:[id number] DOB: Jan 13, 1966 DOA: 01/14/2024     6 DOS: the patient was seen and examined on 01/20/2024   Brief hospital course: Connie Shepard is a 58 y.o. female with medical history significant for stroke with baseline Right hemiparesis, DM II, CHF, HTN, hypothyroidism. Patient was brought to the ED via EMS with reports of unresponsiveness, right gaze, and aphasia.  Family reported that patient was last known well at about 8 PM last night when she went to bed.  This morning when she woke up she is not feeling well and was not talking much at all, it was about 12:30 PM after she woke up from a nap and I did notice that she was not talking at all.  EMS noted that she was gazing to the right on the following directions. On my evaluation, patient is awake, but gazing to the right and unable to answer questions.   ED Course: BP 219/155.  Heart rate 102-112.  RR 11-22.   Code stroke called.  Patient outside window for thrombolysis.   Head CT-suggested an acute infarct. Subsequent MRI brain - Acute infarcts in the right corona radiata, caudate, lentiform nucleus, and external capsule. Extensive chronic microvascular ischemic changes, generalized parenchymal volume loss, and multiple remote infarcts. CTA head and neck-severe stenosis in several arteries. Tele-neurology was consulted, okay for patient to be admitted here at [facility].   Assessment & Plan:   Principal Problem:   Acute CVA (cerebrovascular accident) [facility]) Active Problems:   Essential hypertension   Diabetes mellitus (HCC)   Hypothyroidism   Chronic combined systolic and diastolic congestive heart failure (HCC)    Assessment and Plan: Acute CVA- POA: History of stroke, with baseline right hemiparesis  - MRI brain-  Acute infarcts in the right corona radiata, caudate, lentiform nucleus, and external capsule. Extensive chronic microvascular ischemic changes, generalized parenchymal volume  loss, and multiple remote infarcts.   - CTA head and neck reviewed stenosis in multiple vessels. - Neurology consulted, recommended: aspirin , Plavix , x 3 months then continue with aspirin  325 daily For now continue with aspirin  and 25 mg rectally- high-dose statin but patient is unable to tolerate p.o.  Remains aphasic, with right-sided hemiplegia, with right upper arm contracted Now moving left upper extremities, minimal left lower extremity Severe dysphagia -status post extensive evaluation by speech advised PEG tube placement. She pulled her NG out. Continue gentle IV fluids. Palliative care following, prognosis remain poor due to recurrent stroke-severe dysphagia. Family wished for DNR, if declines hospice. IR for placement of PEG tube on Monday, 01/20/2024 at [facility]. Subsequent plan to discharge to SNF with PEG tube, with close palliative care follow-up and further decline considering hospice  Hypertension- uncontrolled Increased IV hydralazine  to 20mg  q4 prn, continue clonidine patch. Labetolol IV for very high BP. Enalapril started per pharmacy protocol. Unable to give PO, as she has no NG tube, failed SLP.   Diabetes mellitus A1c 12/02/2023-8.4.  Continue accucheks, sliding scale q6h Hold home Lantus  while NPO, metformin    Chronic combined systolic and diastolic CHF stable and compensated.   Last echo 2022 EF 20% with global hypokinesis Repeat 2D Echo: EF estimated 25%, severe decreased LV function, finding consistent with Takotsubo cardiomyopathy.  Severe LVH Gentle hydration as she is NPO. Hold torsemide  40 mg twice daily, spironolactone   Ethics: Changed to DNR limited Palliative care consulted, mom and dad present at bedside.  Recurrent stroke, with progressive neurological changes on the new stroke complicated  by aphasia, dysphagia-therefore prognosis is poor.  If patient continues to decline may be a candidate for hospice.  Peg tube planned at [facility] with IR, schedule yet  to be confirmed.  Nursing supportive care. Fall, aspiration precautions. Diet:  Diet Orders (From admission, onward)     Start     Ordered   01/14/24 1329  Diet NPO time specified  Diet effective now       Comments: NPO until stroke swallow screen is complete   01/14/24 1329           DVT prophylaxis: enoxaparin  (LOVENOX ) injection 40 mg Start: 01/14/24 1930  Level of care: Stepdown   Code Status: Limited: Do not attempt resuscitation (DNR) -DNR-LIMITED -Do Not Intubate/DNI   Subjective: Patient is seen and examined today morning.  She is a poor historian due to aphasia. BP not responding to IV meds per RN.  Parents at bedside. Awaiting PEG placement at [facility].  Physical Exam: Vitals:   01/20/24 1500 01/20/24 1515 01/20/24 1530 01/20/24 1657  BP: (!) 207/102  (!) 212/98 (!) 211/107  Pulse: 70 62 65   Resp: 10 10 10    Temp:      TempSrc:      SpO2: 100% 100% 100%   Weight:      Height:        General - Middle aged obese Caucasian female, no apparent distress HEENT - PERRLA, EOMI, atraumatic head, non tender sinuses. Lung -breath sounds, basal rales, rhonchi, no wheezes. Heart - S1, S2 heard, no murmurs, rubs, trace pedal edema. Abdomen - Soft, non tender obese, bowel sounds good. Neuro - Alert, awake, moving left upper extremity, minimal left lower extremity, does not move left side of her body.  Skin - Warm and dry.  Data Reviewed:      Latest Ref Rng & Units 01/14/2024    1:34 PM 01/14/2024    1:31 PM 08/27/2020    4:34 AM  CBC  WBC 4.0 - 10.5 K/uL  7.8  6.6   Hemoglobin 12.0 - 15.0 g/dL 80.9  80.5  87.2   Hematocrit 36.0 - 46.0 % 56.0  55.9  39.8   Platelets 150 - 400 K/uL  216  192       Latest Ref Rng & Units 01/20/2024    7:49 AM 01/19/2024    6:07 AM 01/18/2024    5:31 AM  BMP  Glucose 70 - 99 mg/dL 867  867  830   BUN 6 - 20 mg/dL 13  13  16    Creatinine 0.44 - 1.00 mg/dL 9.02  9.07  8.96   Sodium 135 - 145 mmol/L 143  142  143   Potassium 3.5  - 5.1 mmol/L 3.2  3.9  3.3   Chloride 98 - 111 mmol/L 107  107  105   CO2 22 - 32 mmol/L 23  23  24    Calcium  8.9 - 10.3 mg/dL 9.3  89.9  9.9    No results found.   Family Communication: Discussed with patient's parents at bedside.  They understand and agree. All questions answered.  Disposition: Status is: Inpatient Remains inpatient appropriate because: PEG tube placement with IR at [facility] and then dc plan once able to tolerate tube feeds at goal rate  Planned Discharge Destination: Skilled nursing facility     Time spent: 44 minutes  Author: Concepcion Riser, MD 01/20/2024 4:59 PM Secure chat 7am to 7pm For on call review [email or web address].

## 2024-01-20 NOTE — Plan of Care (Signed)

## 2024-01-20 NOTE — Progress Notes (Signed)
 Daily Progress Note   [name]       Date: 01/20/2024 DOB: Apr 15, 1966  Age: 58 y.o. MRN#: [id number] Attending Physician: Darci Pore, MD Primary Care Physician: Bobbette Coye LABOR, MD Admit Date: 01/14/2024  Reason for Consultation/Follow-up: Disposition, Establishing goals of care, and Family-Clinician negotiation  Subjective:   58 y.o. female  with past medical history of CHF, ADD, bronchitis, depression, type 2 diabetes, hypertension, obesity, prior CVA, chronic combined systolic and diastolic heart failure, vitamin D  deficiency, and hypothyroidism due to Hashimoto's thyroiditis admitted on 01/14/2024 with right-sided weakness and aphasia.    Workup revealed Acute infarcts in the right corona radiata, caudate, lentiform nucleus, and external capsule.  Also noted to have extensive microvascular ischemic changes and changes consistent with possible component of hypertensive encephalopathy.  Per prior notes, patient has a history of prior strokes.  Deficits include right-sided weakness, aphasia, and dysphagia.   01/15/2024: initial completed, discussed GOC/ACP. Transitioned from full code to DNR/DNI. Family considering transition to comfort care vs feeding tube, time for outcomes.    01/16/2024: further GOC discussion. SNF for rehab with palliative care vs home with hospice support. Family hopeful she will make some improvements so preferred SNF for rehab. However, they are hesitant about feeding tube in the nose due to discomfort and would prefer PEG tube be placed. Care coordination was in process.   01/17/2024: Family confirmed plan to d/c to SNF for skilled therapy and OP Pall follow up with ultimate plan to return home with family support. DNR/DNI. Coordinated with team to get Dobhoff tube placed 10/03 for enteral  nutrition until able to establish PEG. Tentatively planned to get PEG early next week Mon/Tues.  Today, Weekend events reviewed.  Unfortunately, on the night of 01/17/2024 patient self discontinued Dobbhoff tube.  Unable to reinsert NG tube with multiple attempts.  Plan to hold off on reattempting any further enteral tube access as hope was to obtain access on 01/20/2024.  She was seen by speech therapy who continue to recommend n.p.o.  Crushed with pure was attempted by nursing but patient unable to swallow.  Okay to have ice chips as long as oral care is provided prior to offering.  Labs independently reviewed.  Mild hypokalemia noted with a potassium of 3.2.  Likely in the setting of n.p.o. status.  Would recommend monitoring and repletion as indicated.  Total protein also low at 6.1.  Blood sugar slightly elevated at 132.  She is on a dextrose drip given n.p.o. status.  Would recommend continuing to monitor.  Vital signs reviewed.  Patient continues to be quite hypertensive with  blood pressure readings in the 190s to 200s systolic and 90s to 110s diastolic.  Multiple medication changes have been made.  She was started on clonidine patch and IV and hydralazine  dose was increased.  See below for details of care collaboration with attending physician, nursing staff, IR staff and this nurse practitioner.  No as needed symptom meds required on 24-hour look back.  Independent history obtained from nursing staff and patient's family as she is aphasic and unable to speak.  Per nursing, patient continues to pull out tubes and wires.  Remains hypertensive.  Bedside nursing staff has been monitoring blood pressure closely and has been coordinating with attending team to make adjustments to regimen.  Spoke with patient's parents at bedside.  They state that she has been working with therapy and doing pretty good over the weekend.  They state that both of her children came to visit and this seemed to boost her  spirits.  She was able to move her right foot and wiggle her toes some which is an improvement.  They also note that she raised her left arm and held it up.  Overall, they feel she is improving.  They agree with the plan to proceed with PEG tube placement and transfer to SNF for skilled therapy with the ultimate goal to return home.  Provided extensive education to patient's parents regarding risk and benefit of PEG tube.  We discussed that she is of higher than average risk given requirement for DAPT, low EF, and persistent hypertension.  Discussed risks for bleeding, infection, and reaction to sedative medication.  Educated them that she would not be put under general anesthesia or intubated but would receive some sedation medicine.  We also discussed DNR/DNI status.  Discussed if this would be reversed during procedure or maintained.  Family states that when the Lord calls her home they do not want to interfere.  They wish to maintain DNR status even during procedure.  They verbalized understanding of risks and benefits of proceeding with PEG and are hopeful to give her a chance to improve at SNF with therapy and understand that in order to do this she would need a PEG therefore, they wish to proceed with the PEG understanding that there are risks.  We also discussed community resources that patient and family would need once she returns home.  Patient was bedbound and completely dependent for care prior to this stroke but will certainly need more assistance once she returns home.  We discussed various community programs such as CAP (unfortunately they have not had luck obtaining CAP) vs program such as PACE programs.  Patient's family plans to research and pursue additional community support.  Questions and concerns were addressed.  The family was encouraged to call with questions or concerns.  PMT will continue to support holistically.  Chart review/care coordination:  Completed extensive chart review  including EPIC notes, labs, MAR, and vital signs. Coordinated care with attending physician, IR APP's, bedside nursing staff, pharmacy, and TOC.  Patient continues to be quite hypertensive.  Collaborated with attending physician on pharmacy on how to address elevated blood pressure readings.  Bedside nursing staff plans to continue to monitor blood pressure readings closely.  I also collaborated with IR.  They recommend blood pressure be at or less than 160/90 to proceed with PEG given she is on dual antiplatelet therapy and at high risk for complications.   Length of Stay: 6   Physical Exam Constitutional:  Appearance: She is obese.     Comments: Chronically ill-appearing  HENT:     Mouth/Throat:     Mouth: Mucous membranes are dry.  Pulmonary:     Effort: Pulmonary effort is normal. No respiratory distress.  Neurological:     Mental Status: She is alert.     Comments: Aphasic, able to move left hand and arm.  Able to grip examiners hand with left upper extremity.  Right-sided weakness present.             Vital Signs: BP (!) 210/100   Pulse 69   Temp 97.7 F (36.5 C) (Axillary)   Resp 12   Ht 5' 5 (1.651 m)   Wt 85.4 kg   SpO2 99%   BMI 31.33 kg/m  SpO2: SpO2: 99 % O2 Device: O2 Device: Room Air O2 Flow Rate:        Palliative Assessment/Data: 10 to 20%   Palliative Care Assessment & Plan   Patient Profile/Assessment:  58 year old female with complex medical history including prior stroke who was admitted to the hospital on 01/14/2024 and diagnosed with recurrent stroke.  Patient with profound deficits.  At baseline, she was bedbound/total care for all ADLs due to right sided hemiparesis from prior stroke.  Now globally aphasic and with severe dysphagia that requires n.p.o. status.  Initial completed 01/15/2024.  She was transition from full code to DNR/DNI.  Discussion regarding transition to comfort care versus placement of PEG tube for SNF with outpatient  palliative follow-up. Ultimately family decides to proceed with PEG tube, STR in SNF, and OP Pall follow up. Dobbhoff tube for enteral nutrition was placed on 01/17/2024, unfortunately, patient self discontinued that evening.  Unable to reinsert NG tube for enteral access.  Plan to wait until patient is able to have PEG established 10/6 or 10/7.  Unfortunately, due to elevated blood pressure readings unable to have PEG placed on 10/6.  Collaborating with attending team, interventional radiology, and pharmacy to adjust blood pressure regimen to achieve goal blood pressure of less than 160/90 in order to proceed with PEG.  Symptoms appear stable on current regimen.  No changes to symptom regimen.  Palliative team will continue to support holistically.  Outpatient community resources discussed with family and they plan to pursue to obtain further assistance once patient returns home.     Recommendations/Plan:  DNR/DNI Continue to treat the treatable, time for outcomes Appears comfortable at present, continue current symptom regimen Need for enteral access for nutrition, hydration, and meds.  Dobbhoff tube placed on 10/3 and self discontinued.  Unable to insert NG tube despite multiple attempts.  Plan is for PEG 10/6 or 10/7. Collaborate with attending team to adjust medication regimen to achieve target blood pressure readings in order to proceed with PEG, PEG deferred to 10/7 given elevated blood pressure readings. Continue PT/OT/ST while inpatient D/c to SNF for short term rehab Outpatient palliative referral  Palliative medicine team will continue to follow for ongoing goals of care discussion, symptom management, and coordination of care.   Symptom management:  Continue hydromorphone  0.5 mg IV every 2 hours as needed (none needed on 24 hr look back) Continue Tylenol  PR as needed Continue Compazine  10 mg IV every 6 hours as needed Lorazepam  0.5 mg IV every 4 hrs as needed (none needed on 24 hr  look back)  Prognosis:   Guarded, poor, gradually improving    Discharge Planning: Skilled Nursing Facility for rehab with Palliative care service follow-up  Detailed review of medical records (labs, imaging, vital signs), medically appropriate exam, discussed with treatment team, counseling and education to patient, family, & staff, documenting clinical information, medication management, coordination of care    Billing based on MDM: High  Problems Addressed: One acute or chronic illness or injury that poses a threat to life or bodily function  Amount and/or Complexity of Data: Category 1:Assessment requiring an independent historian(s) and Category 3:Discussion of management or test interpretation with external physician/other qualified health care professional/appropriate source (not separately reported)  Risks: Parenteral controlled substances         Laymon CHRISTELLA Pinal, NP  Palliative Medicine Team Team phone # [phone]  Thank you for allowing the Palliative Medicine Team to assist in the care of this patient. Please utilize secure chat with additional questions, if there is no response within 30 minutes please call the above phone number.  Palliative Medicine Team providers are available by phone from 7am to 7pm daily and can be reached through the team cell phone.  Should this patient require assistance outside of these hours, please call the patient's attending physician.

## 2024-01-20 NOTE — Progress Notes (Signed)
 Placed NGT to left nare. Pt pulled out NGT before x-ray could be read by radiology. Notified Dr. Darci. See new orders. No drips at this time for BP ordered.

## 2024-01-21 DIAGNOSIS — Z515 Encounter for palliative care: Secondary | ICD-10-CM

## 2024-01-21 DIAGNOSIS — Z789 Other specified health status: Secondary | ICD-10-CM

## 2024-01-21 DIAGNOSIS — Z7189 Other specified counseling: Secondary | ICD-10-CM

## 2024-01-21 DIAGNOSIS — I639 Cerebral infarction, unspecified: Secondary | ICD-10-CM | POA: Diagnosis not present

## 2024-01-21 DIAGNOSIS — Z79899 Other long term (current) drug therapy: Secondary | ICD-10-CM | POA: Diagnosis not present

## 2024-01-21 DIAGNOSIS — Z66 Do not resuscitate: Secondary | ICD-10-CM | POA: Diagnosis not present

## 2024-01-21 DIAGNOSIS — Z558 Other problems related to education and literacy: Secondary | ICD-10-CM

## 2024-01-21 LAB — CBC WITH DIFFERENTIAL/PLATELET
Abs Immature Granulocytes: 0.02 K/uL (ref 0.00–0.07)
Abs Immature Granulocytes: 0.02 K/uL (ref 0.00–0.07)
Basophils Absolute: 0.1 K/uL (ref 0.0–0.1)
Basophils Absolute: 0.1 K/uL (ref 0.0–0.1)
Basophils Relative: 1 %
Basophils Relative: 1 %
Eosinophils Absolute: 0.2 K/uL (ref 0.0–0.5)
Eosinophils Absolute: 0.5 K/uL (ref 0.0–0.5)
Eosinophils Relative: 4 %
Eosinophils Relative: 5 %
HCT: 51.7 % — ABNORMAL HIGH (ref 36.0–46.0)
HCT: 49.6 % — ABNORMAL HIGH (ref 36.0–46.0)
Hemoglobin: 16.4 g/dL — ABNORMAL HIGH (ref 12.0–15.0)
Hemoglobin: 16.7 g/dL — ABNORMAL HIGH (ref 12.0–15.0)
Immature Granulocytes: 0 %
Immature Granulocytes: 0 %
Lymphocytes Relative: 18 %
Lymphocytes Relative: 22 %
Lymphs Abs: 1.2 K/uL (ref 0.7–4.0)
Lymphs Abs: 1.9 K/uL (ref 0.7–4.0)
MCH: 30.8 pg (ref 26.0–34.0)
MCH: 31.1 pg (ref 26.0–34.0)
MCHC: 31.7 g/dL (ref 30.0–36.0)
MCHC: 33.7 g/dL (ref 30.0–36.0)
MCV: 97.2 fL (ref 80.0–100.0)
MCV: 92.4 fL (ref 80.0–100.0)
Monocytes Absolute: 0.5 K/uL (ref 0.1–1.0)
Monocytes Absolute: 0.8 K/uL (ref 0.1–1.0)
Monocytes Relative: 7 %
Monocytes Relative: 9 %
Neutro Abs: 4.7 K/uL (ref 1.7–7.7)
Neutro Abs: 5.6 K/uL (ref 1.7–7.7)
Neutrophils Relative %: 70 %
Neutrophils Relative %: 63 %
Platelets: 41 K/uL — ABNORMAL LOW (ref 150–400)
Platelets: 188 K/uL (ref 150–400)
RBC: 5.32 MIL/uL — ABNORMAL HIGH (ref 3.87–5.11)
RBC: 5.37 MIL/uL — ABNORMAL HIGH (ref 3.87–5.11)
RDW: 14.6 % (ref 11.5–15.5)
RDW: 14.5 % (ref 11.5–15.5)
WBC: 6.6 K/uL (ref 4.0–10.5)
WBC: 8.9 K/uL (ref 4.0–10.5)
nRBC: 0 % (ref 0.0–0.2)
nRBC: 0 % (ref 0.0–0.2)

## 2024-01-21 LAB — GLUCOSE, CAPILLARY
Glucose-Capillary: 103 mg/dL — ABNORMAL HIGH (ref 70–99)
Glucose-Capillary: 110 mg/dL — ABNORMAL HIGH (ref 70–99)
Glucose-Capillary: 111 mg/dL — ABNORMAL HIGH (ref 70–99)
Glucose-Capillary: 124 mg/dL — ABNORMAL HIGH (ref 70–99)
Glucose-Capillary: 131 mg/dL — ABNORMAL HIGH (ref 70–99)
Glucose-Capillary: 96 mg/dL (ref 70–99)
Glucose-Capillary: 96 mg/dL (ref 70–99)

## 2024-01-21 LAB — PROTIME-INR
INR: 0.9 (ref 0.8–1.2)
Prothrombin Time: 12.8 s (ref 11.4–15.2)

## 2024-01-21 MED ORDER — LACTATED RINGERS IV SOLN: INTRAVENOUS | Status: AC

## 2024-01-21 MED ORDER — CLEVIDIPINE BUTYRATE 0.5 MG/ML IV EMUL
0.0000 mg/h | INTRAVENOUS | Status: DC
  Administered 2024-01-21: 2 mg/h via INTRAVENOUS
  Filled 2024-01-21: qty 50

## 2024-01-21 MED ORDER — DIPHENHYDRAMINE HCL 50 MG/ML IJ SOLN
25.0000 mg | Freq: Four times a day (QID) | INTRAMUSCULAR | Status: DC | PRN
  Administered 2024-01-21: 25 mg via INTRAVENOUS
  Filled 2024-01-21: qty 1

## 2024-01-21 MED ORDER — ESMOLOL HCL-SODIUM CHLORIDE 2000 MG/100ML IV SOLN
25.0000 ug/kg/min | INTRAVENOUS | Status: DC
  Administered 2024-01-21: 225 ug/kg/min via INTRAVENOUS
  Administered 2024-01-21: 250 ug/kg/min via INTRAVENOUS
  Administered 2024-01-21: 100 ug/kg/min via INTRAVENOUS
  Administered 2024-01-21: 225 ug/kg/min via INTRAVENOUS
  Administered 2024-01-21: 25 ug/kg/min via INTRAVENOUS
  Administered 2024-01-22: 175 ug/kg/min via INTRAVENOUS
  Administered 2024-01-22 (×2): 225 ug/kg/min via INTRAVENOUS
  Administered 2024-01-22: 200 ug/kg/min via INTRAVENOUS
  Administered 2024-01-22: 150 ug/kg/min via INTRAVENOUS
  Administered 2024-01-22 (×2): 225 ug/kg/min via INTRAVENOUS
  Administered 2024-01-22: 200 ug/kg/min via INTRAVENOUS
  Administered 2024-01-22: 175 ug/kg/min via INTRAVENOUS
  Administered 2024-01-22: 225 ug/kg/min via INTRAVENOUS
  Administered 2024-01-22 (×2): 200 ug/kg/min via INTRAVENOUS
  Administered 2024-01-23: 125 ug/kg/min via INTRAVENOUS
  Administered 2024-01-23: 100 ug/kg/min via INTRAVENOUS
  Administered 2024-01-23 (×2): 125 ug/kg/min via INTRAVENOUS
  Administered 2024-01-23 (×2): 150 ug/kg/min via INTRAVENOUS
  Administered 2024-01-24 (×3): 200 ug/kg/min via INTRAVENOUS
  Administered 2024-01-24: 100 ug/kg/min via INTRAVENOUS
  Administered 2024-01-24: 150 ug/kg/min via INTRAVENOUS
  Administered 2024-01-24: 50 ug/kg/min via INTRAVENOUS
  Administered 2024-01-25: 150 ug/kg/min via INTRAVENOUS
  Administered 2024-01-25 (×2): 175 ug/kg/min via INTRAVENOUS
  Administered 2024-01-25: 25 ug/kg/min via INTRAVENOUS
  Administered 2024-01-25: 175 ug/kg/min via INTRAVENOUS
  Filled 2024-01-21 (×3): qty 100

## 2024-01-21 NOTE — Progress Notes (Signed)
 Speech Language Pathology Treatment: Dysphagia  Patient Details Name: Connie Shepard MRN: [id number] DOB: 29-Jun-1965 Today's Date: 01/21/2024 Time: 9052-8996 SLP Time Calculation (min) (ACUTE ONLY): 16 min  Assessment / Plan / Recommendation Clinical Impression  Pt seen for dysphagia f/u session with various consistencies to assess for po readiness/MBS readiness with pt requiring min-mod verbal cues for completion of oral care, trials of various consistencies including single ice chips,  Nectar-thick liquids, honey-thick liquids and puree consistency with min oral holding/decreased bolus manipulation, delay in the initiation of the swallow and audible multiple swallows.  Less oral manipulation noted with ice chips this session, but slight increase in initiation of the swallow observed with nectar-thick liquids.  Patient min interactive with BP continuing to be elevated and flat affect noted.  She communicated with head nod/shake when asked simple questions and followed simple commands during oral care. Discussed POC with parents during session and they are in agreement and hopeful for PEG placement and ongoing rehab for dysphagia and cognitive/linguistic needs. ST will continue to f/u in acute setting for dysphagia/cog/linguistic tx.  HPI HPI: Connie Shepard is a 58 y.o. female with medical history significant for stroke with baseline right hemiparesis, diabetes mellitus, CHF, hypertension, hypothyroidism. Patient was brought to the ED via EMS with reports of unresponsiveness, right gaze, and aphasia. Family reported that patient was last known well at about 8 PM last night when she went to bed. This morning when she woke up she is not feeling well and was not talking much at all, it was about 12:30 PM after she woke up from a nap and I did notice that she was not talking at all. EMS noted that she was gazing to the right on the following directions. BSE recommendations for NPO. ST f/u for speech/language and  dysphagia tx. PEG pending d/t medical concenrs (elevated BP) delaying procedure.  ST f/u for dysphagia management/tx.      SLP Plan  Continue with current plan of care          Recommendations  Diet recommendations: NPO Medication Administration: Via alternative means                  Oral care QID;Oral care prior to ice chip/H20;Staff/trained caregiver to provide oral care   Frequent or constant Supervision/Assistance Dysphagia, oropharyngeal phase (R13.12)     Continue with current plan of care     Pat Connie Shepard,M.S., CCC-SLP  01/21/2024, 2:25 PM

## 2024-01-21 NOTE — Progress Notes (Signed)
 Occupational Therapy Treatment Patient Details Name: Connie Shepard MRN: [id number] DOB: 04-05-1966 Today's Date: 01/21/2024   History of present illness Connie Shepard is a 59 y.o. female with medical history significant for stroke with baseline right hemiparesis, diabetes mellitus, CHF, hypertension, hypothyroidism.  Patient was brought to the ED via EMS with reports of unresponsiveness, right gaze, and aphasia.  Family reported that patient was last known well at about 8 PM last night when she went to bed.  This morning when she woke up she is not feeling well and was not talking much at all, it was about 12:30 PM after she woke up from a nap and I did notice that she was not talking at all.  EMS noted that she was gazing to the right on the following directions.  On my evaluation, patient is awake, but gazing to the right and unable to answer questions.   OT comments  Pt agreeable to OT and PT co-treatment due to pt's complexity. Pt still non-verbal. Pt able to complete bed mobility with max A. P/ROM to L UE tolerated by pt but pt was noted to grimace as if it may have been painful. Pt also completed L lateral weight bearing during 5 second holds of lateral propping at the EOB. Pt left in the bed with call bell within reach and family present. Pt will benefit from continued OT in the hospital to increase strength, balance, and endurance for safe ADL's.         If plan is discharge home, recommend the following:  Two people to help with walking and/or transfers;Two people to help with bathing/dressing/bathroom;A lot of help with bathing/dressing/bathroom;Assistance with cooking/housework;Direct supervision/assist for medications management;Assistance with feeding;Assist for transportation;Help with stairs or ramp for entrance   Equipment Recommendations  None recommended by OT          Precautions / Restrictions Precautions Precautions: Fall Recall of Precautions/Restrictions:  Impaired Restrictions Weight Bearing Restrictions Per Provider Order: No       Mobility Bed Mobility Overal bed mobility: Needs Assistance Bed Mobility: Supine to Sit, Sit to Supine     Supine to sit: Max assist, HOB elevated, Total assist Sit to supine: Max assist, Total assist   General bed mobility comments: labored movement; assist to initiate and carry through movement.    Transfers                   General transfer comment: Not attempted today due to elevated BP.     Balance Overall balance assessment: Needs assistance Sitting-balance support: Feet supported, Single extremity supported Sitting balance-Leahy Scale: Poor Sitting balance - Comments: able to keep trunk in midline for ~30 seconds with R UE supported by pillow and L UE stabilized on the bed. Postural control: Posterior lean                                   Extremity/Trunk Assessment Upper Extremity Assessment RUE Deficits / Details: no change in status LUE Deficits / Details: No change from evaluation. Toleratetd P/ROM of shoulder with grimacing but full P/ROM for flexion and abduction.            Vision   Vision Assessment?: Vision impaired- to be further tested in functional context   Perception Perception Perception: Not tested   Praxis Praxis Praxis: Not tested   Communication Communication Communication: Impaired Factors Affecting Communication: Difficulty expressing self   Cognition Arousal:  Alert, Lethargic Behavior During Therapy: Flat affect Cognition: Cognition impaired             OT - Cognition Comments: Pt still unable to communicate today.                 Following commands: Impaired Following commands impaired: Follows one step commands inconsistently      Cueing   Cueing Techniques: Gestural cues, Tactile cues, Verbal cues  Exercises Exercises: General Upper Extremity, Other exercises General Exercises - Upper Extremity Shoulder  Flexion: PROM, Left, 10 reps, Supine Shoulder ABduction: PROM, Left, 10 reps, Supine Other Exercises Other Exercises: 5 sets of 5 second L eblow propping at the EOB followed by prompting pt to sit upright briefly before attempting again. Mod A to lean to L side but able to balance with CGA for 5 seconds while in L lateral propping position. Max A to return to upright position.                 Pertinent Vitals/ Pain       Pain Assessment Pain Assessment: Faces Faces Pain Scale: Hurts little more Pain Location: B LE and L arm with P/ROM Pain Descriptors / Indicators: Grimacing Pain Intervention(s): Limited activity within patient's tolerance, Monitored during session, Repositioned                                                          Frequency  Min 3X/week        Progress Toward Goals  OT Goals(current goals can now be found in the care plan section)  Progress towards OT goals: Progressing toward goals  Acute Rehab OT Goals Patient Stated Goal: Go to rehab. OT Goal Formulation: With family Time For Goal Achievement: 01/29/24 Potential to Achieve Goals: Fair ADL Goals Pt Will Perform Grooming: with mod assist;sitting Pt Will Perform Upper Body Bathing: with mod assist;sitting Pt Will Perform Upper Body Dressing: with mod assist;sitting Pt Will Perform Lower Body Dressing: with mod assist;with adaptive equipment;bed level Pt Will Transfer to Toilet: with max assist;stand pivot transfer;with mod assist Pt/caregiver will Perform Home Exercise Program: Increased ROM;Increased strength;Both right and left upper extremity;With minimal assist  Plan      Co-evaluation    PT/OT/SLP Co-Evaluation/Treatment: Yes Reason for Co-Treatment: To address functional/ADL transfers;Complexity of the patient's impairments (multi-system involvement)   OT goals addressed during session: ADL's and self-care                          End of Session     OT Visit Diagnosis: Unsteadiness on feet (R26.81);Other abnormalities of gait and mobility (R26.89);Muscle weakness (generalized) (M62.81);Other symptoms and signs involving the nervous system (R29.898);Other symptoms and signs involving cognitive function;Cognitive communication deficit (R41.841);Hemiplegia and hemiparesis Symptoms and signs involving cognitive functions: Cerebral infarction Hemiplegia - Right/Left: Right Hemiplegia - dominant/non-dominant: Dominant Hemiplegia - caused by: Cerebral infarction   Activity Tolerance Patient tolerated treatment well   Patient Left in bed;with call bell/phone within reach;with family/visitor present   Nurse Communication          Time: 9175-9152 OT Time Calculation (min): 23 min  Charges: OT General Charges $OT Visit: 1 Visit OT Treatments $Therapeutic Exercise: 8-22 mins (One unit taken due to co-treat with PT.)  JAYSON PERSON OT, MOT  Jayson Person 01/21/2024, 11:17 AM

## 2024-01-21 NOTE — Progress Notes (Addendum)
 Progress Note   Patient: Connie Shepard FMW:[id number] DOB: [birth year] DOA: 01/14/2024     7 DOS: the patient was seen and examined on 01/21/2024   Brief hospital course: Connie Shepard is a 58 y.o. female with medical history significant for stroke with baseline Right hemiparesis, DM II, CHF, HTN, hypothyroidism. Patient was brought to the ED via EMS with reports of unresponsiveness, right gaze, and aphasia.  Family reported that patient was last known well at about 8 PM last night when she went to bed.  This morning when she woke up she is not feeling well and was not talking much at all, it was about 12:30 PM after she woke up from a nap and I did notice that she was not talking at all.  EMS noted that she was gazing to the right on the following directions. On my evaluation, patient is awake, but gazing to the right and unable to answer questions.   ED Course: BP 219/155.  Heart rate 102-112.  RR 11-22.   Code stroke called.  Patient outside window for thrombolysis.   Head CT-suggested an acute infarct. Subsequent MRI brain - Acute infarcts in the right corona radiata, caudate, lentiform nucleus, and external capsule. Extensive chronic microvascular ischemic changes, generalized parenchymal volume loss, and multiple remote infarcts. CTA head and neck-severe stenosis in several arteries. Tele-neurology was consulted, okay for patient to be admitted here at [facility]   Assessment & Plan:   Principal Problem:   Acute CVA (cerebrovascular accident) [facility]) Active Problems:   Essential hypertension   Diabetes mellitus (HCC)   Hypothyroidism   Chronic combined systolic and diastolic congestive heart failure (HCC)    Assessment and Plan: Acute CVA- POA: History of stroke, with baseline right hemiparesis  - MRI brain-  Acute infarcts in the right corona radiata, caudate, lentiform nucleus, and external capsule. Extensive chronic microvascular ischemic changes, generalized parenchymal volume  loss, and multiple remote infarcts.   - CTA head and neck reviewed stenosis in multiple vessels. - Neurology consulted, recommended: aspirin , Plavix , x 3 months then continue with aspirin  325 daily For now continue with aspirin  and 25 mg rectally- high-dose statin but patient is unable to tolerate p.o.  Remains aphasic, with right-sided hemiplegia, with right upper arm contracted Now moving left upper extremities, minimal left lower extremity Severe dysphagia -status post extensive evaluation by speech advised PEG tube placement. She pulled her NG out several times. Continue gentle IV fluids. Unable to give meds, feeds. She has been  Palliative care following, prognosis remain poor due to recurrent stroke-severe dysphagia. Family wished for DNR, if she declines leaning towards hospice. Awaiting better BP control for IR placement of PEG tube. Subsequent plan to discharge to SNF with PEG tube once she reaches goal rate.  Hypertension- uncontrolled Not controlled with IV hydralazine  to 20mg  q4 prn, continue clonidine patch. Labetolol IV for very high BP, Enalapril scheduled doses. Unable to give PO, as she has no NG tube, failed SLP. Started Cleviprex gtt for goal BP 160/90. She had rash over her chest, arm Unable to give Nifedipine drip due to QT prolong issues. Discussed with pharmacy, started Esmolol drip for BP control. If BP unable to be controlled, she may need to be transferred to [facility] for parenteral nutrition and to obtain PEG tube with IR.   Diabetes mellitus A1c 12/02/2023-8.4.  Continue accucheks, sliding scale q6h Hold home metformin , Lantus  while NPO.   Chronic combined systolic and diastolic CHF stable and compensated.   Last echo  2022 EF 20% with global hypokinesis Repeat 2D Echo: EF estimated 25%, severe decreased LV function, finding consistent with Takotsubo cardiomyopathy.  Severe LVH Gentle hydration as she is NPO. Hold torsemide  40 mg twice daily,  spironolactone .  Thrombocytopenia- Noted sudden drop in platelets AM labs. Repeat CBC with diff ordered.  Ethics: Changed to DNR limited Palliative care consulted, mom and dad present at bedside.  Recurrent stroke, with progressive neurological changes on the new stroke complicated by aphasia, dysphagia-therefore prognosis is poor.  If patient continues to decline may be a candidate for hospice.  Peg tube planned at [facility] with IR, once BP better controlled.  Nursing supportive care. Fall, aspiration precautions. Diet:  Diet Orders (From admission, onward)     Start     Ordered   01/14/24 1329  Diet NPO time specified  Diet effective now       Comments: NPO until stroke swallow screen is complete   01/14/24 1329           DVT prophylaxis: enoxaparin  (LOVENOX ) injection 40 mg Start: 01/14/24 1930  Level of care: Stepdown   Code Status: Limited: Do not attempt resuscitation (DNR) -DNR-LIMITED -Do Not Intubate/DNI   Subjective: Patient is seen and examined today morning.  Last night she got ativan , BP improved. This AM her BP elevated. Discussed with RN, pharmacy started esmolol drip. Awaiting PEG placement at [facility]. Parents at bedside.  Physical Exam: Vitals:   01/21/24 1000 01/21/24 1015 01/21/24 1030 01/21/24 1045  BP: (!) 215/109  (!) 220/105 (!) 179/99  Pulse: 89 91 90 94  Resp: 13 12 15 14   Temp:      TempSrc:      SpO2: 99% 97% 99% 97%  Weight:      Height:        General - Middle aged obese Caucasian female, no apparent distress HEENT - PERRLA, EOMI, atraumatic head, non tender sinuses. Lung -breath sounds, basal rales, rhonchi, no wheezes. Heart - S1, S2 heard, no murmurs, rubs, trace pedal edema. Abdomen - Soft, non tender obese, bowel sounds good. Neuro - sleeping, moving left upper extremity, minimal left lower extremity, does not move left side of her body.  Skin - Warm and dry.  Data Reviewed:      Latest Ref Rng & Units 01/21/2024    5:50 AM  01/14/2024    1:34 PM 01/14/2024    1:31 PM  CBC  WBC 4.0 - 10.5 K/uL 6.6   7.8   Hemoglobin 12.0 - 15.0 g/dL 83.5  80.9  80.5   Hematocrit 36.0 - 46.0 % 51.7  56.0  55.9   Platelets 150 - 400 K/uL 41   216       Latest Ref Rng & Units 01/20/2024    7:49 AM 01/19/2024    6:07 AM 01/18/2024    5:31 AM  BMP  Glucose 70 - 99 mg/dL 867  867  830   BUN 6 - 20 mg/dL 13  13  16    Creatinine 0.44 - 1.00 mg/dL 9.02  9.07  8.96   Sodium 135 - 145 mmol/L 143  142  143   Potassium 3.5 - 5.1 mmol/L 3.2  3.9  3.3   Chloride 98 - 111 mmol/L 107  107  105   CO2 22 - 32 mmol/L 23  23  24    Calcium  8.9 - 10.3 mg/dL 9.3  89.9  9.9    DG Abd Portable 1V Result Date: 01/20/2024 CLINICAL DATA:  [id number] Encounter for nasogastric (NG) tube placement [id number] EXAM: PORTABLE ABDOMEN - 1 VIEW COMPARISON:  X-ray abdomen 01/17/2024 FINDINGS: Enteric tube with tip and side port overlying the expected region of the gastric lumen. The bowel gas pattern is normal. No radio-opaque calculi or other significant radiographic abnormality are seen. IMPRESSION: 1. Enteric tube in good position. 2. Nonobstructive bowel gas pattern. Electronically Signed   By: Morgane  Naveau M.D.   On: 01/20/2024 17:06     Family Communication: Discussed with patient's parents at bedside.  They understand and agree. All questions answered.  Disposition: Status is: Inpatient Remains inpatient appropriate because: PEG tube placement with IR at [facility] once BP controlled and then dc plan once able to tolerate tube feeds at goal rate  Planned Discharge Destination: Skilled nursing facility     Time spent: 54 minutes  Author: Concepcion Riser, MD 01/21/2024 11:12 AM Secure chat 7am to 7pm For on call review [email or web address].

## 2024-01-21 NOTE — Progress Notes (Signed)
 Cleviprex started at 1032 at 2mg /hr. Went back to patient room around 1130 and patient appeared to have a rash on her chest, neck, face, and right arm. No s/sx of respiratory distress. Laymon Pinal, NP w/ Palliative Care Team at bedside she ausculted pt lung sounds. Notified Dr. Darci and Dempsey Blush w/ Pharmacy about reaction. Per orders Cleviprex stopped and benadryl  given.

## 2024-01-21 NOTE — Progress Notes (Signed)
 Daily Progress Note   [name]       Date: 01/21/2024 DOB: 1965-11-14  Age: 58 y.o. MRN#: [id number] Attending Physician: Darci Pore, MD Primary Care Physician: Bobbette Coye LABOR, MD Admit Date: 01/14/2024  Reason for Consultation/Follow-up: Establishing goals of care and Family-Clinician negotiation  Subjective:  58 y.o. female  with past medical history of CHF, ADD, bronchitis, depression, type 2 diabetes, hypertension, obesity, prior CVA, chronic combined systolic and diastolic heart failure, vitamin D  deficiency, and hypothyroidism due to Hashimoto's thyroiditis admitted on 01/14/2024 with right-sided weakness and aphasia.    Workup revealed Acute infarcts in the right corona radiata, caudate, lentiform nucleus, and external capsule.  Also noted to have extensive microvascular ischemic changes and changes consistent with possible component of hypertensive encephalopathy.  Per prior notes, patient has a history of prior strokes.  Deficits include right-sided weakness, aphasia, and dysphagia.   01/15/2024: initial completed, discussed GOC/ACP. Transitioned from full code to DNR/DNI. Family considering transition to comfort care vs feeding tube, time for outcomes.    01/16/2024: further GOC discussion. SNF for rehab with palliative care vs home with hospice support. Family hopeful she will make some improvements so preferred SNF for rehab. However, they are hesitant about feeding tube in the nose due to discomfort and would prefer PEG tube be placed. Care coordination was in process.    01/17/2024: Family confirmed plan to d/c to SNF for skilled therapy and OP Pall follow up with ultimate plan to return home with family support. DNR/DNI. Coordinated with team to get Dobhoff tube placed 10/03 for enteral nutrition  until able to establish PEG. Tentatively planned to get PEG early next week Mon/Tues.  01/20/2024: Interval events: patient pulled out Dobhoff on the evening of 10/03.  Unable to reinsert NG. Med changes made to address hypertension (per IR needed to be <160/90). PMT collaborated with team to assist with care coordination. Risks/benefits PEG discussed. Plan is to proceed with PEG, d/c to SNF for rehab.   Today, Labs independently reviewed. Hemoglobin elevated at 16.4 down from 19 <<19.4 - 01/14/2024). Could be related to hemoconcentration although she is receiving IV fluids. She is NPO. Monitor. Platelet count low. May need recheck as plan is to pursue PEG placement via IR. Vital signs reviewed. BP continues to be significantly elevated. Patient was started on a BP lowering drip.  Otherwise, vital signs appear relatively stable O2 sat stable on room air.  Nursing adjusting dose to address hypertension. EKG independently reviewed. Sinus rhythm. Prolonged QTC at 510 would use caution with Qtc prolonging medication.  Medication administration record reviewed.  No  as needed symptom meds given on 24-hour look back.    Today, independent history obtained from nursing and patient's family as she continues to be severely aphasic.  Per nursing: Patient's blood pressure continues to be elevated.  They are adjusting the drip.  She was somewhat agitated yesterday after being started on the drip but today has been more calm.  Also spoke with patient's parents at bedside.  They share that she has been working with therapy and was able to sit up on the side of the bed.  She was also able to tell her mom that she loves her.  Family feels she is making improvements.  They continue to be hopeful that she will be able to receive a PEG tube and discharged to SNF for skilled therapy where they hope her improvements will continue.  Ultimate plan is to return home.  I did share my concern that in addition to the risk of placing a  PEG patient may attempt to self discontinue as she has removed several enteral tubes that were placed via the nasogastric route.  [name] wish to continue at least a trial of obtaining PEG as they are hopeful she will continue to make improvements at the SNF and they know she needs access to nutrition in order to properly work with therapy.  Chart review/care coordination:  Completed extensive chart review including EPIC notes, attending physician, bedside nursing staff, . Coordinated care with attending physician, TOC, bedside nursing staff, administrative coordinator, and pharmacy.  She was started on Cleviprex drip for hypertension.  However, on assessment appeared to be having a reaction.  See photos uploaded to media for more details.  Collaborated with attending physician.  He will discontinue the drip and transition to another agent.  Pharmacy consulted as well. Confirmed with Neurosurgeon that [facility] transport could manage BP lowering drip for planned roundtrip transport to [facility] for PEG tube placement via IR and then return to [facility].  Length of Stay: 7   Physical Exam Constitutional:      General: She is not in acute distress.    Appearance: She is obese.  Pulmonary:     Effort: Pulmonary effort is normal. No respiratory distress.     Breath sounds: No stridor. No wheezing.  Skin:    General: Skin is warm and dry.     Comments: Increased redness noted to right arm, chest, and face.  No hives noted.  Findings concerning for allergic reaction.  See photos uploaded to media for more details.  Neurological:     Comments: Drowsy but arouses to verbal stimuli.  Will open eyes and look at examiner but does not answer questions or follow commands.  Able to move left side purposefully             Vital Signs: BP (!) 179/99   Pulse 94   Temp 98.3 F (36.8 C) (Axillary)   Resp 14   Ht 5' 5 (1.651 m)   Wt 85.7 kg   SpO2 97%   BMI 31.44 kg/m  SpO2:  SpO2: 97 % O2 Device: O2 Device: Room Air O2 Flow Rate:        Palliative Assessment/Data: 10 to 20%   Palliative Care Assessment & Plan   Patient Profile/Assessment:  58 year old female with complex medical history including prior stroke who was admitted to the hospital on 01/14/2024 and diagnosed with recurrent stroke.  Patient with profound deficits.  At baseline, she was bedbound/total care for all  ADLs due to right sided hemiparesis from prior stroke.  Now globally aphasic and with severe dysphagia that requires n.p.o. status.  Initial completed 01/15/2024.  She was transition from full code to DNR/DNI.  Discussion regarding transition to comfort care versus placement of PEG tube for SNF with outpatient palliative follow-up. Ultimately family decides to proceed with PEG tube, STR in SNF, and OP Pall follow up. Dobbhoff tube for enteral nutrition was placed on 01/17/2024, unfortunately, patient self discontinued that evening.  Unable to reinsert NG tube for enteral access.  Plan to wait until patient is able to have PEG established 10/6 or 10/7.  Unfortunately, due to elevated blood pressure readings unable to have PEG placed on 10/6.  Nursing attempted to insert NG tube but was self discontinued by patient immediately after placement.  Collaborating with attending team, interventional radiology, and pharmacy to adjust blood pressure regimen to achieve goal blood pressure of less than 160/90 in order to proceed with PEG.  Blood pressure drip started on 01/21/2024.  Unfortunately patient experienced an allergic reaction.  Transitioned to a different blood pressure drip.  Blood pressure remains too elevated for procedure at present.  Nursing to titrate drip to achieve goal blood pressure of less than 160/90.  Plan continues to be to proceed with PEG placement once medically stable.  Symptoms appear stable on current regimen.  No changes to symptom regimen.  Palliative team will continue to support  holistically.     Recommendations/Plan:  DNR/DNI Continue to treat the treatable, time for outcomes Appears comfortable at present, continue current symptom regimen Need for enteral access for nutrition, hydration, and meds.  Dobbhoff tube placed on 10/3 and self discontinued.  Unable to insert NG tube despite multiple attempts.  Plan is for PEG. Collaborate with attending team to adjust medication regimen to achieve target blood pressure readings in order to proceed with PEG Continue PT/OT/ST while inpatient D/c to SNF for short term rehab Outpatient palliative referral  Palliative medicine team will continue to follow for ongoing goals of care discussion, symptom management, and coordination of care.   Symptom management:  Continue hydromorphone  0.5 mg IV every 2 hours as needed (none needed on 24 hr look back) Continue Tylenol  PR as needed Continue Compazine  10 mg IV every 6 hours as needed Lorazepam  0.5 mg IV every 4 hrs as needed (1 dose used on 24-hour look back)  Prognosis: Guarded    Discharge Planning: Skilled Nursing Facility for rehab with Palliative care service follow-up    Detailed review of medical records (labs, imaging, vital signs), medically appropriate exam, discussed with treatment team, counseling and education to patient, family, & staff, documenting clinical information, medication management, coordination of care   Billing based on MDM: High  Problems Addressed: One acute or chronic illness or injury that poses a threat to life or bodily function  Amount and/or Complexity of Data: Category 1:Assessment requiring an independent historian(s), Category 2:Independent interpretation of a test performed by another physician/other qualified health care professional (not separately reported), and Category 3:Discussion of management or test interpretation with external physician/other qualified health care professional/appropriate source (not separately  reported)  Risks: n/a         Connie CHRISTELLA Pinal, NP  Palliative Medicine Team Team phone # [phone]  Thank you for allowing the Palliative Medicine Team to assist in the care of this patient. Please utilize secure chat with additional questions, if there is no response within 30 minutes please call the above phone number.  Palliative Medicine Team providers are available by phone from 7am to 7pm daily and can be reached through the team cell phone.  Should this patient require assistance outside of these hours, please call the patient's attending physician.

## 2024-01-21 NOTE — Plan of Care (Signed)
  Problem: Clinical Measurements: Goal: Respiratory complications will improve Outcome: Progressing   Problem: Coping: Goal: Level of anxiety will decrease Outcome: Progressing   Problem: Elimination: Goal: Will not experience complications related to urinary retention Outcome: Progressing   Problem: Ischemic Stroke/TIA Tissue Perfusion: Goal: Complications of ischemic stroke/TIA will be minimized Outcome: Progressing   Problem: Activity: Goal: Risk for activity intolerance will decrease Outcome: Not Progressing   Problem: Nutrition: Goal: Adequate nutrition will be maintained Outcome: Not Progressing

## 2024-01-21 NOTE — TOC Progression Note (Signed)
 Transition of Care [facility]) - Progression Note    Patient Details  Name: Connie Shepard MRN: [id number] Date of Birth: [birth year]  Transition of Care [facility]) CM/SW Contact  Mcarthur Saddie Kim, [place] Phone Number: 01/21/2024, 12:44 PM  Clinical Narrative: LCSW updated [facility]. Aware pt is awaiting PEG placement when blood pressure is stable. Will start auth closer to d/c. TOC will continue to follow.       Expected Discharge Plan: Skilled Nursing Facility Barriers to Discharge: Continued Medical Work up               Expected Discharge Plan and Services In-house Referral: Clinical Social Work   Post Acute Care Choice: Skilled Nursing Facility Living arrangements for the past 2 months: Single Family Home                                       Social Drivers of Health (SDOH) Interventions SDOH Screenings   Food Insecurity: No Food Insecurity (01/14/2024)  Housing: Low Risk  (01/14/2024)  Transportation Needs: No Transportation Needs (01/14/2024)  Utilities: Not At Risk (01/14/2024)  Depression (PHQ2-9): Medium Risk (04/30/2018)  Financial Resource Strain: Low Risk  (05/07/2023)   Received from [facility]  Physical Activity: Unknown (05/07/2023)   Received from [facility]  Social Connections: Somewhat Isolated (05/07/2023)   Received from [facility]  Stress: No Stress Concern Present (05/07/2023)   Received from [facility]  Tobacco Use: Low Risk  (01/17/2024)  Health Literacy: Medium Risk (01/10/2021)   Received from [facility]    Readmission Risk Interventions     No data to display

## 2024-01-21 NOTE — Progress Notes (Signed)
 Physical Therapy Treatment Patient Details Name: Connie Shepard MRN: [id number] DOB: [birth year] Today's Date: 01/21/2024   History of Present Illness Connie Shepard is a 58 y.o. female with medical history significant for stroke with baseline right hemiparesis, diabetes mellitus, CHF, hypertension, hypothyroidism.  Patient was brought to the ED via EMS with reports of unresponsiveness, right gaze, and aphasia.  Family reported that patient was last known well at about 8 PM last night when she went to bed.  This morning when she woke up she is not feeling well and was not talking much at all, it was about 12:30 PM after she woke up from a nap and I did notice that she was not talking at all.  EMS noted that she was gazing to the right on the following directions.  On my evaluation, patient is awake, but gazing to the right and unable to answer questions.    PT Comments  Patient presents lethargic, but became more responsive when receiving active assisted and passive ROM to extremities and able to keep eyes open and followed some directions. Patient required Max assist for sitting up at bedside, had frequent posterior and left lateral leaning due to weakness, able to keep trunk in midline for up to 20-30 seconds with verbal/tactile cueing, unable to attempt standing due to elevated BP and put back to bed with Max assist demonstrating some use of LUE and LLE to help reposition self. Patient will benefit from continued skilled physical therapy in hospital and recommended venue below to increase strength, balance, endurance for safe ADLs and gait.    If plan is discharge home, recommend the following: A lot of help with bathing/dressing/bathroom;Help with stairs or ramp for entrance;A lot of help with walking and/or transfers;Assistance with cooking/housework;Assist for transportation   Can travel by private vehicle     No  Equipment Recommendations  None recommended by PT    Recommendations for Other  Services       Precautions / Restrictions Precautions Precautions: Fall Recall of Precautions/Restrictions: Impaired Restrictions Weight Bearing Restrictions Per Provider Order: No     Mobility  Bed Mobility Overal bed mobility: Needs Assistance Bed Mobility: Supine to Sit, Sit to Supine Rolling: Max assist, Used rails   Supine to sit: Max assist, HOB elevated, Total assist Sit to supine: Max assist, Total assist   General bed mobility comments: slow labored movement    Transfers                        Ambulation/Gait                   Stairs             Wheelchair Mobility     Tilt Bed    Modified Rankin (Stroke Patients Only)       Balance Overall balance assessment: Needs assistance Sitting-balance support: Feet supported, Single extremity supported Sitting balance-Leahy Scale: Poor Sitting balance - Comments: frequent leaning backwards and to the left Postural control: Posterior lean, Left lateral lean                                  Communication Communication Communication: Impaired Factors Affecting Communication: Difficulty expressing self  Cognition Arousal: Alert Behavior During Therapy: Flat affect  Following commands: Impaired Following commands impaired: Follows one step commands inconsistently    Cueing Cueing Techniques: Gestural cues, Tactile cues, Verbal cues  Exercises General Exercises - Lower Extremity Long Arc Quad: Seated, AAROM, Strengthening, Both, 10 reps Other Exercises Other Exercises: Active assisted left hip, knee, ankle flexion/extension x 10 reps, Passive ROM to right hip, knees, ankles x 10 reps each    General Comments        Pertinent Vitals/Pain Pain Assessment Pain Assessment: Faces Faces Pain Scale: Hurts little more Pain Location: B LE and L arm with P/ROM Pain Descriptors / Indicators: Grimacing Pain Intervention(s): Limited  activity within patient's tolerance, Monitored during session, Repositioned    Home Living                          Prior Function            PT Goals (current goals can now be found in the care plan section) Acute Rehab PT Goals Patient Stated Goal: not stated. Family wants patient to return home after rehab PT Goal Formulation: With patient/family Time For Goal Achievement: 01/29/24 Potential to Achieve Goals: Fair Progress towards PT goals: Progressing toward goals    Frequency    Min 3X/week      PT Plan      Co-evaluation PT/OT/SLP Co-Evaluation/Treatment: Yes Reason for Co-Treatment: To address functional/ADL transfers;Complexity of the patient's impairments (multi-system involvement) PT goals addressed during session: Mobility/safety with mobility;Balance OT goals addressed during session: ADL's and self-care      AM-PAC PT 6 Clicks Mobility   Outcome Measure  Help needed turning from your back to your side while in a flat bed without using bedrails?: A Lot Help needed moving from lying on your back to sitting on the side of a flat bed without using bedrails?: A Lot Help needed moving to and from a bed to a chair (including a wheelchair)?: A Lot Help needed standing up from a chair using your arms (e.g., wheelchair or bedside chair)?: Total Help needed to walk in hospital room?: Total Help needed climbing 3-5 steps with a railing? : Total 6 Click Score: 9    End of Session   Activity Tolerance: Patient tolerated treatment well;Patient limited by fatigue Patient left: in bed;with call bell/phone within reach;with family/visitor present Nurse Communication: Mobility status PT Visit Diagnosis: Unsteadiness on feet (R26.81);Other abnormalities of gait and mobility (R26.89);Muscle weakness (generalized) (M62.81)     Time: 9182-9154 PT Time Calculation (min) (ACUTE ONLY): 28 min  Charges:    $Therapeutic Exercise: 8-22 mins $Therapeutic  Activity: 8-22 mins PT General Charges $$ ACUTE PT VISIT: 1 Visit                     1:44 PM, 01/21/24 Lynwood Music, MPT Physical Therapist with [facility] [phone] office [phone] mobile phone

## 2024-01-22 ENCOUNTER — Inpatient Hospital Stay (HOSPITAL_COMMUNITY)

## 2024-01-22 ENCOUNTER — Other Ambulatory Visit: Payer: Self-pay

## 2024-01-22 DIAGNOSIS — Z558 Other problems related to education and literacy: Secondary | ICD-10-CM | POA: Diagnosis not present

## 2024-01-22 DIAGNOSIS — Z79899 Other long term (current) drug therapy: Secondary | ICD-10-CM | POA: Diagnosis not present

## 2024-01-22 DIAGNOSIS — Z7189 Other specified counseling: Secondary | ICD-10-CM | POA: Diagnosis not present

## 2024-01-22 DIAGNOSIS — I10 Essential (primary) hypertension: Secondary | ICD-10-CM | POA: Diagnosis not present

## 2024-01-22 DIAGNOSIS — E1369 Other specified diabetes mellitus with other specified complication: Secondary | ICD-10-CM | POA: Diagnosis not present

## 2024-01-22 DIAGNOSIS — I639 Cerebral infarction, unspecified: Secondary | ICD-10-CM | POA: Diagnosis not present

## 2024-01-22 DIAGNOSIS — E669 Obesity, unspecified: Secondary | ICD-10-CM

## 2024-01-22 DIAGNOSIS — Z515 Encounter for palliative care: Secondary | ICD-10-CM | POA: Diagnosis not present

## 2024-01-22 DIAGNOSIS — R1312 Dysphagia, oropharyngeal phase: Secondary | ICD-10-CM

## 2024-01-22 LAB — GLUCOSE, CAPILLARY
Glucose-Capillary: 104 mg/dL — ABNORMAL HIGH (ref 70–99)
Glucose-Capillary: 105 mg/dL — ABNORMAL HIGH (ref 70–99)
Glucose-Capillary: 107 mg/dL — ABNORMAL HIGH (ref 70–99)
Glucose-Capillary: 116 mg/dL — ABNORMAL HIGH (ref 70–99)
Glucose-Capillary: 119 mg/dL — ABNORMAL HIGH (ref 70–99)
Glucose-Capillary: 99 mg/dL (ref 70–99)

## 2024-01-22 NOTE — Progress Notes (Signed)
 Interventional Radiology Brief Note:  BP improved with medical management on IV drips.  Recommend continue to work towards BP stability with complete wean from titratable drips prior to transporting to [facility] for procedure.   Tonnie Stillman, MS RD PA-C

## 2024-01-22 NOTE — Progress Notes (Signed)
 Progress Note   Patient: Connie Shepard FMW:[id number] DOB: Dec 31, 1965 DOA: 01/14/2024     8 DOS: the patient was seen and examined on 01/22/2024   Brief hospital course: Connie Shepard is a 58 y.o. female with medical history significant for stroke with baseline Right hemiparesis, DM II, CHF, HTN, hypothyroidism. Patient was brought to the ED via EMS with reports of unresponsiveness, right gaze, and aphasia.  Family reported that patient was last known well at about 8 PM last night when she went to bed.  This morning when she woke up she is not feeling well and was not talking much at all, it was about 12:30 PM after she woke up from a nap and I did notice that she was not talking at all.  EMS noted that she was gazing to the right on the following directions. On my evaluation, patient is awake, but gazing to the right and unable to answer questions.   ED Course: BP 219/155.  Heart rate 102-112.  RR 11-22.   Code stroke called.  Patient outside window for thrombolysis.   Head CT-suggested an acute infarct. Subsequent MRI brain - Acute infarcts in the right corona radiata, caudate, lentiform nucleus, and external capsule. Extensive chronic microvascular ischemic changes, generalized parenchymal volume loss, and multiple remote infarcts. CTA head and neck-severe stenosis in several arteries. Tele-neurology was consulted, okay for patient to be admitted here at [facility].   Assessment & Plan:   Principal Problem:   Acute CVA (cerebrovascular accident) [facility]) Active Problems:   Essential hypertension   Diabetes mellitus (HCC)   Hypothyroidism   Chronic combined systolic and diastolic congestive heart failure (HCC)    Assessment and Plan: Acute CVA- POA: History of stroke, with baseline right hemiparesis  - MRI brain-  Acute infarcts in the right corona radiata, caudate, lentiform nucleus, and external capsule. Extensive chronic microvascular ischemic changes, generalized parenchymal volume  loss, and multiple remote infarcts.   - CTA head and neck reviewed stenosis in multiple vessels. - Neurology consulted, recommended: aspirin , Plavix , x 3 months then continue with aspirin  325 daily For now continue with aspirin  and 25 mg rectally- high-dose statin but patient is unable to tolerate p.o.  Remains aphasic, with right-sided hemiplegia, with right upper arm contracted Now moving left upper extremities, minimal left lower extremity Severe dysphagia -status post extensive evaluation by speech advised PEG tube placement. She pulled her NG out several times. Continue gentle IV fluids. Unable to give meds, feeds.  Palliative care following, prognosis remain poor due to recurrent stroke-severe dysphagia. Family wished for DNR, if she declines leaning towards hospice. Plan to restrain her for the above placement for meds, feeds. Awaiting better BP control for IR placement of PEG tube. Subsequent plan to discharge to SNF with PEG tube once she reaches goal rate.  Hypertension- uncontrolled Not controlled with IV hydralazine  to 20mg  q4 prn, continue clonidine patch. Labetolol IV for very high BP, Enalapril scheduled doses. Unable to give PO, as she has no NG tube, failed SLP. Unable to give Nifedipine drip due to QT prolong issues. Started Cleviprex gtt for goal BP 160/90. She had rash over her chest, arm 01/21/2024. Continue esmolol drip as per pharmacy protocol with goal BP less than 160/90. IR unable to take her on esmolol drip, no ICU bed available at [facility] at this time.  Discussed with PCCM who advised no PICC line or no TPN.  Will restrain her for Dobbhoff and continue oral medications, feeds.  I discussed with  patient's mother who agreed for restraints.  RN notified.   Diabetes mellitus A1c 12/02/2023-8.4.  Sugars stable. Continue accucheks, sliding scale q6h Hold home metformin , Lantus  while NPO.   Chronic combined systolic and diastolic CHF stable and compensated.   Last echo 2022  EF 20% with global hypokinesis Repeat 2D Echo: EF estimated 25%, severe decreased LV function, finding consistent with Takotsubo cardiomyopathy.  Severe LVH Gentle hydration to be stopped once feeds started. Hold torsemide  40 mg twice daily, spironolactone .  Thrombocytopenia- Low platelet due to clot in the specimen, repeat one 188.  Ethics: Changed to DNR limited Palliative care consulted, mom and dad present at bedside.  Recurrent stroke, with progressive neurological changes on the new stroke complicated by aphasia, dysphagia-therefore prognosis is poor.  If patient continues to decline family may consider hospice.  Peg tube planned at [facility] with IR, once BP better controlled.  Nursing supportive care. Fall, aspiration precautions. Diet:  Diet Orders (From admission, onward)     Start     Ordered   01/14/24 1329  Diet NPO time specified  Diet effective now       Comments: NPO until stroke swallow screen is complete   01/14/24 1329           DVT prophylaxis:   Level of care: ICU   Code Status: Limited: Do not attempt resuscitation (DNR) -DNR-LIMITED -Do Not Intubate/DNI   Subjective: Patient is seen and examined today morning.  BP improved on esmolol drip. Plan to restraint her for NG/ OG for meds, feeds. Awaiting PEG placement at [facility]. Parents at bedside. Cant transfer her on drips.  Physical Exam: Vitals:   01/22/24 0800 01/22/24 0830 01/22/24 0900 01/22/24 1121  BP: (!) 165/77 (!) 141/93 134/81   Pulse: 70 68 67   Resp: 14 20 15    Temp:    97.6 F (36.4 C)  TempSrc:    Axillary  SpO2: 98%  94%   Weight:      Height:        General - Middle aged obese Caucasian female, sleeping, no apparent distress HEENT - PERRLA, EOMI, atraumatic head, non tender sinuses. Lung -breath sounds, basal rales, rhonchi, no wheezes. Heart - S1, S2 heard, no murmurs, rubs, trace pedal edema. Abdomen - Soft, non tender obese, bowel sounds good. Neuro - sleeping, right  hemiparesis, moving left arm. Unable to do full neuro exam. Skin - Warm and dry.  Data Reviewed:      Latest Ref Rng & Units 01/21/2024    5:30 PM 01/21/2024    5:50 AM 01/14/2024    1:34 PM  CBC  WBC 4.0 - 10.5 K/uL 8.9  6.6    Hemoglobin 12.0 - 15.0 g/dL 83.2  83.5  80.9   Hematocrit 36.0 - 46.0 % 49.6  51.7  56.0   Platelets 150 - 400 K/uL 188  41  C     C Corrected result      Latest Ref Rng & Units 01/20/2024    7:49 AM 01/19/2024    6:07 AM 01/18/2024    5:31 AM  BMP  Glucose 70 - 99 mg/dL 867  867  830   BUN 6 - 20 mg/dL 13  13  16    Creatinine 0.44 - 1.00 mg/dL 9.02  9.07  8.96   Sodium 135 - 145 mmol/L 143  142  143   Potassium 3.5 - 5.1 mmol/L 3.2  3.9  3.3   Chloride 98 - 111 mmol/L 107  107  105   CO2 22 - 32 mmol/L 23  23  24    Calcium  8.9 - 10.3 mg/dL 9.3  89.9  9.9    US  EKG SITE RITE Result Date: 01/22/2024 If Site Rite image not attached, placement could not be confirmed due to current cardiac rhythm.  DG Abd Portable 1V Result Date: 01/20/2024 CLINICAL DATA:  [id number] Encounter for nasogastric (NG) tube placement [id number] EXAM: PORTABLE ABDOMEN - 1 VIEW COMPARISON:  X-ray abdomen 01/17/2024 FINDINGS: Enteric tube with tip and side port overlying the expected region of the gastric lumen. The bowel gas pattern is normal. No radio-opaque calculi or other significant radiographic abnormality are seen. IMPRESSION: 1. Enteric tube in good position. 2. Nonobstructive bowel gas pattern. Electronically Signed   By: Morgane  Naveau M.D.   On: 01/20/2024 17:06     Family Communication: Discussed with patient's parents at bedside.  They understand and agree. All questions answered.  Disposition: Status is: Inpatient Remains inpatient appropriate because: unable to transfer her to [facility] on drip. Plan to restraint for dobhoff for feeds, meds. PEG tube placement with IR at [facility] once BP controlled and then dc plan once able to tolerate tube feeds at goal rate  Planned Discharge  Destination: Skilled nursing facility     Time spent: 56 minutes  Author: Concepcion Riser, MD 01/22/2024 11:39 AM Secure chat 7am to 7pm For on call review [email or web address].

## 2024-01-22 NOTE — Progress Notes (Signed)
 Daily Progress Note   [name]       Date: 01/22/2024 DOB: 1966-01-14  Age: 58 y.o. MRN#: [id number] Attending Physician: Darci Pore, MD Primary Care Physician: Bobbette Coye LABOR, MD Admit Date: 01/14/2024  Reason for Consultation/Follow-up: Disposition, Establishing goals of care, and Family-Clinician negotiation  Subjective:  58 y.o. female  with past medical history of CHF, ADD, bronchitis, depression, type 2 diabetes, hypertension, obesity, prior CVA, chronic combined systolic and diastolic heart failure, vitamin D  deficiency, and hypothyroidism due to Hashimoto's thyroiditis admitted on 01/14/2024 with right-sided weakness and aphasia.    Workup revealed Acute infarcts in the right corona radiata, caudate, lentiform nucleus, and external capsule.  Also noted to have extensive microvascular ischemic changes and changes consistent with possible component of hypertensive encephalopathy.  Per prior notes, patient has a history of prior strokes.  Deficits include right-sided weakness, aphasia, and dysphagia.   01/15/2024: initial completed, discussed GOC/ACP. Transitioned from full code to DNR/DNI. Family considering transition to comfort care vs feeding tube, time for outcomes.    01/16/2024: further GOC discussion. SNF for rehab with palliative care vs home with hospice support. Family hopeful she will make some improvements so preferred SNF for rehab. However, they are hesitant about feeding tube in the nose due to discomfort and would prefer PEG tube be placed. Care coordination was in process.    01/17/2024: Family confirmed plan to d/c to SNF for skilled therapy and OP Pall follow up with ultimate plan to return home with family support. DNR/DNI. Coordinated with team to get Dobhoff tube placed 10/03 for enteral  nutrition until able to establish PEG. Tentatively planned to get PEG early next week Mon/Tues.   01/20/2024: Interval events: patient pulled out Dobhoff on the evening of 10/03.  Unable to reinsert NG. Med changes made to address hypertension (per IR needed to be <160/90). PMT collaborated with team to assist with care coordination. Risks/benefits PEG discussed. Plan is to proceed with PEG, d/c to SNF for rehab.   01/21/2024: follow up. BP too elevated to place PEG. Collaborated with attending, pharmacy, and bedside nursing staff to assist with medication adjustment. Updated family. They note some improvements with therapy and wish to continue with plan for d/c to SNF.   Today, vital signs reviewed and BP levels appear to have improved. Unfortunately, per IR they cannot take patient while she is on a BP lowering drip. Medication administration record reviewed. Required total of 1 mg of IV Ativan  on 24 hr look back (2 doses). Required total of 0.5 mg IV Dilaudid  on 24 hr look back.   Independent  history obtained from nursing staff as patient is severely aphasic therefore unable to provide any history.  Per nursing, patient's blood pressure has improved with IV drip.  Discussed need for nutritional support until transfer.  Nursing will reattempt NG tube. She has repeatedly pulled out NG tubes in the past. Tolerates Ativan  well although with only partial response.   Chart review/care coordination:  Completed extensive chart review including EPIC notes, MAR, vital signs. Coordinated care with attending physician, bedside nursing staff, and TOC.     Plan was initially to transfer to [facility] for PEG tube placement via IR.  Unfortunately unable to accommodate due to presence of IV blood pressure lowering drip.  Tentative plan was then to transfer to [facility] where she would remain until medically stable for PEG tube placement.  However, no beds available.  Nursing will reattempt placement of NG tube.   Will attempt to transition to blood pressure meds via NG tube and once able to discontinue blood pressure drip will reattempt transfer.  Also called and attempted to call and update patient's parents. No answer. Unable to leave a VM.    Length of Stay: 8   Physical Exam Constitutional:      General: She is not in acute distress.    Appearance: She is obese.     Comments: Chronically ill appearing  HENT:     Mouth/Throat:     Mouth: Mucous membranes are dry.  Pulmonary:     Effort: Pulmonary effort is normal. No respiratory distress.  Skin:    General: Skin is warm and dry.  Neurological:     Comments: Non-verbal today, lethargic, not following commands             Vital Signs: BP 134/81   Pulse 67   Temp (!) 97.4 F (36.3 C) (Axillary)   Resp 15   Ht 5' 5 (1.651 m)   Wt 85 kg   SpO2 94%   BMI 31.18 kg/m  SpO2: SpO2: 94 % O2 Device: O2 Device: Room Air O2 Flow Rate:        Palliative Assessment/Data: 10-20%   Palliative Care Assessment & Plan   Patient Profile/Assessment:  58 year old female with complex medical history including prior stroke who was admitted to the hospital on 01/14/2024 and diagnosed with recurrent stroke.  Patient with profound deficits.  At baseline, she was bedbound/total care for all ADLs due to right sided hemiparesis from prior stroke.  Now globally aphasic and with severe dysphagia that requires n.p.o. status.  Initial completed 01/15/2024.  She was transition from full code to DNR/DNI.  Discussion regarding transition to comfort care versus placement of PEG tube for SNF with outpatient palliative follow-up. Ultimately family decides to proceed with PEG tube, STR in SNF, and OP Pall follow up. Dobbhoff tube for enteral nutrition was placed on 01/17/2024, unfortunately, patient self discontinued that evening.  Unable to reinsert NG tube for enteral access.  Plan to wait until patient is able to have PEG established 10/6 or 10/7.  Unfortunately, due  to elevated blood pressure readings unable to have PEG placed on 10/6.  Nursing attempted to insert NG tube but was self discontinued by patient immediately after placement.  Collaborating with attending team, interventional radiology, and pharmacy to adjust blood pressure regimen to achieve goal blood pressure of less than 160/90 in order to proceed with PEG.  Blood pressure drip started on 01/21/2024.  Unfortunately patient experienced an allergic reaction.  Transitioned to a different blood  pressure drip.  BP remained too elevated to proceed with PEG on 01/21/2024. Nursing to titrate drip to achieve goal blood pressure of less than 160/90.  Plan continues to be to proceed with PEG placement once medically stable. BP improved to goal on 01/22/2024 however, unable to complete PEG as per IR they are unable to perform PEG tube while on BP drip. Nursing will attempt to place NG tube temporarily and attempt to wean off BP drip with meds via NG and provide nutritional support until she can undergo PEG tube. Palliative team will continue to support holistically.     Recommendations/Plan:  DNR/DNI Continue to treat the treatable, time for outcomes Will increase Ativan  for agitation/anxiety Need for enteral access for nutrition, hydration, and meds.  Dobbhoff tube placed on 10/3 and self discontinued that evening.  Unable to insert NG tube despite multiple attempts and or patient self discontinues immediately after it is placed, nursing to re-attempt today.  Ultimate plan is for PEG. Transfer to [facility] when able for PEG can be placed (either when BP drip able to be discontinued or bed available) Continue PT/OT/ST while inpatient D/c to SNF for short term rehab Outpatient palliative referral  Palliative medicine team will continue to follow for ongoing goals of care discussion, symptom management, and coordination of care.   Symptom management:  Continue hydromorphone  0.5 mg IV every 2 hours as needed (none  needed on 24 hr look back) Continue Tylenol  PR as needed Continue Compazine  10 mg IV every 6 hours as needed Increase Lorazepam  from 0.5 mg to 1 mg IV every 4 hrs as needed   Prognosis:  Guarded    Discharge Planning: Skilled Nursing Facility for rehab with Palliative care service follow-up    Detailed review of medical records (labs, imaging, vital signs), medically appropriate exam, discussed with treatment team, counseling and education to patient, family, & staff, documenting clinical information, medication management, coordination of care    Billing based on MDM: High  Problems Addressed: One acute or chronic illness or injury that poses a threat to life or bodily function  Amount and/or Complexity of Data: Category 1:Assessment requiring an independent historian(s) and Category 3:Discussion of management or test interpretation with external physician/other qualified health care professional/appropriate source (not separately reported)  Risks: Parenteral controlled substances         Laymon CHRISTELLA Pinal, NP  Palliative Medicine Team Team phone # [phone]  Thank you for allowing the Palliative Medicine Team to assist in the care of this patient. Please utilize secure chat with additional questions, if there is no response within 30 minutes please call the above phone number.  Palliative Medicine Team providers are available by phone from 7am to 7pm daily and can be reached through the team cell phone.  Should this patient require assistance outside of these hours, please call the patient's attending physician.

## 2024-01-22 NOTE — Progress Notes (Signed)
 Nutrition Follow-up  DOCUMENTATION CODES:   Obesity unspecified  INTERVENTION:   -Once able to obtain feeding access:   Initiate Osmolite 1.5 @ 25 ml/hr and increase by 10 ml every 12 hours to goal rate of 45 ml/hr.    60 ml Prosource TF20 daily   Tube feeding regimen provides 1700 kcal (100% of needs), 88 grams of protein, and 823 ml of H2O.     -Monitor Mg, K, and Phos and replete as needed secondary to high refeeding risk -MVI with minerals daily via tube -100 mg thiamine daily x 7 days  NUTRITION DIAGNOSIS:   Inadequate oral intake related to inability to eat as evidenced by NPO status.  Ongoing  GOAL:   Patient will meet greater than or equal to 90% of their needs  Un,et  MONITOR:   Diet advancement  REASON FOR ASSESSMENT:   Consult New TPN/TNA  ASSESSMENT:   Pt with medical history significant for stroke with baseline right hemiparesis, diabetes mellitus, CHF, hypertension, hypothyroidism. Pt admitted with with reports of unresponsiveness, right gaze, and aphasia.  10/1- s/p BSE- NPO 10/2- s/p BSE- NPO 10/3- s/p BSE- NPO, NGT placed via fluoroscopy 10/4- NGT removed by pt, s/p BSE- NPO 10/6- NGT replaced by RN, but immediately pulled out by pt 10/7- s/p BSE- NPO  Reviewed I/O's: -420 ml x 24 hours and +2.3 L since admission  Case discussed with pharmacy and MD. Initial plan was to start TPN due to lack of feeding access, however, PCCM recommending no PICC or TPN. Will reattempt NGT once pt is restrained.   Case discussed during interdisciplinary rounds. She currently has no feeding access and has pulled NGT x 2. Initial plan was to send to [facility] for PEG placement, however, this is unable to be accomplished as IR nurses unable to manage esmolol drip during procedure. MD recommend transfer to [facility] for cortak and PG placement.   She remains at high refeeding risk due to lack of consistent nutrition this admission.   Palliative care following for goals of  care.   Medications reviewed and include thiamine and aldactone .   Labs reviewed: CBGS: 96-116 (inpatient orders for glycemic control are 0-9 units insulin  aspart every 6 hours).    Diet Order:   Diet Order             Diet NPO time specified  Diet effective now                   EDUCATION NEEDS:   No education needs have been identified at this time  Skin:  Skin Assessment: Reviewed RN Assessment  Last BM:  01/16/24 (type 6)  Height:   Ht Readings from Last 1 Encounters:  01/14/24 5' 5 (1.651 m)    Weight:   Wt Readings from Last 1 Encounters:  01/22/24 85 kg    Ideal Body Weight:  56.8 kg  BMI:  Body mass index is 31.18 kg/m.  Estimated Nutritional Needs:   Kcal:  1700-1900  Protein:  85-100 grams  Fluid:  1.7-1.9 L    Margery ORN, RD, LDN, CDCES Registered Dietitian III Certified Diabetes Care and Education Specialist If unable to reach this RD, please use RD Inpatient group chat on secure chat between hours of 8am-4 pm daily

## 2024-01-23 DIAGNOSIS — Z515 Encounter for palliative care: Secondary | ICD-10-CM | POA: Diagnosis not present

## 2024-01-23 DIAGNOSIS — E1369 Other specified diabetes mellitus with other specified complication: Secondary | ICD-10-CM | POA: Diagnosis not present

## 2024-01-23 DIAGNOSIS — I10 Essential (primary) hypertension: Secondary | ICD-10-CM | POA: Diagnosis not present

## 2024-01-23 DIAGNOSIS — Z7189 Other specified counseling: Secondary | ICD-10-CM | POA: Diagnosis not present

## 2024-01-23 DIAGNOSIS — Z558 Other problems related to education and literacy: Secondary | ICD-10-CM | POA: Diagnosis not present

## 2024-01-23 DIAGNOSIS — Z79899 Other long term (current) drug therapy: Secondary | ICD-10-CM | POA: Diagnosis not present

## 2024-01-23 DIAGNOSIS — I639 Cerebral infarction, unspecified: Secondary | ICD-10-CM | POA: Diagnosis not present

## 2024-01-23 LAB — GLUCOSE, CAPILLARY
Glucose-Capillary: 101 mg/dL — ABNORMAL HIGH (ref 70–99)
Glucose-Capillary: 105 mg/dL — ABNORMAL HIGH (ref 70–99)
Glucose-Capillary: 108 mg/dL — ABNORMAL HIGH (ref 70–99)
Glucose-Capillary: 111 mg/dL — ABNORMAL HIGH (ref 70–99)
Glucose-Capillary: 111 mg/dL — ABNORMAL HIGH (ref 70–99)
Glucose-Capillary: 124 mg/dL — ABNORMAL HIGH (ref 70–99)
Glucose-Capillary: 155 mg/dL — ABNORMAL HIGH (ref 70–99)

## 2024-01-23 LAB — CBC
HCT: 47.2 % — ABNORMAL HIGH (ref 36.0–46.0)
Hemoglobin: 15.7 g/dL — ABNORMAL HIGH (ref 12.0–15.0)
MCH: 31.4 pg (ref 26.0–34.0)
MCHC: 33.3 g/dL (ref 30.0–36.0)
MCV: 94.4 fL (ref 80.0–100.0)
Platelets: 186 K/uL (ref 150–400)
RBC: 5 MIL/uL (ref 3.87–5.11)
RDW: 14.1 % (ref 11.5–15.5)
WBC: 7.2 K/uL (ref 4.0–10.5)
nRBC: 0 % (ref 0.0–0.2)

## 2024-01-23 LAB — COMPREHENSIVE METABOLIC PANEL WITH GFR
ALT: 9 U/L (ref 0–44)
AST: 11 U/L — ABNORMAL LOW (ref 15–41)
Albumin: 3.4 g/dL — ABNORMAL LOW (ref 3.5–5.0)
Alkaline Phosphatase: 81 U/L (ref 38–126)
Anion gap: 14 (ref 5–15)
BUN: 19 mg/dL (ref 6–20)
CO2: 20 mmol/L — ABNORMAL LOW (ref 22–32)
Calcium: 9.4 mg/dL (ref 8.9–10.3)
Chloride: 105 mmol/L (ref 98–111)
Creatinine, Ser: 1.2 mg/dL — ABNORMAL HIGH (ref 0.44–1.00)
GFR, Estimated: 53 mL/min — ABNORMAL LOW (ref >60)
Glucose, Bld: 97 mg/dL (ref 70–99)
Potassium: 4.4 mmol/L (ref 3.5–5.1)
Sodium: 139 mmol/L (ref 135–145)
Total Bilirubin: 0.7 mg/dL (ref 0.0–1.2)
Total Protein: 6.4 g/dL — ABNORMAL LOW (ref 6.5–8.1)

## 2024-01-23 LAB — PHOSPHORUS: Phosphorus: 4.4 mg/dL (ref 2.5–4.6)

## 2024-01-23 LAB — MAGNESIUM: Magnesium: 2.1 mg/dL (ref 1.7–2.4)

## 2024-01-23 MED ORDER — ENOXAPARIN SODIUM 40 MG/0.4ML IJ SOSY
40.0000 mg | PREFILLED_SYRINGE | INTRAMUSCULAR | Status: DC
  Administered 2024-01-23 – 2024-01-29 (×7): 40 mg via SUBCUTANEOUS
  Filled 2024-01-23 (×7): qty 0.4

## 2024-01-23 MED ORDER — SENNOSIDES-DOCUSATE SODIUM 8.6-50 MG PO TABS
1.0000 | ORAL_TABLET | Freq: Every evening | ORAL | Status: DC | PRN
  Administered 2024-01-25 – 2024-01-26 (×2): 1
  Filled 2024-01-23 (×2): qty 1

## 2024-01-23 MED ORDER — LEVOTHYROXINE SODIUM 100 MCG PO TABS
100.0000 ug | ORAL_TABLET | Freq: Every day | ORAL | Status: DC
  Administered 2024-01-23 – 2024-01-29 (×5): 100 ug
  Filled 2024-01-23 (×5): qty 1

## 2024-01-23 MED ORDER — LOSARTAN POTASSIUM 50 MG PO TABS
100.0000 mg | ORAL_TABLET | Freq: Every day | ORAL | Status: DC
  Administered 2024-01-23 – 2024-01-29 (×7): 100 mg
  Filled 2024-01-23 (×7): qty 2

## 2024-01-23 MED ORDER — LORAZEPAM 2 MG/ML IJ SOLN
0.5000 mg | INTRAMUSCULAR | Status: DC | PRN
  Administered 2024-01-24 (×2): 1 mg via INTRAVENOUS
  Filled 2024-01-23 (×2): qty 1

## 2024-01-23 MED ORDER — CARVEDILOL 12.5 MG PO TABS
25.0000 mg | ORAL_TABLET | Freq: Two times a day (BID) | ORAL | Status: DC
  Administered 2024-01-23 – 2024-01-25 (×4): 25 mg
  Filled 2024-01-23 (×4): qty 2

## 2024-01-23 NOTE — Progress Notes (Signed)
 Progress Note   Patient: Connie Shepard FMW:[id number] DOB: 1966-02-03 DOA: 01/14/2024     9 DOS: the patient was seen and examined on 01/23/2024   Brief hospital course: Connie Shepard is a 57 y.o. female with medical history significant for stroke with baseline Right hemiparesis, DM II, CHF, HTN, hypothyroidism. Patient was brought to the ED via EMS with reports of unresponsiveness, right gaze, and aphasia.  Family reported that patient was last known well at about 8 PM last night when she went to bed.  This morning when she woke up she is not feeling well and was not talking much at all, it was about 12:30 PM after she woke up from a nap and I did notice that she was not talking at all.  EMS noted that she was gazing to the right on the following directions. On my evaluation, patient is awake, but gazing to the right and unable to answer questions.   ED Course: BP 219/155.  Heart rate 102-112.  RR 11-22.   Code stroke called.  Patient outside window for thrombolysis.   Head CT-suggested an acute infarct. Subsequent MRI brain - Acute infarcts in the right corona radiata, caudate, lentiform nucleus, and external capsule. Extensive chronic microvascular ischemic changes, generalized parenchymal volume loss, and multiple remote infarcts. CTA head and neck-severe stenosis in several arteries. Tele-neurology was consulted, okay for patient to be admitted here at [facility].   Assessment & Plan:   Principal Problem:   Acute CVA (cerebrovascular accident) [facility]) Active Problems:   Essential hypertension   Diabetes mellitus (HCC)   Hypothyroidism   Chronic combined systolic and diastolic congestive heart failure (HCC)    Assessment and Plan: Acute CVA- POA: History of stroke, with baseline right hemiparesis  - MRI brain-  Acute infarcts in the right corona radiata, caudate, lentiform nucleus, and external capsule. Extensive chronic microvascular ischemic changes, generalized parenchymal volume  loss, and multiple remote infarcts.   - CTA head and neck reviewed stenosis in multiple vessels. - Neurology consulted, recommended: aspirin , Plavix , x 3 months then continue with aspirin  325 daily For now continue with aspirin  and 25 mg rectally- high-dose statin but patient is unable to tolerate p.o.  Remains aphasic, with right-sided hemiplegia, with right upper arm contracted Now moving left upper extremities, minimal left lower extremity. Severe dysphagia -status post extensive evaluation by speech advised PEG tube placement. She pulled her NG out several times. Restraints ordered with NG in place 01/22/24. Will start antihypertensive meds, feeding per dietician. Palliative care following, prognosis remain poor due to recurrent stroke-severe dysphagia. Family wished for DNR, if she declines leaning towards hospice. Awaiting better BP control for IR placement of PEG tube. Subsequent plan to discharge to SNF with PEG tube once she reaches goal rate.  Hypertension- uncontrolled Not controlled with IV hydralazine  to 20mg  q4 prn, continue clonidine patch. Labetolol IV for very high BP, Enalapril scheduled doses. Unable to give PO, as she has no NG tube, failed SLP. Unable to give Nifedipine drip due to QT prolong issues. Started Cleviprex gtt for goal BP 160/90. She had rash over her chest, arm 01/21/2024. Continue to taper esmolol drip as per pharmacy protocol with goal BP less than 160/90. Continue Dobbhoff for medications, feeds. Increased Coreg  to 25 bid, Losartan  to 100mg  daily.   Type 2 Diabetes mellitus A1c 12/02/2023-8.4.  Sugars stable. Continue accucheks, sliding scale q6h with tube feeds. Hold home metformin , Lantus  while NPO.   Chronic combined systolic and diastolic CHF stable and  compensated.   Last echo 2022 EF 20% with global hypokinesis Repeat 2D Echo: EF estimated 25%, severe decreased LV function, finding consistent with Takotsubo cardiomyopathy.  Severe LVH Gentle  hydration to be stopped once feeds started. Hold torsemide  40 mg twice daily, spironolactone .  Ethics: Changed to DNR limited Palliative care consulted, mom and dad present at bedside.  Recurrent stroke, with progressive neurological changes on the new stroke complicated by aphasia, dysphagia-therefore prognosis is poor.  If patient continues to decline family may consider hospice.  Peg tube planned at [facility] with IR, once BP better controlled.  Nursing supportive care. Fall, aspiration precautions. Diet:  Diet Orders (From admission, onward)     Start     Ordered   01/14/24 1329  Diet NPO time specified  Diet effective now       Comments: NPO until stroke swallow screen is complete   01/14/24 1329           DVT prophylaxis: enoxaparin  (LOVENOX ) injection 40 mg Start: 01/23/24 1000 SCDs Start: 01/23/24 0820  Level of care: ICU   Code Status: Limited: Do not attempt resuscitation (DNR) -DNR-LIMITED -Do Not Intubate/DNI   Subjective: Patient is seen and examined today morning.  BP improved on esmolol drip. Plan to restraint her for NG/ OG for meds, feeds. Awaiting PEG placement at [facility]. Parents at bedside. Cant transfer her on drips.  Physical Exam: Vitals:   01/23/24 1015 01/23/24 1030 01/23/24 1045 01/23/24 1100  BP: (!) 158/79 (!) 165/76 (!) 143/84 122/88  Pulse: 68 68 69 66  Resp: 12 11 12 12   Temp:      TempSrc:      SpO2:    96%  Weight:      Height:        General - Middle aged obese Caucasian female, no apparent distress HEENT - PERRLA, EOMI, atraumatic head, non tender sinuses. Lung -breath sounds, basal rales, rhonchi, no wheezes. Heart - S1, S2 heard, no murmurs, rubs, trace pedal edema. Abdomen - Soft, non tender obese, bowel sounds good. Neuro - alert, right hemiparesis, left hand restrained. Unable to do full neuro exam. Skin - Warm and dry.  Data Reviewed:      Latest Ref Rng & Units 01/23/2024    4:53 AM 01/21/2024    5:30 PM 01/21/2024    5:50  AM  CBC  WBC 4.0 - 10.5 K/uL 7.2  8.9  6.6   Hemoglobin 12.0 - 15.0 g/dL 84.2  83.2  83.5   Hematocrit 36.0 - 46.0 % 47.2  49.6  51.7   Platelets 150 - 400 K/uL 186  188  41  C    C Corrected result      Latest Ref Rng & Units 01/23/2024    4:53 AM 01/20/2024    7:49 AM 01/19/2024    6:07 AM  BMP  Glucose 70 - 99 mg/dL 97  867  867   BUN 6 - 20 mg/dL 19  13  13    Creatinine 0.44 - 1.00 mg/dL 8.79  9.02  9.07   Sodium 135 - 145 mmol/L 139  143  142   Potassium 3.5 - 5.1 mmol/L 4.4  3.2  3.9   Chloride 98 - 111 mmol/L 105  107  107   CO2 22 - 32 mmol/L 20  23  23    Calcium  8.9 - 10.3 mg/dL 9.4  9.3  89.9    DG Abd Portable 1V Result Date: 01/22/2024 CLINICAL DATA:  Nasogastric  tube placement. EXAM: PORTABLE ABDOMEN - 1 VIEW COMPARISON:  Radiograph 01/20/2024 FINDINGS: Tip and side port of the enteric tube below the diaphragm in the stomach. Nonobstructive upper abdominal bowel gas pattern. IMPRESSION: Tip and side port of the enteric tube below the diaphragm in the stomach. Electronically Signed   By: Andrea Gasman M.D.   On: 01/22/2024 15:08   US  EKG SITE RITE Result Date: 01/22/2024 If Site Rite image not attached, placement could not be confirmed due to current cardiac rhythm.    Family Communication: Discussed with patient's parents at bedside.  They understand and agree. All questions answered.  Disposition: Status is: Inpatient Remains inpatient appropriate because: NG for meds, feeds, once BP controlled off drip, PEG tube placement with IR at [facility].  Planned Discharge Destination: Skilled nursing facility     Time spent: 52 minutes  Author: Concepcion Riser, MD 01/23/2024 11:53 AM Secure chat 7am to 7pm For on call review [email or web address].

## 2024-01-23 NOTE — Progress Notes (Signed)
 Nutrition Follow-up / Consult  DOCUMENTATION CODES:   Non-severe (moderate) malnutrition in context of acute illness/injury  INTERVENTION:   Tube feeding via NG tube: Osmolite 1.5 at 25 ml/h increasing by 10 ml every 12 hours to goal rate of 45 ml/h (1080 ml per day). Prosource TF20 60 ml once daily. Provides 1700 kcal, 88 gm protein, 823 ml free water daily.  MVI with minerals daily via tube.  Monitor magnesium , potassium, and phosphorus daily for at least 3 days, MD to replete as needed, as pt is at risk for refeeding syndrome given minimal intake since admission.   Thiamine supplementation 100 mg daily x 7 days.  NUTRITION DIAGNOSIS:   Moderate Malnutrition related to acute illness (acute CVA) as evidenced by energy intake < 75% for > 7 days, mild muscle depletion; new diagnosis  GOAL:   Patient will meet greater than or equal to 90% of their needs; progressing with initiation of TF  MONITOR:   TF tolerance  REASON FOR ASSESSMENT:   Consult New TPN/TNA  ASSESSMENT:   Pt with medical history significant for stroke with baseline right hemiparesis, diabetes mellitus, CHF, hypertension, hypothyroidism. Pt admitted with with reports of unresponsiveness, right gaze, and aphasia.  Patient unable to provide any nutrition history. No family at bedside.  NG tube was placed this morning. Restraints in place to deter patient from removing it. Osmolite 1.5 has been initiated, currently infusing at 25 ml/h with orders to increase by 10 ml every 12 hours to goal rate of 45 ml/h with Prosource TF20 60 ml daily.  Plans for transfer to [facility] for PEG placement when able to wean from titratable drips; currently on esmolol for elevated blood pressure.  Patient has received minimal nutrition since admission. NPO since admission. TF has been unable to be provided d/t patient pulling NG tube after it was placed earlier in her admission and NG tube replacement was unsuccessful.  She is at  increased risk of refeeding syndrome, given minimal intake since admission. Patient meets criteria for moderate malnutrition, given mild depletion of muscle mass and energy intake < 75% of estimated energy requirement for > 7 days.  Labs reviewed. Creatinine 1.2 CBG: 101-111-108  Medications reviewed and include novolog , MVI with minerals, aldactone , thiamine, esmolol.  Admit weight 83.1 kg (9/30) Current weight 85.3 kg (10/9)  NUTRITION - FOCUSED PHYSICAL EXAM:  Flowsheet Row Most Recent Value  Orbital Region No depletion  Upper Arm Region No depletion  Thoracic and Lumbar Region No depletion  Buccal Region No depletion  Temple Region No depletion  Clavicle Bone Region No depletion  Clavicle and Acromion Bone Region Mild depletion  Scapular Bone Region Unable to assess  Dorsal Hand No depletion  Patellar Region No depletion  Anterior Thigh Region Mild depletion  Posterior Calf Region Mild depletion  Edema (RD Assessment) Mild  Hair Reviewed  Eyes Reviewed  Mouth Reviewed  Skin Reviewed  Nails Reviewed    Diet Order:   Diet Order             Diet NPO time specified  Diet effective now                   EDUCATION NEEDS:   No education needs have been identified at this time  Skin:  Skin Assessment: Reviewed RN Assessment  Last BM:  10/8 type 7  Height:   Ht Readings from Last 1 Encounters:  01/14/24 5' 5 (1.651 m)    Weight:   Wt Readings  from Last 1 Encounters:  01/23/24 85.3 kg    Ideal Body Weight:  56.8 kg  BMI:  Body mass index is 31.29 kg/m.  Estimated Nutritional Needs:   Kcal:  1700-1900  Protein:  85-100 grams  Fluid:  1.7-1.9 L   Suzen HUNT RD, LDN, CNSC Contact via secure chat. If unavailable, use group chat RD Inpatient.

## 2024-01-23 NOTE — Progress Notes (Signed)
 Daily Progress Note   [name]       Date: 01/23/2024 DOB: 16-Oct-1965  Age: 58 y.o. MRN#: [id number] Attending Physician: Darci Pore, MD Primary Care Physician: Bobbette Coye LABOR, MD Admit Date: 01/14/2024  Reason for Consultation/Follow-up: Establishing goals of care  Subjective:   58 y.o. female  with past medical history of CHF, ADD, bronchitis, depression, type 2 diabetes, hypertension, obesity, prior CVA, chronic combined systolic and diastolic heart failure, vitamin D  deficiency, and hypothyroidism due to Hashimoto's thyroiditis admitted on 01/14/2024 with right-sided weakness and aphasia.    Workup revealed Acute infarcts in the right corona radiata, caudate, lentiform nucleus, and external capsule.  Also noted to have extensive microvascular ischemic changes and changes consistent with possible component of hypertensive encephalopathy.  Per prior notes, patient has a history of prior strokes.  Deficits include right-sided weakness, aphasia, and dysphagia.   01/15/2024: initial completed, discussed GOC/ACP. Transitioned from full code to DNR/DNI. Family considering transition to comfort care vs feeding tube, time for outcomes.    01/16/2024: further GOC discussion. SNF for rehab with palliative care vs home with hospice support. Family hopeful she will make some improvements so preferred SNF for rehab. However, they are hesitant about feeding tube in the nose due to discomfort and would prefer PEG tube be placed. Care coordination was in process.    01/17/2024: Family confirmed plan to d/c to SNF for skilled therapy and OP Pall follow up with ultimate plan to return home with family support. DNR/DNI. Coordinated with team to get Dobhoff tube placed 10/03 for enteral nutrition until able to establish PEG.  Tentatively planned to get PEG early next week Mon/Tues.   01/20/2024: Interval events: patient pulled out Dobhoff on the evening of 10/03.  Unable to reinsert NG. Med changes made to address hypertension (per IR needed to be <160/90). PMT collaborated with team to assist with care coordination. Risks/benefits PEG discussed. Plan is to proceed with PEG, d/c to SNF for rehab.    01/21/2024: follow up. BP too elevated to place PEG. Collaborated with attending, pharmacy, and bedside nursing staff to assist with medication adjustment. Updated family. They note some improvements with therapy and wish to continue with plan for d/c to SNF.    01/22/2024: Collaborated with the team and attempt to have IR place via PEG.  Unfortunately, unable to accept due to being on blood pressure lowering drip.  Nursing to reattempt NG tube and plan is to try and wean off all blood pressure drip in order to allow transfer to [facility] for PEG tube placement.  Today, interval events reviewed.  NG tube was placed.  Labs independently reviewed.  CO2 low at 20 this is decreased from prior levels.  Continue to trend.  Renal function slightly elevated from previous likely in the setting of n.p.o. status.  Would recommend continuing to trend.  Albumin and total protein levels are low at 3.4 and 6.4 respectively. Low albumin levels are associated with greater disease burden and poorer long-term prognosis.  CBC reviewed and reveals normal white count.  Hemoglobin elevated although improving from previous levels.  Would recommend continuing IV hydration and enteral nutrition. Abdominal x-ray to confirm placement independently reviewed.  Enteric tube appears to be properly in place in the stomach.  Appears safe to use.  Vital signs reviewed.  Patient continues to have significant hypertension.  Did have some improvements in blood pressure at or close to goal however, patient was on IV drip at the time therefore, unable to transfer for  placement of PEG tube.  Otherwise, vital signs appear relatively stable.  Medication administration record reviewed.  On 24-hour look back, patient has had 1 dose of IV Dilaudid  at 0.5 mg.  She received 1 dose of Ativan  0.5 mg via IV.  Otherwise, no as needed symptom meds administered.  Today, patient remains severely aphasic and nonverbal and therefore independent history obtained from nursing staff.  Per nursing staff, patient appears more calm and is not as restless today.  Symptom meds do seem helpful at maintaining comfort.  Chart review/care coordination:  Completed extensive chart review including EPIC notes, labs and imaging (independently reviewed), medication administration record, and vital signs. Coordinated care with attending physician and bedside nursing staff.  Provided patient's father an update via phone.  All questions answered.  He verbalized understanding and is in agreement with the plan as previously discussed.  He is thankful for the update.   Length of Stay: 9   Physical Exam Constitutional:      General: She is not in acute distress.    Appearance: She is not toxic-appearing.     Comments: Drowsy but arouses to light physical stimuli  HENT:     Nose:     Comments: NG tube present    Mouth/Throat:     Mouth: Mucous membranes are dry.  Pulmonary:     Effort: Pulmonary effort is normal. No respiratory distress.  Skin:    General: Skin is warm and dry.  Neurological:     Comments: Squeezes this examiner's hand with her left hand.  No purposeful movement to the right side.  Nonverbal             Vital Signs: BP (!) 174/82   Pulse 67   Temp 97.6 F (36.4 C) (Axillary)   Resp 10   Ht 5' 5 (1.651 m)   Wt 85.3 kg   SpO2 98%   BMI 31.29 kg/m  SpO2: SpO2: 98 % O2 Device: O2 Device: Room Air O2 Flow Rate:        Palliative Assessment/Data: 10 to 20%   Palliative Care Assessment & Plan   Patient Profile/Assessment:  58 year old female with complex  medical history including prior stroke who was admitted to the hospital on 01/14/2024 and diagnosed with recurrent stroke.  Patient with profound deficits.  At baseline, she was bedbound/total care for all ADLs due to right sided hemiparesis from prior stroke.  Now globally aphasic and with severe dysphagia that requires n.p.o. status.  Initial completed 01/15/2024.  She was transition from full code to DNR/DNI.  Discussion regarding transition  to comfort care versus placement of PEG tube for SNF with outpatient palliative follow-up. Ultimately family decides to proceed with PEG tube, STR in SNF, and OP Pall follow up. Dobbhoff tube for enteral nutrition was placed on 01/17/2024, unfortunately, patient self discontinued that evening.  Unable to reinsert NG tube for enteral access.  Plan to wait until patient is able to have PEG established 10/6 or 10/7.  Unfortunately, due to elevated blood pressure readings unable to have PEG placed on 10/6.  Nursing attempted to insert NG tube but was self discontinued by patient immediately after placement.  Collaborated with attending team, interventional radiology, and pharmacy to adjust blood pressure regimen to achieve goal blood pressure of less than 160/90 in order to proceed with PEG.  Blood pressure drip started on 01/21/2024.  Unfortunately patient experienced an allergic reaction.  Transitioned to a different blood pressure drip.  BP remained too elevated to proceed with PEG on 01/21/2024. Nursing to titrate drip to achieve goal blood pressure of less than 160/90.  Plan continues to be to proceed with PEG placement once medically stable. BP improved to goal on 01/22/2024 however, unable to complete PEG as per IR they are unable to perform PEG tube while on BP drip. Nursing placed NG tube temporarily and are attempting to wean off BP drip with meds via NG and provide nutritional support until she can undergo PEG tube. Once PEG placed d/c to SNF for rehab with OP Pall  support. Palliative team will continue to support holistically.     Recommendations/Plan:  DNR/DNI Continue to treat the treatable, time for outcomes Need for enteral access for nutrition, hydration, and meds.  Dobbhoff tube placed on 10/3 and self discontinued that evening.  Unable to insert NG tube despite multiple attempts and or patient self discontinues immediately after it is placed, nursing replaced MG tube on 10/08. Confirmed by XR. Begin enteral nutrition and meds.  Ultimate plan is for PEG. Transfer to [facility] when able for PEG can be placed (either when BP drip able to be discontinued or bed available) Continue PT/OT/ST while inpatient D/c to SNF for short term rehab Outpatient palliative referral  Palliative medicine team will continue to follow for ongoing goals of care discussion, symptom management, and coordination of care.   Symptom management:  Continue hydromorphone  0.5 mg IV every 2 hours as needed  Continue Tylenol  PR as needed Continue Compazine  10 mg IV every 6 hours as needed Continue Lorazepam  0.5 mg to 1 mg IV every 4 hrs as needed    Prognosis: Guarded    Discharge Planning: Skilled Nursing Facility for rehab with Palliative care service follow-up    Detailed review of medical records (labs, imaging, vital signs), medically appropriate exam, discussed with treatment team, counseling and education to patient, family, & staff, documenting clinical information, medication management, coordination of care    Billing based on MDM: High  Problems Addressed: One acute or chronic illness or injury that poses a threat to life or bodily function  Amount and/or Complexity of Data: Category 1:Assessment requiring an independent historian(s), Category 2:Independent interpretation of a test performed by another physician/other qualified health care professional (not separately reported), and Category 3:Discussion of management or test interpretation with external  physician/other qualified health care professional/appropriate source (not separately reported)  Risks: n/a         Laymon CHRISTELLA Pinal, NP  Palliative Medicine Team Team phone # [phone]  Thank you for allowing the Palliative Medicine Team to assist in the  care of this patient. Please utilize secure chat with additional questions, if there is no response within 30 minutes please call the above phone number.  Palliative Medicine Team providers are available by phone from 7am to 7pm daily and can be reached through the team cell phone.  Should this patient require assistance outside of these hours, please call the patient's attending physician.

## 2024-01-24 DIAGNOSIS — I10 Essential (primary) hypertension: Secondary | ICD-10-CM | POA: Diagnosis not present

## 2024-01-24 DIAGNOSIS — Z558 Other problems related to education and literacy: Secondary | ICD-10-CM | POA: Diagnosis not present

## 2024-01-24 DIAGNOSIS — E440 Moderate protein-calorie malnutrition: Secondary | ICD-10-CM | POA: Insufficient documentation

## 2024-01-24 DIAGNOSIS — I639 Cerebral infarction, unspecified: Secondary | ICD-10-CM | POA: Diagnosis not present

## 2024-01-24 DIAGNOSIS — E1369 Other specified diabetes mellitus with other specified complication: Secondary | ICD-10-CM | POA: Diagnosis not present

## 2024-01-24 LAB — GLUCOSE, CAPILLARY
Glucose-Capillary: 141 mg/dL — ABNORMAL HIGH (ref 70–99)
Glucose-Capillary: 145 mg/dL — ABNORMAL HIGH (ref 70–99)
Glucose-Capillary: 159 mg/dL — ABNORMAL HIGH (ref 70–99)
Glucose-Capillary: 170 mg/dL — ABNORMAL HIGH (ref 70–99)
Glucose-Capillary: 178 mg/dL — ABNORMAL HIGH (ref 70–99)
Glucose-Capillary: 190 mg/dL — ABNORMAL HIGH (ref 70–99)
Glucose-Capillary: 208 mg/dL — ABNORMAL HIGH (ref 70–99)
Glucose-Capillary: 241 mg/dL — ABNORMAL HIGH (ref 70–99)

## 2024-01-24 LAB — CBC
HCT: 46 % (ref 36.0–46.0)
Hemoglobin: 15.5 g/dL — ABNORMAL HIGH (ref 12.0–15.0)
MCH: 30.9 pg (ref 26.0–34.0)
MCHC: 33.7 g/dL (ref 30.0–36.0)
MCV: 91.8 fL (ref 80.0–100.0)
Platelets: 220 K/uL (ref 150–400)
RBC: 5.01 MIL/uL (ref 3.87–5.11)
RDW: 14.2 % (ref 11.5–15.5)
WBC: 6.5 K/uL (ref 4.0–10.5)
nRBC: 0 % (ref 0.0–0.2)

## 2024-01-24 MED ORDER — SPIRONOLACTONE 25 MG PO TABS
50.0000 mg | ORAL_TABLET | Freq: Every day | ORAL | Status: DC
  Administered 2024-01-25 – 2024-01-29 (×5): 50 mg
  Filled 2024-01-24 (×5): qty 2

## 2024-01-24 MED ORDER — TORSEMIDE 20 MG PO TABS
40.0000 mg | ORAL_TABLET | Freq: Two times a day (BID) | ORAL | Status: DC
Start: 2024-01-24 — End: 2024-01-27
  Administered 2024-01-24 – 2024-01-26 (×5): 40 mg via NASOGASTRIC
  Filled 2024-01-24 (×5): qty 2

## 2024-01-24 MED ORDER — HYDRALAZINE HCL 25 MG PO TABS
25.0000 mg | ORAL_TABLET | Freq: Three times a day (TID) | ORAL | Status: DC
  Administered 2024-01-24 – 2024-01-25 (×3): 25 mg
  Filled 2024-01-24 (×3): qty 1

## 2024-01-24 MED ORDER — PNEUMOCOCCAL 20-VAL CONJ VACC 0.5 ML IM SUSY
0.5000 mL | PREFILLED_SYRINGE | INTRAMUSCULAR | Status: AC | PRN
  Administered 2024-01-25: 0.5 mL via INTRAMUSCULAR
  Filled 2024-01-24: qty 0.5

## 2024-01-24 MED ORDER — ASPIRIN 81 MG PO CHEW: 162.0000 mg | CHEWABLE_TABLET | Freq: Every day | ORAL | Status: DC

## 2024-01-24 MED ORDER — ASPIRIN 81 MG PO CHEW
81.0000 mg | CHEWABLE_TABLET | Freq: Every day | ORAL | Status: DC
  Administered 2024-01-24 – 2024-01-29 (×6): 81 mg
  Filled 2024-01-24 (×6): qty 1

## 2024-01-24 NOTE — Progress Notes (Signed)
 Progress Note   Patient: Connie Shepard FMW:[id number] DOB: 27-Jun-1965 DOA: 01/14/2024     10 DOS: the patient was seen and examined on 01/24/2024   Brief hospital course: Connie Shepard is a 58 y.o. female with medical history significant for stroke with baseline Right hemiparesis, DM II, CHF, HTN, hypothyroidism. Patient was brought to the ED via EMS with reports of unresponsiveness, right gaze, and aphasia.   Code stroke called.  Patient outside window for thrombolysis.   Head CT-suggested an acute infarct. Subsequent MRI brain - Acute infarcts in the right corona radiata, caudate, lentiform nucleus, and external capsule. Extensive chronic microvascular ischemic changes, generalized parenchymal volume loss, and multiple remote infarcts. CTA head and neck-severe stenosis in several arteries. Tele-neurology was consulted, okay for patient to be admitted here at [facility].  Neurology recommended: aspirin , Plavix , x 3 months then continue with aspirin  325 daily. She failed swallow eval. She is awaiting PEG tube placement at [facility]. Dobhoff placed with left arm restraint, started meds, feeds. Awaiting to come off esmolol drip for her to go [facility] for PEG and subsequently to SNF upon dc.   Assessment & Plan:   Principal Problem:   Acute CVA (cerebrovascular accident) [facility]) Active Problems:   Essential hypertension   Diabetes mellitus (HCC)   Hypothyroidism   Chronic combined systolic and diastolic congestive heart failure (HCC)    Assessment and Plan: Acute CVA- POA: History of stroke, with baseline right hemiparesis  - MRI brain-  Acute infarcts in the right corona radiata, caudate, lentiform nucleus, and external capsule. Extensive chronic microvascular ischemic changes, generalized parenchymal volume loss, and multiple remote infarcts.   - CTA head and neck reviewed stenosis in multiple vessels. - Neurology consulted, recommended: aspirin , Plavix , x 3 months then continue with  aspirin  325 daily Remains aphasic, with right-sided hemiplegia, with right upper arm contracted Now moving left upper extremities, minimal left lower extremity. Severe dysphagia -status post extensive evaluation by speech advised PEG tube placement. She pulled her NG out several times. Restraints ordered with NG in place 01/22/24. Started diet, antihypertensive meds. Aspirin , Plavix  changed thru tube. Palliative care following, prognosis remain poor due to recurrent stroke-severe dysphagia. Family wished for DNR, if she declines leaning towards hospice. Awaiting better BP control off esmolol drip for IR placement of PEG tube. Subsequent plan to discharge to SNF with PEG tube once she reaches goal rate.  Hypertension- uncontrolled Not controlled with IV hydralazine  to 20mg  q4 prn, continue clonidine patch. Labetolol IV for very high BP, Enalapril scheduled doses. Unable to give PO, as she has no NG tube, failed SLP. Unable to give Nifedipine drip due to QT prolong issues. Started Cleviprex gtt for goal BP 160/90. She had rash over her chest, arm 01/21/2024. Continue to taper esmolol drip as per pharmacy protocol with goal BP less than 160/90. Continue Dobbhoff for medications, feeds. Continue Coreg  25 bid, Losartan  to 100mg  daily, increase spironolactone  to 50 daily, continue clonidine.  Hydralazine  25 mg Q8 hourly standing dose ordered.   Type 2 Diabetes mellitus A1c 12/02/2023-8.4.  Sugars stable. Continue accucheks, sliding scale q6h with tube feeds. Hold home metformin , resume Lantus  if sugars elevated.     Chronic combined systolic and diastolic CHF stable and compensated.   Last echo 2022 EF 20% with global hypokinesis Repeat 2D Echo: EF estimated 25%, severe decreased LV function, finding consistent with Takotsubo cardiomyopathy.  Severe LVH Gentle hydration to be stopped once feeds started. Resume torsemide  40 mg twice daily.  Ethics: Changed to DNR limited Palliative care  consulted, mom and dad present at bedside.  Recurrent stroke, with progressive neurological changes on the new stroke complicated by aphasia, dysphagia-therefore prognosis is poor.  If patient continues to decline family may consider hospice.  Peg tube planned at [facility] with IR, once BP better controlled.  Nursing supportive care. Fall, aspiration precautions. Diet:  Diet Orders (From admission, onward)     Start     Ordered   01/14/24 1329  Diet NPO time specified  Diet effective now       Comments: NPO until stroke swallow screen is complete   01/14/24 1329           DVT prophylaxis: enoxaparin  (LOVENOX ) injection 40 mg Start: 01/23/24 1000 SCDs Start: 01/23/24 0820  Level of care: ICU   Code Status: Limited: Do not attempt resuscitation (DNR) -DNR-LIMITED -Do Not Intubate/DNI   Subjective: Patient is seen and examined today morning.  She remains calm, BP improved still elevated.  Continues to be on esmolol drip.  Parents at bedside.   Physical Exam: Vitals:   01/24/24 1023 01/24/24 1200 01/24/24 1300 01/24/24 1500  BP: (!) 176/72 (!) 145/71 (!) 162/70 (!) 175/100  Pulse:  79 77 77  Resp:  16 15 17   Temp:      TempSrc:      SpO2:  93% 93% 96%  Weight:      Height:        General - Middle aged obese Caucasian female, no apparent distress HEENT - PERRLA, EOMI, atraumatic head, non tender sinuses. Lung -breath sounds, basal rales, rhonchi, no wheezes. Heart - S1, S2 heard, no murmurs, rubs, trace pedal edema. Abdomen - Soft, non tender obese, bowel sounds good. Neuro - alert, right hemiparesis, left hand restrained. Unable to do full neuro exam. Skin - Warm and dry.  Data Reviewed:      Latest Ref Rng & Units 01/24/2024    3:31 AM 01/23/2024    4:53 AM 01/21/2024    5:30 PM  CBC  WBC 4.0 - 10.5 K/uL 6.5  7.2  8.9   Hemoglobin 12.0 - 15.0 g/dL 84.4  84.2  83.2   Hematocrit 36.0 - 46.0 % 46.0  47.2  49.6   Platelets 150 - 400 K/uL 220  186  188       Latest Ref  Rng & Units 01/23/2024    4:53 AM 01/20/2024    7:49 AM 01/19/2024    6:07 AM  BMP  Glucose 70 - 99 mg/dL 97  867  867   BUN 6 - 20 mg/dL 19  13  13    Creatinine 0.44 - 1.00 mg/dL 8.79  9.02  9.07   Sodium 135 - 145 mmol/L 139  143  142   Potassium 3.5 - 5.1 mmol/L 4.4  3.2  3.9   Chloride 98 - 111 mmol/L 105  107  107   CO2 22 - 32 mmol/L 20  23  23    Calcium  8.9 - 10.3 mg/dL 9.4  9.3  89.9    No results found.    Family Communication: Discussed with patient's parents at bedside.  They understand and agree. All questions answered.  Disposition: Status is: Inpatient Remains inpatient appropriate because: NG for meds, feeds, once BP controlled off drip, PEG tube placement with IR at [facility].  Planned Discharge Destination: Skilled nursing facility     Time spent: 50 minutes  Author: Concepcion Riser, MD 01/24/2024 3:59 PM Secure chat  7am to 7pm For on call review [email or web address].

## 2024-01-24 NOTE — Progress Notes (Signed)
 Physical Therapy Treatment Patient Details Name: Connie Shepard MRN: [id number] DOB: 08-20-1965 Today's Date: 01/24/2024   History of Present Illness Connie Shepard is a 58 y.o. female with medical history significant for stroke with baseline right hemiparesis, diabetes mellitus, CHF, hypertension, hypothyroidism.  Patient was brought to the ED via EMS with reports of unresponsiveness, right gaze, and aphasia.  Family reported that patient was last known well at about 8 PM last night when she went to bed.  This morning when she woke up she is not feeling well and was not talking much at all, it was about 12:30 PM after she woke up from a nap and I did notice that she was not talking at all.  EMS noted that she was gazing to the right on the following directions.  On my evaluation, patient is awake, but gazing to the right and unable to answer questions.    PT Comments  Patient presents lethargic, but able to keep eyes open after starting functional mobility. Patient demonstrates slow labored movement for sitting up at bedside with limited use of extremities due to weakness, tolerated sitting up at bedside for up to 20 minutes with flexed trunk and unable to lift head or extremities against gravity due to fatigue and weakness. Patient put back to bed with Max 2 person assist for repositioning. Patient will benefit from continued skilled physical therapy in hospital and recommended venue below to increase strength, balance, endurance for safe ADLs and gait.      If plan is discharge home, recommend the following: A lot of help with bathing/dressing/bathroom;Help with stairs or ramp for entrance;A lot of help with walking and/or transfers;Assistance with cooking/housework;Assist for transportation   Can travel by private vehicle     No  Equipment Recommendations  None recommended by PT    Recommendations for Other Services       Precautions / Restrictions Precautions Precautions: Fall Recall of  Precautions/Restrictions: Impaired Restrictions Weight Bearing Restrictions Per Provider Order: No     Mobility  Bed Mobility Overal bed mobility: Needs Assistance Bed Mobility: Supine to Sit, Sit to Supine Rolling: Max assist, Used rails   Supine to sit: Max assist, HOB elevated, Total assist Sit to supine: Max assist, Total assist   General bed mobility comments: slow labored movement with poor carryover for using LUE    Transfers                        Ambulation/Gait                   Stairs             Wheelchair Mobility     Tilt Bed    Modified Rankin (Stroke Patients Only)       Balance Overall balance assessment: Needs assistance Sitting-balance support: Feet unsupported, Single extremity supported Sitting balance-Leahy Scale: Poor Sitting balance - Comments: seated at EOB, frequent leaning forward                                    Communication Communication Communication: Impaired Factors Affecting Communication: Difficulty expressing self  Cognition Arousal: Lethargic Behavior During Therapy: Flat affect                             Following commands: Impaired Following commands impaired: Follows one step commands  inconsistently    Cueing Cueing Techniques: Gestural cues, Tactile cues, Verbal cues  Exercises Other Exercises Other Exercises: Active assisted left hip, knee, ankle flexion/extension x 10 reps, Passive ROM to right hip, knees, ankles x 10 reps each Other Exercises: Active assisted BLE hip flexion/abduction x 10 reps each    General Comments        Pertinent Vitals/Pain Pain Assessment Pain Assessment: No/denies pain    Home Living                          Prior Function            PT Goals (current goals can now be found in the care plan section) Acute Rehab PT Goals Patient Stated Goal: not stated. Family wants patient to return home after rehab PT Goal  Formulation: With patient Time For Goal Achievement: 01/29/24 Potential to Achieve Goals: Fair Progress towards PT goals: Progressing toward goals    Frequency    Min 2X/week      PT Plan      Co-evaluation              AM-PAC PT 6 Clicks Mobility   Outcome Measure  Help needed turning from your back to your side while in a flat bed without using bedrails?: A Lot Help needed moving from lying on your back to sitting on the side of a flat bed without using bedrails?: A Lot Help needed moving to and from a bed to a chair (including a wheelchair)?: Total Help needed standing up from a chair using your arms (e.g., wheelchair or bedside chair)?: Total Help needed to walk in hospital room?: Total Help needed climbing 3-5 steps with a railing? : Total 6 Click Score: 8    End of Session   Activity Tolerance: Patient tolerated treatment well Patient left: in bed;with call bell/phone within reach Nurse Communication: Mobility status PT Visit Diagnosis: Unsteadiness on feet (R26.81);Other abnormalities of gait and mobility (R26.89);Muscle weakness (generalized) (M62.81)     Time: 8559-8491 PT Time Calculation (min) (ACUTE ONLY): 28 min  Charges:    $Therapeutic Exercise: 8-22 mins $Therapeutic Activity: 8-22 mins PT General Charges $$ ACUTE PT VISIT: 1 Visit                     3:37 PM, 01/24/24 Lynwood Music, MPT Physical Therapist with [employer] [phone] office [phone] mobile phone

## 2024-01-24 NOTE — Progress Notes (Signed)
 SLP Cancellation Note  Patient Details Name: Connie Shepard MRN: [id number] DOB: 1966-03-23   Cancelled treatment:       Reason Eval/Treat Not Completed: Fatigue/lethargy limiting ability to participate; spoke with nursing regading pt's alertness level/lethargy, so po trials not attempted this date.  Discussed continuation of QID oral care and offering ice chips and sips of thin and/or nectar thick liquids for comfort if pt alertness level allows.   Pat Damani Rando,M.S.,CCC-SLP 01/24/2024, 2:26 PM

## 2024-01-24 NOTE — Plan of Care (Signed)
  Problem: Education: Goal: Knowledge of General Education information will improve Description: Including pain rating scale, medication(s)/side effects and non-pharmacologic comfort measures Outcome: Progressing   Problem: Health Behavior/Discharge Planning: Goal: Ability to manage health-related needs will improve Outcome: Progressing   Problem: Clinical Measurements: Goal: Ability to maintain clinical measurements within normal limits will improve Outcome: Progressing Goal: Will remain free from infection Outcome: Progressing Goal: Diagnostic test results will improve Outcome: Progressing Goal: Respiratory complications will improve Outcome: Progressing Goal: Cardiovascular complication will be avoided Outcome: Progressing   Problem: Activity: Goal: Risk for activity intolerance will decrease Outcome: Progressing   Problem: Nutrition: Goal: Adequate nutrition will be maintained Outcome: Progressing   Problem: Coping: Goal: Level of anxiety will decrease Outcome: Progressing   Problem: Elimination: Goal: Will not experience complications related to bowel motility Outcome: Progressing Goal: Will not experience complications related to urinary retention Outcome: Progressing   Problem: Pain Managment: Goal: General experience of comfort will improve and/or be controlled Outcome: Progressing   Problem: Safety: Goal: Ability to remain free from injury will improve Outcome: Progressing   Problem: Skin Integrity: Goal: Risk for impaired skin integrity will decrease Outcome: Progressing   Problem: Education: Goal: Ability to describe self-care measures that may prevent or decrease complications (Diabetes Survival Skills Education) will improve Outcome: Progressing Goal: Individualized Educational Video(s) Outcome: Progressing   Problem: Coping: Goal: Ability to adjust to condition or change in health will improve Outcome: Progressing   Problem: Fluid  Volume: Goal: Ability to maintain a balanced intake and output will improve Outcome: Progressing   Problem: Health Behavior/Discharge Planning: Goal: Ability to identify and utilize available resources and services will improve Outcome: Progressing Goal: Ability to manage health-related needs will improve Outcome: Progressing   Problem: Metabolic: Goal: Ability to maintain appropriate glucose levels will improve Outcome: Progressing   Problem: Nutritional: Goal: Maintenance of adequate nutrition will improve Outcome: Progressing Goal: Progress toward achieving an optimal weight will improve Outcome: Progressing   Problem: Skin Integrity: Goal: Risk for impaired skin integrity will decrease Outcome: Progressing   Problem: Tissue Perfusion: Goal: Adequacy of tissue perfusion will improve Outcome: Progressing   Problem: Education: Goal: Knowledge of disease or condition will improve Outcome: Progressing Goal: Knowledge of secondary prevention will improve (MUST DOCUMENT ALL) Outcome: Progressing Goal: Knowledge of patient specific risk factors will improve (DELETE if not current risk factor) Outcome: Progressing   Problem: Ischemic Stroke/TIA Tissue Perfusion: Goal: Complications of ischemic stroke/TIA will be minimized Outcome: Progressing   Problem: Coping: Goal: Will verbalize positive feelings about self Outcome: Progressing Goal: Will identify appropriate support needs Outcome: Progressing   Problem: Health Behavior/Discharge Planning: Goal: Ability to manage health-related needs will improve Outcome: Progressing Goal: Goals will be collaboratively established with patient/family Outcome: Progressing   Problem: Self-Care: Goal: Ability to participate in self-care as condition permits will improve Outcome: Progressing Goal: Verbalization of feelings and concerns over difficulty with self-care will improve Outcome: Progressing Goal: Ability to communicate  needs accurately will improve Outcome: Progressing   Problem: Nutrition: Goal: Risk of aspiration will decrease Outcome: Progressing Goal: Dietary intake will improve Outcome: Progressing   Problem: Safety: Goal: Non-violent Restraint(s) Outcome: Progressing

## 2024-01-25 DIAGNOSIS — I10 Essential (primary) hypertension: Secondary | ICD-10-CM | POA: Diagnosis not present

## 2024-01-25 DIAGNOSIS — I639 Cerebral infarction, unspecified: Secondary | ICD-10-CM | POA: Diagnosis not present

## 2024-01-25 DIAGNOSIS — Z558 Other problems related to education and literacy: Secondary | ICD-10-CM | POA: Diagnosis not present

## 2024-01-25 DIAGNOSIS — E1369 Other specified diabetes mellitus with other specified complication: Secondary | ICD-10-CM | POA: Diagnosis not present

## 2024-01-25 LAB — CBC
HCT: 47.8 % — ABNORMAL HIGH (ref 36.0–46.0)
Hemoglobin: 16.5 g/dL — ABNORMAL HIGH (ref 12.0–15.0)
MCH: 31.2 pg (ref 26.0–34.0)
MCHC: 34.5 g/dL (ref 30.0–36.0)
MCV: 90.4 fL (ref 80.0–100.0)
Platelets: 241 K/uL (ref 150–400)
RBC: 5.29 MIL/uL — ABNORMAL HIGH (ref 3.87–5.11)
RDW: 14.5 % (ref 11.5–15.5)
WBC: 8.2 K/uL (ref 4.0–10.5)
nRBC: 0 % (ref 0.0–0.2)

## 2024-01-25 LAB — GLUCOSE, CAPILLARY
Glucose-Capillary: 145 mg/dL — ABNORMAL HIGH (ref 70–99)
Glucose-Capillary: 152 mg/dL — ABNORMAL HIGH (ref 70–99)
Glucose-Capillary: 198 mg/dL — ABNORMAL HIGH (ref 70–99)
Glucose-Capillary: 260 mg/dL — ABNORMAL HIGH (ref 70–99)
Glucose-Capillary: 315 mg/dL — ABNORMAL HIGH (ref 70–99)

## 2024-01-25 MED ORDER — ORAL CARE MOUTH RINSE: 15.0000 mL | OROMUCOSAL | Status: DC | PRN

## 2024-01-25 MED ORDER — INSULIN ASPART 100 UNIT/ML IJ SOLN
3.0000 [IU] | Freq: Once | INTRAMUSCULAR | Status: AC
  Administered 2024-01-25: 3 [IU] via SUBCUTANEOUS

## 2024-01-25 MED ORDER — HYDRALAZINE HCL 50 MG PO TABS
50.0000 mg | ORAL_TABLET | Freq: Three times a day (TID) | ORAL | Status: DC
  Administered 2024-01-25 – 2024-01-29 (×10): 50 mg
  Filled 2024-01-25 (×10): qty 1

## 2024-01-25 MED ORDER — ORAL CARE MOUTH RINSE
15.0000 mL | OROMUCOSAL | Status: DC
  Administered 2024-01-25 – 2024-01-29 (×16): 15 mL via OROMUCOSAL

## 2024-01-25 MED ORDER — INSULIN GLARGINE 100 UNIT/ML ~~LOC~~ SOLN
10.0000 [IU] | Freq: Every day | SUBCUTANEOUS | Status: DC
  Administered 2024-01-25 – 2024-01-28 (×4): 10 [IU] via SUBCUTANEOUS
  Filled 2024-01-25 (×5): qty 0.1

## 2024-01-25 MED ORDER — CARVEDILOL 12.5 MG PO TABS
37.5000 mg | ORAL_TABLET | Freq: Two times a day (BID) | ORAL | Status: DC
Start: 2024-01-25 — End: 2024-01-28
  Administered 2024-01-25 – 2024-01-27 (×5): 37.5 mg
  Filled 2024-01-25 (×5): qty 3

## 2024-01-25 MED ORDER — INSULIN ASPART 100 UNIT/ML IJ SOLN: 3.0000 [IU] | Freq: Three times a day (TID) | INTRAMUSCULAR | Status: DC

## 2024-01-25 NOTE — Progress Notes (Signed)
 Speech Language Pathology Treatment: Dysphagia  Patient Details Name: Connie Shepard MRN: [id number] DOB: 1966/01/04 Today's Date: 01/25/2024 Time: 1450-1501 SLP Time Calculation (min) (ACUTE ONLY): 11 min  Assessment / Plan / Recommendation Clinical Impression  Pt seen for a brief po readiness trial with limited consistencies d/t continued lethargy/min interaction; nursing staff provided oral care prior to SLP arrival, so this was not completed.  Nursing stated pt biting on swab during oral care d/t impaired cognitive status.  Pt did not consistently follow commands this session, nor indicate via head nod/shake as seen in prior sessions when asked simple questions.  Pt seated upright and given single ice chips via tsp with oral holding/retention, prolonged oral preparation/propulsion and delay in the initiation of the swallow.  Buccal pocketing observed with eventual oral suction provided to remove excess saliva and remainder of ice chips.  Pt fell asleep after min presentations of ice chips, so po readiness trial discontinued.  ST will continue to f/u in acute setting for dysphagia tx/management pending PEG placement if medical status remains stable.     HPI HPI: Connie Shepard is a 58 y.o. female with medical history significant for stroke with baseline right hemiparesis, diabetes mellitus, CHF, hypertension, hypothyroidism. Patient was brought to the ED via EMS with reports of unresponsiveness, right gaze, and aphasia. Family reported that patient was last known well at about 8 PM last night when she went to bed. This morning when she woke up she is not feeling well and was not talking much at all, it was about 12:30 PM after she woke up from a nap and I did notice that she was not talking at all. EMS noted that she was gazing to the right on the following directions. BSE recommendations for NPO. ST f/u for speech/language and dysphagia tx. PEG pending d/t medical concenrs (elevated BP) delaying  procedure. ST f/u for dysphagia management/tx.      SLP Plan  Continue with current plan of care          Recommendations  Diet recommendations: NPO Medication Administration: Via alternative means                  Oral care QID;Oral care prior to ice chip/H20   Frequent or constant Supervision/Assistance Dysphagia, oropharyngeal phase (R13.12)     Continue with current plan of care     Pat Tessa Seaberry,M.S.,CCC-SLP 01/25/2024, 3:05 PM

## 2024-01-25 NOTE — Plan of Care (Signed)
 This patient remains on AP-CCU as of time of writing. The patient has severe neurological deficits (see assessment flowsheets). NGT in place with enteral feeds per orders. Left wrist restraint to prevent self-removal of NGT. The patient remains on an active infusion of esmolol.   Problem: Education: Goal: Knowledge of General Education information will improve Description: Including pain rating scale, medication(s)/side effects and non-pharmacologic comfort measures Outcome: Not Progressing   Problem: Health Behavior/Discharge Planning: Goal: Ability to manage health-related needs will improve Outcome: Not Progressing   Problem: Clinical Measurements: Goal: Ability to maintain clinical measurements within normal limits will improve Outcome: Not Progressing Goal: Will remain free from infection Outcome: Not Progressing Goal: Diagnostic test results will improve Outcome: Not Progressing Goal: Respiratory complications will improve Outcome: Not Progressing Goal: Cardiovascular complication will be avoided Outcome: Not Progressing   Problem: Activity: Goal: Risk for activity intolerance will decrease Outcome: Not Progressing   Problem: Nutrition: Goal: Adequate nutrition will be maintained Outcome: Not Progressing   Problem: Coping: Goal: Level of anxiety will decrease Outcome: Not Progressing   Problem: Elimination: Goal: Will not experience complications related to bowel motility Outcome: Not Progressing Goal: Will not experience complications related to urinary retention Outcome: Not Progressing   Problem: Pain Managment: Goal: General experience of comfort will improve and/or be controlled Outcome: Not Progressing   Problem: Safety: Goal: Ability to remain free from injury will improve Outcome: Not Progressing   Problem: Skin Integrity: Goal: Risk for impaired skin integrity will decrease Outcome: Not Progressing   Problem: Education: Goal: Ability to describe  self-care measures that may prevent or decrease complications (Diabetes Survival Skills Education) will improve Outcome: Not Progressing Goal: Individualized Educational Video(s) Outcome: Not Progressing   Problem: Coping: Goal: Ability to adjust to condition or change in health will improve Outcome: Not Progressing   Problem: Fluid Volume: Goal: Ability to maintain a balanced intake and output will improve Outcome: Not Progressing   Problem: Health Behavior/Discharge Planning: Goal: Ability to identify and utilize available resources and services will improve Outcome: Not Progressing Goal: Ability to manage health-related needs will improve Outcome: Not Progressing   Problem: Metabolic: Goal: Ability to maintain appropriate glucose levels will improve Outcome: Not Progressing   Problem: Nutritional: Goal: Maintenance of adequate nutrition will improve Outcome: Not Progressing Goal: Progress toward achieving an optimal weight will improve Outcome: Not Progressing   Problem: Skin Integrity: Goal: Risk for impaired skin integrity will decrease Outcome: Not Progressing   Problem: Tissue Perfusion: Goal: Adequacy of tissue perfusion will improve Outcome: Not Progressing   Problem: Education: Goal: Knowledge of disease or condition will improve Outcome: Not Progressing Goal: Knowledge of secondary prevention will improve (MUST DOCUMENT ALL) Outcome: Not Progressing Goal: Knowledge of patient specific risk factors will improve (DELETE if not current risk factor) Outcome: Not Progressing   Problem: Ischemic Stroke/TIA Tissue Perfusion: Goal: Complications of ischemic stroke/TIA will be minimized Outcome: Not Progressing   Problem: Coping: Goal: Will verbalize positive feelings about self Outcome: Not Progressing Goal: Will identify appropriate support needs Outcome: Not Progressing   Problem: Health Behavior/Discharge Planning: Goal: Ability to manage health-related  needs will improve Outcome: Not Progressing Goal: Goals will be collaboratively established with patient/family Outcome: Not Progressing   Problem: Self-Care: Goal: Ability to participate in self-care as condition permits will improve Outcome: Not Progressing Goal: Verbalization of feelings and concerns over difficulty with self-care will improve Outcome: Not Progressing Goal: Ability to communicate needs accurately will improve Outcome: Not Progressing   Problem: Nutrition: Goal:  Risk of aspiration will decrease Outcome: Not Progressing Goal: Dietary intake will improve Outcome: Not Progressing   Problem: Safety: Goal: Non-violent Restraint(s) Outcome: Not Progressing   Problem: Education: Goal: Ability to demonstrate management of disease process will improve Outcome: Not Progressing Goal: Ability to verbalize understanding of medication therapies will improve Outcome: Not Progressing Goal: Individualized Educational Video(s) Outcome: Not Progressing   Problem: Activity: Goal: Capacity to carry out activities will improve Outcome: Not Progressing   Problem: Cardiac: Goal: Ability to achieve and maintain adequate cardiopulmonary perfusion will improve Outcome: Not Progressing

## 2024-01-25 NOTE — Plan of Care (Signed)
 This patient remains on AP-CCU as of time of writing. The patient has been weaned off of Esmolol infusion as of this afternoon; continuing to frequently monitor BP. NGT with enteral feeds remains in place. The patient remains in a left wrist restraint due to attempts to remove NGT, Foley, PIV, etc.    Problem: Education: Goal: Knowledge of General Education information will improve Description: Including pain rating scale, medication(s)/side effects and non-pharmacologic comfort measures Outcome: Not Progressing   Problem: Health Behavior/Discharge Planning: Goal: Ability to manage health-related needs will improve Outcome: Not Progressing   Problem: Clinical Measurements: Goal: Ability to maintain clinical measurements within normal limits will improve Outcome: Not Progressing Goal: Will remain free from infection Outcome: Not Progressing Goal: Diagnostic test results will improve Outcome: Not Progressing Goal: Respiratory complications will improve Outcome: Not Progressing Goal: Cardiovascular complication will be avoided Outcome: Not Progressing   Problem: Activity: Goal: Risk for activity intolerance will decrease Outcome: Not Progressing   Problem: Nutrition: Goal: Adequate nutrition will be maintained Outcome: Not Progressing   Problem: Coping: Goal: Level of anxiety will decrease Outcome: Not Progressing   Problem: Elimination: Goal: Will not experience complications related to bowel motility Outcome: Not Progressing Goal: Will not experience complications related to urinary retention Outcome: Not Progressing   Problem: Pain Managment: Goal: General experience of comfort will improve and/or be controlled Outcome: Not Progressing   Problem: Safety: Goal: Ability to remain free from injury will improve Outcome: Not Progressing   Problem: Skin Integrity: Goal: Risk for impaired skin integrity will decrease Outcome: Not Progressing   Problem: Education: Goal:  Ability to describe self-care measures that may prevent or decrease complications (Diabetes Survival Skills Education) will improve Outcome: Not Progressing Goal: Individualized Educational Video(s) Outcome: Not Progressing   Problem: Coping: Goal: Ability to adjust to condition or change in health will improve Outcome: Not Progressing   Problem: Fluid Volume: Goal: Ability to maintain a balanced intake and output will improve Outcome: Not Progressing   Problem: Health Behavior/Discharge Planning: Goal: Ability to identify and utilize available resources and services will improve Outcome: Not Progressing Goal: Ability to manage health-related needs will improve Outcome: Not Progressing   Problem: Metabolic: Goal: Ability to maintain appropriate glucose levels will improve Outcome: Not Progressing   Problem: Nutritional: Goal: Maintenance of adequate nutrition will improve Outcome: Not Progressing Goal: Progress toward achieving an optimal weight will improve Outcome: Not Progressing   Problem: Skin Integrity: Goal: Risk for impaired skin integrity will decrease Outcome: Not Progressing   Problem: Tissue Perfusion: Goal: Adequacy of tissue perfusion will improve Outcome: Not Progressing   Problem: Education: Goal: Knowledge of disease or condition will improve Outcome: Not Progressing Goal: Knowledge of secondary prevention will improve (MUST DOCUMENT ALL) Outcome: Not Progressing Goal: Knowledge of patient specific risk factors will improve (DELETE if not current risk factor) Outcome: Not Progressing   Problem: Ischemic Stroke/TIA Tissue Perfusion: Goal: Complications of ischemic stroke/TIA will be minimized Outcome: Not Progressing   Problem: Coping: Goal: Will verbalize positive feelings about self Outcome: Not Progressing Goal: Will identify appropriate support needs Outcome: Not Progressing   Problem: Health Behavior/Discharge Planning: Goal: Ability to  manage health-related needs will improve Outcome: Not Progressing Goal: Goals will be collaboratively established with patient/family Outcome: Not Progressing   Problem: Self-Care: Goal: Ability to participate in self-care as condition permits will improve Outcome: Not Progressing Goal: Verbalization of feelings and concerns over difficulty with self-care will improve Outcome: Not Progressing Goal: Ability to communicate needs accurately will  improve Outcome: Not Progressing   Problem: Nutrition: Goal: Risk of aspiration will decrease Outcome: Not Progressing Goal: Dietary intake will improve Outcome: Not Progressing   Problem: Safety: Goal: Non-violent Restraint(s) Outcome: Not Progressing   Problem: Education: Goal: Ability to demonstrate management of disease process will improve Outcome: Not Progressing Goal: Ability to verbalize understanding of medication therapies will improve Outcome: Not Progressing Goal: Individualized Educational Video(s) Outcome: Not Progressing   Problem: Activity: Goal: Capacity to carry out activities will improve Outcome: Not Progressing   Problem: Cardiac: Goal: Ability to achieve and maintain adequate cardiopulmonary perfusion will improve Outcome: Not Progressing

## 2024-01-25 NOTE — Progress Notes (Signed)
 Progress Note   Patient: Connie Shepard FMW:[id number] DOB: 02-22-1966 DOA: 01/14/2024     11 DOS: the patient was seen and examined on 01/25/2024   Brief hospital course: Connie Shepard is a 58 y.o. female with medical history significant for stroke with baseline Right hemiparesis, DM II, CHF, HTN, hypothyroidism. Patient was brought to the ED via EMS with reports of unresponsiveness, right gaze, and aphasia.   Code stroke called.  Patient outside window for thrombolysis.   Head CT-suggested an acute infarct. Subsequent MRI brain - Acute infarcts in the right corona radiata, caudate, lentiform nucleus, and external capsule. Extensive chronic microvascular ischemic changes, generalized parenchymal volume loss, and multiple remote infarcts. CTA head and neck-severe stenosis in several arteries. Tele-neurology was consulted, okay for patient to be admitted here at [facility].  Neurology recommended: aspirin , Plavix , x 3 months then continue with aspirin  325 daily. She failed swallow eval. She is awaiting PEG tube placement at [facility]. Dobhoff placed with left arm restraint, started meds, feeds. Awaiting to come off esmolol drip for her to go [facility] for PEG and subsequently to SNF upon dc.   Assessment & Plan:   Principal Problem:   Acute CVA (cerebrovascular accident) [facility]) Active Problems:   Essential hypertension   Diabetes mellitus (HCC)   Hypothyroidism   Chronic combined systolic and diastolic congestive heart failure (HCC)    Assessment and Plan: Acute CVA- POA: History of stroke, with baseline right hemiparesis  - MRI brain-  Acute infarcts in the right corona radiata, caudate, lentiform nucleus, and external capsule. Extensive chronic microvascular ischemic changes, generalized parenchymal volume loss, and multiple remote infarcts.   - CTA head and neck reviewed stenosis in multiple vessels. - Neurology consulted, recommended: aspirin , Plavix , x 3 months then continue with  aspirin  325 daily Remains aphasic, with right-sided hemiplegia, with right upper arm contracted Now moving left upper extremities, minimal left lower extremity. Severe dysphagia -status post extensive evaluation by speech advised PEG tube placement. She pulled her NG out several times. Restraints ordered with NG in place 01/22/24. Started diet, antihypertensive meds. Aspirin , Plavix  changed thru tube. Palliative care following, prognosis remain poor due to recurrent stroke-severe dysphagia. Family wished for DNR, if she declines leaning towards hospice. Awaiting better BP control off esmolol drip for IR placement of PEG tube. Subsequent plan to discharge to SNF with PEG tube once she reaches goal rate.  Hypertension- uncontrolled Not controlled with IV hydralazine  to 20mg  q4 prn, continue clonidine patch. Labetolol IV for very high BP, Enalapril scheduled doses. Unable to give PO, as she has no NG tube, failed SLP. Unable to give Nifedipine drip due to QT prolong issues. Started Cleviprex gtt for goal BP 160/90. She had rash over her chest, arm 01/21/2024. Continue to taper esmolol drip as per pharmacy protocol with goal BP less than 160/90. Continue Dobbhoff for medications, feeds. increased Coreg  to 37.5 bid, Losartan  to 100mg  daily, increase spironolactone  to 50 daily, continue clonidine.  Increased Hydralazine  to 50 mg Q8 hourly standing dose ordered.   Type 2 Diabetes mellitus A1c 12/02/2023-8.4. Sugars stable. Continue accucheks, sliding scale q6h with tube feeds. Hold home metformin , resume Lantus  if sugars elevated.     Chronic combined systolic and diastolic CHF stable and compensated.   Last echo 2022 EF 20% with global hypokinesis Repeat 2D Echo: EF estimated 25%, severe decreased LV function, finding consistent with Takotsubo cardiomyopathy.  Severe LVH Gentle hydration to be stopped once feeds started. Resume torsemide  40 mg twice  daily.  Ethics: Changed to DNR  limited Palliative care consulted, mom and dad present at bedside.  Recurrent stroke, with progressive neurological changes on the new stroke complicated by aphasia, dysphagia-therefore prognosis is poor.  If patient continues to decline family may consider hospice.  Peg tube planned at [facility] with IR, once BP better controlled.  Nursing supportive care. Fall, aspiration precautions. Diet:  Diet Orders (From admission, onward)     Start     Ordered   01/14/24 1329  Diet NPO time specified  Diet effective now       Comments: NPO until stroke swallow screen is complete   01/14/24 1329           DVT prophylaxis: enoxaparin  (LOVENOX ) injection 40 mg Start: 01/23/24 1000 SCDs Start: 01/23/24 0820  Level of care: ICU   Code Status: Limited: Do not attempt resuscitation (DNR) -DNR-LIMITED -Do Not Intubate/DNI   Subjective: Patient is seen and examined today morning.  She remains calm, BP better. Parents at bedside. No overnight issues.  Physical Exam: Vitals:   01/25/24 0930 01/25/24 0945 01/25/24 1000 01/25/24 1015  BP: (!) 155/91 (!) 146/89 (!) 162/92 (!) 161/92  Pulse: 75 74 77 80  Resp: 17 16 13 14   Temp:      TempSrc:      SpO2: 91% 92% 94% 94%  Weight:      Height:        General - Middle aged obese Caucasian female, no apparent distress HEENT - PERRLA, EOMI, atraumatic head, non tender sinuses. Lung -breath sounds, basal rales, rhonchi, no wheezes. Heart - S1, S2 heard, no murmurs, rubs, trace pedal edema. Abdomen - Soft, non tender obese, bowel sounds good. Neuro - alert, right hemiparesis, left hand restrained. Unable to do full neuro exam. Skin - Warm and dry.  Data Reviewed:      Latest Ref Rng & Units 01/25/2024    3:28 AM 01/24/2024    3:31 AM 01/23/2024    4:53 AM  CBC  WBC 4.0 - 10.5 K/uL 8.2  6.5  7.2   Hemoglobin 12.0 - 15.0 g/dL 83.4  84.4  84.2   Hematocrit 36.0 - 46.0 % 47.8  46.0  47.2   Platelets 150 - 400 K/uL 241  220  186       Latest Ref  Rng & Units 01/23/2024    4:53 AM 01/20/2024    7:49 AM 01/19/2024    6:07 AM  BMP  Glucose 70 - 99 mg/dL 97  867  867   BUN 6 - 20 mg/dL 19  13  13    Creatinine 0.44 - 1.00 mg/dL 8.79  9.02  9.07   Sodium 135 - 145 mmol/L 139  143  142   Potassium 3.5 - 5.1 mmol/L 4.4  3.2  3.9   Chloride 98 - 111 mmol/L 105  107  107   CO2 22 - 32 mmol/L 20  23  23    Calcium  8.9 - 10.3 mg/dL 9.4  9.3  89.9    No results found.    Family Communication: Discussed with patient's parents at bedside.  They understand and agree. All questions answered.  Disposition: Status is: Inpatient Remains inpatient appropriate because: NG for meds, feeds, once BP controlled off drip, PEG tube placement with IR at [facility].  Planned Discharge Destination: Skilled nursing facility     Time spent: 52 minutes  Author: Concepcion Riser, MD 01/25/2024 12:02 PM Secure chat 7am to 7pm For on call  review [email or web address].

## 2024-01-25 NOTE — Plan of Care (Signed)

## 2024-01-26 ENCOUNTER — Inpatient Hospital Stay (HOSPITAL_COMMUNITY)

## 2024-01-26 DIAGNOSIS — I10 Essential (primary) hypertension: Secondary | ICD-10-CM | POA: Diagnosis not present

## 2024-01-26 DIAGNOSIS — I639 Cerebral infarction, unspecified: Secondary | ICD-10-CM | POA: Diagnosis not present

## 2024-01-26 DIAGNOSIS — E039 Hypothyroidism, unspecified: Secondary | ICD-10-CM | POA: Diagnosis not present

## 2024-01-26 DIAGNOSIS — E1369 Other specified diabetes mellitus with other specified complication: Secondary | ICD-10-CM | POA: Diagnosis not present

## 2024-01-26 LAB — GLUCOSE, CAPILLARY
Glucose-Capillary: 224 mg/dL — ABNORMAL HIGH (ref 70–99)
Glucose-Capillary: 226 mg/dL — ABNORMAL HIGH (ref 70–99)
Glucose-Capillary: 246 mg/dL — ABNORMAL HIGH (ref 70–99)
Glucose-Capillary: 255 mg/dL — ABNORMAL HIGH (ref 70–99)
Glucose-Capillary: 322 mg/dL — ABNORMAL HIGH (ref 70–99)

## 2024-01-26 NOTE — Progress Notes (Signed)
 Patient has had a slow nose bleed since she tried to pull out NG tube from left nostril this afternoon. Tube has been placed back in position.

## 2024-01-26 NOTE — Plan of Care (Signed)
 This patient remains on [facility] as of time of writing. Neurological exam unchanged from last night (see flowsheet). NGT to LN remains in place at previously measured position. Plan for NPO after MN for potential PEG placement tomorrow.   Problem: Education: Goal: Knowledge of General Education information will improve Description: Including pain rating scale, medication(s)/side effects and non-pharmacologic comfort measures Outcome: Not Progressing   Problem: Health Behavior/Discharge Planning: Goal: Ability to manage health-related needs will improve Outcome: Not Progressing   Problem: Clinical Measurements: Goal: Ability to maintain clinical measurements within normal limits will improve Outcome: Not Progressing Goal: Will remain free from infection Outcome: Not Progressing Goal: Diagnostic test results will improve Outcome: Not Progressing Goal: Respiratory complications will improve Outcome: Not Progressing Goal: Cardiovascular complication will be avoided Outcome: Not Progressing   Problem: Activity: Goal: Risk for activity intolerance will decrease Outcome: Not Progressing   Problem: Nutrition: Goal: Adequate nutrition will be maintained Outcome: Not Progressing   Problem: Coping: Goal: Level of anxiety will decrease Outcome: Not Progressing   Problem: Elimination: Goal: Will not experience complications related to bowel motility Outcome: Not Progressing Goal: Will not experience complications related to urinary retention Outcome: Not Progressing   Problem: Pain Managment: Goal: General experience of comfort will improve and/or be controlled Outcome: Not Progressing   Problem: Safety: Goal: Ability to remain free from injury will improve Outcome: Not Progressing   Problem: Skin Integrity: Goal: Risk for impaired skin integrity will decrease Outcome: Not Progressing   Problem: Education: Goal: Ability to describe self-care measures that may prevent or  decrease complications (Diabetes Survival Skills Education) will improve Outcome: Not Progressing Goal: Individualized Educational Video(s) Outcome: Not Progressing   Problem: Coping: Goal: Ability to adjust to condition or change in health will improve Outcome: Not Progressing   Problem: Fluid Volume: Goal: Ability to maintain a balanced intake and output will improve Outcome: Not Progressing   Problem: Health Behavior/Discharge Planning: Goal: Ability to identify and utilize available resources and services will improve Outcome: Not Progressing Goal: Ability to manage health-related needs will improve Outcome: Not Progressing   Problem: Metabolic: Goal: Ability to maintain appropriate glucose levels will improve Outcome: Not Progressing   Problem: Nutritional: Goal: Maintenance of adequate nutrition will improve Outcome: Not Progressing Goal: Progress toward achieving an optimal weight will improve Outcome: Not Progressing   Problem: Skin Integrity: Goal: Risk for impaired skin integrity will decrease Outcome: Not Progressing   Problem: Tissue Perfusion: Goal: Adequacy of tissue perfusion will improve Outcome: Not Progressing   Problem: Education: Goal: Knowledge of disease or condition will improve Outcome: Not Progressing Goal: Knowledge of secondary prevention will improve (MUST DOCUMENT ALL) Outcome: Not Progressing Goal: Knowledge of patient specific risk factors will improve (DELETE if not current risk factor) Outcome: Not Progressing   Problem: Ischemic Stroke/TIA Tissue Perfusion: Goal: Complications of ischemic stroke/TIA will be minimized Outcome: Not Progressing   Problem: Coping: Goal: Will verbalize positive feelings about self Outcome: Not Progressing Goal: Will identify appropriate support needs Outcome: Not Progressing   Problem: Health Behavior/Discharge Planning: Goal: Ability to manage health-related needs will improve Outcome: Not  Progressing Goal: Goals will be collaboratively established with patient/family Outcome: Not Progressing   Problem: Self-Care: Goal: Ability to participate in self-care as condition permits will improve Outcome: Not Progressing Goal: Verbalization of feelings and concerns over difficulty with self-care will improve Outcome: Not Progressing Goal: Ability to communicate needs accurately will improve Outcome: Not Progressing   Problem: Nutrition: Goal: Risk of aspiration will decrease Outcome:  Not Progressing Goal: Dietary intake will improve Outcome: Not Progressing   Problem: Safety: Goal: Non-violent Restraint(s) Outcome: Not Progressing   Problem: Education: Goal: Ability to demonstrate management of disease process will improve Outcome: Not Progressing Goal: Ability to verbalize understanding of medication therapies will improve Outcome: Not Progressing Goal: Individualized Educational Video(s) Outcome: Not Progressing   Problem: Activity: Goal: Capacity to carry out activities will improve Outcome: Not Progressing   Problem: Cardiac: Goal: Ability to achieve and maintain adequate cardiopulmonary perfusion will improve Outcome: Not Progressing

## 2024-01-26 NOTE — Progress Notes (Signed)
 Patient ID: Connie Shepard, female   DOB: [birth year], 58 y.o.   MRN: [id number] IR made aware patient is now off esmolol drip and hemodynamically stable.  Will look to proceed with gtube placement early this week. Patient made NPO/hold tube feeds for possibility of placement tomorrow 10/13. If gtube can be placed tomorrow, will need to arrange transport from [facility] to [facility].   Kimble DEL Nasra Counce PA-C 01/26/2024 11:59 AM

## 2024-01-26 NOTE — Progress Notes (Signed)
 Progress Note   Patient: Connie Shepard FMW:[id number] DOB: May 02, 1965 DOA: 01/14/2024     12 DOS: the patient was seen and examined on 01/26/2024   Brief hospital course: Connie Shepard is a 58 y.o. female with medical history significant for stroke with baseline Right hemiparesis, DM II, CHF, HTN, hypothyroidism. Patient was brought to the ED via EMS with reports of unresponsiveness, right gaze, and aphasia.   Code stroke called.  Patient outside window for thrombolysis.   Head CT-suggested an acute infarct. Subsequent MRI brain - Acute infarcts in the right corona radiata, caudate, lentiform nucleus, and external capsule. Extensive chronic microvascular ischemic changes, generalized parenchymal volume loss, and multiple remote infarcts. CTA head and neck-severe stenosis in several arteries. Tele-neurology was consulted, okay for patient to be admitted here at [facility].  Neurology recommended: aspirin , Plavix , x 3 months then continue with aspirin  325 daily. She failed swallow eval. She is awaiting PEG tube placement at [facility]. Dobhoff placed with left arm restraint, started meds, feeds. Awaiting to come off esmolol drip for her to go [facility] campus for PEG and subsequently to SNF upon dc.  Parents has been updated at bedside and in agreement with plan of care.   Assessment & Plan:   Principal Problem:   Acute CVA (cerebrovascular accident) [facility]) Active Problems:   Essential hypertension   Diabetes mellitus (HCC)   Hypothyroidism   Chronic combined systolic and diastolic congestive heart failure (HCC)    Assessment and Plan: Acute CVA- POA: History of stroke, with baseline right hemiparesis  - MRI brain-  Acute infarcts in the right corona radiata, caudate, lentiform nucleus, and external capsule. Extensive chronic microvascular ischemic changes, generalized parenchymal volume loss, and multiple remote infarcts.   - CTA head and neck reviewed stenosis in multiple vessels. -  Neurology consulted, recommended: aspirin , Plavix , x 3 months then continue with aspirin  325 daily Remains aphasic, with right-sided hemiplegia, with right upper arm contracted Now moving left upper extremities, minimal left lower extremity. Severe dysphagia -status post extensive evaluation by speech advised PEG tube placement. She pulled her NG out several times. Restraints ordered with NG in place 01/22/24. Started diet, antihypertensive meds. Aspirin , Plavix  changed thru tube. Palliative care following, prognosis remain poor due to recurrent stroke-severe dysphagia. Family wished for DNR, if she declines leaning towards hospice. Awaiting PEG tube placement by IR prior to discharge to skilled nursing facility. Subsequent plan to discharge to SNF with PEG tube once she reaches goal rate.  Hypertension- uncontrolled Not controlled with IV hydralazine  to 20mg  q4 prn, continue clonidine patch. Labetolol IV for very high BP, Enalapril scheduled doses. Unable to give PO, as she has no NG tube, failed SLP. Unable to give Nifedipine drip due to QT prolong issues. Esmolol drip discontinue with stable blood pressure. Continue Dobbhoff for medications, feeds. increased Coreg  to 37.5 bid, Losartan  to 100mg  daily, increase spironolactone  to 50 daily, continue clonidine.  Increased Hydralazine  to 50 mg Q8 hourly standing dose ordered.   Type 2 Diabetes mellitus A1c 12/02/2023-8.4. Sugars stable. Continue accucheks, sliding scale q6h with tube feeds. Hold home metformin , resume Lantus  if sugars elevated.     Chronic combined systolic and diastolic CHF stable and compensated.   Last echo 2022 EF 20% with global hypokinesis Repeat 2D Echo: EF estimated 25%, severe decreased LV function, finding consistent with Takotsubo cardiomyopathy.  Severe LVH Gentle hydration to be stopped once feeds started. Continue torsemide  40 mg twice daily.  Ethics: Changed to DNR limited Palliative care  consulted, mom and  dad present at bedside.  Recurrent stroke, with progressive neurological changes on the new stroke complicated by aphasia, dysphagia-therefore prognosis is poor.  If patient continues to decline family may consider hospice.  Peg tube planned at [facility] with IR on 01/27/2024 now that blood pressure is controlled.  Nursing supportive care. Fall, aspiration precautions. Diet:  Diet Orders (From admission, onward)     Start     Ordered   01/14/24 1329  Diet NPO time specified  Diet effective now       Comments: NPO until stroke swallow screen is complete   01/14/24 1329           DVT prophylaxis: enoxaparin  (LOVENOX ) injection 40 mg Start: 01/23/24 1000 SCDs Start: 01/23/24 0820   Level of care: ICU   Code Status: Limited: Do not attempt resuscitation (DNR) -DNR-LIMITED -Do Not Intubate/DNI   Subjective:  No fever, no chest pain, no nausea vomiting.  Significant improvement in patient's Blood pressure; currently of esmolol drip.  Physical Exam: Vitals:   01/26/24 0700 01/26/24 0753 01/26/24 0800 01/26/24 0900  BP: 127/84  110/68 (!) 130/92  Pulse: 78  78 73  Resp: 16  16 12   Temp:  98.3 F (36.8 C)    TempSrc:  Axillary    SpO2: 94%  94% 96%  Weight:      Height:       General exam: Sleepy at time of exam; NG tube in place.  No fever, no nausea vomiting. Respiratory system: No using accessory muscles.  Good saturation on room air. Cardiovascular system:RRR. No rubs or gallops; no JVD. Gastrointestinal system: Abdomen is obese, nondistended, soft and nontender. No organomegaly or masses felt. Normal bowel sounds heard. Central nervous system: No new focal neurologic deficit.  Patient with residual right hemiparesis. Extremities: No cyanosis or clubbing. Skin: No petechiae. Psychiatry: Stable mood.  Limited examination given underlying stroke.  Data Reviewed:      Latest Ref Rng & Units 01/25/2024    3:28 AM 01/24/2024    3:31 AM 01/23/2024    4:53 AM  CBC  WBC 4.0 -  10.5 K/uL 8.2  6.5  7.2   Hemoglobin 12.0 - 15.0 g/dL 83.4  84.4  84.2   Hematocrit 36.0 - 46.0 % 47.8  46.0  47.2   Platelets 150 - 400 K/uL 241  220  186       Latest Ref Rng & Units 01/23/2024    4:53 AM 01/20/2024    7:49 AM 01/19/2024    6:07 AM  BMP  Glucose 70 - 99 mg/dL 97  867  867   BUN 6 - 20 mg/dL 19  13  13    Creatinine 0.44 - 1.00 mg/dL 8.79  9.02  9.07   Sodium 135 - 145 mmol/L 139  143  142   Potassium 3.5 - 5.1 mmol/L 4.4  3.2  3.9   Chloride 98 - 111 mmol/L 105  107  107   CO2 22 - 32 mmol/L 20  23  23    Calcium  8.9 - 10.3 mg/dL 9.4  9.3  89.9    No results found.    Family Communication: Discussed with patient's parents at bedside.  They understand and agree. All questions answered.  (01/26/2024).  Disposition: Status is: Inpatient Remains inpatient appropriate because: NG for meds, feeds, now the patient blood pressure has been controlled off esmolol drip will add temp PEG tube placement by IR prior to skilled nursing facility discharge.  Planned Discharge Destination: Skilled nursing facility  Time spent: 50 minutes  Author: Eric Nunnery, MD 01/26/2024 10:49 AM Secure chat 7am to 7pm For on call review [email or web address].

## 2024-01-27 DIAGNOSIS — I10 Essential (primary) hypertension: Secondary | ICD-10-CM | POA: Diagnosis not present

## 2024-01-27 DIAGNOSIS — I639 Cerebral infarction, unspecified: Secondary | ICD-10-CM | POA: Diagnosis not present

## 2024-01-27 DIAGNOSIS — E1369 Other specified diabetes mellitus with other specified complication: Secondary | ICD-10-CM | POA: Diagnosis not present

## 2024-01-27 DIAGNOSIS — E039 Hypothyroidism, unspecified: Secondary | ICD-10-CM | POA: Diagnosis not present

## 2024-01-27 LAB — COMPREHENSIVE METABOLIC PANEL WITH GFR
ALT: 9 U/L (ref 0–44)
AST: 13 U/L — ABNORMAL LOW (ref 15–41)
Albumin: 3.7 g/dL (ref 3.5–5.0)
Alkaline Phosphatase: 79 U/L (ref 38–126)
Anion gap: 12 (ref 5–15)
BUN: 48 mg/dL — ABNORMAL HIGH (ref 6–20)
CO2: 29 mmol/L (ref 22–32)
Calcium: 10.4 mg/dL — ABNORMAL HIGH (ref 8.9–10.3)
Chloride: 96 mmol/L — ABNORMAL LOW (ref 98–111)
Creatinine, Ser: 1.54 mg/dL — ABNORMAL HIGH (ref 0.44–1.00)
GFR, Estimated: 39 mL/min — ABNORMAL LOW (ref >60)
Glucose, Bld: 178 mg/dL — ABNORMAL HIGH (ref 70–99)
Potassium: 3.4 mmol/L — ABNORMAL LOW (ref 3.5–5.1)
Sodium: 138 mmol/L (ref 135–145)
Total Bilirubin: 0.6 mg/dL (ref 0.0–1.2)
Total Protein: 7.4 g/dL (ref 6.5–8.1)

## 2024-01-27 LAB — GLUCOSE, CAPILLARY
Glucose-Capillary: 170 mg/dL — ABNORMAL HIGH (ref 70–99)
Glucose-Capillary: 194 mg/dL — ABNORMAL HIGH (ref 70–99)
Glucose-Capillary: 207 mg/dL — ABNORMAL HIGH (ref 70–99)
Glucose-Capillary: 259 mg/dL — ABNORMAL HIGH (ref 70–99)
Glucose-Capillary: 281 mg/dL — ABNORMAL HIGH (ref 70–99)

## 2024-01-27 LAB — PROTIME-INR
INR: 0.9 (ref 0.8–1.2)
Prothrombin Time: 12.3 s (ref 11.4–15.2)

## 2024-01-27 LAB — MAGNESIUM: Magnesium: 2.3 mg/dL (ref 1.7–2.4)

## 2024-01-27 LAB — PHOSPHORUS: Phosphorus: 2.4 mg/dL — ABNORMAL LOW (ref 2.5–4.6)

## 2024-01-27 MED ORDER — FREE WATER
100.0000 mL | Freq: Two times a day (BID) | Status: DC
  Administered 2024-01-27 – 2024-01-29 (×5): 100 mL

## 2024-01-27 MED ORDER — TORSEMIDE 20 MG PO TABS
60.0000 mg | ORAL_TABLET | Freq: Every day | ORAL | Status: DC
  Administered 2024-01-28 – 2024-01-29 (×2): 60 mg via NASOGASTRIC
  Filled 2024-01-27 (×2): qty 3

## 2024-01-27 NOTE — Progress Notes (Signed)
 Speech Language Pathology Treatment: Dysphagia  Patient Details Name: Connie Shepard MRN: [id number] DOB: Aug 15, 1965 Today's Date: 01/27/2024 Time: 8364-8340 SLP Time Calculation (min) (ACUTE ONLY): 24 min  Assessment / Plan / Recommendation Clinical Impression  Pt seen at bedside for ongoing dysphagia intervention. Pt has NG in place and is scheduled for PEG tomorrow as her BP is under better control. She did not open her eyes, but did allow oral care. Pt with extreme halitosis and oral care is paramount given her NPO status. Pt accepted single ice chips and a couple of small bites of orange sherbet which elicit multiple swallows. Pt given a small amount of thin water via the end of an occluded straw; delay in swallow trigger elicited followed by immediate strong cough. Oral suction completed. Pt is unable to sustain sufficient alertness for PO intake (or MBSS at this point). She will need alternative means of nutrition until she can maintain alertness. Continue NPO with oral care 4x per day. She did not have cleaner in room and SLP placed in room and alerted nursing. SLP will continue to follow.     HPI HPI: Connie Shepard is a 58 y.o. female with medical history significant for stroke with baseline right hemiparesis, diabetes mellitus, CHF, hypertension, hypothyroidism. Patient was brought to the ED via EMS with reports of unresponsiveness, right gaze, and aphasia. Family reported that patient was last known well at about 8 PM last night when she went to bed. This morning when she woke up she is not feeling well and was not talking much at all, it was about 12:30 PM after she woke up from a nap and I did notice that she was not talking at all. EMS noted that she was gazing to the right on the following directions. BSE recommendations for NPO. ST f/u for speech/language and dysphagia tx. PEG pending d/t medical concenrs (elevated BP) delaying procedure. ST f/u for dysphagia management/tx.      SLP  Plan  Continue with current plan of care          Recommendations  Diet recommendations: NPO Medication Administration: Via alternative means                  Oral care QID;Oral care prior to ice chip/H20   Frequent or constant Supervision/Assistance Dysphagia, oropharyngeal phase (R13.12)     Continue with current plan of care    Thank you,  Lamar Candy, CCC-SLP [phone]  Mikea Quadros  01/27/2024, 5:16 PM

## 2024-01-27 NOTE — Progress Notes (Signed)
 Progress Note   Patient: Connie Shepard FMW:[id number] DOB: 09-02-1965 DOA: 01/14/2024     13 DOS: the patient was seen and examined on 01/27/2024   Brief hospital course: Connie Shepard is a 58 y.o. female with medical history significant for stroke with baseline Right hemiparesis, DM II, CHF, HTN, hypothyroidism. Patient was brought to the ED via EMS with reports of unresponsiveness, right gaze, and aphasia.   Code stroke called.  Patient outside window for thrombolysis.   Head CT-suggested an acute infarct. Subsequent MRI brain - Acute infarcts in the right corona radiata, caudate, lentiform nucleus, and external capsule. Extensive chronic microvascular ischemic changes, generalized parenchymal volume loss, and multiple remote infarcts. CTA head and neck-severe stenosis in several arteries. Tele-neurology was consulted, okay for patient to be admitted here at [facility].  Neurology recommended: aspirin , Plavix , x 3 months then continue with aspirin  325 daily. She failed swallow eval. She is awaiting PEG tube placement at [facility]. Dobhoff placed with left arm restraint, started meds, feeds. Awaiting to come off esmolol drip for her to go [facility] for PEG and subsequently to SNF upon dc.  Parents has been updated at bedside and in agreement with plan of care.   Assessment & Plan:   Principal Problem:   Acute CVA (cerebrovascular accident) [facility]) Active Problems:   Essential hypertension   Diabetes mellitus (HCC)   Hypothyroidism   Chronic combined systolic and diastolic congestive heart failure (HCC)    Assessment and Plan: Acute CVA- POA: History of stroke, with baseline right hemiparesis  - MRI brain-  Acute infarcts in the right corona radiata, caudate, lentiform nucleus, and external capsule. Extensive chronic microvascular ischemic changes, generalized parenchymal volume loss, and multiple remote infarcts.   - CTA head and neck reviewed stenosis in multiple vessels. -  Neurology consulted, recommended: aspirin , Plavix , x 3 months then continue with aspirin  325 daily Remains aphasic, with right-sided hemiplegia, with right upper arm contracted Now moving left upper extremities, minimal left lower extremity. Severe dysphagia -status post extensive evaluation by speech advised PEG tube placement. She pulled her NG out several times. Restraints ordered with NG in place 01/22/24. Started diet, antihypertensive meds. Aspirin , Plavix  changed thru tube. Palliative care following, prognosis remain poor due to recurrent stroke-severe dysphagia. Family wished for DNR, if she declines leaning towards hospice. Awaiting PEG tube placement by IR prior to discharge to skilled nursing facility. Subsequent plan to discharge to SNF with PEG tube once in place. - Per report PEG tube placement most likely 01/28/2024 by IR.  Hypertension- uncontrolled Not controlled with IV hydralazine  to 20mg  q4 prn, continue clonidine patch. Labetolol IV for very high BP, Enalapril scheduled doses. Unable to give PO, as she has no NG tube, failed SLP. Unable to give Nifedipine drip due to QT prolong issues. Esmolol drip discontinue with stable blood pressure. Continue Dobbhoff for medications, feeds. increased Coreg  to 37.5 bid, Losartan  to 100mg  daily, increase spironolactone  to 50 daily, continue clonidine.  Increased Hydralazine  to 50 mg Q8 hourly standing dose ordered.   Type 2 Diabetes mellitus A1c 12/02/2023-8.4. Sugars stable. Continue accucheks, sliding scale q6h with tube feeds. Hold home metformin , resume Lantus  if sugars elevated.     Chronic combined systolic and diastolic CHF stable and compensated.   Last echo 2022 EF 20% with global hypokinesis Repeat 2D Echo: EF estimated 25%, severe decreased LV function, finding consistent with Takotsubo cardiomyopathy.  Severe LVH Gentle hydration to be stopped once feeds started. Continue torsemide  40 mg twice  daily.  Ethics: Changed  to DNR limited Palliative care consulted, mom and dad present at bedside.  Recurrent stroke, with progressive neurological changes on the new stroke complicated by aphasia, dysphagia-therefore prognosis is poor.  If patient continues to decline family may consider hospice.  Peg tube planned at [facility] with IR on 01/27/2024 now that blood pressure is controlled.  Nursing supportive care. Fall, aspiration precautions. Diet:  Diet Orders (From admission, onward)     Start     Ordered   01/27/24 0001  Diet NPO time specified  Diet effective midnight        01/26/24 1145           DVT prophylaxis: enoxaparin  (LOVENOX ) injection 40 mg Start: 01/23/24 1000 SCDs Start: 01/23/24 0820   Level of care: Stepdown   Code Status: Limited: Do not attempt resuscitation (DNR) -DNR-LIMITED -Do Not Intubate/DNI   Subjective:  No fever, no chest pain, no nausea, no vomiting.  Patient left upper extremity frustration got loose and she was able to pull slightly her NG tube; x-rays order to verify placement and NG tube advance/secure.  No other events.  Physical Exam: Vitals:   01/27/24 0500 01/27/24 0600 01/27/24 0700 01/27/24 0750  BP: 99/64 137/82 93/60   Pulse: 80 80 73   Resp: 19 13 18    Temp:    98.2 F (36.8 C)  TempSrc:    Oral  SpO2: 92% 92% 92%   Weight:      Height:       General exam: Afebrile; no nausea, no vomiting, no chest pain.  Good saturation on room air.  NG tube in place. Respiratory system: No wheezing, no crackles, no using accessory muscles. Cardiovascular system:RRR. No rubs or gallops; no JVD on exam. Gastrointestinal system: Abdomen is obese, nondistended, soft and nontender. No organomegaly or masses felt. Normal bowel sounds heard. Central nervous system: No new focal neurological deficits. Extremities: No cyanosis or clubbing. Skin: No petechiae. Psychiatry: Stable mood.  Data Reviewed:      Latest Ref Rng & Units 01/25/2024    3:28 AM 01/24/2024    3:31 AM  01/23/2024    4:53 AM  CBC  WBC 4.0 - 10.5 K/uL 8.2  6.5  7.2   Hemoglobin 12.0 - 15.0 g/dL 83.4  84.4  84.2   Hematocrit 36.0 - 46.0 % 47.8  46.0  47.2   Platelets 150 - 400 K/uL 241  220  186       Latest Ref Rng & Units 01/27/2024    7:00 AM 01/23/2024    4:53 AM 01/20/2024    7:49 AM  BMP  Glucose 70 - 99 mg/dL 821  97  867   BUN 6 - 20 mg/dL 48  19  13   Creatinine 0.44 - 1.00 mg/dL 8.45  8.79  9.02   Sodium 135 - 145 mmol/L 138  139  143   Potassium 3.5 - 5.1 mmol/L 3.4  4.4  3.2   Chloride 98 - 111 mmol/L 96  105  107   CO2 22 - 32 mmol/L 29  20  23    Calcium  8.9 - 10.3 mg/dL 89.5  9.4  9.3    DG CHEST PORT 1 VIEW Result Date: 01/26/2024 CLINICAL DATA:  Nasogastric tube placement. EXAM: PORTABLE CHEST 1 VIEW COMPARISON:  Abdominal film 01/22/2024 FINDINGS: Mild cardiac enlargement. Low bilateral lung volumes with chronic elevation of the right hemidiaphragm. No overt pulmonary edema, airspace disease, pneumothorax or pleural fluid. Nasogastric  tube is coiled in the fundus of the stomach. IMPRESSION: Nasogastric tube coiled in the fundus of the stomach. Electronically Signed   By: Marcey Moan M.D.   On: 01/26/2024 17:06    Family Communication: Discussed with patient's parents at bedside.  They understand and agree. All questions answered.  (01/26/2024).  Disposition: Status is: Inpatient Remains inpatient appropriate because: NG for meds, feeds, now the patient blood pressure has been controlled off esmolol drip will add temp PEG tube placement by IR prior to skilled nursing facility discharge.   Planned Discharge Destination: Skilled nursing facility  Time spent: 50 minutes  Author: Eric Nunnery, MD 01/27/2024 8:51 AM Secure chat 7am to 7pm For on call review [email or web address].

## 2024-01-27 NOTE — Plan of Care (Signed)
  Problem: Clinical Measurements: Goal: Ability to maintain clinical measurements within normal limits will improve Outcome: Progressing Goal: Will remain free from infection Outcome: Progressing Goal: Diagnostic test results will improve Outcome: Progressing Goal: Respiratory complications will improve Outcome: Progressing Goal: Cardiovascular complication will be avoided Outcome: Progressing   Problem: Nutrition: Goal: Adequate nutrition will be maintained Outcome: Progressing   Problem: Coping: Goal: Level of anxiety will decrease Outcome: Progressing   Problem: Elimination: Goal: Will not experience complications related to urinary retention Outcome: Progressing   Problem: Ischemic Stroke/TIA Tissue Perfusion: Goal: Complications of ischemic stroke/TIA will be minimized Outcome: Progressing

## 2024-01-27 NOTE — Inpatient Diabetes Management (Signed)
 Inpatient Diabetes Program Recommendations  AACE/ADA: New Consensus Statement on Inpatient Glycemic Control   Target Ranges:  Prepandial:   less than 140 mg/dL      Peak postprandial:   less than 180 mg/dL (1-2 hours)      Critically ill patients:  140 - 180 mg/dL    Latest Reference Range & Units 01/26/24 00:27 01/26/24 05:37 01/26/24 11:29 01/26/24 16:37 01/26/24 23:55 01/27/24 06:02 01/27/24 08:45  Glucose-Capillary 70 - 99 mg/dL 775 (H) 744 (H) 677 (H) 226 (H) 246 (H) 207 (H) 170 (H)   Review of Glycemic Control  Diabetes history: DM2 Outpatient Diabetes medications: Novolog  0-20 units TID with meals, Metformin  500 mg QAM Current orders for Inpatient glycemic control: Lantus  10 units at bedtime, Novolog  0-9 units Q6H; Osmolite @ 45 ml/hr  Inpatient Diabetes Program Recommendations:    Insulin : Please consider changing CBGs to Q4H, Novolog  0-9 units to Q4H, and ordering Novolog  4 units Q4H for tube feeding coverage. If tube feeding is stopped or held then Novolog  tube feeding coverage should also be stopped or held.  Thanks, Earnie Gainer, RN, MSN, CDCES Diabetes Coordinator Inpatient Diabetes Program [phone] (Team Pager from 8am to 5pm)

## 2024-01-27 NOTE — Progress Notes (Signed)
 Interventional Radiology Brief Note:  Patient has successfully weaned from esomolol gtt with stable BP.  Remains on DAPT.   RN to set up [facility] transport for round-trip to [facility] Rad[facility] tomorrow AM.  Patient to arrive no later than 9am.   Hold TF p MN.   Gadiel John, MS RD PA-C

## 2024-01-27 NOTE — Progress Notes (Signed)
   01/27/24 1232  Spiritual Encounters  Type of Visit Initial  Care provided to: Pt and family (Patient parents present)  Referral source IDT Rounds  Reason for visit Urgent spiritual support  OnCall Visit No  Spiritual Framework  Presenting Themes Meaning/purpose/sources of inspiration;Impactful experiences and emotions;Courage hope and growth;Significant life change  Values/beliefs Patient father is a retired Engineering geologist and he remains as positive for [employer]  Community/Connection Family;Faith community  Strengths They have good spiritual resources for coping  Patient Stress Factors None identified  Family Stress Factors Health changes;Loss;Major life changes  Interventions  Spiritual Care Interventions Made Established relationship of care and support;Compassionate presence;Reflective listening;Normalization of emotions;Meaning making;Encouragement;Supported grief process;Prayer  Intervention Outcomes  Outcomes Connection to spiritual care;Awareness of support;Awareness of health;Patient family open to resources;Awareness around self/spiritual resourses  Spiritual Care Plan  Spiritual Care Issues Still Outstanding Chaplain will continue to follow  Follow up plan  Chaplain will remain available in order to provide spiritual support and to assess for spiritual need.   Met with patient parents who were bedside this morning. Patient was in/out of sleep. Engaged them in reflection around patient upbringing, relationships, spiritual heritage, and illness story. Provided comfort and encouragement to them both. They are very supportive of their daughter and are hopeful that she will get better. They are fervently praying for 'God's Will' to be done and are remaining as positive as possible. Chaplain will continue to visit and remain available in order to provide spiritual support and to assess for spiritual need.   Rev. Ether Blush, MDIV Chaplain [phone]

## 2024-01-28 ENCOUNTER — Ambulatory Visit (HOSPITAL_COMMUNITY)
Admit: 2024-01-28 | Discharge: 2024-01-28 | Disposition: A | Discharge status: Home / Self Care | Attending: Family Medicine | Admitting: Family Medicine

## 2024-01-28 DIAGNOSIS — I5042 Chronic combined systolic (congestive) and diastolic (congestive) heart failure: Secondary | ICD-10-CM | POA: Insufficient documentation

## 2024-01-28 DIAGNOSIS — I639 Cerebral infarction, unspecified: Secondary | ICD-10-CM | POA: Insufficient documentation

## 2024-01-28 DIAGNOSIS — E119 Type 2 diabetes mellitus without complications: Secondary | ICD-10-CM | POA: Insufficient documentation

## 2024-01-28 DIAGNOSIS — I110 Hypertensive heart disease with heart failure: Secondary | ICD-10-CM | POA: Insufficient documentation

## 2024-01-28 DIAGNOSIS — Z79899 Other long term (current) drug therapy: Secondary | ICD-10-CM | POA: Insufficient documentation

## 2024-01-28 DIAGNOSIS — E1369 Other specified diabetes mellitus with other specified complication: Secondary | ICD-10-CM | POA: Diagnosis not present

## 2024-01-28 DIAGNOSIS — R1310 Dysphagia, unspecified: Secondary | ICD-10-CM | POA: Insufficient documentation

## 2024-01-28 DIAGNOSIS — Z66 Do not resuscitate: Secondary | ICD-10-CM | POA: Insufficient documentation

## 2024-01-28 DIAGNOSIS — I10 Essential (primary) hypertension: Secondary | ICD-10-CM | POA: Diagnosis not present

## 2024-01-28 DIAGNOSIS — E039 Hypothyroidism, unspecified: Secondary | ICD-10-CM | POA: Insufficient documentation

## 2024-01-28 HISTORY — PX: IR GASTROSTOMY TUBE MOD SED: IMG625

## 2024-01-28 LAB — GLUCOSE, CAPILLARY
Glucose-Capillary: 184 mg/dL — ABNORMAL HIGH (ref 70–99)
Glucose-Capillary: 255 mg/dL — ABNORMAL HIGH (ref 70–99)
Glucose-Capillary: 284 mg/dL — ABNORMAL HIGH (ref 70–99)
Glucose-Capillary: 252 mg/dL — ABNORMAL HIGH (ref 70–99)
Glucose-Capillary: 274 mg/dL — ABNORMAL HIGH (ref 70–99)

## 2024-01-28 MED ORDER — INSULIN ASPART 100 UNIT/ML IJ SOLN
4.0000 [IU] | INTRAMUSCULAR | Status: DC
  Administered 2024-01-28 – 2024-01-29 (×5): 4 [IU] via SUBCUTANEOUS

## 2024-01-28 MED ORDER — MIDAZOLAM HCL 2 MG/2ML IJ SOLN
INTRAMUSCULAR | Status: AC | PRN
  Administered 2024-01-28: .5 mg via INTRAVENOUS

## 2024-01-28 MED ORDER — LIDOCAINE-EPINEPHRINE 1 %-1:100000 IJ SOLN
INTRAMUSCULAR | Status: AC
  Filled 2024-01-28: qty 1

## 2024-01-28 MED ORDER — FENTANYL CITRATE (PF) 100 MCG/2ML IJ SOLN
INTRAMUSCULAR | Status: AC
  Filled 2024-01-28: qty 2

## 2024-01-28 MED ORDER — GLUCAGON HCL RDNA (DIAGNOSTIC) 1 MG IJ SOLR
INTRAMUSCULAR | Status: AC | PRN
  Administered 2024-01-28: 1 mg via INTRAVENOUS

## 2024-01-28 MED ORDER — ENALAPRILAT 1.25 MG/ML IV SOLN
0.6250 mg | Freq: Four times a day (QID) | INTRAVENOUS | Status: DC
  Administered 2024-01-28 – 2024-01-29 (×4): 0.625 mg via INTRAVENOUS
  Filled 2024-01-28 (×9): qty 0.5

## 2024-01-28 MED ORDER — IOHEXOL 300 MG/ML  SOLN
100.0000 mL | Freq: Once | INTRAMUSCULAR | Status: AC | PRN
  Administered 2024-01-28: 15 mL

## 2024-01-28 MED ORDER — CARVEDILOL 12.5 MG PO TABS
25.0000 mg | ORAL_TABLET | Freq: Two times a day (BID) | ORAL | Status: DC
  Administered 2024-01-28 – 2024-01-29 (×2): 25 mg
  Filled 2024-01-28 (×2): qty 2

## 2024-01-28 MED ORDER — JEVITY 1.5 CAL/FIBER PO LIQD
1000.0000 mL | ORAL | Status: DC
  Filled 2024-01-28 (×2): qty 1000

## 2024-01-28 MED ORDER — GLUCAGON HCL RDNA (DIAGNOSTIC) 1 MG IJ SOLR
INTRAMUSCULAR | Status: AC
  Filled 2024-01-28: qty 1

## 2024-01-28 MED ORDER — CEFAZOLIN SODIUM-DEXTROSE 2-4 GM/100ML-% IV SOLN
INTRAVENOUS | Status: AC | PRN
  Administered 2024-01-28: 2 g via INTRAVENOUS

## 2024-01-28 MED ORDER — LIDOCAINE-EPINEPHRINE 1 %-1:100000 IJ SOLN
20.0000 mL | Freq: Once | INTRAMUSCULAR | Status: AC
  Administered 2024-01-28: 15 mL via INTRADERMAL

## 2024-01-28 MED ORDER — CEFAZOLIN SODIUM-DEXTROSE 2-4 GM/100ML-% IV SOLN
INTRAVENOUS | Status: AC
  Filled 2024-01-28: qty 100

## 2024-01-28 MED ORDER — MIDAZOLAM HCL 2 MG/2ML IJ SOLN
INTRAMUSCULAR | Status: AC
  Filled 2024-01-28: qty 2

## 2024-01-28 MED ORDER — FENTANYL CITRATE (PF) 100 MCG/2ML IJ SOLN
INTRAMUSCULAR | Status: AC | PRN
  Administered 2024-01-28: 25 ug via INTRAVENOUS

## 2024-01-28 NOTE — Progress Notes (Addendum)
 Patient back in ICU 11 from [facility] IR. G tube in place with abdominal binder. Left wrist restraint discontinued. NG tube discontinued. [name] called to make aware patient has returned.

## 2024-01-28 NOTE — Progress Notes (Signed)
 PT Cancellation Note  Patient Details Name: Connie Shepard MRN: [id number] DOB: 08/16/1965   Cancelled Treatment:    Reason Eval/Treat Not Completed: Patient at procedure or test/unavailable. Patient at [facility] for GT placement.   2:42 PM, 01/28/24 Lynwood Music, MPT Physical Therapist with [facility] [phone] office [phone] mobile phone

## 2024-01-28 NOTE — Sedation Documentation (Addendum)
 Verbal order Dr Jenna placed abdominal binder to protect G-tube.

## 2024-01-28 NOTE — Procedures (Signed)
 Interventional Radiology Procedure Note  Procedure: G tube placement  Complications: None  Estimated Blood Loss: < 10 mL  Findings: 18Fr  G tube placed percutaneously.  Connie DELENA Banner, MD

## 2024-01-28 NOTE — Inpatient Diabetes Management (Signed)
 Inpatient Diabetes Program Recommendations  AACE/ADA: New Consensus Statement on Inpatient Glycemic Control   Target Ranges:  Prepandial:   less than 140 mg/dL      Peak postprandial:   less than 180 mg/dL (1-2 hours)      Critically ill patients:  140 - 180 mg/dL    Latest Reference Range & Units 01/27/24 06:02 01/27/24 08:45 01/27/24 11:26 01/27/24 17:43 01/27/24 21:29 01/27/24 23:42 01/28/24 06:16  Glucose-Capillary 70 - 99 mg/dL 792 (H)  Novolog  3 units 170 (H) 194 (H)  Novolog  2 units 259 (H)  Novolog  5 units    Lantus  10 units 281 (H)  Novolog  5 units 184 (H)  Novolog  2 units   Review of Glycemic Control  Diabetes history: DM2 Outpatient Diabetes medications: Novolog  0-20 units TID with meals, Metformin  500 mg QAM Current orders for Inpatient glycemic control: Lantus  10 units at bedtime, Novolog  0-9 units Q6H; Osmolite @ 45 ml/hr   Inpatient Diabetes Program Recommendations:     Insulin : Please consider changing CBGs to Q4H, Novolog  0-9 units to Q4H, and ordering Novolog  4 units Q4H for tube feeding coverage. If tube feeding is stopped or held then Novolog  tube feeding coverage should also be stopped or held.   Thanks, Earnie Gainer, RN, MSN, CDCES Diabetes Coordinator Inpatient Diabetes Program [phone] (Team Pager from 8am to 5pm)

## 2024-01-28 NOTE — Progress Notes (Signed)
 SLP Cancellation Note  Patient Details Name: Connie Shepard MRN: [id number] DOB: [birth year]   Cancelled treatment:       Reason Eval/Treat Not Completed: Patient at procedure or test/unavailable; pt transferred to [facility] for PEG placement; will continue efforts when she returns with dysphagia tx.   Pat Marionna Gonia,M.S.,CCC-SLP 01/28/2024, 10:47 AM

## 2024-01-28 NOTE — Progress Notes (Signed)
 Nutrition Follow-up   DOCUMENTATION CODES:   Non-severe (moderate) malnutrition in context of acute illness/injury  INTERVENTION:   Tube feeding via PEG tube: Change to a fiber containing formula: Jevity 1.5 at 45 ml/h (1080 ml per day). Prosource TF20 60 ml once daily. Provides 1700 kcal, 89 gm protein, 821 ml free water daily. Free water flushes 100 ml q12h provides an additional 200 ml free water per day.  Continue MVI with minerals daily via tube.  Recommend re-check K, Phos, and Mag to ensure they are WNL d/t refeeding risk.   If bolus TF is desired, recommend:  Jevity 1.5 270 ml QID (start with 120 ml, increase each bolus by 50 ml until reaching goal of 270 ml).  Prosource TF 20 60 ml once daily. Free water flushes 60 ml before and after each bolus feeding. Provides 1700 kcal, 89 gm protein, 1301 ml free water daily.  NUTRITION DIAGNOSIS:   Moderate Malnutrition related to acute illness (acute CVA) as evidenced by energy intake < 75% for > 7 days, mild muscle depletion; ongoing.   GOAL:   Patient will meet greater than or equal to 90% of their needs; met with TF.  MONITOR:   TF tolerance  REASON FOR ASSESSMENT:   Consult New TPN/TNA  ASSESSMENT:   Pt with medical history significant for stroke with baseline right hemiparesis, diabetes mellitus, CHF, hypertension, hypothyroidism. Pt admitted with with reports of unresponsiveness, right gaze, and aphasia.  TF was stopped last night for PEG placement today, which was completed this morning by IR at [facility]. TF has been resumed: Osmolite 1.5 at goal rate of 45 ml/h with Prosource TF20 60 ml daily. This meets 100% of patient's estimated protein and calorie needs. Given hx of DM and elevated glucoses, will change to a fiber containing formula.   Labs reviewed.  K 3.4 (10/13) Phos 2.4 (10/13) CBG: [phone]  Low K and Phos likely r/t refeeding syndrome. Need to recheck to ensure they are WNL now.   Medications  reviewed and include novolog , lantus , MVI with minerals, aldactone .  Admit weight 83.1 kg (9/30) Current weight 83.8 kg (10/14)  Diet Order:   Diet Order             Diet NPO time specified  Diet effective midnight                   EDUCATION NEEDS:   No education needs have been identified at this time  Skin:  Skin Assessment: Reviewed RN Assessment  Last BM:  10/12  Height:   Ht Readings from Last 1 Encounters:  01/14/24 5' 5 (1.651 m)    Weight:   Wt Readings from Last 1 Encounters:  01/28/24 83.8 kg    Ideal Body Weight:  56.8 kg  BMI:  Body mass index is 30.74 kg/m.  Estimated Nutritional Needs:   Kcal:  1700-1900  Protein:  85-100 grams  Fluid:  1.7-1.9 L   Suzen HUNT RD, LDN, CNSC Contact via secure chat. If unavailable, use group chat RD Inpatient.

## 2024-01-28 NOTE — Progress Notes (Addendum)
 [facility] at bedside to transport patient for round trip to [facility] for PEG tube placement. [name] at bedside and aware.

## 2024-01-28 NOTE — Progress Notes (Signed)
 Progress Note   Patient: Connie Shepard FMW:[id number] DOB: 09-24-1965 DOA: 01/14/2024     14 DOS: the patient was seen and examined on 01/28/2024   Brief hospital course: Suni Jarnagin is a 58 y.o. female with medical history significant for stroke with baseline Right hemiparesis, DM II, CHF, HTN, hypothyroidism. Patient was brought to the ED via EMS with reports of unresponsiveness, right gaze, and aphasia.   Code stroke called.  Patient outside window for thrombolysis.   Head CT-suggested an acute infarct. Subsequent MRI brain - Acute infarcts in the right corona radiata, caudate, lentiform nucleus, and external capsule. Extensive chronic microvascular ischemic changes, generalized parenchymal volume loss, and multiple remote infarcts. CTA head and neck-severe stenosis in several arteries. Tele-neurology was consulted, okay for patient to be admitted here at [facility].  Neurology recommended: aspirin  and Plavix , x 3 months then continue with aspirin  325 daily. She failed swallow eval. She is awaiting PEG tube placement at [facility]. Dobhoff placed with left arm restraint, started meds, feeds. Awaiting to come off esmolol drip for her to go [facility] for PEG and subsequently to SNF upon dc.  Parents has been updated at bedside and in agreement with plan of care.   Assessment & Plan:   Principal Problem:   Acute CVA (cerebrovascular accident) [facility]) Active Problems:   Essential hypertension   Diabetes mellitus (HCC)   Hypothyroidism   Chronic combined systolic and diastolic congestive heart failure (HCC)    Assessment and Plan: Acute CVA- POA: History of stroke, with baseline right hemiparesis  - MRI brain-  Acute infarcts in the right corona radiata, caudate, lentiform nucleus, and external capsule. Extensive chronic microvascular ischemic changes, generalized parenchymal volume loss, and multiple remote infarcts.   - CTA head and neck reviewed stenosis in multiple vessels. -  Neurology consulted, recommended: aspirin  and Plavix , x 3 months then continue with aspirin  325 daily Remains aphasic, with right-sided hemiplegia, with right upper arm contracted Now moving left upper extremities, minimal left lower extremity. Severe dysphagia -status post extensive evaluation by speech advised PEG tube placement. She pulled her NG out several times. Restraints ordered with NG in place 01/22/24. Started diet, antihypertensive meds. Aspirin , Plavix  changed thru tube. Palliative care following, prognosis remain poor due to recurrent stroke-severe dysphagia. Family wished for DNR, if she declines leaning towards hospice. Awaiting PEG tube placement by IR prior to discharge to skilled nursing facility. Subsequent plan to discharge to SNF with PEG tube once in place. - Per report PEG tube placement later today 01/28/2024 by IR. - Follow results and adequately advance nutrition/tube feedings per dietitian once back in place.  Hypertension- uncontrolled Not controlled with IV hydralazine  to 20mg  q4 prn, continue clonidine patch. Labetolol IV for very high BP, Enalapril scheduled doses. Unable to give PO, as she has no NG tube, failed SLP. Unable to give Nifedipine drip due to QT prolong issues. Esmolol drip discontinue with stable blood pressure. Continue Dobbhoff for medications, feeds. increased Coreg  bid, Losartan  daily, spironolactone  and clonidine. - Will continue to follow blood pressure and further adjust as needed.   Type 2 Diabetes mellitus A1c 12/02/2023-8.4. Sugars stable. Continue accucheks, sliding scale q6h with tube feeds. Continue to hold metformin  - We continue sliding scale insulin  and the use of Lantus  as per diabetes coordinator recommendations.   Chronic combined systolic and diastolic CHF stable and compensated.   Last echo 2022 EF 20% with global hypokinesis Repeat 2D Echo: EF estimated 25%, severe decreased LV function, finding consistent  with Takotsubo  cardiomyopathy.  Severe LVH Gentle hydration to be stopped once feeds started. Continue torsemide  40 mg twice daily.  Ethics: Changed to DNR limited Palliative care consulted, mom and dad present at bedside.  Recurrent stroke, with progressive neurological changes on the new stroke complicated by aphasia, dysphagia-therefore prognosis is poor.  If patient continues to decline family may consider hospice.  Peg tube planned at [facility] with IR on 01/27/2024 now that blood pressure is controlled.  Nursing supportive care. Fall, aspiration precautions. Diet:  Diet Orders (From admission, onward)     Start     Ordered   01/27/24 0001  Diet NPO time specified  Diet effective midnight        01/26/24 1145           DVT prophylaxis: enoxaparin  (LOVENOX ) injection 40 mg Start: 01/23/24 1000 SCDs Start: 01/23/24 0820   Level of care: Stepdown   Code Status: Limited: Do not attempt resuscitation (DNR) -DNR-LIMITED -Do Not Intubate/DNI   Subjective:  No fever, no chest pain, no nausea, no vomiting.  No overnight events.  Currently no acute distress.  Physical Exam: Vitals:   01/28/24 1400 01/28/24 1430 01/28/24 1500 01/28/24 1530  BP: 117/77 113/70 97/64 103/63  Pulse: 66 67 69 72  Resp: 16 15 15 16   Temp:      TempSrc:      SpO2: 95% 94% 94% 95%  Weight:      Height:       General exam: Afebrile, no nausea, no vomiting, no chest pain and good saturation on room air. Respiratory system: Good air movement bilaterally. Cardiovascular system: Rate controlled, no rubs, no gallops, no JVD. Gastrointestinal system: Abdomen is obese, nondistended, soft and nontender. No organomegaly or masses felt. Normal bowel sounds heard. Central nervous system: No new focal neurologic deficits. Extremities: No cyanosis or clubbing. Skin: No petechiae. Psychiatry: Stable mood.  Data Reviewed:      Latest Ref Rng & Units 01/25/2024    3:28 AM 01/24/2024    3:31 AM 01/23/2024    4:53 AM  CBC  WBC  4.0 - 10.5 K/uL 8.2  6.5  7.2   Hemoglobin 12.0 - 15.0 g/dL 83.4  84.4  84.2   Hematocrit 36.0 - 46.0 % 47.8  46.0  47.2   Platelets 150 - 400 K/uL 241  220  186       Latest Ref Rng & Units 01/27/2024    7:00 AM 01/23/2024    4:53 AM 01/20/2024    7:49 AM  BMP  Glucose 70 - 99 mg/dL 821  97  867   BUN 6 - 20 mg/dL 48  19  13   Creatinine 0.44 - 1.00 mg/dL 8.45  8.79  9.02   Sodium 135 - 145 mmol/L 138  139  143   Potassium 3.5 - 5.1 mmol/L 3.4  4.4  3.2   Chloride 98 - 111 mmol/L 96  105  107   CO2 22 - 32 mmol/L 29  20  23    Calcium  8.9 - 10.3 mg/dL 89.5  9.4  9.3    No results found.   Family Communication: Discussed with patient's parents at bedside.  They understand and agree. All questions answered.  (01/28/2024).  Disposition: Status is: Inpatient Remains inpatient appropriate because: NG for meds, feeds, now the patient blood pressure has been controlled off esmolol drip will add temp PEG tube placement by IR prior to skilled nursing facility discharge.   Planned Discharge  Destination: Skilled nursing facility  Time spent: 50 minutes  Author: Eric Nunnery, MD 01/28/2024 4:10 PM Secure chat 7am to 7pm For on call review [email or web address].

## 2024-01-29 DIAGNOSIS — I639 Cerebral infarction, unspecified: Secondary | ICD-10-CM | POA: Diagnosis not present

## 2024-01-29 LAB — GLUCOSE, CAPILLARY
Glucose-Capillary: 225 mg/dL — ABNORMAL HIGH (ref 70–99)
Glucose-Capillary: 232 mg/dL — ABNORMAL HIGH (ref 70–99)
Glucose-Capillary: 236 mg/dL — ABNORMAL HIGH (ref 70–99)
Glucose-Capillary: 243 mg/dL — ABNORMAL HIGH (ref 70–99)
Glucose-Capillary: 252 mg/dL — ABNORMAL HIGH (ref 70–99)

## 2024-01-29 MED ORDER — SENNOSIDES-DOCUSATE SODIUM 8.6-50 MG PO TABS: 1.0000 | ORAL_TABLET | Freq: Every evening | ORAL | 0 refills | Status: DC | PRN

## 2024-01-29 MED ORDER — ASPIRIN 81 MG PO CHEW: 81.0000 mg | CHEWABLE_TABLET | Freq: Every day | ORAL | 2 refills | Status: DC

## 2024-01-29 MED ORDER — CLOPIDOGREL BISULFATE 75 MG PO TABS: 75.0000 mg | ORAL_TABLET | Freq: Every day | ORAL | 0 refills | Status: DC

## 2024-01-29 MED ORDER — SPIRONOLACTONE 50 MG PO TABS: 50.0000 mg | ORAL_TABLET | Freq: Every day | ORAL | 1 refills | Status: DC

## 2024-01-29 MED ORDER — BUPROPION HCL 75 MG PO TABS: 75.0000 mg | ORAL_TABLET | Freq: Every morning | ORAL | 1 refills | Status: DC

## 2024-01-29 MED ORDER — INSULIN GLARGINE 100 UNIT/ML ~~LOC~~ SOLN
15.0000 [IU] | Freq: Every day | SUBCUTANEOUS | Status: DC
  Filled 2024-01-29: qty 0.15

## 2024-01-29 MED ORDER — AMLODIPINE BESYLATE 10 MG PO TABS: 10.0000 mg | ORAL_TABLET | Freq: Every day | ORAL | 1 refills | Status: DC

## 2024-01-29 MED ORDER — JEVITY 1.5 CAL/FIBER PO LIQD: 1000.0000 mL | ORAL | 1 refills | Status: DC

## 2024-01-29 MED ORDER — JEVITY 1.5 CAL/FIBER PO LIQD: 1000.0000 mL | ORAL | 3 refills | Status: DC

## 2024-01-29 MED ORDER — INSULIN GLARGINE 100 UNIT/ML ~~LOC~~ SOLN: 15.0000 [IU] | Freq: Every day | SUBCUTANEOUS | 11 refills | Status: DC

## 2024-01-29 MED ORDER — PROSOURCE TF20 ENFIT COMPATIBL EN LIQD: 60.0000 mL | Freq: Every day | ENTERAL | 2 refills | Status: DC

## 2024-01-29 MED ORDER — FREE WATER: 100.0000 mL | Freq: Two times a day (BID) | 0 refills | Status: DC

## 2024-01-29 MED ORDER — LOSARTAN POTASSIUM 100 MG PO TABS: 100.0000 mg | ORAL_TABLET | Freq: Every day | ORAL | 1 refills | Status: DC

## 2024-01-29 MED ORDER — ADULT MULTIVITAMIN W/MINERALS CH: 1.0000 | ORAL_TABLET | Freq: Every day | ORAL | 1 refills | Status: DC

## 2024-01-29 MED ORDER — CARVEDILOL 25 MG PO TABS: 25.0000 mg | ORAL_TABLET | Freq: Two times a day (BID) | ORAL | 2 refills | Status: DC

## 2024-01-29 MED ORDER — CLONIDINE 0.3 MG/24HR TD PTWK: 0.3000 mg | MEDICATED_PATCH | TRANSDERMAL | 12 refills | Status: DC

## 2024-01-29 MED ORDER — HYDRALAZINE HCL 50 MG PO TABS: 50.0000 mg | ORAL_TABLET | Freq: Three times a day (TID) | ORAL | 2 refills | Status: DC

## 2024-01-29 MED ORDER — ORAL CARE MOUTH RINSE: 15.0000 mL | OROMUCOSAL | 0 refills | Status: DC | PRN

## 2024-01-29 MED ORDER — ATORVASTATIN CALCIUM 40 MG PO TABS: 40.0000 mg | ORAL_TABLET | Freq: Every day | ORAL | 2 refills | Status: DC

## 2024-01-29 MED ORDER — ORAL CARE MOUTH RINSE: 15.0000 mL | Freq: Three times a day (TID) | OROMUCOSAL | 2 refills | Status: DC

## 2024-01-29 MED ORDER — OSMOLITE 1.5 CAL PO LIQD: 1000.0000 mL | ORAL | 1 refills | Status: DC

## 2024-01-29 MED ORDER — TORSEMIDE 60 MG PO TABS: 60.0000 mg | ORAL_TABLET | Freq: Every day | ORAL | 1 refills | Status: DC

## 2024-01-29 MED ORDER — LEVOTHYROXINE SODIUM 100 MCG PO TABS: 100.0000 ug | ORAL_TABLET | Freq: Every day | ORAL | 1 refills | Status: DC

## 2024-01-29 MED ORDER — ACETAMINOPHEN 160 MG/5ML PO SOLN: 650.0000 mg | ORAL | 0 refills | Status: DC | PRN

## 2024-01-29 NOTE — Progress Notes (Signed)
 Speech Language Pathology Treatment: Dysphagia  Patient Details Name: Connie Shepard MRN: [id number] DOB: 03-14-1966 Today's Date: 01/29/2024 Time: 8799-8784 SLP Time Calculation (min) (ACUTE ONLY): 15 min  Assessment / Plan / Recommendation Clinical Impression  Pt seen for dysphagia tx f/u session to assess po readiness with pt's mentation impaired c/b intermittent lethargy/inattention noted during all po trials.  She consumed nectar-thickened liquids, puree and ice chips with oral retention/holding, delay in the initiation of the swallow, delayed throat clearing and audible swallow across consistencies with mod verbal/tactile cues provided by SLP.  Pt has PEG in place for nutrition/hydration purposes, but ST following for MBS readiness.  Discussed plan with pt's parents who were in agreement for MBS completion.  MBS scheduled for next date pending imminent d/c to SNF.  If MBS is unable to be completed, recommend f/u with MBS for diet initiation d/t progress made with dysphagia tx.  Recommend pt continue dysphagia tx at SNF with MBS completion as pt able/schedule permits.  HPI HPI: Connie Shepard is a 58 y.o. female with medical history significant for stroke with baseline right hemiparesis, diabetes mellitus, CHF, hypertension, hypothyroidism. Patient was brought to the ED via EMS with reports of unresponsiveness, right gaze, and aphasia. Family reported that patient was last known well at about 8 PM last night when she went to bed. This morning when she woke up she is not feeling well and was not talking much at all, it was about 12:30 PM after she woke up from a nap and I did notice that she was not talking at all. EMS noted that she was gazing to the right on the following directions. BSE recommendations for NPO. ST f/u for speech/language and dysphagia tx. PEG pending d/t medical concenrs (elevated BP) delaying procedure. ST f/u for dysphagia management/tx.      SLP Plan  MBS           Recommendations  Diet recommendations: NPO Medication Administration: Via alternative means                  Oral care QID;Oral care prior to ice chip/H20   Frequent or constant Supervision/Assistance Dysphagia, oropharyngeal phase (R13.12)     MBS     Pat Farmer Mccahill,M.S.,CCC-SLP  01/29/2024, 12:38 PM

## 2024-01-29 NOTE — Inpatient Diabetes Management (Signed)
 Inpatient Diabetes Program Recommendations  AACE/ADA: New Consensus Statement on Inpatient Glycemic Control   Target Ranges:  Prepandial:   less than 140 mg/dL      Peak postprandial:   less than 180 mg/dL (1-2 hours)      Critically ill patients:  140 - 180 mg/dL    Latest Reference Range & Units 01/29/24 03:00 01/29/24 06:34 01/29/24 07:45  Glucose-Capillary 70 - 99 mg/dL 763 (H) 756 (H) 774 (H)    Latest Reference Range & Units 01/28/24 06:16 01/28/24 13:38 01/28/24 17:20 01/28/24 19:42 01/28/24 21:31 01/28/24 23:53  Glucose-Capillary 70 - 99 mg/dL 815 (H) 744 (H) 715 (H) 252 (H) 274 (H) 252 (H)   Review of Glycemic Control  Diabetes history: DM2 Outpatient Diabetes medications: Novolog  0-20 units TID with meals, Metformin  500 mg QAM Current orders for Inpatient glycemic control: Lantus  10 units at bedtime, Novolog  0-9 units Q6H, Novolog  4 units Q4H; Osmolite @ 45 ml/hr   Inpatient Diabetes Program Recommendations:     Insulin : Please consider changing CBGs to Q4H, Novolog  0-9 units to Q4H, increasing tube feeding coverage to Novolog  6 units Q4H, and increase Lantus  to 15 units QHS. If tube feeding is stopped or held then Novolog  tube feeding coverage should also be stopped or held.   Thanks, Earnie Gainer, RN, MSN, CDCES Diabetes Coordinator Inpatient Diabetes Program [phone] (Team Pager from 8am to 5pm)

## 2024-01-29 NOTE — TOC Transition Note (Signed)
 Transition of Care [facility]) - Discharge Note   Patient Details  Name: Connie Shepard MRN: [id number] Date of Birth: 12-18-1965  Transition of Care [facility]) CM/SW Contact:  Sharlyne Stabs, RN Phone Number: 01/29/2024, 12:44 PM   Clinical Narrative:   Shara Hose, DC summary completed, MD discharging to [facility]. Family at the bedside. DC summary sent, Isaiah provided room number and number for report.  RN calling report. EMS scheduled.    Final next level of care: Skilled Nursing Facility Barriers to Discharge: Barriers Resolved   Patient Goals and CMS Choice Patient states their goals for this hospitalization and ongoing recovery are:: SNF CMS Medicare.gov Compare Post Acute Care list provided to:: Patient Represenative (must comment) (parents) Choice offered to / list presented to : Parent [place] ownership interest in [facility].provided to:: Parent NA    Discharge Placement              Patient chooses bed at: Other - please specify in the comment section below: [facility]) Patient to be transferred to facility by: EMS Name of family member notified: Family at the bedside Patient and family notified of of transfer: 01/29/24  Discharge Plan and Services Additional resources added to the After Visit Summary for   In-house Referral: Clinical Social Work   Post Acute Care Choice: Skilled Nursing Facility               Social Drivers of Health (SDOH) Interventions SDOH Screenings   Food Insecurity: No Food Insecurity (01/14/2024)  Housing: Low Risk  (01/14/2024)  Transportation Needs: No Transportation Needs (01/14/2024)  Utilities: Not At Risk (01/14/2024)  Depression (PHQ2-9): Medium Risk (04/30/2018)  Financial Resource Strain: Low Risk  (05/07/2023)   Received from [facility]  Physical Activity: Unknown (05/07/2023)   Received from [facility]  Social Connections: Somewhat Isolated (05/07/2023)   Received from [facility]  Stress: No Stress  Concern Present (05/07/2023)   Received from [facility]  Tobacco Use: Low Risk  (01/17/2024)  Health Literacy: Medium Risk (01/10/2021)   Received from [facility]     Readmission Risk Interventions    01/24/2024   10:02 AM 01/23/2024   10:20 AM 01/22/2024    1:43 PM  Readmission Risk Prevention Plan  Transportation Screening Complete Complete Complete  Home Care Screening  Complete Complete  Medication Review (RN CM)  Complete Complete  HRI or Home Care Consult Complete    Social Work Consult for Recovery Care Planning/Counseling Complete    Palliative Care Screening Complete    Medication Review Oceanographer) Complete

## 2024-01-29 NOTE — Progress Notes (Signed)
 Physical Therapy Treatment Patient Details Name: Connie Shepard MRN: [id number] DOB: October 15, 1965 Today's Date: 01/29/2024   History of Present Illness Connie Shepard is a 58 y.o. female with medical history significant for stroke with baseline right hemiparesis, diabetes mellitus, CHF, hypertension, hypothyroidism.  Patient was brought to the ED via EMS with reports of unresponsiveness, right gaze, and aphasia.  Family reported that patient was last known well at about 8 PM last night when she went to bed.  This morning when she woke up she is not feeling well and was not talking much at all, it was about 12:30 PM after she woke up from a nap and I did notice that she was not talking at all.  EMS noted that she was gazing to the right on the following directions.  On my evaluation, patient is awake, but gazing to the right and unable to answer questions.    PT Comments  Patient demonstrates slow labored movement for sitting up at bedside, once seated frequent leaning falling to the right requiring pillows to keep patient in midline, unable to stand using left hand on RW and required Max assist stand pivot to transfer to chair. Patient tolerated sitting up in chair after therapy with family present. Patient will benefit from continued skilled physical therapy in hospital and recommended venue below to increase strength, balance, endurance for safe ADLs and gait.       If plan is discharge home, recommend the following: A lot of help with bathing/dressing/bathroom;Help with stairs or ramp for entrance;A lot of help with walking and/or transfers;Assistance with cooking/housework;Assist for transportation   Can travel by private vehicle     No  Equipment Recommendations  None recommended by PT    Recommendations for Other Services       Precautions / Restrictions Precautions Precautions: Fall Recall of Precautions/Restrictions: Impaired Restrictions Weight Bearing Restrictions Per Provider  Order: No     Mobility  Bed Mobility Overal bed mobility: Needs Assistance Bed Mobility: Supine to Sit Rolling: Max assist, Used rails   Supine to sit: Max assist, HOB elevated, Total assist Sit to supine: Max assist, Total assist   General bed mobility comments: increased time, labored movement with limited use of right side    Transfers Overall transfer level: Needs assistance Equipment used: 1 person hand held assist Transfers: Sit to/from Stand, Bed to chair/wheelchair/BSC Sit to Stand: Max assist Stand pivot transfers: Max assist         General transfer comment: unable to stand using RW, required Max assist stand pivot with knees blocked    Ambulation/Gait                   Stairs             Wheelchair Mobility     Tilt Bed    Modified Rankin (Stroke Patients Only)       Balance Overall balance assessment: Needs assistance Sitting-balance support: Feet unsupported, Single extremity supported Sitting balance-Leahy Scale: Poor Sitting balance - Comments: seated at EOB, frequent leaning forward and R lateral lean. Holding on to RW with L UE for stability. Postural control: Right lateral lean Standing balance support: Single extremity supported, During functional activity Standing balance-Leahy Scale: Poor Standing balance comment: Poor to zero with therapist assisting.                            Communication Communication Communication: Impaired Factors Affecting Communication: Difficulty expressing  self  Cognition Arousal: Lethargic Behavior During Therapy: Flat affect                             Following commands: Impaired Following commands impaired: Follows one step commands inconsistently, Follows one step commands with increased time    Cueing Cueing Techniques: Gestural cues, Tactile cues, Verbal cues  Exercises General Exercises - Lower Extremity Long Arc Quad: 10 reps Other Exercises Other  Exercises: Active assisted left hip, knee, ankle flexion/extension x 10 reps, Passive ROM to right hip, knees, ankles x 10 reps each Other Exercises: Active assisted BLE hip flexion/abduction x 10 reps each    General Comments        Pertinent Vitals/Pain Pain Assessment Pain Assessment: Faces Pain Score: 0-No pain    Home Living                          Prior Function            PT Goals (current goals can now be found in the care plan section) Acute Rehab PT Goals Patient Stated Goal: not stated. Family wants patient to return home after rehab PT Goal Formulation: With patient Time For Goal Achievement: 01/29/24 Potential to Achieve Goals: Fair Progress towards PT goals: Progressing toward goals    Frequency    Min 3X/week      PT Plan      Co-evaluation PT/OT/SLP Co-Evaluation/Treatment: Yes Reason for Co-Treatment: To address functional/ADL transfers;Complexity of the patient's impairments (multi-system involvement) PT goals addressed during session: Mobility/safety with mobility;Balance;Proper use of DME OT goals addressed during session: ADL's and self-care;Strengthening/ROM      AM-PAC PT 6 Clicks Mobility   Outcome Measure  Help needed turning from your back to your side while in a flat bed without using bedrails?: A Lot Help needed moving from lying on your back to sitting on the side of a flat bed without using bedrails?: A Lot Help needed moving to and from a bed to a chair (including a wheelchair)?: A Lot Help needed standing up from a chair using your arms (e.g., wheelchair or bedside chair)?: Total Help needed to walk in hospital room?: Total Help needed climbing 3-5 steps with a railing? : Total 6 Click Score: 9    End of Session   Activity Tolerance: Patient tolerated treatment well Patient left: in chair;with family/visitor present;with call bell/phone within reach Nurse Communication: Mobility status PT Visit Diagnosis:  Unsteadiness on feet (R26.81);Other abnormalities of gait and mobility (R26.89);Muscle weakness (generalized) (M62.81)     Time: 9173-9149 PT Time Calculation (min) (ACUTE ONLY): 24 min  Charges:    $Therapeutic Exercise: 8-22 mins $Therapeutic Activity: 8-22 mins PT General Charges $$ ACUTE PT VISIT: 1 Visit                     12:37 PM, 01/29/24 Lynwood Music, MPT Physical Therapist with [facility] [phone] office [phone] mobile phone

## 2024-01-29 NOTE — Progress Notes (Signed)
 Occupational Therapy Treatment Patient Details Name: Connie Shepard MRN: [id number] DOB: [birth year] Today's Date: 01/29/2024   History of present illness Connie Shepard is a 58 y.o. female with medical history significant for stroke with baseline right hemiparesis, diabetes mellitus, CHF, hypertension, hypothyroidism.  Patient was brought to the ED via EMS with reports of unresponsiveness, right gaze, and aphasia.  Family reported that patient was last known well at about 8 PM last night when she went to bed.  This morning when she woke up she is not feeling well and was not talking much at all, it was about 12:30 PM after she woke up from a nap and I did notice that she was not talking at all.  EMS noted that she was gazing to the right on the following directions.  On my evaluation, patient is awake, but gazing to the right and unable to answer questions.   OT comments  Pt lethargic initially but able to wake with eyes open for supine to sit at EOB. Pt able to assist with bed mobility pulling up to sit with L UE. Pt tolerated stand pivot transfer to chair with max A +2. Pt positioned in the chair with pillows due to R lateral lean. Pt left in the chair with family present. Pt will benefit from continued OT in the hospital to increase strength, balance, and endurance for safe ADL's.         If plan is discharge home, recommend the following:  Two people to help with walking and/or transfers;Two people to help with bathing/dressing/bathroom;A lot of help with bathing/dressing/bathroom;Assistance with cooking/housework;Direct supervision/assist for medications management;Assistance with feeding;Assist for transportation;Help with stairs or ramp for entrance   Equipment Recommendations  None recommended by OT          Precautions / Restrictions Precautions Precautions: Fall Recall of Precautions/Restrictions: Impaired Restrictions Weight Bearing Restrictions Per Provider Order: No        Mobility Bed Mobility Overal bed mobility: Needs Assistance Bed Mobility: Supine to Sit     Supine to sit: Max assist, HOB elevated, Total assist     General bed mobility comments: labored movement; assisted to sit up using L UE hand held assist.    Transfers Overall transfer level: Needs assistance Equipment used: 1 person hand held assist Transfers: Sit to/from Stand, Bed to chair/wheelchair/BSC Sit to Stand: Max assist Stand pivot transfers: Max assist         General transfer comment: EOB to chair; OT supporting R UE during transfer.     Balance Overall balance assessment: Needs assistance Sitting-balance support: Feet unsupported, Single extremity supported Sitting balance-Leahy Scale: Poor Sitting balance - Comments: seated at EOB, frequent leaning forward and R lateral lean. Holding on to RW with L UE for stability. Postural control: Right lateral lean Standing balance support: Single extremity supported, During functional activity Standing balance-Leahy Scale: Poor Standing balance comment: Poor to zero with therapist assisting.                           ADL either performed or assessed with clinical judgement   ADL Overall ADL's : Needs assistance/impaired                     Lower Body Dressing: Total assistance;Bed level Lower Body Dressing Details (indicate cue type and reason): Assist to don socks while pt was supine in bed today. Toilet Transfer: Maximal assistance;+2 for physical assistance;Stand-pivot Statistician Details (indicate  cue type and reason): EOB to chair                Extremity/Trunk Assessment Upper Extremity Assessment Upper Extremity Assessment: Left hand dominant;RUE deficits/detail RUE Deficits / Details: no change in status LUE Deficits / Details: No change from evaluation. Toleratetd P/ROM of shoulder flexion well; reaching and grasping randomly throughout session.   Lower Extremity Assessment Lower  Extremity Assessment: Defer to PT evaluation        Vision   Vision Assessment?: Vision impaired- to be further tested in functional context Additional Comments: Difficult to assess; Pt did gaze to L side some today during P/ROM.   Perception Perception Perception: Not tested   Praxis Praxis Praxis: Not tested   Communication Communication Communication: Impaired Factors Affecting Communication: Difficulty expressing self   Cognition Arousal: Lethargic Behavior During Therapy: Flat affect Cognition: Cognition impaired             OT - Cognition Comments: Pt still unable to communicate today.                 Following commands: Impaired Following commands impaired: Follows one step commands inconsistently      Cueing   Cueing Techniques: Gestural cues, Tactile cues, Verbal cues  Exercises General Exercises - Upper Extremity Shoulder Flexion: PROM, Left, 10 reps, Supine                 Pertinent Vitals/ Pain       Pain Assessment Pain Assessment: Faces Faces Pain Scale: No hurt                                                          Frequency  Min 3X/week        Progress Toward Goals  OT Goals(current goals can now be found in the care plan section)  Progress towards OT goals: Progressing toward goals  Acute Rehab OT Goals Patient Stated Goal: Go to rehab. OT Goal Formulation: With family Time For Goal Achievement: 01/29/24 Potential to Achieve Goals: Fair ADL Goals Pt Will Perform Grooming: with mod assist;sitting Pt Will Perform Upper Body Bathing: with mod assist;sitting Pt Will Perform Upper Body Dressing: with mod assist;sitting Pt Will Perform Lower Body Dressing: with mod assist;with adaptive equipment;bed level Pt Will Transfer to Toilet: with max assist;stand pivot transfer;with mod assist Pt/caregiver will Perform Home Exercise Program: Increased ROM;Increased strength;Both right and left upper  extremity;With minimal assist  Plan      Co-evaluation    PT/OT/SLP Co-Evaluation/Treatment: Yes Reason for Co-Treatment: To address functional/ADL transfers;Complexity of the patient's impairments (multi-system involvement)   OT goals addressed during session: ADL's and self-care;Strengthening/ROM                          End of Session Equipment Utilized During Treatment: Rolling walker (2 wheels)  OT Visit Diagnosis: Unsteadiness on feet (R26.81);Other abnormalities of gait and mobility (R26.89);Muscle weakness (generalized) (M62.81);Other symptoms and signs involving the nervous system (R29.898);Other symptoms and signs involving cognitive function;Cognitive communication deficit (R41.841);Hemiplegia and hemiparesis Symptoms and signs involving cognitive functions: Cerebral infarction Hemiplegia - Right/Left: Right Hemiplegia - dominant/non-dominant: Dominant Hemiplegia - caused by: Cerebral infarction   Activity Tolerance Patient tolerated treatment well   Patient Left in chair;with call bell/phone within reach;with family/visitor  present             Time: 0829-0851 OT Time Calculation (min): 22 min  Charges: OT General Charges $OT Visit: 1 Visit (No additional charge due to co-treat.)  JAYSON PERSON OT, MOT  JAYSON PERSON 01/29/2024, 10:42 AM

## 2024-01-29 NOTE — Care Management Important Message (Signed)
 Important Message  Patient Details  Name: Connie Shepard MRN: [id number] Date of Birth: 1966-02-25   Important Message Given:  Yes - Medicare IM     [name] 01/29/2024, 2:28 PM

## 2024-01-29 NOTE — Plan of Care (Signed)

## 2024-01-29 NOTE — Discharge Summary (Addendum)
 Physician Discharge Summary   Patient: Connie Shepard MRN: [id number] DOB: [birth year]  Admit date:     01/14/2024  Discharge date: 01/29/24  Discharge Physician: Adriana DELENA Grams   PCP: Corrington, Kip A, MD   Recommendations at discharge:   Follow-up with the palliative care team as soon as possible Up to chair daily, move in bed and reposition every 2 hours-precaution for avoiding pressure ulcers Elevate head of the bed 30 degrees at all times Avoid aspiration-aspiration precaution - Hold tube feeds briefly if any coughing, stomach bloating, no any signs of intolerance - Status post chest tube placement 01/28/2024-continue tube feeds as directed - Anticipating titrating BP meds per PEG tube for better blood pressure control  Discharge Diagnoses: Principal Problem:   Acute CVA (cerebrovascular accident) (HCC) Active Problems:   Essential hypertension   Diabetes mellitus (HCC)   Hypothyroidism   Chronic combined systolic and diastolic congestive heart failure (HCC)   Malnutrition of moderate degree   Hospital Course: Connie Shepard is a 58 y.o. female with medical history significant for stroke with baseline Right hemiparesis, DM II, CHF, HTN, hypothyroidism. Patient was brought to the ED via EMS with reports of unresponsiveness, right gaze, and aphasia.   Code stroke called.  Patient outside window for thrombolysis.   Head CT-suggested an acute infarct. Subsequent MRI brain - Acute infarcts in the right corona radiata, caudate, lentiform nucleus, and external capsule. Extensive chronic microvascular ischemic changes, generalized parenchymal volume loss, and multiple remote infarcts. CTA head and neck-severe stenosis in several arteries. Tele-neurology was consulted, okay for patient to be admitted here at [facility].   Neurology recommended: aspirin  and Plavix , x 3 months then continue with aspirin  325 daily. She failed swallow eval. She is awaiting PEG tube placement at [facility]  campus. Dobhoff placed with left arm restraint, started meds, feeds. Awaiting to come off esmolol drip for her to go [facility] for PEG and subsequently to SNF upon dc.   Parents has been updated at bedside and in agreement with plan of care.      Acute CVA- POA: History of stroke, with baseline right hemiparesis  - MRI brain-  Acute infarcts in the right corona radiata, caudate, lentiform nucleus, and external capsule. Extensive chronic microvascular ischemic changes, generalized parenchymal volume loss, and multiple remote infarcts.   - CTA head and neck reviewed stenosis in multiple vessels. - Neurology consulted, recommended: aspirin  and Plavix , x 3 months then continue with aspirin  325 daily Remains aphasic, with right-sided hemiplegia, with right upper arm contracted Now moving left upper extremities, minimal left lower extremity. Severe dysphagia -status post extensive evaluation by speech advised PEG tube placement. She pulled her NG out several times. Restraints ordered with NG in place 01/22/24. Started diet, antihypertensive meds. Aspirin , Plavix  changed thru tube. Palliative care following, prognosis remain poor due to recurrent stroke-severe dysphagia. Family wished for DNR, if she declines leaning towards hospice. Awaiting PEG tube placement by IR prior to discharge to skilled nursing facility. Subsequent plan to discharge to SNF with PEG tube once in place. - PEG tube placement 01/28/2024 by IR. - Continue tube feeds as directed   Hypertension- uncontrolled Not controlled with IV hydralazine  to 20mg  q4 prn, continue clonidine patch. Labetolol IV for very high BP, Enalapril scheduled doses. NPO - failed SLP. Unable to give Nifedipine drip due to QT prolong issues. Esmolol drip discontinue with stable blood pressure. Per tube -   Coreg  bid, Losartan  daily, spironolactone  and clonidine. - Continue to adjust current  medication for optimal blood pressure control   Type 2  Diabetes mellitus A1c 12/02/2023-8.4. Sugars stable. Continue accucheks, sliding scale q6h with tube feeds. Discontinue  metformin  -Continue sliding scale insulin  and the use of Lantus  as per diabetes coordinator recommendations.   Chronic combined systolic and diastolic CHF stable and compensated.   Last echo 2022 EF 20% with global hypokinesis Repeat 2D Echo: EF estimated 25%, severe decreased LV function, finding consistent with Takotsubo cardiomyopathy.  Severe LVH Gentle hydration to be stopped once feeds started. Continue torsemide  40 mg twice daily.   Ethics: Changed to DNR limited Palliative care consulted, mom and dad present at bedside.  Recurrent stroke, with progressive neurological changes on the new stroke complicated by aphasia, dysphagia-therefore prognosis is poor.  If patient continues to decline family may consider hospice.  Peg tube         Consultants: Neurology, TOC/palliative care team Procedures performed: PEG tube placement 01/28/2024, imaging per stroke protocol recommendation Disposition: SNF with palliative care Diet recommendation:  N.p.o.-tube feeds only per PEG tube DISCHARGE MEDICATION: Allergies as of 01/29/2024       Reactions   Clevidipine Hives, Rash   Rash on chest and infusion arm shortly after starting infusion   Hydroxyzine  Other (See Comments)   Bad dreams        Medication List     STOP taking these medications    aspirin  EC 81 MG tablet Replaced by: aspirin  81 MG chewable tablet   metFORMIN  500 MG tablet Commonly known as: GLUCOPHAGE    potassium chloride  SA 20 MEQ tablet Commonly known as: KLOR-CON  M       TAKE these medications    acetaminophen  160 MG/5ML solution Commonly known as: TYLENOL  Place 20.3 mLs (650 mg total) into feeding tube every 4 (four) hours as needed for mild pain (pain score 1-3) or fever (or temp > 37.5 C (99.5 F)).   amLODipine 10 MG tablet Commonly known as: NORVASC Place 1 tablet (10 mg  total) into feeding tube daily. Start taking on: January 30, 2024   aspirin  81 MG chewable tablet Place 1 tablet (81 mg total) into feeding tube daily. Start taking on: January 30, 2024 Replaces: aspirin  EC 81 MG tablet   atorvastatin  40 MG tablet Commonly known as: LIPITOR Place 1 tablet (40 mg total) into feeding tube daily. Start taking on: January 30, 2024 What changed: how to take this   buPROPion  75 MG tablet Commonly known as: WELLBUTRIN  Place 1 tablet (75 mg total) into feeding tube every morning. What changed:  how to take this Another medication with the same name was removed. Continue taking this medication, and follow the directions you see here.   carvedilol  25 MG tablet Commonly known as: COREG  Place 1 tablet (25 mg total) into feeding tube 2 (two) times daily with a meal. What changed:  medication strength how much to take how to take this   cloNIDine 0.3 mg/24hr patch Commonly known as: CATAPRES - Dosed in mg/24 hr Place 1 patch (0.3 mg total) onto the skin once a week. Start taking on: January 31, 2024   clopidogrel  75 MG tablet Commonly known as: PLAVIX  Place 1 tablet (75 mg total) into feeding tube daily. Start taking on: January 30, 2024 What changed: how to take this   escitalopram  20 MG tablet Commonly known as: LEXAPRO  Take 1 tablet (20 mg total) by mouth daily.   feeding supplement (JEVITY 1.5 CAL/FIBER) Liqd Place 1,000 mLs into feeding tube continuous. @ 45  ml/hr   feeding supplement (PROSource TF20) liquid Place 60 mLs into feeding tube daily. Start taking on: January 30, 2024   free water Soln Place 100 mLs into feeding tube every 12 (twelve) hours.   hydrALAZINE  50 MG tablet Commonly known as: APRESOLINE  Place 1 tablet (50 mg total) into feeding tube every 8 (eight) hours.   insulin  aspart 100 UNIT/ML injection Commonly known as: novoLOG  Inject 0-9 Units into the skin every 6 (six) hours. What changed:  how much to take when  to take this   insulin  glargine 100 UNIT/ML injection Commonly known as: LANTUS  Inject 0.15 mLs (15 Units total) into the skin at bedtime.   levothyroxine  100 MCG tablet Commonly known as: SYNTHROID  Place 1 tablet (100 mcg total) into feeding tube daily at 6 (six) AM. Start taking on: January 30, 2024 What changed: how to take this   losartan  100 MG tablet Commonly known as: COZAAR  Place 1 tablet (100 mg total) into feeding tube daily. Start taking on: January 30, 2024 What changed:  medication strength how much to take how to take this   mouth rinse Liqd solution 15 mLs by Mouth Rinse route every 8 (eight) hours.   mouth rinse Liqd solution 15 mLs by Mouth Rinse route as needed (oral care).   multivitamin with minerals Tabs tablet Place 1 tablet into feeding tube daily. Start taking on: January 30, 2024   ondansetron  4 MG tablet Commonly known as: ZOFRAN  Take 4 mg by mouth every 8 (eight) hours as needed.   senna-docusate 8.6-50 MG tablet Commonly known as: Senokot-S Place 1 tablet into feeding tube at bedtime as needed for mild constipation or moderate constipation.   spironolactone  50 MG tablet Commonly known as: ALDACTONE  Place 1 tablet (50 mg total) into feeding tube daily. Start taking on: January 30, 2024 What changed:  medication strength how much to take how to take this   Torsemide  60 MG Tabs 60 mg by Per NG tube route daily. Start taking on: January 30, 2024 What changed:  medication strength how much to take how to take this when to take this        Contact information for after-discharge care     Destination     [facility] .   Service: Skilled Nursing Contact information: [address] [facility] [place]  [phone]                    Discharge Exam: Fredricka Weights   01/27/24 0400 01/28/24 0436 01/29/24 0459  Weight: 81 kg 83.8 kg 87.1 kg        General:  Somnolent nonverbal, interactive at  times when fully awake  HEENT:  Limited exam, nonverbal normocephalic, PERRL, otherwise with in Normal limits   Neuro:  Limited exam nonverbal, chronic right-sided weakness from previous stroke, with a right upper extremity contracture  Aphasic now -with dysphagia unable to swallow Limited exam-CNII-XII .  Moving left lower and upper extremity oriented  Lungs:   Clear to auscultation BL, Respirations unlabored,  No wheezes / crackles  Cardio:    S1/S2, RRR, No murmure, No Rubs or Gallops   Abdomen:  Soft, nontender, PEG tube in place  Muscular  skeletal:  Limited exam: Aphasic, dense hemiplegia, nonverbal Limited exam minimal left-sided strength mainly bedbound now  Skin:  Dry, warm to touch, negative for any Rashes,  Wounds: Abdominal PEG tube in place          Condition at discharge: fair  The results of significant diagnostics from this hospitalization (including imaging, microbiology, ancillary and laboratory) are listed below for reference.   Imaging Studies: IR GASTROSTOMY TUBE MOD SED Result Date: 01/28/2024 INDICATION: Cerebrovascular accident and dysphagia. Planned placement of a percutaneous gastrostomy tube for nutritional support EXAM: Gastrostomy tube placement MEDICATIONS: 2 g Ancef; Antibiotics were administered within 1 hour of the procedure. Glucagon 1 mg IV ANESTHESIA/SEDATION: Moderate (conscious) sedation was employed during this procedure. A total of Versed 0.5 mg and Fentanyl 25 mcg was administered intravenously by the radiology nurse. Total intra-service moderate Sedation Time: 13 minutes. The patient's level of consciousness and vital signs were monitored continuously by radiology nursing throughout the procedure under my direct supervision. CONTRAST:  15 mL Omnipaque  300-administered into the gastric lumen. FLUOROSCOPY: Radiation Exposure Index (as provided by the fluoroscopic device): 16 mGy Kerma COMPLICATIONS: None immediate. PROCEDURE: Informed written consent  was obtained from the patient after a thorough discussion of the procedural risks, benefits and alternatives. All questions were addressed. Maximal Sterile Barrier Technique was utilized including caps, mask, sterile gowns, sterile gloves, sterile drape, hand hygiene and skin antiseptic. A timeout was performed prior to the initiation of the procedure. In the supine position, the epigastric region was evaluated with fluoroscopy in the nasogastric tube was inflated with room air. When the stomach was insufficient position for access, 2 gastropexy sutures were deployed in serial fashion by infiltrating subcutaneous tissue with 1% lidocaine  and then advancing the gastropexy needle from the skin incision to the intraluminal position within the stomach. Intraluminal position was verified by withdrawing gas into a syringe and tripping contrast onto the dorsal wall of the stomach. A larger incision was then made after further injection of 1% lidocaine . An 18 gauge needle was then advanced under fluoroscopic guidance and again intraluminal position was verified by withdrawing gas into a syringe and injecting contrast under fluoroscopy. An Amplatz wire was then advanced into the stomach lumen. The access site was then dilated and the 18 gauge gastrostomy tube was advanced through the peel-away sheath over the guidewire and into the stomach lumen. Intraluminal position was again verified by withdrawing gas into a syringe and injecting contrast under fluoroscopic guidance. The catheter was then flushed with normal saline. The gastropexy sutures were locked in position. The [employer] disc was closely applied to the patient's skin. Sterile dressing applied. IMPRESSION: Satisfactory placement of an 41 French gastrostomy tube as described above. Electronically Signed   By: Cordella Banner   On: 01/28/2024 17:08   DG CHEST PORT 1 VIEW Result Date: 01/26/2024 CLINICAL DATA:  Nasogastric tube placement. EXAM: PORTABLE CHEST 1 VIEW  COMPARISON:  Abdominal film 01/22/2024 FINDINGS: Mild cardiac enlargement. Low bilateral lung volumes with chronic elevation of the right hemidiaphragm. No overt pulmonary edema, airspace disease, pneumothorax or pleural fluid. Nasogastric tube is coiled in the fundus of the stomach. IMPRESSION: Nasogastric tube coiled in the fundus of the stomach. Electronically Signed   By: Marcey Moan M.D.   On: 01/26/2024 17:06   DG Abd Portable 1V Result Date: 01/22/2024 CLINICAL DATA:  Nasogastric tube placement. EXAM: PORTABLE ABDOMEN - 1 VIEW COMPARISON:  Radiograph 01/20/2024 FINDINGS: Tip and side port of the enteric tube below the diaphragm in the stomach. Nonobstructive upper abdominal bowel gas pattern. IMPRESSION: Tip and side port of the enteric tube below the diaphragm in the stomach. Electronically Signed   By: Andrea Gasman M.D.   On: 01/22/2024 15:08   US  EKG SITE RITE Result Date: 01/22/2024 If Site  Rite image not attached, placement could not be confirmed due to current cardiac rhythm.  DG Abd Portable 1V Result Date: 01/20/2024 CLINICAL DATA:  [id number] Encounter for nasogastric (NG) tube placement [id number] EXAM: PORTABLE ABDOMEN - 1 VIEW COMPARISON:  X-ray abdomen 01/17/2024 FINDINGS: Enteric tube with tip and side port overlying the expected region of the gastric lumen. The bowel gas pattern is normal. No radio-opaque calculi or other significant radiographic abnormality are seen. IMPRESSION: 1. Enteric tube in good position. 2. Nonobstructive bowel gas pattern. Electronically Signed   By: Morgane  Naveau M.D.   On: 01/20/2024 17:06   DG Abd 1 View Result Date: 01/18/2024 CLINICAL DATA:  NG tube EXAM: ABDOMEN - 1 VIEW COMPARISON:  01/17/2024 FINDINGS: Upper gas pattern is unremarkable. No enteric tube is visualized within the included portions of the chest or upper abdomen. IMPRESSION: No enteric tube is visualized within the included portions of the chest or upper abdomen. Electronically Signed    By: Luke Bun M.D.   On: 01/18/2024 00:51   DG [place] MATSU Tube Plc W/Fl W/Rad Result Date: 01/17/2024 INDICATION: Dysphagia secondary to acute stroke.  Dobbhoff placement requested. EXAM: NASO G TUBE PLACEMENT WITH FL AND WITH RAD FLUOROSCOPY TIME:  Radiation Exposure Index (as provided by the fluoroscopic device): 4.6 mGy Kerma COMPLICATIONS: None immediate PROCEDURE: The Dobbhoff tube was lubricated with viscous lidocaine  inserted into the right nostril. Under intermittent fluoroscopic guidance, the Dobbhoff tube was advanced through the stomach with tip in the stomach body. 20 mL injection of contrast confirms position a spot fluoroscopic image was saved for documentation purposes. The tube was affixed to the patient's nose with tape. The patient tolerated the procedure well without immediate postprocedural complication. IMPRESSION: Successful fluoroscopic guided placement of Dobbhoff tube with tip terminating in the body of the stomach. The tube is ready for immediate use. This exam was performed by Solmon Ku PA-C, and was supervised and interpreted by Wilkie Lent, MD. Electronically Signed   By: Wilkie Lent M.D.   On: 01/17/2024 15:55   CT ABDOMEN WO CONTRAST Result Date: 01/17/2024 CLINICAL DATA:  Evaluate anatomy for gastrostomy tube placement. EXAM: CT ABDOMEN WITHOUT CONTRAST TECHNIQUE: Multidetector CT imaging of the abdomen was performed following the standard protocol without IV contrast. RADIATION DOSE REDUCTION: This exam was performed according to the departmental dose-optimization program which includes automated exposure control, adjustment of the mA and/or kV according to patient size and/or use of iterative reconstruction technique. COMPARISON:  CT 06/27/2020 FINDINGS: Lower chest: Volume loss in left lower lobe. Hepatobiliary: Mild distension in the gallbladder. Normal appearance of the liver. Pancreas: Unremarkable. No pancreatic ductal dilatation or surrounding  inflammatory changes. Spleen: Normal in size without focal abnormality. Adrenals/Urinary Tract: Normal adrenal glands. High-density material in the right renal pelvis compatible with recent intravascular contrast administration. No hydronephrosis. Stomach/Bowel: Normal appearance of the stomach. There are no structures interposed between the stomach and the anterior abdominal wall. Anatomy is amendable for percutaneous gastrostomy tube placement. Moderate amount of stool in the visualized colon. No evidence for bowel dilatation or obstruction. Vascular/Lymphatic: Atherosclerotic disease in the abdominal aorta without aneurysm. No significant lymph node enlargement in the abdomen. Other: Negative for ascites. Musculoskeletal: Progressive endplate disease involving the superior endplate L5 with a Schmorl's node. Disc space narrowing at L5-S1. IMPRESSION: 1. No structures interposed between the stomach and the anterior abdominal wall. Anatomy is amendable for percutaneous gastrostomy tube placement. 2. No acute abnormality in the abdomen. 3. Aortic Atherosclerosis (ICD10-I70.0). Electronically Signed  By: Juliene Balder M.D.   On: 01/17/2024 08:36   ECHOCARDIOGRAM COMPLETE Result Date: 01/15/2024    ECHOCARDIOGRAM REPORT   Patient Name:   JENYA PUTZ Date of Exam: 01/15/2024 Medical Rec #:  [id number]    Height:       65.0 in Accession #:    [id number]   Weight:       183.2 lb Date of Birth:  December 02, 1965   BSA:          1.906 m Patient Age:    57 years     BP:           182/123 mmHg Patient Gender: F            HR:           88 bpm. Exam Location:  [facility] Procedure: 2D Echo, Cardiac Doppler, Color Doppler and Intracardiac            Opacification Agent (Both Spectral and Color Flow Doppler were            utilized during procedure). Indications:    Stroke  History:        Patient has prior history of Echocardiogram examinations, most                 recent 08/25/2020. CHF, Stroke; Risk Factors:Hypertension and                  Diabetes.  Sonographer:    Ellouise Mose RDCS Referring Phys: [id number] TULLY BRAVO [facility]  Sonographer Comments: Technically difficult study due to poor echo windows and patient is obese. Image acquisition challenging due to patient body habitus. IMPRESSIONS  1. Left ventricular ejection fraction, by estimation, is 25%. The left ventricle has severely decreased function. The left ventricle demonstrates regional wall motion abnormalities (see scoring diagram/findings for description) consistent with likely Takotsubo cardiomyopathy although echo images are suboptimal despite Definity . No LV thrombus. There is severe concentric left ventricular hypertrophy of the septal segment. Left ventricular diastolic parameters are indeterminate.  2. Right ventricular systolic function is mildly reduced. The right ventricular size is normal. Tricuspid regurgitation signal is inadequate for assessing PA pressure.  3. The mitral valve is grossly normal. No evidence of mitral valve regurgitation. No evidence of mitral stenosis.  4. The aortic valve was not well visualized. Aortic valve regurgitation is not visualized. No aortic stenosis is present.  5. The inferior vena cava is normal in size with greater than 50% respiratory variability, suggesting right atrial pressure of 3 mmHg. FINDINGS  Left Ventricle: Left ventricular ejection fraction, by estimation, is 25%. The left ventricle has severely decreased function. The left ventricle demonstrates regional wall motion abnormalities. Definity  contrast agent was given IV to delineate the left  ventricular endocardial borders. Strain was performed and the global longitudinal strain is indeterminate. The left ventricular internal cavity size was normal in size. There is severe concentric left ventricular hypertrophy of the septal segment. Left ventricular diastolic parameters are indeterminate.  LV Wall Scoring: The antero-lateral wall, posterior wall, mid inferoseptal segment, basal  anterior segment, basal inferior segment, and basal inferoseptal segment are akinetic. The mid and distal anterior wall, entire anterior septum, entire apex, and mid and distal inferior wall are hypokinetic. Right Ventricle: The right ventricular size is normal. No increase in right ventricular wall thickness. Right ventricular systolic function is mildly reduced. Tricuspid regurgitation signal is inadequate for assessing PA pressure. Left Atrium: Left atrial size was normal in size. Right Atrium: Right  atrial size was normal in size. Pericardium: There is no evidence of pericardial effusion. Mitral Valve: The mitral valve is grossly normal. No evidence of mitral valve regurgitation. No evidence of mitral valve stenosis. Tricuspid Valve: The tricuspid valve is normal in structure. Tricuspid valve regurgitation is not demonstrated. No evidence of tricuspid stenosis. Aortic Valve: The aortic valve was not well visualized. Aortic valve regurgitation is not visualized. No aortic stenosis is present. Pulmonic Valve: The pulmonic valve was not well visualized. Pulmonic valve regurgitation is not visualized. No evidence of pulmonic stenosis. Aorta: The aortic root and ascending aorta are structurally normal, with no evidence of dilitation. Venous: The inferior vena cava is normal in size with greater than 50% respiratory variability, suggesting right atrial pressure of 3 mmHg. IAS/Shunts: No atrial level shunt detected by color flow Doppler. Additional Comments: 3D was performed not requiring image post processing on an independent workstation and was indeterminate.  LEFT VENTRICLE PLAX 2D LVIDd:         4.00 cm     Diastology LVIDs:         3.80 cm     LV e' medial:   4.79 cm/s LV PW:         2.00 cm     LV E/e' medial: 11.9 LV IVS:        1.60 cm LVOT diam:     2.10 cm LV SV:         44 LV SV Index:   23 LVOT Area:     3.46 cm  LV Volumes (MOD) LV vol d, MOD A2C: 77.5 ml LV vol d, MOD A4C: 97.5 ml LV vol s, MOD A2C:  62.3 ml LV vol s, MOD A4C: 58.7 ml LV SV MOD A2C:     15.2 ml LV SV MOD A4C:     97.5 ml LV SV MOD BP:      25.7 ml RIGHT VENTRICLE            IVC RV S prime:     8.05 cm/s  IVC diam: 1.20 cm TAPSE (M-mode): 1.1 cm LEFT ATRIUM             Index        RIGHT ATRIUM          Index LA diam:        2.30 cm 1.21 cm/m   RA Area:     7.44 cm LA Vol (A2C):   24.1 ml 12.65 ml/m  RA Volume:   10.20 ml 5.35 ml/m LA Vol (A4C):   21.2 ml 11.12 ml/m LA Biplane Vol: 23.8 ml 12.49 ml/m  AORTIC VALVE LVOT Vmax:   86.50 cm/s LVOT Vmean:  51.400 cm/s LVOT VTI:    0.126 m  AORTA Ao Root diam: 3.00 cm Ao Asc diam:  3.60 cm MITRAL VALVE MV Area (PHT): 5.38 cm    SHUNTS MV Decel Time: 141 msec    Systemic VTI:  0.13 m MV E velocity: 57.10 cm/s  Systemic Diam: 2.10 cm MV A velocity: 74.40 cm/s MV E/A ratio:  0.77 Vishnu Priya Mallipeddi Electronically signed by Diannah Late Mallipeddi Signature Date/Time: 01/15/2024/2:09:36 PM    Final    MR BRAIN WO CONTRAST Result Date: 01/14/2024 EXAM: MRI BRAIN WITHOUT CONTRAST 01/14/2024 02:27:37 PM TECHNIQUE: Multiplanar multisequence MRI of the head/brain was performed without the administration of intravenous contrast. COMPARISON: Same day CT head and CTA head and neck. CLINICAL HISTORY: Neuro deficit, acute, stroke suspected. Neuro deficit, acute.  FINDINGS: BRAIN AND VENTRICLES: Linear focus of acute infarct extending from the right corona radiata and body of the caudate into the posterior aspect of the lentiform nucleus. Additional small focus of acute infarct within the right external capsule. Extensive chronic microvascular ischemic changes. Generalized parenchymal volume loss. Remote infarcts in the pons. Multiple remote lacunar infarcts in the basal ganglia and thalami. Small remote infarcts in the bilateral cerebellum. Additional small remote lacunar infarcts in the right external capsule. Encephalomalacia in the left occipital lobe suggestive of remote infarct. 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 There are multiple scattered areas of susceptibility in the bilateral cerebral hemispheres with additional areas in the bilateral basal ganglia and thalami and in the left cerebellum. The distribution of chronic microhemorrhages suggests cerebral amyloid angiopathy versus possible component of hypertensive encephalopathy. No intracranial hemorrhage. No mass. No midline shift. No hydrocephalus. The sella is unremarkable. Normal flow voids. ORBITS: No acute abnormality. SINUSES AND MASTOIDS: No acute abnormality. BONES AND SOFT TISSUES: Normal marrow signal. No acute soft tissue abnormality. IMPRESSION: 1. Acute infarcts in the right corona radiata, caudate, lentiform nucleus, and external capsule. 2. Extensive chronic microvascular ischemic changes, generalized parenchymal volume loss, and multiple remote infarcts. 3. 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 Electronically signed by: Donnice Mania MD 01/14/2024 02:43 PM EDT RP Workstation: [id number]   CT ANGIO HEAD NECK W WO CM (CODE STROKE) Result Date: 01/14/2024 EXAM: CTA Head and Neck with Intravenous Contrast. CT Head without Contrast. CLINICAL HISTORY: Stroke, follow up. Pt BIB ems for CODE STROKE. Per EMS pt was LKN 1230 by caregiver. Pt has hx of previous strokes with rt arm deficits. Pt has rt gaze, rt side facial droop, aphasic. Per EMS pt is normally talking and A \\T \ O times 4. TECHNIQUE: Axial CTA images of the head and neck performed with intravenous contrast. MIP reconstructed images were created and reviewed. Axial computed tomography images of the head/brain performed without intravenous contrast. Note: Per PQRS, the description of internal carotid artery percent stenosis, including 0 percent or normal exam, is based on [place] American Symptomatic Carotid  Endarterectomy Trial (NASCET) criteria. Dose reduction technique was used including one or more of the following: automated exposure control, adjustment of mA and kV according to patient size, and/or iterative reconstruction. CONTRAST: Without and with; 75 mL iohexol  (OMNIPAQUE ) 350 MG/ML injection. COMPARISON: CT head dated 01/14/2024 and MRI dated 08/24/2020. FINDINGS: CT HEAD: BRAIN: No acute intraparenchymal hemorrhage. No mass lesion. No CT evidence for acute territorial infarct. No midline shift or extra-axial collection. VENTRICLES: No hydrocephalus. ORBITS: The orbits are unremarkable. SINUSES AND MASTOIDS: The paranasal sinuses and mastoid air cells are clear. CTA NECK: COMMON CAROTID ARTERIES: There is mild atherosclerosis at the right carotid bifurcation without hemodynamically significant stenosis. No dissection or occlusion. INTERNAL CAROTID ARTERIES: No stenosis by NASCET criteria. No dissection or occlusion. VERTEBRAL ARTERIES: Atherosclerosis involving the V4 segments of both vertebral arteries. There is mild stenosis of the proximal right V4 segment and mild to moderate stenosis of the proximal left V4 segment. No dissection or occlusion. CTA HEAD: ANTERIOR CEREBRAL ARTERIES: The anterior cerebral arteries are patent bilaterally. No significant stenosis. No occlusion. No aneurysm. MIDDLE CEREBRAL ARTERIES: The middle cerebral arteries are patent bilaterally. There is mild atherosclerotic irregularity of multiple M2 branches of the right MCA. Severe stenosis of a proximal M2 branch of the left MCA. Additional moderate stenosis of a  distal M2/proximal M3 branch of the left MCA within the posterior aspect of the left MCA territory. No aneurysm. POSTERIOR CEREBRAL ARTERIES: There is severe stenosis of the P1 segment right PCA with occlusion of the right P2 segment which appears similar to the prior MRI. There are minimal distal branches of the right PCA visualized. Similar severe stenosis of the  proximal P2 segment of the left PCA. There is tapering and likely occlusion of the distal P2 segment of the left PCA similar to the prior MRI. Minimal branches of the left PCA visualized. No aneurysm. BASILAR ARTERY: No significant stenosis. No occlusion. No aneurysm. OTHER: The intracranial internal carotid arteries are patent bilaterally. There is atherosclerosis of the carotid siphons. Mild stenosis of the bilateral supraclinoid ICA, slightly greater on the right. SOFT TISSUES: No acute finding. No masses or lymphadenopathy. BONES: Lucency surrounding an unerupted right mandibular molar. No acute osseous abnormality. IMPRESSION: 1. No evidence of acute large vessel occlusion. 2. 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 Severe stenosis of the proximal P2 segment of the left PCA with tapering and likely occlusion of the distal P2 segment, similar to prior MRA. 3. Severe stenosis of a proximal M2 branch and additional moderate stenosis of a distal M2/proximal M3 branch of the left MCA. 4. Mild atherosclerotic irregularity of multiple M2 branches of the right MCA. Electronically signed by: Donnice Mania MD 01/14/2024 02:17 PM EDT RP Workstation: [id number]   CT HEAD CODE STROKE WO CONTRAST Result Date: 01/14/2024 CLINICAL DATA:  Code stroke. Neuro deficit, acute, stroke suspected. EXAM: CT HEAD WITHOUT CONTRAST TECHNIQUE: Contiguous axial images were obtained from the base of the skull through the vertex without intravenous contrast. RADIATION DOSE REDUCTION: This exam was performed according to the departmental dose-optimization program which includes automated exposure control, adjustment of the mA and/or kV according to patient size and/or use of iterative reconstruction technique. COMPARISON:  Brain MRI 08/24/2020.  Head CT 08/24/2020. FINDINGS: Brain: Generalized cerebral and cerebellar atrophy. 17 mm infarct within the thalamus and posterior  limb of internal capsule on the left, new from the prior head CT of 08/24/2020. Known chronic lacunar infarcts elsewhere within the genu and posterior limb of left internal capsule, and within the right thalamus. Redemonstrated chronic cortical/subcortical infarct within the right occipital lobe. Chronic cortical/subcortical infarct within the left occipital lobe, new from the prior head CT. Background moderate patchy and ill-defined hypoattenuation within the cerebral white matter, nonspecific but compatible chronic small vessel ischemic disease. There is no acute intracranial hemorrhage. No extra-axial fluid collection. No evidence of an intracranial mass. No midline shift. Vascular: No hyperdense vessel.  Atherosclerotic calcifications. Skull: No calvarial fracture or aggressive osseous lesion. Sinuses/Orbits: No mass or acute finding within the imaged orbits. No significant paranasal sinus disease at the imaged levels. ASPECTS [facility] Stroke Program Early CT Score) - Ganglionic level infarction (caudate, lentiform nuclei, internal capsule, insula, M1-M3 cortex): 6 - Supraganglionic infarction (M4-M6 cortex): 3 Total score (0-10 with 10 being normal): 9 (when discounting chronic infarcts). Impression #1 communicated to Dr. Voncile at 2:00 pmon 9/30/2025by text page via the [facility] messaging system. IMPRESSION: 1. 17 mm infarct within the thalamus and posterior limb of internal capsule on the left, new from prior exams and potentially acute. Consider a brain MRI for further evaluation. 2. Background parenchymal atrophy, chronic small vessel ischemic disease and chronic infarcts, as described. Electronically Signed   By: Rockey Childs D.O.  On: 01/14/2024 14:01    Microbiology: Results for orders placed or performed during the hospital encounter of 01/14/24  MRSA Next Gen by PCR, Nasal     Status: None   Collection Time: 01/14/24  4:32 PM   Specimen: Nasal Mucosa; Nasal Swab  Result Value Ref Range Status    MRSA by PCR Next Gen NOT DETECTED NOT DETECTED Final    Comment: (NOTE) The GeneXpert MRSA Assay (FDA approved for NASAL specimens only), is one component of a comprehensive MRSA colonization surveillance program. It is not intended to diagnose MRSA infection nor to guide or monitor treatment for MRSA infections. Test performance is not FDA approved in patients less than 60 years old. Performed at [facility], [address] [place], [place] [address]     Labs: CBC: Recent Labs  Lab 01/23/24 0453 01/24/24 0331 01/25/24 0328  WBC 7.2 6.5 8.2  HGB 15.7* 15.5* 16.5*  HCT 47.2* 46.0 47.8*  MCV 94.4 91.8 90.4  PLT 186 220 241   Basic Metabolic Panel: Recent Labs  Lab 01/23/24 0453 01/27/24 0700  NA 139 138  K 4.4 3.4*  CL 105 96*  CO2 20* 29  GLUCOSE 97 178*  BUN 19 48*  CREATININE 1.20* 1.54*  CALCIUM  9.4 10.4*  MG 2.1 2.3  PHOS 4.4 2.4*   Liver Function Tests: Recent Labs  Lab 01/23/24 0453 01/27/24 0700  AST 11* 13*  ALT 9 9  ALKPHOS 81 79  BILITOT 0.7 0.6  PROT 6.4* 7.4  ALBUMIN 3.4* 3.7   CBG: Recent Labs  Lab 01/28/24 2353 01/29/24 0300 01/29/24 0634 01/29/24 0745 01/29/24 1134  GLUCAP 252* 236* 243* 225* 232*    Discharge time spent: greater than 45 minutes.  Signed: Adriana DELENA Grams, MD [facility] 01/29/2024

## 2024-01-29 NOTE — Progress Notes (Signed)
 Patient to discharge to [facility]. Report called to [phone]. Awaiting EMS for transport. Both parents at bedside, made aware.

## 2024-03-16 DEATH — deceased
